# Patient Record
Sex: Male | Born: 1935 | Race: White | Hispanic: No | Marital: Single | Smoking: Never smoker
Health system: Southern US, Community
[De-identification: ages and names within clinical notes are randomized; demographics above are authoritative.]

## PROBLEM LIST (undated history)

## (undated) DIAGNOSIS — R7303 Prediabetes: Secondary | ICD-10-CM

## (undated) DIAGNOSIS — C4491 Basal cell carcinoma of skin, unspecified: Secondary | ICD-10-CM

## (undated) DIAGNOSIS — M2669 Other specified disorders of temporomandibular joint: Secondary | ICD-10-CM

## (undated) DIAGNOSIS — J189 Pneumonia, unspecified organism: Secondary | ICD-10-CM

## (undated) DIAGNOSIS — C099 Malignant neoplasm of tonsil, unspecified: Secondary | ICD-10-CM

## (undated) DIAGNOSIS — J309 Allergic rhinitis, unspecified: Secondary | ICD-10-CM

## (undated) DIAGNOSIS — Z8719 Personal history of other diseases of the digestive system: Secondary | ICD-10-CM

## (undated) DIAGNOSIS — H409 Unspecified glaucoma: Secondary | ICD-10-CM

## (undated) DIAGNOSIS — Z923 Personal history of irradiation: Secondary | ICD-10-CM

## (undated) DIAGNOSIS — H3530 Unspecified macular degeneration: Secondary | ICD-10-CM

## (undated) DIAGNOSIS — Z87898 Personal history of other specified conditions: Secondary | ICD-10-CM

## (undated) DIAGNOSIS — L589 Radiodermatitis, unspecified: Secondary | ICD-10-CM

## (undated) DIAGNOSIS — C4492 Squamous cell carcinoma of skin, unspecified: Secondary | ICD-10-CM

## (undated) DIAGNOSIS — N529 Male erectile dysfunction, unspecified: Secondary | ICD-10-CM

## (undated) DIAGNOSIS — M51379 Other intervertebral disc degeneration, lumbosacral region without mention of lumbar back pain or lower extremity pain: Secondary | ICD-10-CM

## (undated) DIAGNOSIS — I10 Essential (primary) hypertension: Secondary | ICD-10-CM

## (undated) DIAGNOSIS — M5030 Other cervical disc degeneration, unspecified cervical region: Secondary | ICD-10-CM

## (undated) DIAGNOSIS — R911 Solitary pulmonary nodule: Secondary | ICD-10-CM

## (undated) DIAGNOSIS — C679 Malignant neoplasm of bladder, unspecified: Secondary | ICD-10-CM

## (undated) DIAGNOSIS — M1990 Unspecified osteoarthritis, unspecified site: Secondary | ICD-10-CM

## (undated) DIAGNOSIS — Z9889 Other specified postprocedural states: Secondary | ICD-10-CM

## (undated) DIAGNOSIS — R4702 Dysphasia: Secondary | ICD-10-CM

## (undated) DIAGNOSIS — N400 Enlarged prostate without lower urinary tract symptoms: Secondary | ICD-10-CM

## (undated) DIAGNOSIS — M5137 Other intervertebral disc degeneration, lumbosacral region: Secondary | ICD-10-CM

## (undated) DIAGNOSIS — R600 Localized edema: Secondary | ICD-10-CM

## (undated) DIAGNOSIS — D630 Anemia in neoplastic disease: Secondary | ICD-10-CM

## (undated) DIAGNOSIS — I872 Venous insufficiency (chronic) (peripheral): Secondary | ICD-10-CM

## (undated) DIAGNOSIS — I712 Thoracic aortic aneurysm, without rupture: Secondary | ICD-10-CM

## (undated) DIAGNOSIS — Z22322 Carrier or suspected carrier of Methicillin resistant Staphylococcus aureus: Secondary | ICD-10-CM

## (undated) DIAGNOSIS — Z87442 Personal history of urinary calculi: Secondary | ICD-10-CM

## (undated) DIAGNOSIS — C062 Malignant neoplasm of retromolar area: Secondary | ICD-10-CM

## (undated) DIAGNOSIS — F1010 Alcohol abuse, uncomplicated: Secondary | ICD-10-CM

## (undated) DIAGNOSIS — C9110 Chronic lymphocytic leukemia of B-cell type not having achieved remission: Secondary | ICD-10-CM

## (undated) HISTORY — PX: HERNIA REPAIR: SHX51

## (undated) HISTORY — PX: CATARACT EXTRACTION W/ INTRAOCULAR LENS  IMPLANT, BILATERAL: SHX1307

## (undated) HISTORY — PX: BACK SURGERY: SHX140

## (undated) HISTORY — DX: Personal history of other specified conditions: Z87.898

## (undated) HISTORY — PX: EYE SURGERY: SHX253

## (undated) HISTORY — DX: Unspecified glaucoma: H40.9

## (undated) HISTORY — DX: Thoracic aortic aneurysm, without rupture: I71.2

## (undated) HISTORY — PX: INGUINAL HERNIA REPAIR: SUR1180

## (undated) HISTORY — PX: OTHER SURGICAL HISTORY: SHX169

## (undated) HISTORY — PX: MAXILLECTOMY: SUR858

---
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
## 2000-03-04 ENCOUNTER — Encounter: Payer: Self-pay | Admitting: Internal Medicine

## 2000-11-03 ENCOUNTER — Encounter: Payer: Self-pay | Admitting: Ophthalmology

## 2000-11-03 ENCOUNTER — Ambulatory Visit (HOSPITAL_COMMUNITY): Admission: RE | Admit: 2000-11-03 | Discharge: 2000-11-04 | Payer: Self-pay | Admitting: Ophthalmology

## 2000-11-03 HISTORY — PX: RETINAL DETACHMENT REPAIR W/ SCLERAL BUCKLE LE: SHX2338

## 2000-12-11 ENCOUNTER — Inpatient Hospital Stay (HOSPITAL_COMMUNITY): Admission: RE | Admit: 2000-12-11 | Discharge: 2000-12-12 | Payer: Self-pay | Admitting: Specialist

## 2000-12-11 HISTORY — PX: ROTATOR CUFF REPAIR: SHX139

## 2003-05-08 ENCOUNTER — Encounter: Admission: RE | Admit: 2003-05-08 | Discharge: 2003-05-08 | Payer: Self-pay | Admitting: Specialist

## 2003-06-07 ENCOUNTER — Ambulatory Visit: Admission: RE | Admit: 2003-06-07 | Discharge: 2003-06-07 | Payer: Self-pay | Admitting: Specialist

## 2003-06-08 ENCOUNTER — Encounter: Admission: RE | Admit: 2003-06-08 | Discharge: 2003-06-08 | Payer: Self-pay | Admitting: Specialist

## 2003-06-21 ENCOUNTER — Encounter: Admission: RE | Admit: 2003-06-21 | Discharge: 2003-06-21 | Payer: Self-pay | Admitting: Internal Medicine

## 2003-07-10 ENCOUNTER — Ambulatory Visit (HOSPITAL_COMMUNITY): Admission: RE | Admit: 2003-07-10 | Discharge: 2003-07-10 | Payer: Self-pay | Admitting: Internal Medicine

## 2003-07-10 ENCOUNTER — Encounter: Payer: Self-pay | Admitting: Internal Medicine

## 2003-07-26 ENCOUNTER — Ambulatory Visit (HOSPITAL_COMMUNITY): Admission: RE | Admit: 2003-07-26 | Discharge: 2003-07-26 | Payer: Self-pay | Admitting: Internal Medicine

## 2003-07-26 ENCOUNTER — Encounter (INDEPENDENT_AMBULATORY_CARE_PROVIDER_SITE_OTHER): Payer: Self-pay | Admitting: *Deleted

## 2003-09-01 ENCOUNTER — Inpatient Hospital Stay (HOSPITAL_COMMUNITY): Admission: RE | Admit: 2003-09-01 | Discharge: 2003-09-05 | Payer: Self-pay | Admitting: Specialist

## 2003-09-01 HISTORY — PX: LAMINECTOMY WITH POSTERIOR LATERAL ARTHRODESIS LEVEL 1: SHX6335

## 2003-09-08 ENCOUNTER — Inpatient Hospital Stay (HOSPITAL_COMMUNITY): Admission: EM | Admit: 2003-09-08 | Discharge: 2003-09-12 | Payer: Self-pay

## 2003-09-12 ENCOUNTER — Encounter: Payer: Self-pay | Admitting: Internal Medicine

## 2004-08-19 ENCOUNTER — Ambulatory Visit: Payer: Self-pay | Admitting: Internal Medicine

## 2005-02-17 ENCOUNTER — Ambulatory Visit: Payer: Self-pay | Admitting: Internal Medicine

## 2005-05-03 ENCOUNTER — Ambulatory Visit: Payer: Self-pay | Admitting: Internal Medicine

## 2005-05-20 ENCOUNTER — Ambulatory Visit: Payer: Self-pay | Admitting: Internal Medicine

## 2005-09-17 ENCOUNTER — Ambulatory Visit: Payer: Self-pay | Admitting: Internal Medicine

## 2006-03-11 ENCOUNTER — Ambulatory Visit: Payer: Self-pay | Admitting: Internal Medicine

## 2006-04-17 ENCOUNTER — Ambulatory Visit: Payer: Self-pay | Admitting: Internal Medicine

## 2006-09-09 ENCOUNTER — Ambulatory Visit: Payer: Self-pay | Admitting: Internal Medicine

## 2007-03-03 DIAGNOSIS — E119 Type 2 diabetes mellitus without complications: Secondary | ICD-10-CM | POA: Insufficient documentation

## 2007-03-03 DIAGNOSIS — I10 Essential (primary) hypertension: Secondary | ICD-10-CM | POA: Insufficient documentation

## 2007-03-03 DIAGNOSIS — N400 Enlarged prostate without lower urinary tract symptoms: Secondary | ICD-10-CM

## 2007-03-03 DIAGNOSIS — K219 Gastro-esophageal reflux disease without esophagitis: Secondary | ICD-10-CM

## 2007-03-09 ENCOUNTER — Encounter: Payer: Self-pay | Admitting: Internal Medicine

## 2007-03-10 ENCOUNTER — Ambulatory Visit: Payer: Self-pay | Admitting: Internal Medicine

## 2007-04-02 ENCOUNTER — Ambulatory Visit: Payer: Self-pay | Admitting: Internal Medicine

## 2007-06-09 ENCOUNTER — Ambulatory Visit: Payer: Self-pay | Admitting: Internal Medicine

## 2007-06-09 DIAGNOSIS — M1990 Unspecified osteoarthritis, unspecified site: Secondary | ICD-10-CM | POA: Insufficient documentation

## 2007-06-09 DIAGNOSIS — M545 Low back pain, unspecified: Secondary | ICD-10-CM | POA: Insufficient documentation

## 2007-06-09 DIAGNOSIS — N529 Male erectile dysfunction, unspecified: Secondary | ICD-10-CM | POA: Insufficient documentation

## 2007-10-08 ENCOUNTER — Ambulatory Visit: Payer: Self-pay | Admitting: Internal Medicine

## 2007-10-25 ENCOUNTER — Encounter: Payer: Self-pay | Admitting: Internal Medicine

## 2008-02-09 ENCOUNTER — Ambulatory Visit: Payer: Self-pay | Admitting: Internal Medicine

## 2008-02-09 DIAGNOSIS — J309 Allergic rhinitis, unspecified: Secondary | ICD-10-CM | POA: Insufficient documentation

## 2008-04-04 ENCOUNTER — Ambulatory Visit: Payer: Self-pay | Admitting: Internal Medicine

## 2008-08-09 ENCOUNTER — Ambulatory Visit: Payer: Self-pay | Admitting: Internal Medicine

## 2008-08-09 LAB — CONVERTED CEMR LAB
ALT: 27 units/L (ref 0–53)
AST: 32 units/L (ref 0–37)
Albumin: 3.8 g/dL (ref 3.5–5.2)
Alkaline Phosphatase: 61 units/L (ref 39–117)
BUN: 16 mg/dL (ref 6–23)
Basophils Absolute: 0.1 10*3/uL (ref 0.0–0.1)
Basophils Relative: 0.8 % (ref 0.0–3.0)
Bilirubin, Direct: 0.3 mg/dL (ref 0.0–0.3)
CO2: 33 meq/L — ABNORMAL HIGH (ref 19–32)
Calcium: 9.6 mg/dL (ref 8.4–10.5)
Chloride: 102 meq/L (ref 96–112)
Cholesterol: 189 mg/dL (ref 0–200)
Creatinine, Ser: 0.8 mg/dL (ref 0.4–1.5)
Eosinophils Absolute: 0.4 10*3/uL (ref 0.0–0.7)
Eosinophils Relative: 4 % (ref 0.0–5.0)
GFR calc Af Amer: 121 mL/min
GFR calc non Af Amer: 100 mL/min
Glucose, Bld: 109 mg/dL — ABNORMAL HIGH (ref 70–99)
HCT: 45.9 % (ref 39.0–52.0)
HDL: 66.5 mg/dL (ref >39.0)
Hemoglobin: 15.8 g/dL (ref 13.0–17.0)
Hgb A1c MFr Bld: 5.7 % (ref 4.6–6.0)
LDL Cholesterol: 115 mg/dL — ABNORMAL HIGH (ref 0–99)
Lymphocytes Relative: 50.2 % — ABNORMAL HIGH (ref 12.0–46.0)
MCHC: 34.5 g/dL (ref 30.0–36.0)
MCV: 99.5 fL (ref 78.0–100.0)
Monocytes Absolute: 0.9 10*3/uL (ref 0.1–1.0)
Monocytes Relative: 8.4 % (ref 3.0–12.0)
Neutro Abs: 3.7 10*3/uL (ref 1.4–7.7)
Neutrophils Relative %: 36.6 % — ABNORMAL LOW (ref 43.0–77.0)
PSA: 0.55 ng/mL (ref 0.10–4.00)
Platelets: 153 10*3/uL (ref 150–400)
Potassium: 4.5 meq/L (ref 3.5–5.1)
RBC: 4.62 M/uL (ref 4.22–5.81)
RDW: 12.5 % (ref 11.5–14.6)
Sodium: 143 meq/L (ref 135–145)
TSH: 1.07 microintl units/mL (ref 0.35–5.50)
Total Bilirubin: 1.3 mg/dL — ABNORMAL HIGH (ref 0.3–1.2)
Total CHOL/HDL Ratio: 2.8
Total Protein: 7 g/dL (ref 6.0–8.3)
Triglycerides: 37 mg/dL (ref 0–149)
VLDL: 7 mg/dL (ref 0–40)
WBC: 10.2 10*3/uL (ref 4.5–10.5)

## 2008-12-04 ENCOUNTER — Encounter: Payer: Self-pay | Admitting: Internal Medicine

## 2008-12-05 ENCOUNTER — Ambulatory Visit: Payer: Self-pay | Admitting: Internal Medicine

## 2009-03-22 ENCOUNTER — Ambulatory Visit: Payer: Self-pay | Admitting: Internal Medicine

## 2009-06-05 ENCOUNTER — Ambulatory Visit: Payer: Self-pay | Admitting: Internal Medicine

## 2009-06-05 LAB — CONVERTED CEMR LAB: Hgb A1c MFr Bld: 5.6 % (ref 4.6–6.5)

## 2009-12-12 ENCOUNTER — Ambulatory Visit: Payer: Self-pay | Admitting: Internal Medicine

## 2009-12-12 LAB — HM DIABETES FOOT EXAM

## 2010-03-14 ENCOUNTER — Ambulatory Visit: Payer: Self-pay | Admitting: Internal Medicine

## 2010-05-03 ENCOUNTER — Encounter: Payer: Self-pay | Admitting: Internal Medicine

## 2010-05-22 ENCOUNTER — Encounter: Admission: RE | Admit: 2010-05-22 | Discharge: 2010-05-22 | Payer: Self-pay | Admitting: Neurology

## 2010-06-13 ENCOUNTER — Ambulatory Visit: Payer: Self-pay | Admitting: Internal Medicine

## 2010-06-13 LAB — CONVERTED CEMR LAB: Blood Glucose, Fingerstick: 113

## 2010-07-03 ENCOUNTER — Encounter: Payer: Self-pay | Admitting: Internal Medicine

## 2010-07-21 LAB — CONVERTED CEMR LAB
ALT: 21 units/L (ref 0–53)
ALT: 30 units/L (ref 0–53)
AST: 29 units/L (ref 0–37)
AST: 42 units/L — ABNORMAL HIGH (ref 0–37)
Albumin: 3.8 g/dL (ref 3.5–5.2)
Albumin: 4.1 g/dL (ref 3.5–5.2)
Alkaline Phosphatase: 57 units/L (ref 39–117)
Alkaline Phosphatase: 60 units/L (ref 39–117)
BUN: 16 mg/dL (ref 6–23)
BUN: 8 mg/dL (ref 6–23)
Basophils Absolute: 0 10*3/uL (ref 0.0–0.1)
Basophils Absolute: 0.1 10*3/uL (ref 0.0–0.1)
Basophils Relative: 0 % (ref 0.0–1.0)
Basophils Relative: 1 % (ref 0–1)
Bilirubin, Direct: 0.2 mg/dL (ref 0.0–0.3)
Bilirubin, Direct: 0.2 mg/dL (ref 0.0–0.3)
CO2: 32 meq/L (ref 19–32)
CO2: 33 meq/L — ABNORMAL HIGH (ref 19–32)
Calcium: 9.3 mg/dL (ref 8.4–10.5)
Calcium: 9.3 mg/dL (ref 8.4–10.5)
Chloride: 103 meq/L (ref 96–112)
Chloride: 104 meq/L (ref 96–112)
Cholesterol: 173 mg/dL (ref 0–200)
Cholesterol: 178 mg/dL (ref 0–200)
Creatinine, Ser: 0.9 mg/dL (ref 0.4–1.5)
Creatinine, Ser: 1 mg/dL (ref 0.4–1.5)
Eosinophils Absolute: 0.3 10*3/uL (ref 0.0–0.6)
Eosinophils Absolute: 0.4 10*3/uL (ref 0.0–0.7)
Eosinophils Relative: 3.5 % (ref 0.0–5.0)
Eosinophils Relative: 4 % (ref 0–5)
GFR calc Af Amer: 106 mL/min
GFR calc non Af Amer: 81.57 mL/min (ref >60)
GFR calc non Af Amer: 87 mL/min
Glucose, Bld: 101 mg/dL — ABNORMAL HIGH (ref 70–99)
Glucose, Bld: 103 mg/dL — ABNORMAL HIGH (ref 70–99)
HCT: 43.2 % (ref 39.0–52.0)
HCT: 44.1 % (ref 39.0–52.0)
HDL: 67.2 mg/dL (ref >39.0)
HDL: 69.2 mg/dL (ref >39.00)
Hemoglobin: 14.7 g/dL (ref 13.0–17.0)
Hemoglobin: 15.4 g/dL (ref 13.0–17.0)
Hgb A1c MFr Bld: 5.6 % (ref 4.6–6.0)
Hgb A1c MFr Bld: 5.8 % (ref 4.6–6.5)
LDL Cholesterol: 101 mg/dL — ABNORMAL HIGH (ref 0–99)
LDL Cholesterol: 97 mg/dL (ref 0–99)
Lymphocytes Relative: 59 % — ABNORMAL HIGH (ref 12.0–46.0)
Lymphocytes Relative: 60 % — ABNORMAL HIGH (ref 12–46)
Lymphs Abs: 6.5 10*3/uL — ABNORMAL HIGH (ref 0.7–4.0)
MCHC: 34 g/dL (ref 30.0–36.0)
MCHC: 34.9 g/dL (ref 30.0–36.0)
MCV: 100.9 fL — ABNORMAL HIGH (ref 78.0–100.0)
MCV: 98.6 fL (ref 78.0–100.0)
Monocytes Absolute: 0.6 10*3/uL (ref 0.2–0.7)
Monocytes Absolute: 0.8 10*3/uL (ref 0.1–1.0)
Monocytes Relative: 6.9 % (ref 3.0–11.0)
Monocytes Relative: 8 % (ref 3–12)
Neutro Abs: 2.5 10*3/uL (ref 1.4–7.7)
Neutro Abs: 3.1 10*3/uL (ref 1.7–7.7)
Neutrophils Relative %: 29 % — ABNORMAL LOW (ref 43–77)
Neutrophils Relative %: 30.6 % — ABNORMAL LOW (ref 43.0–77.0)
PSA: 0.7 ng/mL (ref 0.10–4.00)
PSA: 0.77 ng/mL (ref 0.10–4.00)
Platelets: 169 10*3/uL (ref 150–400)
Platelets: 174 10*3/uL (ref 150–400)
Potassium: 4.2 meq/L (ref 3.5–5.1)
Potassium: 4.3 meq/L (ref 3.5–5.1)
RBC: 4.28 M/uL (ref 4.22–5.81)
RBC: 4.48 M/uL (ref 4.22–5.81)
RDW: 13 % (ref 11.5–14.6)
RDW: 14 % (ref 11.5–15.5)
Sodium: 142 meq/L (ref 135–145)
Sodium: 143 meq/L (ref 135–145)
TSH: 1.14 microintl units/mL (ref 0.35–5.50)
TSH: 2.37 microintl units/mL (ref 0.35–5.50)
Testosterone: 646.52 ng/dL (ref 350.00–890)
Total Bilirubin: 1.2 mg/dL (ref 0.3–1.2)
Total Bilirubin: 1.3 mg/dL — ABNORMAL HIGH (ref 0.3–1.2)
Total CHOL/HDL Ratio: 2.6
Total CHOL/HDL Ratio: 3
Total Protein: 6.7 g/dL (ref 6.0–8.3)
Total Protein: 7 g/dL (ref 6.0–8.3)
Triglycerides: 40 mg/dL (ref 0.0–149.0)
Triglycerides: 43 mg/dL (ref 0–149)
VLDL: 8 mg/dL (ref 0.0–40.0)
VLDL: 9 mg/dL (ref 0–40)
WBC: 10.8 10*3/uL — ABNORMAL HIGH (ref 4.0–10.5)
WBC: 8.3 10*3/uL (ref 4.5–10.5)

## 2010-07-23 NOTE — Assessment & Plan Note (Signed)
Summary: FLU SHOT//CCM  Nurse Visit   Allergies: No Known Drug Allergies  Orders Added: 1)  Flu Vaccine 3yrs + MEDICARE PATIENTS [Q2039] 2)  Administration Flu vaccine - MCR [G0008] Flu Vaccine Consent Questions     Do you have a history of severe allergic reactions to this vaccine? no    Any prior history of allergic reactions to egg and/or gelatin? no    Do you have a sensitivity to the preservative Thimersol? no    Do you have a past history of Guillan-Barre Syndrome? no    Do you currently have an acute febrile illness? no    Have you ever had a severe reaction to latex? no    Vaccine information given and explained to patient? yes    Are you currently pregnant? no    Lot Number:[id number]   Exp Date:12/21/2010   Site Given  Left Deltoid IM.lbmedflu 

## 2010-07-23 NOTE — Assessment & Plan Note (Signed)
Summary: pt will come in fasting/njr   Vital Signs:  Patient profile:   77 year old male Height:      68 inches Weight:      176 pounds BMI:     26.86 Temp:     97.7 degrees F oral BP sitting:   120 / 80  (right arm) Cuff size:   regular  Vitals Entered By: Duard Brady LPN (December 12, 2009 8:54 AM) CC: cpx - doing well  Is Patient Diabetic? No   CC:  cpx - doing well .  History of Present Illness: 77 year old patient who is seen today for a comprehensive evaluation.  He has a history of treated hypertension.  He has additionally, gastroesophageal reflux disease, and a history of diet-controlled diabetes.  He has osteoarthritis and chronic low back pain.  He has quite well except for his chronic low back pain. Here for Medicare AWV:  1.   Risk factors based on Past M, S, F history:  cardiovascular  risk factors include a history of hypertension and diet controlled diabetes 2.   Physical Activities: no rigorous exercise activities, but is active with yard work 3.   Depression/mood: no history of depression 4.   Hearing: no hearing deficits 5.   ADL's: completely active in all aspects of daily living 6.   Fall Risk: low 7.   Home Safety: no problems identified 8.   Height, weight, &visual acuity:height and weight are stable.  Has been recently diagnosed with glaucoma and is followed closely by ophthalmology 9.   Counseling: modest weight loss encouraged.  Screening colonoscopy.  Discussed 10.   Labs ordered based on risk factors: complete laboratory profile, including lipid panel, PSA will be reviewed 11.           Referral Coordination- will follow-up with his orthopedic physician due to his low back pain 12.           Care Plan- heart healthy diet modest weight loss and exercise regimen discussed.  Screening colonoscopy.  Also discussed 13.            Cognitive Assessment- alert and oriented, with normal affect   Preventive Screening-Counseling &  Management  Alcohol-Tobacco     Smoking Status: never  Allergies (verified): No Known Drug Allergies  Past History:  Past Medical History: Diabetes mellitus, type II GERD Hypertension Benign prostatic hypertrophy Low back pain Osteoarthritis history of EtOH Allergic rhinitis Glaucoma R Eye ([facility])  Past Surgical History: Inguinal herniorrhaphy x 2 Rotator cuff repair 2002 Flexible Sigmoidoscopy 20 yrs ago status post laminectomy March 2005 history retinal detachment Cataract extraction  Family History: Reviewed history from 12/05/2008 and no changes required. Family History of Alcoholism/Addiction Family History of Colon CA 1st degree relative <60 Family History Hypertension Family History of Sudden Death Father died age 24-unclear causes Mother died age 18 Lung Ca  One brother age 57-DJD One Sister died age 23- ETOH  Social History: Reviewed history from 12/05/2008 and no changes required. Retired Single Regular exercise-no Never Smoked MOD to heavy 2-3 per night  Review of Systems  The patient denies anorexia, fever, weight loss, weight gain, vision loss, decreased hearing, hoarseness, chest pain, syncope, dyspnea on exertion, peripheral edema, prolonged cough, headaches, hemoptysis, abdominal pain, melena, hematochezia, severe indigestion/heartburn, hematuria, incontinence, genital sores, muscle weakness, suspicious skin lesions, transient blindness, difficulty walking, depression, unusual weight change, abnormal bleeding, enlarged lymph nodes, angioedema, breast masses, and testicular masses.    Physical Exam  General:  overweight-appearing.  126/80 Head:  Normocephalic and atraumatic without obvious abnormalities. No apparent alopecia or balding. Eyes:  No corneal or conjunctival inflammation noted. EOMI. Perrla. Funduscopic exam benign, without hemorrhages, exudates or papilledema. Vision grossly normal. Ears:  External ear exam shows  no significant lesions or deformities.  Otoscopic examination reveals clear canals, tympanic membranes are intact bilaterally without bulging, retraction, inflammation or discharge. Hearing is grossly normal bilaterally. Nose:  External nasal examination shows no deformity or inflammation. Nasal mucosa are pink and moist without lesions or exudates. Mouth:  Oral mucosa and oropharynx without lesions or exudates.  Teeth in good repair. Neck:  No deformities, masses, or tenderness noted. Chest Wall:  No deformities, masses, tenderness or gynecomastia noted. Breasts:  No masses or gynecomastia noted Lungs:  Normal respiratory effort, chest expands symmetrically. Lungs are clear to auscultation, no crackles or wheezes. Heart:  Normal rate and regular rhythm. S1 and S2 normal without gallop, murmur, click, rub or other extra sounds. Abdomen:  Bowel sounds positive,abdomen soft and non-tender without masses, organomegaly or hernias noted. Rectal:  No external abnormalities noted. Normal sphincter tone. No rectal masses or tenderness. Genitalia:  Testes bilaterally descended without nodularity, tenderness or masses. No scrotal masses or lesions. No penis lesions or urethral discharge. Prostate:  2+ enlarged.  2+ enlarged.   Msk:  No deformity or scoliosis noted of thoracic or lumbar spine.   Pulses:  R and L carotid,radial,femoral,dorsalis pedis and posterior tibial pulses are full and equal bilaterally Extremities:  No clubbing, cyanosis, edema, or deformity noted with normal full range of motion of all joints.   Neurologic:  No cranial nerve deficits noted. Station and gait are normal. Plantar reflexes are down-going bilaterally. DTRs are symmetrical throughout.  absent vibratory sensation distal to the knees. Skin:  Intact without suspicious lesions or rashes Cervical Nodes:  No lymphadenopathy noted Axillary Nodes:  No palpable lymphadenopathy Inguinal Nodes:  No significant adenopathy Psych:   Cognition and judgment appear intact. Alert and cooperative with normal attention span and concentration. No apparent delusions, illusions, hallucinations  Diabetes Management Exam:    Foot Exam (with socks and/or shoes not present):       Sensory-Pinprick/Light touch:          Left medial foot (L-4): diminished          Left dorsal foot (L-5): diminished          Left lateral foot (S-1): diminished          Right medial foot (L-4): diminished          Right dorsal foot (L-5): diminished          Right lateral foot (S-1): diminished       Sensory-Monofilament:          Left foot: normal          Right foot: normal       Sensory-other: absent vibratory sensation distal to the knees       Inspection:          Left foot: normal          Right foot: normal       Nails:          Left foot: normal          Right foot: normal    Foot Exam by Podiatrist:       Date: 12/12/2009       Results: mild diabetic findings  Done by: PCP   Impression & Recommendations:  Problem # 1:  HEALTH SCREENING (ICD-V70.0)  Orders: First annual wellness visit with prevention plan  (Z6109) Venipuncture (60454) TLB-Lipid Panel (80061-LIPID) TLB-BMP (Basic Metabolic Panel-BMET) (80048-METABOL) TLB-CBC Platelet - w/Differential (85025-CBCD) TLB-Hepatic/Liver Function Pnl (80076-HEPATIC) TLB-TSH (Thyroid Stimulating Hormone) (84443-TSH) TLB-PSA (Prostate Specific Antigen) (84153-PSA)  Complete Medication List: 1)  Cialis 20 Mg Tabs (Tadalafil) .... As dir 2)  Metoprolol Succinate 100 Mg Tb24 (Metoprolol succinate) .Marland Kitchen.. 1 once daily 3)  Eq Aspirin 500 Mg Tbec (Aspirin) .... Two qam 4)  Benazepril-hydrochlorothiazide 20-12.5 Mg Tabs (Benazepril-hydrochlorothiazide) .Marland Kitchen.. 1 once daily 5)  Fish Oil Oil (Fish oil) .... Two qam 6)  Fluticasone Propionate 50 Mcg/act Susp (Fluticasone propionate) .... Use daily 7)  Famvir 125 Mg Tabs (Famciclovir) .... Qd 8)  Combigan 0.2-0.5 % Soln (Brimonidine  tartrate-timolol) .... Once daily (r) eye 9)  Prednisolone Acetate 1 % Susp (Prednisolone acetate) .... Once daily (r) eye  Other Orders: EKG w/ Interpretation (93000) TLB-A1C / Hgb A1C (Glycohemoglobin) (83036-A1C)  Patient Instructions: 1)  Please schedule a follow-up appointment in 6 months. 2)  Limit your Sodium (Salt). 3)  It is important that you exercise regularly at least 20 minutes 5 times a week. If you develop chest pain, have severe difficulty breathing, or feel very tired , stop exercising immediately and seek medical attention. 4)  You need to lose weight. Consider a lower calorie diet and regular exercise.  5)  Schedule a colonoscopy/sigmoidoscopy to help detect colon cancer. 6)  It is important that your Diabetic A1c level is checked every 3 months. 7)  Check your Blood Pressure regularly. If it is above: 150/90 you should make an appointment. Prescriptions: METOPROLOL SUCCINATE 100 MG  TB24 (METOPROLOL SUCCINATE) 1 once daily  #90 Tablet x 6   Entered and Authorized by:   Gordy Savers  MD   Signed by:   Gordy Savers  MD on 12/12/2009   Method used:   Electronically to        [facility]  [phone]* (retail)       [address] [place], [place]  [address]       Ph: [phone] or [phone]       Fax: [phone]   RxID:   [id number] FLUTICASONE PROPIONATE 50 MCG/ACT  SUSP (FLUTICASONE PROPIONATE) use daily  #3 x 6   Entered and Authorized by:   Gordy Savers  MD   Signed by:   Gordy Savers  MD on 12/12/2009   Method used:   Electronically to        [facility]  [phone]* (retail)       [address] [place], [place]  [address]       Ph: [phone] or [phone]       Fax: [phone]   RxID:   [id number] BENAZEPRIL-HYDROCHLOROTHIAZIDE 20-12.5 MG  TABS (BENAZEPRIL-HYDROCHLOROTHIAZIDE) 1 once daily  #90 Tablet x 6   Entered and Authorized by:   Gordy Savers  MD   Signed by:   Gordy Savers  MD on 12/12/2009   Method used:   Electronically to        [facility]  [phone]* (retail)       [address] [place], [place]  [address]       Ph: [phone] or [phone]  Fax: [phone]   RxID:   [id number] CIALIS 20 MG TABS (TADALAFIL) as dir  #6 x 12   Entered and Authorized by:   Gordy Savers  MD   Signed by:   Gordy Savers  MD on 12/12/2009   Method used:   Electronically to        [facility]  [phone]* (retail)       [address] [place], [place]  [address]       Ph: [phone] or [phone]       Fax: [phone]   RxID:   [id number]

## 2010-07-23 NOTE — Letter (Signed)
Summary: [facility]  [facility]   Imported By: Maryln Gottron 05/22/2010 14:13:01  _____________________________________________________________________  External Attachment:    Type:   Image     Comment:   External Document

## 2010-07-25 NOTE — Assessment & Plan Note (Signed)
Summary: 6 month follow up/cjr pt rsc/njr   Vital Signs:  Patient profile:   77 year old male Weight:      175 pounds Temp:     97.5 degrees F oral BP sitting:   110 / 70  (right arm) Cuff size:   regular  Vitals Entered By: Duard Brady LPN (June 13, 2010 10:49 AM) CC: 6 mos rov - doing well CBG Result 113   CC:  6 mos rov - doing well.  History of Present Illness: 77 -year-old patient who is seen today for follow-up.  He has a history of osteoarthritis and chronic low back pain.  This is his only complaint.  He has a history of impaired glucose tolerance treated hypertension and gastroesophageal reflux disease.  He has had a recent neurological evaluation due to ptosis involving the right eye.  This was negative.  Allergies: No Known Drug Allergies  Past History:  Past Medical History: Reviewed history from 12/12/2009 and no changes required. Diabetes mellitus, type II GERD Hypertension Benign prostatic hypertrophy Low back pain Osteoarthritis history of EtOH Allergic rhinitis Glaucoma R Eye ([facility])  Past Surgical History: Reviewed history from 12/12/2009 and no changes required. Inguinal herniorrhaphy x 2 Rotator cuff repair 2002 Flexible Sigmoidoscopy 20 yrs ago status post laminectomy March 2005 history retinal detachment Cataract extraction  Review of Systems       The patient complains of difficulty walking.  The patient denies anorexia, fever, weight loss, weight gain, vision loss, decreased hearing, hoarseness, chest pain, syncope, dyspnea on exertion, peripheral edema, prolonged cough, headaches, hemoptysis, abdominal pain, melena, hematochezia, severe indigestion/heartburn, hematuria, incontinence, genital sores, muscle weakness, suspicious skin lesions, transient blindness, depression, unusual weight change, abnormal bleeding, enlarged lymph nodes, angioedema, breast masses, and testicular masses.    Physical Exam  General:   overweight-appearing.  normal blood pressureoverweight-appearing.   Head:  Normocephalic and atraumatic without obvious abnormalities. No apparent alopecia or balding. Eyes:  No corneal or conjunctival inflammation noted. EOMI. Perrla. Funduscopic exam benign, without hemorrhages, exudates or papilledema. Vision grossly normal.  mild ptosis, right eye Mouth:  Oral mucosa and oropharynx without lesions or exudates.  Teeth in good repair. Neck:  No deformities, masses, or tenderness noted. Lungs:  Normal respiratory effort, chest expands symmetrically. Lungs are clear to auscultation, no crackles or wheezes. Heart:  Normal rate and regular rhythm. S1 and S2 normal without gallop, murmur, click, rub or other extra sounds. Abdomen:  Bowel sounds positive,abdomen soft and non-tender without masses, organomegaly or hernias noted. Msk:  No deformity or scoliosis noted of thoracic or lumbar spine.   Extremities:  No clubbing, cyanosis, edema, or deformity noted with normal full range of motion of all joints.    Diabetes Management Exam:    Eye Exam:       Eye Exam done here today          Results: normal   Impression & Recommendations:  Problem # 1:  OSTEOARTHRITIS (ICD-715.90)  His updated medication list for this problem includes:    Eq Aspirin 500 Mg Tbec (Aspirin) .Marland Kitchen..Marland Kitchen Two qam  His updated medication list for this problem includes:    Eq Aspirin 500 Mg Tbec (Aspirin) .Marland Kitchen..Marland Kitchen Two qam  Problem # 2:  LOW BACK PAIN (ICD-724.2)  His updated medication list for this problem includes:    Eq Aspirin 500 Mg Tbec (Aspirin) .Marland Kitchen..Marland Kitchen Two qam  His updated medication list for this problem includes:    Eq Aspirin 500 Mg  Tbec (Aspirin) .Marland Kitchen..Marland Kitchen Two qam  Problem # 3:  HYPERTENSION (ICD-401.9)  His updated medication list for this problem includes:    Metoprolol Succinate 100 Mg Tb24 (Metoprolol succinate) .Marland Kitchen... 1 once daily    Benazepril-hydrochlorothiazide 20-12.5 Mg Tabs (Benazepril-hydrochlorothiazide)  .Marland Kitchen... 1 once daily  His updated medication list for this problem includes:    Metoprolol Succinate 100 Mg Tb24 (Metoprolol succinate) .Marland Kitchen... 1 once daily    Benazepril-hydrochlorothiazide 20-12.5 Mg Tabs (Benazepril-hydrochlorothiazide) .Marland Kitchen... 1 once daily  Problem # 4:  DIABETES MELLITUS, TYPE II (ICD-250.00)  His updated medication list for this problem includes:    Eq Aspirin 500 Mg Tbec (Aspirin) .Marland Kitchen..Marland Kitchen Two qam    Benazepril-hydrochlorothiazide 20-12.5 Mg Tabs (Benazepril-hydrochlorothiazide) .Marland Kitchen... 1 once daily  Orders: Capillary Blood Glucose/CBG (16109)  His updated medication list for this problem includes:    Eq Aspirin 500 Mg Tbec (Aspirin) .Marland Kitchen..Marland Kitchen Two qam    Benazepril-hydrochlorothiazide 20-12.5 Mg Tabs (Benazepril-hydrochlorothiazide) .Marland Kitchen... 1 once daily  Complete Medication List: 1)  Cialis 20 Mg Tabs (Tadalafil) .... As dir 2)  Metoprolol Succinate 100 Mg Tb24 (Metoprolol succinate) .Marland Kitchen.. 1 once daily 3)  Eq Aspirin 500 Mg Tbec (Aspirin) .... Two qam 4)  Benazepril-hydrochlorothiazide 20-12.5 Mg Tabs (Benazepril-hydrochlorothiazide) .Marland Kitchen.. 1 once daily 5)  Fish Oil Oil (Fish oil) .... Two qam 6)  Fluticasone Propionate 50 Mcg/act Susp (Fluticasone propionate) .... Use daily 7)  Famvir 125 Mg Tabs (Famciclovir) .... Qd 8)  Combigan 0.2-0.5 % Soln (Brimonidine tartrate-timolol) .... Once daily (r) eye 9)  Prednisolone Acetate 1 % Susp (Prednisolone acetate) .... Once daily (r) eye  Patient Instructions: 1)  Please schedule a follow-up appointment in 6 months for CPX  2)  Advised not to eat any food or drink any liquids after 10 PM the night before your procedure. 3)  Limit your Sodium (Salt). 4)  It is important that you exercise regularly at least 20 minutes 5 times a week. If you develop chest pain, have severe difficulty breathing, or feel very tired , stop exercising immediately and seek medical attention. 5)  You need to lose weight. Consider a lower calorie diet and  regular exercise.  Prescriptions: BENAZEPRIL-HYDROCHLOROTHIAZIDE 20-12.5 MG  TABS (BENAZEPRIL-HYDROCHLOROTHIAZIDE) 1 once daily  #90 Tablet x 6   Entered and Authorized by:   Gordy Savers  MD   Signed by:   Gordy Savers  MD on 06/13/2010   Method used:   Print then Give to Patient   RxID:   [id number] METOPROLOL SUCCINATE 100 MG  TB24 (METOPROLOL SUCCINATE) 1 once daily  #90 Tablet x 6   Entered and Authorized by:   Gordy Savers  MD   Signed by:   Gordy Savers  MD on 06/13/2010   Method used:   Print then Give to Patient   RxID:   [id number] CIALIS 20 MG TABS (TADALAFIL) as dir  #6 x 12   Entered and Authorized by:   Gordy Savers  MD   Signed by:   Gordy Savers  MD on 06/13/2010   Method used:   Print then Give to Patient   RxID:   [id number] BENAZEPRIL-HYDROCHLOROTHIAZIDE 20-12.5 MG  TABS (BENAZEPRIL-HYDROCHLOROTHIAZIDE) 1 once daily  #90 Tablet x 6   Entered and Authorized by:   Gordy Savers  MD   Signed by:   Gordy Savers  MD on 06/13/2010   Method used:   Electronically to        [facility]  #  [address] (retail)       [address] [place], [place]  [address]       Ph: [phone] or [phone]       Fax: [phone]   RxID:   [id number] METOPROLOL SUCCINATE 100 MG  TB24 (METOPROLOL SUCCINATE) 1 once daily  #90 Tablet x 6   Entered and Authorized by:   Gordy Savers  MD   Signed by:   Gordy Savers  MD on 06/13/2010   Method used:   Electronically to        [facility]  [phone]* (retail)       [address] [place], [place]  [address]       Ph: [phone] or [phone]       Fax: [phone]   RxID:   [id number] CIALIS 20 MG TABS (TADALAFIL) as dir  #6 x 12   Entered and Authorized by:   Gordy Savers  MD   Signed by:   Gordy Savers  MD on 06/13/2010   Method used:   Electronically to        [facility]  [phone]* (retail)        [address] [place], [place]  [address]       Ph: [phone] or [phone]       Fax: [phone]   RxID:   [id number]    Orders Added: 1)  Capillary Blood Glucose/CBG [25852] 2)  Est. Patient Level IV [77824]

## 2010-07-25 NOTE — Letter (Signed)
Summary: [facility]  [facility]   Imported By: Maryln Gottron 07/18/2010 13:09:06  _____________________________________________________________________  External Attachment:    Type:   Image     Comment:   External Document

## 2010-08-26 ENCOUNTER — Other Ambulatory Visit: Payer: Self-pay | Admitting: Internal Medicine

## 2010-08-26 DIAGNOSIS — I10 Essential (primary) hypertension: Secondary | ICD-10-CM

## 2010-11-08 NOTE — Consult Note (Signed)
NAME:  Timothy, Kim NO.:  [id number]   MEDICAL RECORD NO.:  [id number]                   PATIENT TYPE:  INP   LOCATION:  6715                                 FACILITY:  [facility]   PHYSICIAN:  Erasmo Leventhal, M.D.         DATE OF BIRTH:  [birth year]   DATE OF CONSULTATION:  09/09/2003  DATE OF DISCHARGE:                                   CONSULTATION   HISTORY:  Timothy Kim is a 77 year old Caucasian male status post lumbar  surgery per Dr. Otelia Sergeant on September 01, 2003.  He was discharged home on Tuesday  of last week.   Family noted on Wednesday, he was beginning to have shakes, and went into  DT's.  The family reports he had fallen between 3-5 times.  The patient was  admitted yesterday to the medical service for delirium tremens for DT  treatment.   I was asked to see the patient at the request of the family and physicians  for orthopedic follow up.  History was taken completely from the family;  being the son and daughter-in-law.   PAST MEDICAL HISTORY:  Well documented in the chart.   PHYSICAL EXAMINATION:  GENERAL:  He is sleepy but arousable. He is  disoriented. He only complains of back pain with movement. Denied any  problems with numbness or tingling.  ORTHOPEDIC:  His wound is examined, is healing well. There are no  complications or problems noted with the wound. He moves both legs, hips,  knees, feet and ankles very well.  Calf is soft, negative DVT exam.  Circulation is intact.   X-RAYS:  Ordered.   IMPRESSION:  Status post lumbar spine surgery postoperative day #8. At this  time, currently admitted for delirium tremens.   It is impossible to tell if there have been any problems with his surgery at  this time due to the fact that patient is disoriented. I believe as the  patient becomes more oriented, he can give [place] a better history. At that  time, the history and physical exams will be more appropriate.   For now, I see no gross  abnormalities or problems.  I will check some x-rays  and follow up on that. I would recommend a dry dressing on the wound,  bilateral knee-high TED hoses.  The heparin that he is currently receiving  is satisfactory but I would discontinue that when he is being mobilized out  of bed.   Further plans after x-rays. Will notify Dr. Otelia Sergeant on Monday of patient's  admission.                                               Erasmo Leventhal, M.D.   RAC/MEDQ  D:  09/09/2003  T:  09/10/2003  Job:  [id number]   cc:  [facility]

## 2010-11-08 NOTE — Op Note (Signed)
[facility]  Patient:    Timothy Kim, Timothy Kim                     MRN: [id number] Proc. Date: 11/03/00 Adm. Date:  [id number] Disc. Date: [id number] Attending:  Ivor Messier CC:         Doris Cheadle. Dione Booze, M.D.  Gordy Savers, M.D.   Operative Report  PREOPERATIVE DIAGNOSIS:  Rhegmatogenous retinal detachment, right eye.  POSTOPERATIVE DIAGNOSIS:  Rhegmatogenous retinal detachment, right eye.  OPERATION:  Scleral buckling procedure, right eye, using solid silicone implants, #279 and 240, cryoapplication, diathermy application, external drainage of subretinal fluid.  SURGEON:  Guadelupe Sabin, M.D.  ASSISTANT:  Nurse.  ANESTHESIA:  General.  OPHTHALMOSCOPY:  As previously described.  DESCRIPTION OF PROCEDURE:  After the patient was prepped and draped, traction sutures were placed in the right upper and lower lids.  A peritomy was performed adjacent to the limbus, 360 degrees.  The rectus muscles were isolated and looped with 4-0 silk traction sutures.  The sclera was inspected and felt to be of satisfactory thickness for lamellar scleral dissection. Location of the retinal tear was performed using the retinal cryoprobe.  The tear appeared as previously noted at the 9 oclock position.  The tear itself was treated with direct cryoapplication.  It was then elected to perform lamellar scleral dissection from the 11 oclock to 8 oclock position, the bed measuring 10 mm in width.  Light diathermy applications were applied to the intrascleral lamella.  A trimmed #279 solid silicone implant was placed in the bed and held in place with four 4-0 green Mersilene sutures.  A #240 solid silicone encircling band was placed about the globe, anchored at the 2 and 4 oclock positions with a 5-0 green Mersilene suture.  The band itself was tied at the 4 oclock position with 4-0 green Mersilene suture.  The patient was given Diamox intravenously,  500 mg, at the beginning of the procedure to lower the intraocular pressure.  After repeat indirect ophthalmoscopy, it was elected to drain fluid in the bed at the 8 oclock position.  Incision was made through the intrascleral lamella, the choroid exposed, treated with light diathermy and then perforated with the pen electrode.  An abundant amount of clear, slightly yellow-tinged fluid drained.  The scleral flaps were pulled up securely, creating a scleral buckling indentation.  The tension of the encircling band was also adjusted.  Indirect ophthalmoscopy revealed a good buckling indentation, good placement of the retinal break of the implant itself, with no posterior subretinal fluid.  It was then elected to close. Tenons capsule was pulled forward in the four quadrants and tied as a separate layer with a 6-0 chromic catgut suture.  The conjunctiva was pulled forward with a running 6-0 chromic catgut suture.  Depo-Garamycin and dexamethasone were injected in the subtenon space inferiorly.  Maxitrol and atropine ointment were instilled in the conjunctival cul-de-sac.  A light patch and protective shield were applied.  Duration of procedure:  One to one and one-half hour.  Patient tolerated the procedure well in general and left the operating room for the recovery room and subsequently to the 23-hour observation unit. DD:  11/04/00 TD:  11/04/00 Job: [id number]

## 2010-11-08 NOTE — H&P (Signed)
NAME:  Timothy Kim, Timothy Kim NO.:  [id number]   MEDICAL RECORD NO.:  [id number]                   PATIENT TYPE:  INP   LOCATION:  6715                                 FACILITY:  [facility]   PHYSICIAN:  Gordy Savers, M.D. [facility]      DATE OF BIRTH:  1930-04-18   DATE OF ADMISSION:  09/08/2003  DATE OF DISCHARGE:                                HISTORY & PHYSICAL   CHIEF COMPLAINT:  Weakness.   HISTORY OF PRESENT ILLNESS:  The patient is a 77 year old white gentleman  with a history of daily alcohol use.  He is status post lumbar laminectomy  March 11th and was discharged three days prior to admission.  Three days ago  the patient had two drinks following his return home, but over the past 48  hours has abstinent from alcohol use.  Over this period of time he has  become much more confused, tremulous and at times disoriented with some  visual hallucinations.  He has become weaker with poor oral intake and has  fallen on several occasions.  Due to the decline in his clinical status he  was brought to the ER where evaluation revealed significant hypokalemia.  He  is now admitted for evaluation and treatment of his electrolyte disturbance  and for alcohol withdrawal management.   PAST MEDICAL HISTORY:  The patient underwent a lumbar laminectomy on March  11th and discharged on March 15th.   Medical problems include:  1. Hypertension.  2. History of alcohol abuse.  3. Gastroesophageal reflux disease.  4. There is a remote history of tobacco use.  5. The patient has recently undergone outpatient evaluation for a right     pulmonary nodule that was benign.  6. The patient was hospitalized in June 2002 for right rotator cuff surgery.  7. The patient was hospitalized in 1988 on two occasions for hernia repair.  8. The patient was also hospitalized in May 2002 following a retinal     detachment.   MEDICATIONS:  Medical regimen includes:  1. Metoprolol 100 mg  daily.  2. Hydrochlorothiazide 25 mg daily.  3. Ambien p.r.n.  4. The patient is taking an unknown analgesic and muscle relaxant postop.   SOCIAL HISTORY:  The patient lives with a girl friend of several years  duration.  He drinks four to five vodkas nightly.  Former smoker.   FAMILY HISTORY:  Father died at age 83 of unclear causes.  Mother died at 35  of lung cancer.   PHYSICAL EXAMINATION:  GENERAL APPEARANCE: Exam reveals a well-developed,  well-nourished, mildly overweight male who is weak and slightly confused,  but in no acute distress.  VITAL SIGNS:  Vital signs reveal a pulse rate of 100 and temperature 99.2.  SKIN:  Skin is unremarkable.  There are no ecchymoses or hematomas.  His  lumbar laminectomy incision is clean.  HEAD AND NECK:  Exam reveals no obvious head trauma. Pupillary responses are  normal.  Conjunctivae clear.  ENT unremarkable.  Neck is supple without  adenopathy.  CHEST:  Chest is clear.  HEART:  Cardiovascular exam shows normal S1 and S2, no murmurs or gallops.  ABDOMEN:  Abdomen is soft and nontender.  There is no organomegaly.  No  stigmata of chronic liver disease.  GENITALIA:  External genitalia normal.  EXTREMITIES:  Extremities reveal intact peripheral pulses.  No edema.  NEUROLOGIC:  Neuro exam is nonfocal.  He is mildly confused and tremulous.   IMPRESSION:  1. Alcohol withdrawal syndrome.  2. Status post recent lumbar laminectomy.  3. Hypertension.  4. Hypokalemia.  5. Alcohol abuse.   DISPOSITION:  This patient will be admitted to the hospital and carefully  hydrated.  His electrolyte abnormalities will be corrected.  He will be  treated with a lorazepam protocol.  He will also be treated with thiamine  and multivitamin supplements.                                                Gordy Savers, M.D. [facility]    PFK/MEDQ  D:  09/08/2003  T:  09/10/2003  Job:  [id number]

## 2010-11-08 NOTE — H&P (Signed)
. [facility]  Patient:    Timothy Kim, Timothy Kim                     MRN: [id number] Adm. Date:  [id number] Disc. Date: [id number] Attending:  Ivor Messier CC:         Doris Cheadle. Dione Booze, M.D.  Gordy Savers, M.D.   History and Physical  REASON FOR ADMISSION:  This was a planned outpatient surgical admission of this 77 year old white male, admitted with a rhegmatogenous retinal detachment of the right eye.  PRESENT ILLNESS:  This patient began to note the onset of floaters and occasional lightening flash in his right eye two weeks prior to admission. This became progressively worse with an intermittent nasal visual field defect.  The patient was seen by Dr. Molly Maduro L. Groat, his regular ophthalmologist, and found to have a retinal detachment.  The patient was referred to my office where this diagnosis was confirmed and arrangements made for his outpatient admission.  PAST MEDICAL HISTORY:  Patient states that he has a torn rotator cuff shoulder injury and has been taking regular aspirin several times a day.  Other medications include metoprolol, hydrochlorothiazide and Prilosec under the direction of his regular physician, Dr. Gordy Savers.  REVIEW OF SYSTEMS:  No current cardiorespiratory complaints.  PHYSICAL EXAMINATION:  VITAL SIGNS:  As recorded on admission, blood pressure 151/85, pulse 78, respirations 20, temperature 98.3.  GENERAL APPEARANCE:  Patient is a pleasant, well-nourished, well-developed white male in acute ocular distress.  EYES:  Visual acuity:  Hand motion to 20/300, right eye; 20/30 left eye. External ocular and slitlamp examination:  The eyes are white and clear with slight lens nuclear sclerosis.  Detailed fundus examination with indirect ophthalmoscopy revealed a hazy vitreous, a detached retina with a rhegmatogenous retinal horseshoe tear at the 9 oclock position.  The macular area is detached.  The  optic nerve is sharply outlined, good color, disk/cup ratio 0.4.  Left eye:  Clear vitreous, attached retina, normal optic nerve, blood vessels and macula.  CHEST:  Lungs clear to percussion and auscultation.  HEART:  Normal sinus rhythm.  No cardiomegaly.  No murmurs.  ABDOMEN:  Negative.  EXTREMITIES:  Negative.  ADMISSION DIAGNOSIS:  Rhegmatogenous retinal detachment, right eye, single defect.  SURGICAL PLAN:  Scleral buckling procedure, right eye, with possible vitrectomy.  Patient has been given oral discussion and printed information concerning the procedure and its possible complications.  He has signed an informed consent and arrangements made for his admission at this time. DD:  11/04/00 TD:  11/04/00 Job: [id number]

## 2010-11-08 NOTE — Assessment & Plan Note (Signed)
[facility]                              [facility] OFFICE NOTE   NAME:Timothy Kim, Timothy Kim                       MRN:          [id number]  DATE:03/11/2006                            DOB:          March 27, 1930   The patient is a 77 year old gentleman seen today for comprehensive exam.  Medical problems include hypertension, gastroesophageal reflux disease, DJD  with chronic low back pain, also history of glucose intolerance, alcoholism.  Was last hospitalized for laminectomy in 2005.  This was complicated by DTs.  He states that he drinks 2 drinks every evening.  Family and friends have  described concerns with excessive alcohol use.  He states he has doing quite  well today without concerns or complaints.   REVIEW OF SYSTEMS:  Exam is negative. That his last colonoscopy in 2002.   FAMILY HISTORY:  Father died of sudden cardiac death at 68.  Mother died at  46.  History of hypertension.  Family history is positive for colon and  throat cancer.   EXAM:  Revealed a well-developed male, alert, appropriate in no acute  distress, well-nourished.  Blood pressure was 130/80.  Fundi, ears, nose and throat unremarkable.  NECK:  No bruits.  CHEST:  Was clear.  CARDIOVASCULAR:  Normal heart sounds, no murmurs.  ABDOMEN:  Benign.  Right lower quadrant scar noted.  External genitalia normal.  EXTREMITIES:  Revealed trace edema, left greater than the right.  Peripheral  pulses were intact.  NEUROLOGICAL:  Revealed diminished vibratory sensation distally.  RECTAL:  Prostate +1 to 2 and benign.  Stool heme-negative.   IMPRESSION:  1. Hypertension stable.  2. Erectile dysfunction.  3. Alcoholism.  4. Chronic low back pain.  5. Degenerative joint disease.   DISPOSITION:  1. Medical regimen unchanged.  2. Reassess in six months.  3. Flu vaccine recommended.  4. Total abstinence also discussed.                                   Gordy Savers, MD   PFK/MedQ  DD:  03/11/2006  DT:  03/12/2006  Job #:  [id number]

## 2010-11-08 NOTE — Op Note (Signed)
[facility]  Patient:    Timothy Kim, Timothy Kim                     MRN: [id number] Proc. Date: 12/11/00 Adm. Date:  [id number] Attending:  Erasmo Leventhal                           Operative Report  PREOPERATIVE DIAGNOSES:  Right shoulder large retracted rotator cuff tear with symptomatic degenerative AC joint.  POSTOPERATIVE DIAGNOSES:  Right shoulder massive retracted rotator cuff tear with symptomatic degenerative AC joint.  PROCEDURE:  Right shoulder open rotator cuff repair, subacromial decompression, Mumford procedure distal clavicle resection, augmentation with restore patch.  SURGEON:  Valma Cava, M.D.  ASSISTANT:  Cherly Beach, P.A.-C.  ANESTHESIA:  Interscalene block, general.  ESTIMATED BLOOD LOSS:  Less than 50 cc.  DRAINS:  None.  COMPLICATIONS:  None.  DISPOSITION:  To PACU stable.  DESCRIPTION OF PROCEDURE:  The patient was counselled in the holding area, correct side identified. Also preoperatively discussed utilization to restore patch with him and he agreed. He was given IV antibiotics. He was taken to the OR and placed in supine position under general anesthesia to a modified beach chair position, properly padded and bumped.  Right shoulder range of motion was full. Prepped with Duraprep and draped in a sterile fashion. An oblique incision was made across the [facility] joint and anterior acromion through the skin and subcutaneous tissue. Skin flaps were developed. The deltotrapezial fascia, AC joint capsule, and anterior acromion was then opened releasing a ______ of deltoid cuff for later repair. The rotator cuff was inspected and found to be a massive retracted tear with rotator cuff tendinopathy.  The distal clavicle was outlined and found to be markedly osteoarthritic and utilizing the saw, the lateral 1.5 cm was removed. Hemostasis was obtained.  The axillary nerve was protected throughout the case, placed in a  stay suture at 4 cm making sure there was no excessive retraction or dissection of the deltoid and it was protected throughout the case. An anterior inferior acromioplasty was performed moving it with a saw and then a rasp.  The subacromial bursa was excised. The rotator cuff was then meticulously mobilized both intra and extra-articular. The biceps tendon revealed tendinopathy that was present. At this point, the area of the rotator cuff repair was repaired and then utilizing a rongeur, the edges were smoothed down to bleeding bone to increase the healing response. Three ______ suture anchors were then placed at the appropriate level and the rotator cuff repair was mobilized and sutured back to the edge of the articular surface to bleeding cancellous bone. At this time, I felt that the tear was of significant degree and also included an Ethibond suture posteriorly to suture the supra and infraspinatus split. At this time the restored augmentation patch was taken probably cut and contoured to the area and then sewn down with 2-0 PDS suture with the atraumatic needle. This was done in the standard fashion, given excellent water tight seal. The shoulder was put through a range of motion and the suture line remained stable as did the repair. The wounds were irrigated and no other abnormalities were noted. Also noted, there were no abnormalities noted in the joint prior to closure of the rotator cuff.  The meticulous closure of the [facility] joint capsule and deltotrapezial fasica was done with the Vicryl. The deltoid was repaired back down acromion  with Vicryl. Subcu Vicryl, skin subcuticular monocryl sutures. Each site was irrigated during the closure. Benzoin and Steri-Strips were applied. He was given another gram of Ancef intravenously. He was placed on an abduction pillow, awakened, extubated and taken from the operating room to the PACU in stable condition. There were no complications. Sponge  and needle count were correct. DD:  12/11/00 TD:  12/11/00 Job: [id number]

## 2010-11-08 NOTE — Discharge Summary (Signed)
Timothy Kim, Timothy Kim NO.:  [id number]   MEDICAL RECORD NO.:  [id number]                   PATIENT TYPE:  INP   LOCATION:  5018                                 FACILITY:  [facility]   PHYSICIAN:  Kerrin Champagne, M.D.                DATE OF BIRTH:  Feb 06, 1930   DATE OF ADMISSION:  09/01/2003  DATE OF DISCHARGE:  09/05/2003                                 DISCHARGE SUMMARY   ADMISSION DIAGNOSES:  1. Spinal stenosis, L3-L4 and L4-5 centrally with grade 1 degenerative     spondylolisthesis at the L4-5 level on the right.  Foraminal hernia to     nucleus pulposus at L4-5 and bilateral lateral recess stenosis at L5-S1.  2. Hypertension.  3. Glucose intolerance treated with diet.  4. Recent needle biopsy of lung nodule with benign results.  5. Occasional reflux symptoms.   DISCHARGE DIAGNOSES:  1. Spinal stenosis, L3-L4 and L4-5 centrally with grade 1 degenerative     spondylolisthesis at the L4-5 level on the right.  Foraminal hernia to     nucleus pulposus at L4-5 and bilateral lateral recess stenosis at L5-S1.  2. Hypertension.  3. Glucose intolerance treated with diet.  4. Recent needle biopsy of lung nodule with benign results.  5. Occasional reflux symptoms.  6. Mild postop anemia.  7. Narcotic-induced hallucinations, resolved with change of agent.   PROCEDURES:  On September 01, 2003, patient underwent central decompressive  laminectomy at L3-4 and L4-5 with removal of spinous process of L3 and L4,  central portions of lamina of L3 and L4, bilateral partial facetectomies, L3-  L4 and L4-5.  Decompression of bilateral L3 and bilateral L4 and bilateral  L5 nerve roots.  Right L4 foraminal decompression with excision of disk  under the microscope within the right neuroforamen.  Posterolateral fusion,  L4-5, utilizing local bone graft with Symphony material.  Bilateral L5-S1  lateral recess decompression.  This was performed by Dr. Otelia Sergeant and assisted  by  Maud Deed, PA-C, under general anesthesia.   CONSULTATIONS:  None.   BRIEF HISTORY:  Patient is a 77 year old male with increasing and  progressive neurogenic claudication.  He has been treated conservatively  with epidural steroid injections and is now requiring constant use of  narcotic analgesics for relief of the pain radiating into his lower  extremities.  It was felt he would require surgical intervention and he was  admitted for the procedures as stated above.   BRIEF HOSPITAL COURSE:  Patient tolerated the procedures under general  anesthesia without complications.  On the first postoperative day,  neurovascular motor function was intact in the lower extremities.  Hemovac  drain was removed from his wound.  Dressing changes were done daily  thereafter and wound was noted to be healing without signs of infection.  Patient had abdominal bloating postoperatively with no bowel movement and  minimal flatus.  He was treated  with stool softeners and kept on a clear  diet until he was able to move his bowels and have flatus.  This occurred on  the second postoperative day and patient continued to have appropriate bowel  function during the hospital stay.  Urine was noted to be concentrated and  his Foley catheter was discontinued and patient was able to void without  difficulty.  Patient initially was treated with PCA analgesics for his  discomfort.  He had some difficulty with pain control and was treated with  OxyContin and OxyIR combination.  He experienced hallucinations and  difficulty with fine motor skills with this medication.  This was changed to  [facility] and he seemed to do better with pain control.  Patient was  advanced to a regular diet and tolerated this without difficulty.   Patient was seen by physical therapy.  He was fitted with an Aspen LSO and  was taught to don and doff the brace at bedside.  Instructions were given  for patient to utilize the brace at  all times when out of bed.  Unfortunately, he was not totally compliant with this and this was  documented by the occupational therapist during the hospital stay.  Recommendations for home health physical therapy and home needs were made by  the physical therapist.  These were arranged through [facility].  Patient was stable on his fourth postoperative day having  ambulated in the hallway as much as 200 feet.  He was able to be discharged  in stable condition.   PERTINENT LABORATORY VALUES:  EKG on admission revealed normal sinus rhythm.  No significant change since last tracing, confirmed by Dr. Elease Hashimoto.  Chest x-  ray revealed stable right lower lobe pulmonary mass with adjacent scarring  by plain radiograph; no acute change.  Hemoglobin and hematocrit on  admission were 14.6 and 43.4 with a normal white cell count at 7.8.  Hemoglobin dropped to the lowest value of 11.4 and 33.2 postoperatively and  was stable.  Regulation studies on admission were within normal limits.  Chemistry studies on admission normal with exception of glucose 192, albumin  3.4.  Repeat chemistries postoperatively revealed sodium at 133 and  potassium at 3.2, which normalized to 4.1.  He continued to have a mildly  decreased BUN at 5.  Calcium noted to be 8.1.  Urinalysis on admission with  cloudy appearance, 15 ketones, 30 protein, trace leukocyte esterase, 0-2  wbc's, 0-2 rbc's, calcium oxalate crystals, and few bacteria.   PLAN:  Patient was discharged to his home.  Arrangements were made for  appropriate DME which included a rolling walker with wheels, 3-in-1 toilet  seat.  Arrangements were also made for home health physical therapy and  occupational therapy consult at his home.   PRESCRIPTIONS AT DISCHARGE:  Mepergan Fortis, Robaxin, and Ambien.  Instructions for each were given.   Patient will follow up with Dr. Otelia Sergeant two weeks from the date of his surgery.  He is advised to call the  office to make the arrangements.  He  will change his dressing daily.  Patient will be allowed to shower at home.  Brace will be worn at all times with the exception of when he is sleeping.  He has been advised to call Dr. Barbaraann Faster office if he has questions or  concerns prior to his return office visit.  Patient is encouraged to  ambulate as much as tolerated.  He is to avoid driving until his  return  office visit.  Patient is instructed to wear his TED hose eight hours per  day but he may remove them at night.  Instructions also for no anti-  inflammatory medications to be utilized; however, he will resume his home  medications.  Over-the-counter stool softener daily.  No bending, lifting,  or twisting.   CONDITION ON DISCHARGE:  Stable.      Wende Neighbors, P.A.                    Kerrin Champagne, M.D.    SMV/MEDQ  D:  11/29/2003  T:  11/30/2003  Job:  [id number]

## 2010-11-08 NOTE — Op Note (Signed)
NAME:  Timothy Kim, Timothy Kim                        ACCOUNT NO.:  [id number]   MEDICAL RECORD NO.:  [id number]                   PATIENT TYPE:  INP   LOCATION:  5018                                 FACILITY:  [facility]   PHYSICIAN:  Kerrin Champagne, M.D.                DATE OF BIRTH:  Sep 17, 1929   DATE OF PROCEDURE:  09/01/2003  DATE OF DISCHARGE:                                 OPERATIVE REPORT   PREOPERATIVE DIAGNOSIS:  Lumbar spinal stenosis L3-L4 and L4-L5 centrally  with grade 1 degenerative spondylolisthesis at the L4-L5 level on the right,  foraminal herniated nucleus pulposus at the L4-L5 segment, bilateral lateral  recess stenosis L5-S1.   POSTOPERATIVE DIAGNOSIS:  Lumbar spinal stenosis L3-L4 and L4-L5 centrally  with grade 1 degenerative spondylolisthesis at the L4-L5 level on the right,  foraminal herniated nucleus pulposus at the L4-L5 segment, bilateral lateral  recess stenosis L5-S1.   PROCEDURE:  Central decompressive laminectomy at L3-L4 and L4-L5 with  removal of spinous process of L3 and L4, central portions of lamina of L3  and L4, bilateral partial facetectomies L3-L4 and L4-L5.  Decompression of  bilateral L3 and bilateral L4 and bilateral L5 nerve roots.  Right L4  foraminal decompression with excision of disc under the microscope within  the right neural foramen.  Posterolateral fusion L4-L5 using local bone  graft with Symphony material.  Bilateral L5-S1 lateral recess decompression.   SURGEON:  Kerrin Champagne, M.D.   ASSISTANT:  Wende Neighbors, P.A.   ANESTHESIA:  GOT, Dr. Noreene Larsson   ESTIMATED BLOOD LOSS:  350 mL.   DRAINS:  Foley catheter to straight drain,  Hemovac x 1.   BRIEF CLINICAL HISTORY:  This patient is a 77 year old male who has had the  problem of increasingly more painful neurogenic claudication, pain with  upright position and ambulation.  The patient has been experiencing severe  pain and radiation into his legs.  The pain has been treated  with steroid  injections and the use of OxyContin.  He has been resistant to conservative  management and after a prolonged attempt at conservative management over six  months, the patient is brought to the operating room to undergo  decompressive laminectomy for central spinal stenosis at the L3-L4 and L4-L5  level, worse at L4-L5, and right foraminal decompression for apparent right  large foraminal disc protrusion.  He will undergo fusion at the L4-L5 level  for spondylolisthesis at this segment as well as bilateral lateral recess  decompression for lateral recess stenosis at L5-S1.  The patient's pain is  rather diffuse involving his right lower extremity.   OPERATIVE FINDINGS:  The patient was found to have severe lumbar spinal  stenosis secondary to hypertrophic ligamentum flavum and hypertrophic facet  arthrosis at L4-L5, greater than L3-L4, and a moderate lateral recess  stenosis at L5-S1 bilaterally.  He was found to have severe foraminal  entrapment affecting  the right L4 nerve root requiring a total right  facetectomy.  Attempts at decompressing disc protrusion found the disc  protrusion to primarily represent the spondylolisthesis and a pseudo-  protruded disc here.  However, severe nerve root compression was found to be  present within the foramen on the right side at L4-L5.   DESCRIPTION OF PROCEDURE:  After adequate general anesthesia, the patient in  the knee chest position on the Andrews frame, standard preoperative  antibiotics, standard prep with DuraPrep solution, Foley catheter placed  prior to being placed on the Florala frame.  All pressure points were well  padded.  Standard prep with DuraPrep solution, draped in the usual manner,  Ioban drape used.  The incision in the midline extending from approximately  L2 down to S1 through the skin and subcutaneous layers after infiltration of  Marcaine 0.5% with 1:200,000 epinephrine.  The incision was carried sharply  to  the lumbodorsal fascia.  A self-retaining retractor was placed.  The  lumbodorsal fascia was incised on both sides of the spinous process of L5,  L4, L3, and L2.  Interoperative lateral radiograph of the lumbar spine was  obtained with clamps on the spinous process of L5 and S1.  The incision was  then carried caudally to the S1 level and cranially to the S3 level.  Two  Cobbs were used to elevate the paralumbar muscles off the posterior aspect  of the posterior elements of the spinous process and lamina of L2, L2-L3,  L3, L4, L5, and S1.  Archer Asa and [employer] retractors were then  inserted for continued exposure.  Bleeding was controlled using bipolar and  monopolar electrocautery controlling the segmental facet bleeders.   Dissection was carried out over the top of the facets at the L3-L4 level  exposing the L4 transverse process bilaterally preserving the facet capsule  at L3-L4.  The facet capsule at L4-L5 was resected and exposure obtained out  to the transverse process of L5 bilaterally.  The paralumbar muscles were  debrided in the intertransverse region in order to allow for exposure of the  recess extending from the transverse process of L4 to L5.  These areas were  then packed after careful hemostasis.  A Leksell rongeur was used to remove  the spinous process of L3-L4 centrally preserving this bone and using it  later for bone graft purposes for the posterolateral fusion.  Central  portions of the lamina were also thinned posteriorly and a small portion of  the inferior half of the spinous process of L2 was resected to allow for  exposure of the L2-L3 interspace for continuation of the decompression.  A 3  mm Kerrison was then introduced underneath the lamina centrally at the L4  level resecting the central portions of the L4 lamina and continuing up to  the L3 lamina and resecting this centrally and then continuing up the L2-L3 level.  Then, continuing downward from  cranial to caudal, the laminectomy  was opened more widely resecting the lamina laterally and saving the bone  for later bone graft purposes.  The central laminectomy was carried out to  the level of the pedicle at the L3, L4, and L5 levels to insure adequate  decompression of the central portions of the lamina.   Medial facetectomy was performed at L4-L5 and at L3-L4 using a half inch  osteotome protecting the nerve root and thecal sac appropriately.  The  ligamentum flavum was then resected using loupe magnification initially  bilaterally decompressing bilateral L3, bilateral L4, and bilateral L5 nerve  roots.  Performing foraminotomy over each of these segments.  The neural  probe could be easily passed out the neural foramen bilaterally at L3,  bilaterally at L4, and bilaterally at L5.  At the L5-S1 level, a Leksell was  used to carefully thin the inferior aspect of the lamina of L5 on both sides  performing partial hemilaminectomies up to the insertion of the ligamentum  flavum.  The ligamentum flavum was then lifted using a nerve hook and  resected off the superior portion of the S1 lamina and off the medial aspect  of the facet, first on the left side and then on the right side.  The  operating room microscope was then draped and brought into the field under  direct magnification and then careful decompression was carried out of the  lateral recess from L5 to S1 out to the level of the pedicle bilaterally  resecting about 10-15% of the medial aspect of the facet on both sides and  hypertrophic ligamentum flavum on both sides.  A hockey stick neural probe  could be easily passed out the S1 neural foramen, the L5 neural foramen from  the L5-S1 hemilaminectomy site on both sides of L5-S1.  The disc, itself,  did not show any evidence of protrusion at L5-S1.  Thrombin soaked Gelfoam  was then placed over the L5-S1 level.   The microscope was then turned to the central laminectomy  defect, first on  the right side and then on the left side, examining the L5 nerve roots, the  L4 nerve roots, and the L3 nerve roots.  Excess hypertrophic ligamentum  flavum off the medial facet was resected at each segment decompressing the  nerve roots within the lateral recess.  A hockey stick neural probe could  then be easily passed out the neural foramen on the right L5, L4, and L3  nerve roots.  On the left side, similarly, this was carried out.  On the  right side, careful attention was turned to the right L4-L5 neural foramen  where the L4 nerve root exited, on myelogram noted to be a large disc  protrusion into the foramen and extraforaminal.  Under cutting the facet  using 3 mm and 4 mm Kerrisons, it was evident that the L4 nerve root was  showing significant amount of persistent compression out laterally and far  lateral.  An incision was made on the disc space on the right side and pituitary rongeurs used to grasp and remove disc material from the right  posterolateral corner of the disc which appeared to  be protruded.  A hockey  stick neural probe could be passed anterior to the L4 nerve root and  posterior to it following decompression.  A total facetectomy on the right  side was completed in order to complete decompression of the L4 nerve root  and insure that it was well decompressed.   Following this, bone grafting was carried out using high speed burs to  carefully decorticate the transverse process of L4 and L5 bilaterally as  well as the lateral aspect of the superior articular process of L5 and L4  bilaterally.  Bone graft that had been obtained from the central laminectomy  was morselized and debrided of any soft tissue attachments.  This was  combined with Symphony material and platelet rich plasma.  The log provided  was then placed into the posterolateral gutters extending from the  transverse  process of L4 to L5, both sides was well decorticated providing   excellent bone fusion mass.  Following this, a hockey stick neural probe was  again passed out the neural foramen bilaterally L5, bilaterally L4, and  bilateral L3 nerve roots assuring they were well decompressed.  A portion of  Gelfoam was placed over the posterolateral fusion to insure they would not  return back into the laminectomy defect with closure of the wound.  A medium  Hemovac drain was placed in the depths of the incision.  The paralumbar  muscles were loosely reapproximated in the midline with interrupted #1  Vicryl sutures, the lumbodorsal fascia was reattached to the spinous process  of L5, S1, and to L2 up above and closed using interrupted figure-of-eight  simple sutures of #1 Vicryl.  The deep subcu layers were reapproximated with  interrupted #1 and 0 Vicryl sutures, the more superficial layers with  interrupted 2-0 Vicryl sutures, and the skin was closed with running subcu  stitch of 4-0 Vicryl.  Tincture of Benzoin and Steri-Strips applied.  4 by  4s were affixed to the skin with an ABD pad and Hypofix pad.  The patient  was then returned to a supine position, reactivated, extubated, and returned  to the recovery room in satisfactory condition.  All instrument and sponge  counts were correct.                                               Kerrin Champagne, M.D.    JEN/MEDQ  D:  09/01/2003  T:  09/02/2003  Job:  [id number]

## 2010-11-08 NOTE — H&P (Signed)
[facility]  Patient:    Timothy Kim, Timothy Kim                       MRN: [id number] Adm. Date:  12/11/00 Attending:  R. Valma Cava, M.D. Dictator:   Carlean Jews, P.A.-C CC:         Gordy Savers, M.D. [facility]   History and Physical  DATE OF BIRTH:  1929-07-24  CHIEF COMPLAINT:  Right shoulder pain.  HISTORY OF PRESENT ILLNESS:  The patient has been having significant right shoulder pain and disability for the past two years since an injury.  It has been significantly worse since January with weakness and pain affecting his activities of daily living.  About two years ago, he lifted a log and turned to the left and felt something pull in his right arm and the next day he noticed ecchymosis and swelling down the upper portion of his right arm. Since that time, he has had episodes of right shoulder pain coming and going but now he has had a couple of months history of increasing right shoulder pain, severe pain at night, and it feels like a toothache with weakness of that right upper extremity.  Basically, he has gotten to the point where he cannot even shut a drawer using the right arm or in external rotation.  MRI scan showed a large joint effusion with extensive full-thickness tear of the supraspinatus and infraspinatus tendons with retraction.  There is also edema in the infraspinatus muscle belly.  Subscapularis shows attenuation, marked biceps tendinopathy versus tear, and degenerative changes at the [facility] joint.  Because of these results as well as the patients physical exam and failure to conservative treatments, it was decided that surgical correction would be the most appropriate course for this patient at this time.  Risks and benefits to surgery were discussed with the patient and he understood these and chose to proceed with surgical correction to the right shoulder.  ALLERGIES:  He has no known drug allergies.  MEDICATIONS: 1.  Metoprolol 100 mg q.d. 2. Hydrochlorothiazide 25 mg q.d. 3. Prilosec 20 mg q.2-3d.  PAST HISTORY:  Significant for hypertension and gastroesophageal reflux disease.  PAST SURGICAL HISTORY:  Significant for two hernia operations in 1980 and a retinal detachment repair which was done on Nov 03, 2000.  SOCIAL HISTORY:  He denies tobacco use.  Alcohol:  He drinks three to four vodkas per day.  He is divorced and currently lives with his girlfriend.  FAMILY HISTORY:  His mother is deceased at age 46 from lung cancer.  His father is deceased at age 20 of unknown causes.  REVIEW OF SYSTEMS:  GENERAL:  Negative for fevers, chills, night sweats or bleeding tendencies.  CNS:  Negative for blurred vision, double vision, seizure, headache or paralysis.  PULMONARY:  Negative for shortness of breath, productive cough or hemoptysis.  CARDIOVASCULAR:  Negative for chest pain, angina, orthopnea or claudication.  GI:  Negative for nausea, vomiting, constipation, diarrhea, melena or bloody stool.  GU:  Negative for dysuria, hematuria or discharge.  MUSCULOSKELETAL:  Per HPI.  PHYSICAL EXAMINATION:  VITAL SIGNS:  Blood pressure is 140/82, respirations 16 and unlabored and pulse was 90 and regular.  GENERAL:  Patient is a 77 year old white male who was alert and oriented, very pleasant and cooperative to exam.  HEENT:  His right eye is significantly injected with some supraorbital swelling and some lid swelling which are keeping his right  eye closed.  He did have surgery on the 14th of May for a retinal detachment, emergency surgery, and this is a normal sequela to this surgery and patient states that his ophthalmologist is not alarmed and encouraged him to go ahead with his shoulder surgery if he desired.  NECK:  Soft to palpation without bruits.  No thyromegaly.  CHEST:  Clear to auscultation bilaterally.  No rales, rhonchi, stridor, wheezes or friction rubs appreciated.  BREASTS:  Not  pertinent.  HEART:  Normal S1, S2.  Regular rate and rhythm.  No murmurs, gallops or rubs appreciated.  ABDOMEN:  Positive bowel sounds, is soft, nontender, nondistended and no organomegaly.  GU:  Not pertinent.  EXTREMITIES:  Decreased range of motion to the right upper extremity, significant pain with active range of motion and significant weakness with external rotation.  Positive impingement signs.  SKIN:  Intact.  There are no rashes or erythema present.  IMAGING STUDY:  MRI scan showed a large joint effusion with extensive full-thickness tear of the supraspinatus and infraspinatus tendons with retraction; there is also edema in the infraspinatus muscle belly. Subscapularis shows marked attenuation, marked biceps tendinopathy versus tear, and degenerative changes of the AC joint.  IMPRESSION: 1. Right shoulder rotator cuff tear. 2. Hypertension. 3. Gastroesophageal reflux disease. 4. Status post retinal detachment repair on Nov 03, 2000 to the right eye.  PLAN: 1. The plan is to admit to [facility] on December 11, 2000 to    Dr. Benny Lennert for a right shoulder open subacromial decompression,    distal clavicle resection and rotator cuff repair and Restore patch. 2. Patients medical doctor is Dr. Gordy Savers and he has an    appointment on November 24, 2000 to receive his medical clearance.  That will be    faxed to the hospital upon receipt of it. 3. Patients ophthalmologist has also been contacted and medical clearance    from him from an ophthalmology standpoint will also be obtained. DD:  11/24/00 TD:  11/24/00 Job: [id number]

## 2010-11-08 NOTE — Discharge Summary (Signed)
[place]  Patient:    Timothy Kim, Timothy Kim                     MRN: [id number] Adm. Date:  [id number] Disc. Date: [id number] Attending:  Ivor Messier CC:         Doris Cheadle. Dione Booze, M.D.  Gordy Savers, M.D.   Discharge Summary  HISTORY OF PRESENT ILLNESS:  This was an emergency admission and surgery of this 77 year old white male patient admitted with a rhegmatogenous retinal detachment of the right eye.  (See detailed admission history and physical.)  HOSPITAL COURSE:  The patient was evaluated preoperatively and felt to be in satisfactory condition for the proposed surgery; he therefore was taken into the operating room where a scleral buckling procedure was performed under general anesthesia without complication.  Patient tolerated the one-and-one-half-hour procedure well and was taken to the recovery room and subsequently to the 23-hour observation unit.  Patient was seen again on the morning following surgery and felt to be doing well.  The retina had settled in place with a good buckling indentation.  It was felt that the patient had achieved maximal hospital benefit and could be discharged home to be followed on limited activity and in the office.  DISCHARGE INSTRUCTIONS:  Patient was given a printed list of discharge instructions on the care and use of the operated eye.  Discharge ocular medications included Tobradex and Cyclomydril ophthalmic solutions, one drop four times a day, five minutes apart, and Maxitrol and atropine ointment at bedtime.  Patient is to limit his activity, lie toward the right side and to call the office for a followup appointment within five to seven days.  CONDITION ON DISCHARGE:  Improved.  DISCHARGE DIAGNOSIS:  Rhegmatogenous retinal detachment, right eye, single defect. DD:  11/04/00 TD:  11/04/00 Job: [id number]

## 2010-11-08 NOTE — Discharge Summary (Signed)
NAMECHRISTOFER, Timothy Kim                        ACCOUNT NO.:  [id number]   MEDICAL RECORD NO.:  [id number]                   PATIENT TYPE:  INP   LOCATION:  6715                                 FACILITY:  [facility]   PHYSICIAN:  Rene Paci, M.D. [facility]          DATE OF BIRTH:  [birth year]   DATE OF ADMISSION:  09/08/2003  DATE OF DISCHARGE:  09/12/2003                                 DISCHARGE SUMMARY   DISCHARGE DIAGNOSES:  1. Progressive weakness.  2. Frequent falls.  3. Hallucinations.  4. Alcohol withdrawal.  5. Alcoholic hepatitis.  6. Pulmonary mass.  7. Mild glucose intolerance.   BRIEF ADMISSION HISTORY:  Timothy Kim is a 77 year old white male who is  status post lumbar laminectomy on September 01, 2003.  He was discharged home 3  days prior to this admission.  The patient subsequently has developed  progressive weakness with increased falls.  Reportedly, the patient has also  had hallucinations.  There is a question of acute alcohol withdrawal.   PAST MEDICAL HISTORY:  1. Status post lumbar laminectomy on September 01, 2003.  2. Hypertension.  3. History of alcohol.  4. Gastroesophageal reflux disease.  5. Right rotator cuff.  6. Status post hernia repair in 1988.  7. History of a retinal detachment in May of 2002.  8. Right lower lobe mass.   HOSPITAL COURSE:  PROBLEM #1 - ORTHOPEDICS:  As noted, the patient was  status post recent lumbar laminectomy.  The patient is a man who has had  subsequent falls at home.  The patient had plain films done here that showed  stable alignment.  The patient was evaluated by Dr. Kerrin Champagne; he felt  to be stable for physical therapy and outpatient followup with orthopedics.   PROBLEM #2 - MENTAL STATUS CHANGES:  The patient is felt to perhaps have  some degree of mild dementia.  He may have had some acute delirium that was  exacerbated by medications including the Vicodin he was sent home on after  surgery and now possibly the Ativan  he is receiving for alcohol taper; also  complicating the picture may be alcohol withdrawal itself.  Currently, the  patient has been tapered off Ativan and his mental status is improved.  Currently, he is alert and oriented.  He is also appropriate.   PROBLEM #3 - INCREASED FALLS:  Again, this is probably multifactorial  secondary to alcohol withdrawal as well as perhaps medications such as the  Vicodin he was sent home on.  The patient was seen by physical therapy, who  recommended 24-hour supervision with either home health or subacute.  The  patient has discussed home health with a friend that can provide 24-hour  supervision.  Arrangements are being made for home health PT, R.N. and an  aide.   PROBLEM #4 - ALCOHOL USE AND WITHDRAWAL:  The patient has been tapered off  alcohol with an Ativan taper.  The patient showed no further signs of  alcohol withdrawal.  The patient is interested in an outpatient rehab  program.  Arrangements are being made for him to follow up at an outpatient  rehab center.   PROBLEM #5 - PULMONARY:  As noted, the patient has a history of a right  lower lobe mass.  It looks like he had a CT of the chest in December of 2004  that confirmed the mass.  It then looks like he had an attempted biopsy in  radiology in February 2005 which was unsuccessful secondary to a right  pneumothorax.  Pathology showed that there was inadequate tissue to make a  diagnosis, so it does appear that the patient needs further evaluation of  this pulmonary mass as an outpatient, but we will defer this to his primary  care physician.   LABORATORIES AT DISCHARGE:  BUN 5, creatinine 0.7, AST 51, ALT 44.   MEDICATIONS AT DISCHARGE:  1. Metoprolol 100 mg daily.  2. Hydrochlorothiazide 25 mg daily as at home.  3. Thiamine 100 mg daily.  4. Multivitamin with mineral daily.  5. Folic acid 1 mg daily.   SPECIAL DISCHARGE INSTRUCTIONS:  He is instructed not to take any Vicodin.    FOLLOWUP:  Follow up with Dr. Gordy Savers in 2-3 weeks and Dr.  Otelia Sergeant in 10 days.      Cornell Barman, P.A. [facility]                  Rene Paci, M.D. [facility]    LC/MEDQ  D:  09/12/2003  T:  09/14/2003  Job:  [id number]   cc:   Gordy Savers, M.D. [facility]   Kerrin Champagne, M.D.  [address]  [place]  [place] [address]  Fax: [phone]

## 2010-12-18 ENCOUNTER — Ambulatory Visit (INDEPENDENT_AMBULATORY_CARE_PROVIDER_SITE_OTHER): Payer: Medicare Other | Admitting: Internal Medicine

## 2010-12-18 ENCOUNTER — Encounter: Payer: Self-pay | Admitting: Internal Medicine

## 2010-12-18 VITALS — BP 140/82 | HR 69 | Temp 98.0°F | Resp 12 | Ht 66.0 in | Wt 170.0 lb

## 2010-12-18 DIAGNOSIS — R4789 Other speech disturbances: Secondary | ICD-10-CM

## 2010-12-18 DIAGNOSIS — I10 Essential (primary) hypertension: Secondary | ICD-10-CM

## 2010-12-18 DIAGNOSIS — R4702 Dysphasia: Secondary | ICD-10-CM

## 2010-12-18 DIAGNOSIS — M1990 Unspecified osteoarthritis, unspecified site: Secondary | ICD-10-CM

## 2010-12-18 DIAGNOSIS — K219 Gastro-esophageal reflux disease without esophagitis: Secondary | ICD-10-CM

## 2010-12-18 DIAGNOSIS — Z0000 Encounter for general adult medical examination without abnormal findings: Secondary | ICD-10-CM

## 2010-12-18 DIAGNOSIS — N400 Enlarged prostate without lower urinary tract symptoms: Secondary | ICD-10-CM

## 2010-12-18 DIAGNOSIS — M545 Low back pain: Secondary | ICD-10-CM

## 2010-12-18 DIAGNOSIS — E119 Type 2 diabetes mellitus without complications: Secondary | ICD-10-CM

## 2010-12-18 DIAGNOSIS — J309 Allergic rhinitis, unspecified: Secondary | ICD-10-CM

## 2010-12-18 LAB — CBC WITH DIFFERENTIAL/PLATELET
Basophils Absolute: 0.1 10*3/uL (ref 0.0–0.1)
Basophils Relative: 0.5 % (ref 0.0–3.0)
Eosinophils Absolute: 0.4 10*3/uL (ref 0.0–0.7)
Eosinophils Relative: 3.8 % (ref 0.0–5.0)
HCT: 42.8 % (ref 39.0–52.0)
Hemoglobin: 14.7 g/dL (ref 13.0–17.0)
Lymphocytes Relative: 58.5 % — ABNORMAL HIGH (ref 12.0–46.0)
Lymphs Abs: 6.4 10*3/uL — ABNORMAL HIGH (ref 0.7–4.0)
MCHC: 34.4 g/dL (ref 30.0–36.0)
MCV: 103.7 fl — ABNORMAL HIGH (ref 78.0–100.0)
Monocytes Absolute: 0.6 10*3/uL (ref 0.1–1.0)
Monocytes Relative: 5.3 % (ref 3.0–12.0)
Neutro Abs: 3.5 10*3/uL (ref 1.4–7.7)
Neutrophils Relative %: 31.9 % — ABNORMAL LOW (ref 43.0–77.0)
Platelets: 170 10*3/uL (ref 150.0–400.0)
RBC: 4.12 Mil/uL — ABNORMAL LOW (ref 4.22–5.81)
RDW: 13.8 % (ref 11.5–14.6)
WBC: 11 10*3/uL — ABNORMAL HIGH (ref 4.5–10.5)

## 2010-12-18 LAB — HEPATIC FUNCTION PANEL
ALT: 33 U/L (ref 0–53)
AST: 45 U/L — ABNORMAL HIGH (ref 0–37)
Albumin: 4.2 g/dL (ref 3.5–5.2)
Alkaline Phosphatase: 61 U/L (ref 39–117)
Bilirubin, Direct: 0.2 mg/dL (ref 0.0–0.3)
Total Bilirubin: 1 mg/dL (ref 0.3–1.2)
Total Protein: 6.9 g/dL (ref 6.0–8.3)

## 2010-12-18 LAB — BASIC METABOLIC PANEL
BUN: 17 mg/dL (ref 6–23)
CO2: 31 mEq/L (ref 19–32)
Calcium: 9.3 mg/dL (ref 8.4–10.5)
Chloride: 103 mEq/L (ref 96–112)
Creatinine, Ser: 1 mg/dL (ref 0.4–1.5)
GFR: 79.42 mL/min (ref >60.00)
Glucose, Bld: 99 mg/dL (ref 70–99)
Potassium: 3.9 mEq/L (ref 3.5–5.1)
Sodium: 143 mEq/L (ref 135–145)

## 2010-12-18 LAB — TSH: TSH: 1.6 u[IU]/mL (ref 0.35–5.50)

## 2010-12-18 LAB — LIPID PANEL
Cholesterol: 201 mg/dL — ABNORMAL HIGH (ref 0–200)
HDL: 72.9 mg/dL (ref >39.00)
Total CHOL/HDL Ratio: 3
Triglycerides: 39 mg/dL (ref 0.0–149.0)
VLDL: 7.8 mg/dL (ref 0.0–40.0)

## 2010-12-18 LAB — LDL CHOLESTEROL, DIRECT: Direct LDL: 99.4 mg/dL

## 2010-12-18 LAB — HEMOGLOBIN A1C: Hgb A1c MFr Bld: 6.1 % (ref 4.6–6.5)

## 2010-12-18 MED ORDER — BENAZEPRIL-HYDROCHLOROTHIAZIDE 20-12.5 MG PO TABS: 1.0000 | ORAL_TABLET | Freq: Every day | ORAL | Status: DC

## 2010-12-18 NOTE — Patient Instructions (Signed)
Limit your sodium (Salt) intake      It is important that you exercise regularly, at least 20 minutes 3 to 4 times per week.  If you develop chest pain or shortness of breath seek  medical attention.  You need to lose weight.  Consider a lower calorie diet and regular exercise.  Return in 6 months for follow-up  Barium swallow examination as discussed

## 2010-12-18 NOTE — Progress Notes (Signed)
Subjective:    Patient ID: Timothy Kim, male    DOB: 1929/09/27, 77 y.o.   MRN: [id number]  HPI  History of Present Illness:  77 -year-old patient who is seen today for a comprehensive evaluation. He has a history of treated hypertension. He has additionally, gastroesophageal reflux disease, and a history of diet-controlled diabetes. He has osteoarthritis and chronic low back pain. He has quite well except for his chronic low back pain. For the past few weeks he has had some worsening reflux symptoms and some mild solid food dysphagia. He has had worsening low back pain and has scheduled orthopedic followup. He continues to be followed annually at [facility] for eye care and also locally by Dr. Alden Hipp. Here for  Medicare AWV:  1. Risk factors based on Past M, S, F history: cardiovascular risk factors include a history of hypertension and diet controlled diabetes  2. Physical Activities: no rigorous exercise activities, but is active with yard work  3. Depression/mood: no history of depression  4. Hearing: no hearing deficits  5. ADL's: completely active in all aspects of daily living  6. Fall Risk: low  7. Home Safety: no problems identified  8. Height, weight, &visual acuity:height and weight are stable. Has been recently diagnosed with glaucoma and is followed closely by ophthalmology  9. Counseling: modest weight loss encouraged. Screening colonoscopy. Discussed  10. Labs ordered based on risk factors: complete laboratory profile, including lipid panel, PSA will be reviewed  11. Referral Coordination- will follow-up with his orthopedic physician due to his low back pain  12. Care Plan- heart healthy diet modest weight loss and exercise regimen discussed. Screening colonoscopy. Also discussed  13. Cognitive Assessment- alert and oriented, with normal affect  Preventive Screening-Counseling & Management  Alcohol-Tobacco  Smoking Status: never  Allergies (verified):  No Known Drug  Allergies  Past History:  Past Medical History:  Diabetes mellitus, type II  GERD  Hypertension  Benign prostatic hypertrophy  Low back pain  Osteoarthritis  history of EtOH  Allergic rhinitis  Gl[facility] ([facility])  Past Surgical History:  Inguinal herniorrhaphy x 2  Rotator cuff repair 2002  Flexible Sigmoidoscopy 20 yrs ago  status post laminectomy March 2005  history retinal detachment  Cataract extraction  Family History:  Reviewed history from 12/05/2008 and no changes required.  Family History of Alcoholism/Addiction  Family History of Colon CA 1st degree relative <60  Family History Hypertension  Family History of Sudden Death  Father died age 31-unclear causes  Mother died age 32 Lung Ca  One brother age 18-DJD  One Sister died age 99- ETOH  Social History:  Reviewed history from 12/05/2008 and no changes required.  Retired  Single  Regular exercise-no  Never Smoked  MOD to heavy 2-3      Review of Systems  Constitutional: Negative for fever, chills, activity change, appetite change and fatigue.  HENT: Negative for hearing loss, ear pain, congestion, rhinorrhea, sneezing, mouth sores, trouble swallowing, neck pain, neck stiffness, dental problem, voice change, sinus pressure and tinnitus.   Eyes: Negative for photophobia, pain, redness and visual disturbance.  Respiratory: Negative for apnea, cough, choking, chest tightness, shortness of breath and wheezing.   Cardiovascular: Negative for chest pain, palpitations and leg swelling.  Gastrointestinal: Negative for nausea, vomiting, abdominal pain, diarrhea, constipation, blood in stool, abdominal distention, anal bleeding and rectal pain.  Genitourinary: Negative for dysuria, urgency, frequency, hematuria, flank pain, decreased urine volume, discharge, penile swelling, scrotal  swelling, difficulty urinating, genital sores and testicular pain.  Musculoskeletal: Negative for myalgias, back pain,  joint swelling, arthralgias and gait problem.  Skin: Negative for color change, rash and wound.  Neurological: Negative for dizziness, tremors, seizures, syncope, facial asymmetry, speech difficulty, weakness, light-headedness, numbness and headaches.  Hematological: Negative for adenopathy. Does not bruise/bleed easily.  Psychiatric/Behavioral: Negative for suicidal ideas, hallucinations, behavioral problems, confusion, sleep disturbance, self-injury, dysphoric mood, decreased concentration and agitation. The patient is not nervous/anxious.        Objective:   Physical Exam  Constitutional: He appears well-developed and well-nourished.  HENT:  Head: Normocephalic and atraumatic.  Right Ear: External ear normal.  Left Ear: External ear normal.  Nose: Nose normal.  Mouth/Throat: Oropharynx is clear and moist.  Eyes: Conjunctivae and EOM are normal. Pupils are equal, round, and reactive to light. No scleral icterus.  Neck: Normal range of motion. Neck supple. No JVD present. No thyromegaly present.  Cardiovascular: Regular rhythm, normal heart sounds and intact distal pulses.  Exam reveals no gallop and no friction rub.   No murmur heard. Pulmonary/Chest: Effort normal and breath sounds normal. He exhibits no tenderness.  Abdominal: Soft. Bowel sounds are normal. He exhibits no distension and no mass. There is no tenderness.  Genitourinary: Penis normal.       Prostate +2 enlarged  Musculoskeletal: Normal range of motion. He exhibits edema. He exhibits no tenderness.       +1 ankle edema. Full peripheral pulses  Lymphadenopathy:    He has no cervical adenopathy.  Neurological: He is alert. He has normal reflexes. No cranial nerve deficit. Coordination normal.  Skin: Skin is warm and dry. No rash noted.  Psychiatric: He has a normal mood and affect. His behavior is normal.          Assessment & Plan:  Gastroesophageal reflux disease with worsening dysphasia. Will set up a barium  swallow Hypertension well controlled  Osteoarthritis. Followup orthopedics  BPH stable  Annual clinical exam   Laboratory and it will be reviewed that weight loss all encouraged. Will recheck 6 months

## 2010-12-23 ENCOUNTER — Other Ambulatory Visit (HOSPITAL_COMMUNITY): Payer: Self-pay | Admitting: Internal Medicine

## 2010-12-31 ENCOUNTER — Ambulatory Visit (HOSPITAL_COMMUNITY)
Admission: RE | Admit: 2010-12-31 | Discharge: 2010-12-31 | Disposition: A | Discharge status: Home / Self Care | Payer: Medicare Other | Source: Ambulatory Visit | Attending: Internal Medicine | Admitting: Internal Medicine

## 2010-12-31 DIAGNOSIS — R4702 Dysphasia: Secondary | ICD-10-CM

## 2010-12-31 DIAGNOSIS — K219 Gastro-esophageal reflux disease without esophagitis: Secondary | ICD-10-CM

## 2010-12-31 DIAGNOSIS — R1310 Dysphagia, unspecified: Secondary | ICD-10-CM | POA: Insufficient documentation

## 2011-01-10 ENCOUNTER — Encounter: Payer: Self-pay | Admitting: Internal Medicine

## 2011-01-24 ENCOUNTER — Telehealth: Payer: Self-pay | Admitting: Internal Medicine

## 2011-01-24 NOTE — Telephone Encounter (Signed)
patient  Is aware and will call back if he is interested in a GI referral.

## 2011-01-24 NOTE — Telephone Encounter (Signed)
patient  Calling for results of recent procedure

## 2011-01-24 NOTE — Telephone Encounter (Signed)
Please notify patient that the esophagus revealed spasm but no mechanical blockage cancer or stricture.  Notify patient that  serious pathology has been ruled out but  can refer  to GI for further evaluation if swallowing is a major problem

## 2011-01-24 NOTE — Telephone Encounter (Signed)
Pt called wanted to know what the next step is after his test result from 12/31/10... Pt called last night and left message just want to touch base because pt had not head anything since he received results.

## 2011-04-03 ENCOUNTER — Ambulatory Visit (INDEPENDENT_AMBULATORY_CARE_PROVIDER_SITE_OTHER): Payer: Medicare Other

## 2011-04-03 DIAGNOSIS — Z23 Encounter for immunization: Secondary | ICD-10-CM

## 2011-05-13 DIAGNOSIS — H409 Unspecified glaucoma: Secondary | ICD-10-CM | POA: Insufficient documentation

## 2011-05-13 DIAGNOSIS — H3020 Posterior cyclitis, unspecified eye: Secondary | ICD-10-CM | POA: Insufficient documentation

## 2011-05-13 DIAGNOSIS — H3530 Unspecified macular degeneration: Secondary | ICD-10-CM | POA: Insufficient documentation

## 2011-06-11 ENCOUNTER — Encounter: Payer: Self-pay | Admitting: Internal Medicine

## 2011-06-11 ENCOUNTER — Ambulatory Visit (INDEPENDENT_AMBULATORY_CARE_PROVIDER_SITE_OTHER): Payer: Medicare Other | Admitting: Internal Medicine

## 2011-06-11 DIAGNOSIS — M545 Low back pain: Secondary | ICD-10-CM

## 2011-06-11 DIAGNOSIS — M1990 Unspecified osteoarthritis, unspecified site: Secondary | ICD-10-CM

## 2011-06-11 DIAGNOSIS — I10 Essential (primary) hypertension: Secondary | ICD-10-CM

## 2011-06-11 DIAGNOSIS — N529 Male erectile dysfunction, unspecified: Secondary | ICD-10-CM

## 2011-06-11 DIAGNOSIS — E119 Type 2 diabetes mellitus without complications: Secondary | ICD-10-CM

## 2011-06-11 LAB — GLUCOSE, POCT (MANUAL RESULT ENTRY): POC Glucose: 146

## 2011-06-11 MED ORDER — TRIAMCINOLONE ACETONIDE 0.1 % EX CREA: TOPICAL_CREAM | Freq: Two times a day (BID) | CUTANEOUS | Status: DC

## 2011-06-11 MED ORDER — BENAZEPRIL-HYDROCHLOROTHIAZIDE 20-12.5 MG PO TABS: 1.0000 | ORAL_TABLET | Freq: Every day | ORAL | Status: DC

## 2011-06-11 MED ORDER — TADALAFIL 20 MG PO TABS: 20.0000 mg | ORAL_TABLET | Freq: Every day | ORAL | Status: DC | PRN

## 2011-06-11 NOTE — Progress Notes (Signed)
  Subjective:    Patient ID: Timothy Kim, male    DOB: 28-Jun-1929, 77 y.o.   MRN: [id number]  HPI  77 year old patient who is seen today for followup. He has a history of hypertension and osteoarthritis with chronic low back pain. He is doing quite well. He does have a history of ADD and has used Cialis with benefit. No major concerns or complaints except for a rash involving his right lower medial leg.    Review of Systems  Constitutional: Negative for fever, chills, appetite change and fatigue.  HENT: Negative for hearing loss, ear pain, congestion, sore throat, trouble swallowing, neck stiffness, dental problem, voice change and tinnitus.   Eyes: Negative for pain, discharge and visual disturbance.  Respiratory: Negative for cough, chest tightness, wheezing and stridor.   Cardiovascular: Negative for chest pain, palpitations and leg swelling.  Gastrointestinal: Negative for nausea, vomiting, abdominal pain, diarrhea, constipation, blood in stool and abdominal distention.  Genitourinary: Negative for urgency, hematuria, flank pain, discharge, difficulty urinating and genital sores.  Musculoskeletal: Positive for back pain and arthralgias. Negative for myalgias, joint swelling and gait problem.  Skin: Positive for rash.  Neurological: Negative for dizziness, syncope, speech difficulty, weakness, numbness and headaches.  Hematological: Negative for adenopathy. Does not bruise/bleed easily.  Psychiatric/Behavioral: Negative for behavioral problems and dysphoric mood. The patient is not nervous/anxious.        Objective:   Physical Exam  Constitutional: He is oriented to person, place, and time. He appears well-developed.       Blood pressure 120/80  HENT:  Head: Normocephalic.  Right Ear: External ear normal.  Left Ear: External ear normal.  Eyes: Conjunctivae and EOM are normal.  Neck: Normal range of motion.  Cardiovascular: Normal rate and normal heart sounds.   Pulmonary/Chest:  Breath sounds normal.  Abdominal: Bowel sounds are normal.  Musculoskeletal: Normal range of motion. He exhibits no edema and no tenderness.  Neurological: He is alert and oriented to person, place, and time.  Skin: Rash noted.       A dry flaky dermatitis noted involving his right lower extremity medial aspect. Stasis changes noted  Psychiatric: He has a normal mood and affect. His behavior is normal.          Assessment & Plan:   Hypertension well controlled Osteoarthritis with chronic low back pain Stasis dermatitis  We'll continue present regimen. Was given a prescription for triamcinolone for his stasis dermatitis Recheck 6 months for his annual exam

## 2011-06-11 NOTE — Patient Instructions (Signed)
Limit your sodium (Salt) intake  Moisturizing lotion to the dry scaly rash twice daily  Return in 6 months for follow-up

## 2011-08-20 ENCOUNTER — Other Ambulatory Visit: Payer: Self-pay | Admitting: Internal Medicine

## 2011-10-01 DIAGNOSIS — H4011X0 Primary open-angle glaucoma, stage unspecified: Secondary | ICD-10-CM | POA: Diagnosis not present

## 2011-10-01 DIAGNOSIS — H3530 Unspecified macular degeneration: Secondary | ICD-10-CM | POA: Diagnosis not present

## 2011-10-01 DIAGNOSIS — H40029 Open angle with borderline findings, high risk, unspecified eye: Secondary | ICD-10-CM | POA: Diagnosis not present

## 2011-10-01 DIAGNOSIS — H409 Unspecified glaucoma: Secondary | ICD-10-CM | POA: Diagnosis not present

## 2011-10-15 DIAGNOSIS — Z961 Presence of intraocular lens: Secondary | ICD-10-CM | POA: Diagnosis not present

## 2011-10-15 DIAGNOSIS — H43819 Vitreous degeneration, unspecified eye: Secondary | ICD-10-CM | POA: Diagnosis not present

## 2011-10-15 DIAGNOSIS — H4011X0 Primary open-angle glaucoma, stage unspecified: Secondary | ICD-10-CM | POA: Diagnosis not present

## 2011-10-15 DIAGNOSIS — H33059 Total retinal detachment, unspecified eye: Secondary | ICD-10-CM | POA: Diagnosis not present

## 2011-10-15 DIAGNOSIS — H409 Unspecified glaucoma: Secondary | ICD-10-CM | POA: Diagnosis not present

## 2011-10-15 DIAGNOSIS — H02409 Unspecified ptosis of unspecified eyelid: Secondary | ICD-10-CM | POA: Diagnosis not present

## 2011-10-15 DIAGNOSIS — H353290 Exudative age-related macular degeneration, unspecified eye, stage unspecified: Secondary | ICD-10-CM | POA: Diagnosis not present

## 2011-10-15 DIAGNOSIS — H43399 Other vitreous opacities, unspecified eye: Secondary | ICD-10-CM | POA: Diagnosis not present

## 2011-10-30 DIAGNOSIS — H43819 Vitreous degeneration, unspecified eye: Secondary | ICD-10-CM | POA: Diagnosis not present

## 2011-10-30 DIAGNOSIS — H43399 Other vitreous opacities, unspecified eye: Secondary | ICD-10-CM | POA: Diagnosis not present

## 2011-10-30 DIAGNOSIS — H35369 Drusen (degenerative) of macula, unspecified eye: Secondary | ICD-10-CM | POA: Diagnosis not present

## 2012-01-02 ENCOUNTER — Encounter: Payer: Self-pay | Admitting: Internal Medicine

## 2012-01-07 ENCOUNTER — Ambulatory Visit: Payer: Medicare Other

## 2012-01-07 ENCOUNTER — Encounter: Payer: Self-pay | Admitting: Internal Medicine

## 2012-01-07 ENCOUNTER — Ambulatory Visit (INDEPENDENT_AMBULATORY_CARE_PROVIDER_SITE_OTHER): Payer: Medicare Other | Admitting: Internal Medicine

## 2012-01-07 VITALS — BP 120/78 | HR 66 | Temp 98.0°F | Resp 16 | Ht 66.0 in | Wt 174.0 lb

## 2012-01-07 DIAGNOSIS — D72829 Elevated white blood cell count, unspecified: Secondary | ICD-10-CM

## 2012-01-07 DIAGNOSIS — M545 Low back pain: Secondary | ICD-10-CM | POA: Diagnosis not present

## 2012-01-07 DIAGNOSIS — M1990 Unspecified osteoarthritis, unspecified site: Secondary | ICD-10-CM | POA: Diagnosis not present

## 2012-01-07 DIAGNOSIS — I10 Essential (primary) hypertension: Secondary | ICD-10-CM | POA: Diagnosis not present

## 2012-01-07 DIAGNOSIS — Z0000 Encounter for general adult medical examination without abnormal findings: Secondary | ICD-10-CM | POA: Diagnosis not present

## 2012-01-07 DIAGNOSIS — E119 Type 2 diabetes mellitus without complications: Secondary | ICD-10-CM | POA: Diagnosis not present

## 2012-01-07 LAB — COMPREHENSIVE METABOLIC PANEL
ALT: 28 U/L (ref 0–53)
Albumin: 4 g/dL (ref 3.5–5.2)
Alkaline Phosphatase: 59 U/L (ref 39–117)
Glucose, Bld: 98 mg/dL (ref 70–99)
Potassium: 4.1 mEq/L (ref 3.5–5.1)
Sodium: 143 mEq/L (ref 135–145)
Total Bilirubin: 1 mg/dL (ref 0.3–1.2)
Total Protein: 7.2 g/dL (ref 6.0–8.3)

## 2012-01-07 LAB — GLUCOSE, POCT (MANUAL RESULT ENTRY): POC Glucose: 88 mg/dl (ref 70–99)

## 2012-01-07 LAB — LIPID PANEL
HDL: 79.2 mg/dL (ref >39.00)
Total CHOL/HDL Ratio: 3
Triglycerides: 65 mg/dL (ref 0.0–149.0)

## 2012-01-07 LAB — CBC WITH DIFFERENTIAL/PLATELET
Basophils Relative: 0.6 % (ref 0.0–3.0)
Eosinophils Relative: 3.5 % (ref 0.0–5.0)
HCT: 43.6 % (ref 39.0–52.0)
Hemoglobin: 14.4 g/dL (ref 13.0–17.0)
MCHC: 33 g/dL (ref 30.0–36.0)
MCV: 108.3 fl — ABNORMAL HIGH (ref 78.0–100.0)
Platelets: 155 10*3/uL (ref 150.0–400.0)
RDW: 14.5 % (ref 11.5–14.6)

## 2012-01-07 LAB — HEMOGLOBIN A1C: Hgb A1c MFr Bld: 5.7 % (ref 4.6–6.5)

## 2012-01-07 LAB — LDL CHOLESTEROL, DIRECT: Direct LDL: 104.4 mg/dL

## 2012-01-07 MED ORDER — METOPROLOL SUCCINATE ER 100 MG PO TB24: 100.0000 mg | ORAL_TABLET | Freq: Every day | ORAL | Status: DC

## 2012-01-07 MED ORDER — TADALAFIL 20 MG PO TABS: 20.0000 mg | ORAL_TABLET | Freq: Every day | ORAL | Status: DC | PRN

## 2012-01-07 MED ORDER — SILDENAFIL CITRATE 100 MG PO TABS: 100.0000 mg | ORAL_TABLET | ORAL | Status: DC | PRN

## 2012-01-07 NOTE — Progress Notes (Signed)
Subjective:    Patient ID: Timothy Kim, male    DOB: 12/15/1929, 78 y.o.   MRN: [id number]  Hypertension Pertinent negatives include no chest pain, headaches, neck pain, palpitations or shortness of breath.    History of Present Illness:   78 year-old patient who is seen today for a comprehensive evaluation. He has a history of treated hypertension. He has additionally, gastroesophageal reflux disease, and a history of diet-controlled diabetes. He has osteoarthritis and chronic low back pain. He has quite well except for his chronic low back pain. For the past few weeks he has had some worsening reflux symptoms and some mild solid food dysphagia. He has had worsening low back pain and has scheduled orthopedic followup. He continues to be followed annually at [facility] for eye care and also locally by Dr. Dione Booze.  Here for  Medicare AWV:  1. Risk factors based on Past M, S, F history: cardiovascular risk factors include a history of hypertension and diet controlled diabetes  2. Physical Activities: no rigorous exercise activities, but is active with yard work  3. Depression/mood: no history of depression  4. Hearing: no hearing deficits  5. ADL's: completely active in all aspects of daily living  6. Fall Risk: low  7. Home Safety: no problems identified  8. Height, weight, &visual acuity:height and weight are stable. Has been recently diagnosed with glaucoma and is followed closely by ophthalmology  9. Counseling: modest weight loss encouraged. Screening colonoscopy. Discussed  10. Labs ordered based on risk factors: complete laboratory profile, including lipid panel, PSA will be reviewed  11. Referral Coordination- will follow-up with his orthopedic physician due to his low back pain  12. Care Plan- heart healthy diet modest weight loss and exercise regimen discussed. Screening colonoscopy. Also discussed  13. Cognitive Assessment- alert and oriented, with normal affect   Preventive  Screening-Counseling & Management  Alcohol-Tobacco  Smoking Status: never   Allergies (verified):  No Known Drug Allergies   Past History:  Past Medical History:  Diabetes mellitus, type II  GERD  Hypertension  Benign prostatic hypertrophy  Low back pain  Osteoarthritis  history of EtOH  Allergic rhinitis  Gl[facility] ([facility])   Past Surgical History:  Inguinal herniorrhaphy x 2  Rotator cuff repair 2002  Flexible Sigmoidoscopy 20 yrs ago  status post laminectomy March 2005  history retinal detachment  Cataract extraction   Family History:  Reviewed history from 12/05/2008 and no changes required.  Family History of Alcoholism/Addiction  Family History of Colon CA 1st degree relative <60  Family History Hypertension  Family History of Sudden Death  Father died age 52-unclear causes  Mother died age 37 Lung Ca  One brother age 38-DJD  One Sister died age 41- ETOH   Social History:  Reviewed history from 12/05/2008 and no changes required.  Retired  Single  Regular exercise-no  Never Smoked  MOD to heavy 2-3   Past Medical History  Diagnosis Date  . ALLERGIC RHINITIS 02/09/2008    Qualifier: Diagnosis of  By: Amador Cunas  MD, Janett Labella   . BENIGN PROSTATIC HYPERTROPHY 03/03/2007    Qualifier: Diagnosis of  By: Claiborne Billings CMA, Terance Ice    . DIABETES MELLITUS, TYPE II 03/03/2007    Qualifier: Diagnosis of  By: Claiborne Billings CMA, Terance Ice    . GERD 03/03/2007    Qualifier: Diagnosis of  By: Claiborne Billings CMA, Terance Ice    . HYPERTENSION 03/03/2007    Qualifier: Diagnosis of  By: Claiborne Billings  CMA, Jacqualynn    . LOW BACK PAIN 06/09/2007    Qualifier: Diagnosis of  By: Amador Cunas  MD, Janett Labella   . 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 OSTEOARTHRITIS 06/09/2007    Qualifier: Diagnosis of  By: Amador Cunas  MD, Janett Labella Glaucoma     History   Social History  . Marital Status: Single    Spouse Name:  N/A    Number of Children: N/A  . Years of Education: N/A   Occupational History  . Not on file.   Social History Main Topics  . Smoking status: Former Games developer  . Smokeless tobacco: Never Used  . Alcohol Use: Not on file  . Drug Use: Not on file  . Sexually Active: Not on file   Other Topics Concern  . Not on file   Social History Narrative  . No narrative on file    Past Surgical History  Procedure Date  . Hernia repair     inguinal  . Rotator cuff repair   . Laminectomy   . Retinal detachment surgery   . Cataract extraction     Family History  Problem Relation Age of Onset  . Cancer Mother     lung ca  . Alcohol abuse Sister   . Hypertension Neg Hx     family hx  . Sudden death Neg Hx     famiylhx    No Known Allergies  Current Outpatient Prescriptions on File Prior to Visit  Medication Sig Dispense Refill  . aspirin 500 MG tablet Take 1,000 mg by mouth daily.        . brimonidine-timolol (COMBIGAN) 0.2-0.5 % ophthalmic solution Place 1 drop into both eyes every 12 (twelve) hours.        . Omega-3 Fatty Acids (FISH OIL) 1000 MG CAPS Take by mouth daily.        Marland Kitchen triamcinolone cream (KENALOG) 0.1 % Apply topically 2 (two) times daily.  85.2 g  1  . DISCONTD: metoprolol succinate (TOPROL-XL) 100 MG 24 hr tablet TAKE 1 TABLET BY MOUTH EVERY DAY  90 tablet  3  . DISCONTD: tadalafil (CIALIS) 20 MG tablet Take 1 tablet (20 mg total) by mouth daily as needed.  10 tablet  6  . DISCONTD: metoprolol succinate (TOPROL-XL) 100 MG 24 hr tablet         BP 120/78  Pulse 66  Temp 98 F (36.7 C) (Oral)  Resp 16  Ht 5\' 6"  (1.676 m)  Wt 174 lb (78.926 kg)  BMI 28.08 kg/m2  SpO2 96%      Review of Systems  Constitutional: Negative for fever, chills, activity change, appetite change and fatigue.  HENT: Negative for hearing loss, ear pain, congestion, rhinorrhea, sneezing, mouth sores, trouble swallowing, neck pain, neck stiffness, dental problem, voice change, sinus  pressure and tinnitus.   Eyes: Negative for photophobia, pain, redness and visual disturbance.  Respiratory: Negative for apnea, cough, choking, chest tightness, shortness of breath and wheezing.   Cardiovascular: Negative for chest pain, palpitations and leg swelling.  Gastrointestinal: Negative for nausea, vomiting, abdominal pain, diarrhea, constipation, blood in stool, abdominal distention, anal bleeding and rectal pain.  Genitourinary: Negative for dysuria, urgency, frequency, hematuria, flank pain, decreased urine volume, discharge, penile swelling, scrotal swelling, difficulty urinating, genital sores and testicular pain.  Musculoskeletal: Negative for myalgias, back pain, joint swelling, arthralgias and gait problem.  Skin: Negative  for color change, rash and wound.  Neurological: Negative for dizziness, tremors, seizures, syncope, facial asymmetry, speech difficulty, weakness, light-headedness, numbness and headaches.  Hematological: Negative for adenopathy. Does not bruise/bleed easily.  Psychiatric/Behavioral: Negative for suicidal ideas, hallucinations, behavioral problems, confusion, disturbed wake/sleep cycle, self-injury, dysphoric mood, decreased concentration and agitation. The patient is not nervous/anxious.        Objective:   Physical Exam  Constitutional: He appears well-developed and well-nourished.  HENT:  Head: Normocephalic and atraumatic.  Right Ear: External ear normal.  Left Ear: External ear normal.  Nose: Nose normal.  Mouth/Throat: Oropharynx is clear and moist.  Eyes: Conjunctivae and EOM are normal. Pupils are equal, round, and reactive to light. No scleral icterus.  Neck: Normal range of motion. Neck supple. No JVD present. No thyromegaly present.  Cardiovascular: Regular rhythm, normal heart sounds and intact distal pulses.  Exam reveals no gallop and no friction rub.   No murmur heard. Pulmonary/Chest: Effort normal and breath sounds normal. He exhibits  no tenderness.  Abdominal: Soft. Bowel sounds are normal. He exhibits no distension and no mass. There is no tenderness.  Genitourinary: Penis normal.       Prostate +2 enlarged  Musculoskeletal: Normal range of motion. He exhibits edema. He exhibits no tenderness.       +1 ankle edema. Full peripheral pulses  Lymphadenopathy:    He has no cervical adenopathy.  Neurological: He is alert. He has normal reflexes. No cranial nerve deficit. Coordination normal.  Skin: Skin is warm and dry. No rash noted.  Psychiatric: He has a normal mood and affect. His behavior is normal.          Assessment & Plan:  Gastroesophageal reflux disease with worsening dysphasia. Will set up a barium swallow Hypertension well controlled  Osteoarthritis. Followup orthopedics  BPH stable  Annual clinical exam   Laboratory and it will be reviewed that weight loss all encouraged. Will recheck 6 months

## 2012-01-07 NOTE — Patient Instructions (Addendum)
Limit your sodium (Salt) intake    It is important that you exercise regularly, at least 20 minutes 3 to 4 times per week.  If you develop chest pain or shortness of breath seek  medical attention.  You need to lose weight.  Consider a lower calorie diet and regular exercise.  Return in one year for follow-up 

## 2012-01-08 LAB — PATHOLOGIST SMEAR REVIEW

## 2012-01-09 ENCOUNTER — Telehealth: Payer: Self-pay | Admitting: *Deleted

## 2012-01-09 NOTE — Progress Notes (Signed)
Quick Note:  Attempt to call hm# - VM - LMTCB need 6 mos rov to f/u on labs ______

## 2012-01-09 NOTE — Telephone Encounter (Signed)
Received phone call from downstairs lab regarding pathology results from 01/07/12 on patient.  Lab wanted to speak with Dr. Sherene Sires, as this is patients MD, so that Dr.Wert would be aware of results.  However, Dr Sherene Sires is not in office so will send to [facility] to advise from there.   Thank you  Result Notes     Notes Recorded by Gordy Savers, MD on 01/08/2012 at 6:14 PM Please ask patient to make a return office appointment in 6 months and for a followup CBC              Results Pathologist smear review (Order [id number])         Pathologist smear review Status: Final result MyChart: Not Released Next appt with me: None Dx: Leukocytosis, unspecified       Notes Recorded by Gordy Savers, MD on 01/08/2012 at 6:14 PM Please ask patient to make a return office appointment in 6 months and for a followup CBC      Component  Value    Tech Review      Comments:    Absolute lymphocytosis and atypical lymphocytes. Suggest immunophenotyping by flow cytometry if a new/persistent finding. Anemia, with RBC's which appear to be macrocytic on smear review. Suggest evaluation for vitamin deficiency, if clinically indicated.  Platelets are unremarkable. Reviewed by Nehemiah Massed Mammarappallil MD (Electronic Signature on File) 01/08/12

## 2012-01-09 NOTE — Telephone Encounter (Signed)
This is not something that has a 24hr urgency.  Would therefore send to wert for his evaluation.

## 2012-01-09 NOTE — Telephone Encounter (Signed)
This is Dr Gabriel Cirri patient and it appears he's addressed it.

## 2012-03-30 DIAGNOSIS — Z961 Presence of intraocular lens: Secondary | ICD-10-CM | POA: Diagnosis not present

## 2012-03-30 DIAGNOSIS — H02409 Unspecified ptosis of unspecified eyelid: Secondary | ICD-10-CM | POA: Diagnosis not present

## 2012-03-30 DIAGNOSIS — H4011X0 Primary open-angle glaucoma, stage unspecified: Secondary | ICD-10-CM | POA: Diagnosis not present

## 2012-03-30 DIAGNOSIS — H40029 Open angle with borderline findings, high risk, unspecified eye: Secondary | ICD-10-CM | POA: Diagnosis not present

## 2012-04-13 ENCOUNTER — Ambulatory Visit (INDEPENDENT_AMBULATORY_CARE_PROVIDER_SITE_OTHER): Payer: Medicare Other

## 2012-04-13 DIAGNOSIS — Z23 Encounter for immunization: Secondary | ICD-10-CM

## 2012-05-04 DIAGNOSIS — H35369 Drusen (degenerative) of macula, unspecified eye: Secondary | ICD-10-CM | POA: Diagnosis not present

## 2012-05-04 DIAGNOSIS — H43819 Vitreous degeneration, unspecified eye: Secondary | ICD-10-CM | POA: Diagnosis not present

## 2012-05-04 DIAGNOSIS — H43399 Other vitreous opacities, unspecified eye: Secondary | ICD-10-CM | POA: Diagnosis not present

## 2012-05-25 ENCOUNTER — Other Ambulatory Visit: Payer: Self-pay | Admitting: Internal Medicine

## 2012-07-07 ENCOUNTER — Ambulatory Visit (INDEPENDENT_AMBULATORY_CARE_PROVIDER_SITE_OTHER): Payer: Medicare Other | Admitting: Internal Medicine

## 2012-07-07 ENCOUNTER — Encounter: Payer: Self-pay | Admitting: Internal Medicine

## 2012-07-07 VITALS — BP 160/90 | HR 62 | Temp 97.9°F | Resp 18 | Wt 182.0 lb

## 2012-07-07 DIAGNOSIS — E119 Type 2 diabetes mellitus without complications: Secondary | ICD-10-CM

## 2012-07-07 DIAGNOSIS — M545 Low back pain, unspecified: Secondary | ICD-10-CM

## 2012-07-07 DIAGNOSIS — M1990 Unspecified osteoarthritis, unspecified site: Secondary | ICD-10-CM

## 2012-07-07 DIAGNOSIS — I10 Essential (primary) hypertension: Secondary | ICD-10-CM | POA: Diagnosis not present

## 2012-07-07 DIAGNOSIS — C9110 Chronic lymphocytic leukemia of B-cell type not having achieved remission: Secondary | ICD-10-CM | POA: Diagnosis not present

## 2012-07-07 MED ORDER — SILDENAFIL CITRATE 100 MG PO TABS: 100.0000 mg | ORAL_TABLET | ORAL | Status: DC | PRN

## 2012-07-07 NOTE — Patient Instructions (Addendum)
Limit your sodium (Salt) intake  You need to lose weight.  Consider a lower calorie diet and regular exercise.  Hematology and orthopedic followup as discussed  Return in 6 months for follow-up  Please check your blood pressure on a regular basis.  If it is consistently greater than 150/90, please make an office appointment.

## 2012-07-07 NOTE — Progress Notes (Signed)
Subjective:    Patient ID: Timothy Kim, male    DOB: 06/19/1930, 79 y.o.   MRN: [id number]  HPI  79 year old patient who is seen today for his six-month followup. He has a history of suspected CLL with lymphocytosis. His main complaint today is worsening low back pain. He has a history of osteoarthritis spinal stenosis and prior lumbar laminectomy in 2005. He's had worsening pain and is asking about the feasibility of epidurals for possible additional pain control. He has treated hypertension which has been well-controlled.  Past Medical History  Diagnosis Date  . ALLERGIC RHINITIS 02/09/2008    Qualifier: Diagnosis of  By: Amador Cunas  MD, Janett Labella   . BENIGN PROSTATIC HYPERTROPHY 03/03/2007    Qualifier: Diagnosis of  By: Claiborne Billings CMA, Terance Ice    . DIABETES MELLITUS, TYPE II 03/03/2007    Qualifier: Diagnosis of  By: Claiborne Billings CMA, Terance Ice    . GERD 03/03/2007    Qualifier: Diagnosis of  By: Claiborne Billings CMA, Terance Ice    . HYPERTENSION 03/03/2007    Qualifier: Diagnosis of  By: Claiborne Billings CMA, Terance Ice    . LOW BACK PAIN 06/09/2007    Qualifier: Diagnosis of  By: Amador Cunas  MD, Janett Labella   . 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 OSTEOARTHRITIS 06/09/2007    Qualifier: Diagnosis of  By: Amador Cunas  MD, Janett Labella Glaucoma     History   Social History  . Marital Status: Single    Spouse Name: N/A    Number of Children: N/A  . Years of Education: N/A   Occupational History  . Not on file.   Social History Main Topics  . Smoking status: Former Games developer  . Smokeless tobacco: Never Used  . Alcohol Use: Not on file  . Drug Use: Not on file  . Sexually Active: Not on file   Other Topics Concern  . Not on file   Social History Narrative  . No narrative on file    Past Surgical History  Procedure Date  . Hernia repair     inguinal  . Rotator cuff repair   . Laminectomy   . Retinal detachment surgery   . Cataract  extraction     Family History  Problem Relation Age of Onset  . Cancer Mother     lung ca  . Alcohol abuse Sister   . Hypertension Neg Hx     family hx  . Sudden death Neg Hx     famiylhx    No Known Allergies  Current Outpatient Prescriptions on File Prior to Visit  Medication Sig Dispense Refill  . benazepril-hydrochlorthiazide (LOTENSIN HCT) 20-12.5 MG per tablet       . brimonidine-timolol (COMBIGAN) 0.2-0.5 % ophthalmic solution Place 1 drop into both eyes every 12 (twelve) hours.        . metoprolol succinate (TOPROL-XL) 100 MG 24 hr tablet Take 1 tablet (100 mg total) by mouth daily. Take with or immediately following a meal.  90 tablet  3  . Omega-3 Fatty Acids (FISH OIL) 1000 MG CAPS Take by mouth daily.        . sildenafil (VIAGRA) 100 MG tablet Take 1 tablet (100 mg total) by mouth as needed for erectile dysfunction.  3 tablet  6  . tadalafil (CIALIS) 20 MG tablet Take 1 tablet (20 mg total) by mouth daily as needed.  16 tablet  6  . triamcinolone  cream (KENALOG) 0.1 % APPLY TO AFFECTED AREA TWICE DAILY  80 g  1    BP 160/90  Pulse 62  Temp 97.9 F (36.6 C) (Oral)  Resp 18  Wt 182 lb (82.555 kg)  SpO2 97%      BP Readings from Last 3 Encounters:  07/07/12 160/90  01/07/12 120/78  06/11/11 124/80    Review of Systems  Constitutional: Negative for fever, chills, appetite change and fatigue.  HENT: Negative for hearing loss, ear pain, congestion, sore throat, trouble swallowing, neck stiffness, dental problem, voice change and tinnitus.   Eyes: Negative for pain, discharge and visual disturbance.  Respiratory: Negative for cough, chest tightness, wheezing and stridor.   Cardiovascular: Negative for chest pain, palpitations and leg swelling.  Gastrointestinal: Negative for nausea, vomiting, abdominal pain, diarrhea, constipation, blood in stool and abdominal distention.  Genitourinary: Negative for urgency, hematuria, flank pain, discharge, difficulty  urinating and genital sores.  Musculoskeletal: Positive for back pain. Negative for myalgias, joint swelling, arthralgias and gait problem.  Skin: Negative for rash.  Neurological: Negative for dizziness, syncope, speech difficulty, weakness, numbness and headaches.  Hematological: Negative for adenopathy. Does not bruise/bleed easily.  Psychiatric/Behavioral: Negative for behavioral problems and dysphoric mood. The patient is not nervous/anxious.        Objective:   Physical Exam  Constitutional: He appears well-developed and well-nourished.       Blood pressure 150/80  HENT:  Head: Normocephalic and atraumatic.  Right Ear: External ear normal.  Left Ear: External ear normal.  Nose: Nose normal.  Mouth/Throat: Oropharynx is clear and moist.  Eyes: Conjunctivae normal and EOM are normal. Pupils are equal, round, and reactive to light. No scleral icterus.  Neck: Normal range of motion. Neck supple. No JVD present. No thyromegaly present.  Cardiovascular: Regular rhythm, normal heart sounds and intact distal pulses.  Exam reveals no gallop and no friction rub.   No murmur heard. Pulmonary/Chest: Effort normal and breath sounds normal. He exhibits no tenderness.  Abdominal: Soft. Bowel sounds are normal. He exhibits no distension and no mass. There is no tenderness.  Genitourinary: Prostate normal and penis normal.  Musculoskeletal: Normal range of motion. He exhibits no edema and no tenderness.  Lymphadenopathy:    He has no cervical adenopathy.  Neurological: He is alert. He has normal reflexes. No cranial nerve deficit. Coordination normal.  Skin: Skin is warm and dry. No rash noted.  Psychiatric: He has a normal mood and affect. His behavior is normal.          Assessment & Plan:  Probable CLL. Will check a CBC today ; we'll set up for hematology consultation Chronic low back pain. We'll set up for orthopedic evaluation Hypertension.  home blood pressure monitoring  encouraged;  may need dose titration  Recheck 6 months

## 2012-07-08 ENCOUNTER — Telehealth: Payer: Self-pay | Admitting: Oncology

## 2012-07-08 NOTE — Telephone Encounter (Signed)
S/W pt in re NP appt 01/29 @ 1:30 w/Dr. Cyndie Chime Referring Dr. Amador Cunas Dx- CLL  Welcome packet mailed.

## 2012-07-12 ENCOUNTER — Other Ambulatory Visit: Payer: Self-pay | Admitting: Oncology

## 2012-07-12 DIAGNOSIS — C9110 Chronic lymphocytic leukemia of B-cell type not having achieved remission: Secondary | ICD-10-CM

## 2012-07-21 ENCOUNTER — Ambulatory Visit: Payer: Medicare Other

## 2012-07-21 ENCOUNTER — Encounter: Payer: Self-pay | Admitting: Oncology

## 2012-07-21 ENCOUNTER — Other Ambulatory Visit (HOSPITAL_COMMUNITY)
Admission: RE | Admit: 2012-07-21 | Discharge: 2012-07-21 | Disposition: A | Discharge status: Home / Self Care | Payer: Medicare Other | Source: Ambulatory Visit | Attending: Oncology | Admitting: Oncology

## 2012-07-21 ENCOUNTER — Ambulatory Visit (HOSPITAL_BASED_OUTPATIENT_CLINIC_OR_DEPARTMENT_OTHER): Payer: Medicare Other | Admitting: Oncology

## 2012-07-21 ENCOUNTER — Telehealth: Payer: Self-pay | Admitting: Oncology

## 2012-07-21 ENCOUNTER — Other Ambulatory Visit (HOSPITAL_BASED_OUTPATIENT_CLINIC_OR_DEPARTMENT_OTHER): Payer: Medicare Other | Admitting: Lab

## 2012-07-21 VITALS — BP 169/91 | HR 67 | Temp 97.8°F | Resp 20 | Ht 66.0 in | Wt 177.0 lb

## 2012-07-21 DIAGNOSIS — D72829 Elevated white blood cell count, unspecified: Secondary | ICD-10-CM

## 2012-07-21 DIAGNOSIS — D7589 Other specified diseases of blood and blood-forming organs: Secondary | ICD-10-CM

## 2012-07-21 DIAGNOSIS — C9110 Chronic lymphocytic leukemia of B-cell type not having achieved remission: Secondary | ICD-10-CM

## 2012-07-21 LAB — LACTATE DEHYDROGENASE (CC13): LDH: 196 U/L (ref 125–245)

## 2012-07-21 LAB — MORPHOLOGY: PLT EST: ADEQUATE

## 2012-07-21 LAB — CBC & DIFF AND RETIC
BASO%: 0.4 % (ref 0.0–2.0)
EOS%: 2.2 % (ref 0.0–7.0)
HCT: 41.9 % (ref 38.4–49.9)
LYMPH%: 62.6 % — ABNORMAL HIGH (ref 14.0–49.0)
MCH: 33.8 pg — ABNORMAL HIGH (ref 27.2–33.4)
MCHC: 32.9 g/dL (ref 32.0–36.0)
MONO#: 0.7 10*3/uL (ref 0.1–0.9)
NEUT%: 29.6 % — ABNORMAL LOW (ref 39.0–75.0)
Platelets: 197 10*3/uL (ref 140–400)
RBC: 4.08 10*6/uL — ABNORMAL LOW (ref 4.20–5.82)
Retic %: 1.56 % (ref 0.80–1.80)
WBC: 14 10*3/uL — ABNORMAL HIGH (ref 4.0–10.3)
lymph#: 8.7 10*3/uL — ABNORMAL HIGH (ref 0.9–3.3)

## 2012-07-21 LAB — CHCC SMEAR

## 2012-07-21 NOTE — Patient Instructions (Signed)
We will check blood counts every 6 months for now - with Dr Kirtland Bouchard in July, 2014; with Dr Reece Agar, in Jan 2015 Make sure to stay current on your pneumonia vaccine and get a flu shot every year

## 2012-07-21 NOTE — Telephone Encounter (Signed)
appts made and printed for pt aom °

## 2012-07-21 NOTE — Progress Notes (Signed)
Checked in new patient. No financial issues. °

## 2012-07-21 NOTE — Progress Notes (Signed)
New Patient Hematology-Oncology Evaluation   Timothy Kim [id number] 1929/12/30 79 y.o. 07/21/2012  CC: Dr. Eleonore Chiquito   Reason for referral: Probable chronic lymphocytic leukemia   HPI: The patient evaluation for this pleasant 79 year old man who is retired from [employer] business. For the past 5 years, he has been noted to have a mild leukocytosis up to total white count 11,000 with an associated lymphocytosis usually about 60% of the differential. A CBC recorded on 12/12/2009 with hemoglobin 14.7, hematocrit 43, MCV 101, white count 10,800, 29% neutrophils, 60% lymphocytes, 8% monocytes. Calcium stayed stable at these levels through most recent count done 01/07/2012 when hemoglobin was 14.4 hematocrit 43.6, MCV 108, white count 11,000, 22% neutrophils, 16% lymphocytes, platelets 155,000. Blood counts in our office today with hemoglobin 13.8 hematocrit 41.9 MCV 103, white count 14,000, 30% neutrophils, 63% lymphocytes, platelets 197,000. The patient is not noted any swollen glands. He denies any fevers, weight loss, or night sweats. He does not get frequent or severe infections. He has no history of exposure to organic chemicals, high-dose insecticides, or therapeutic radiation. There is no family history of any blood disorder.   PMH: Past Medical History  Diagnosis Date  . ALLERGIC RHINITIS 02/09/2008    Qualifier: Diagnosis of  By: Amador Cunas  MD, Janett Labella   . BENIGN PROSTATIC HYPERTROPHY 03/03/2007    Qualifier: Diagnosis of  By: Claiborne Billings CMA, Terance Ice    . DIABETES MELLITUS, TYPE II 03/03/2007    Qualifier: Diagnosis of  By: Claiborne Billings CMA, Terance Ice    . GERD 03/03/2007    Qualifier: Diagnosis of  By: Claiborne Billings CMA, Terance Ice    . HYPERTENSION 03/03/2007    Qualifier: Diagnosis of  By: Claiborne Billings CMA, Terance Ice    . LOW BACK PAIN 06/09/2007    Qualifier: Diagnosis of  By: Amador Cunas  MD, Janett Labella   . 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 OSTEOARTHRITIS 06/09/2007    Qualifier: Diagnosis of  By: Amador Cunas  MD, Janett Labella Glaucoma   He denies any history of MI, he states he is not currently taking any medication for his sugar, no history of ulcers, asthma, emphysema, tuberculosis, hepatitis, yellow jaundice, malaria, kidney stones, thyroid trouble, seizure, stroke, blood clots, or excessive bleeding after surgery. Told his prostate was mildly enlarged.  Past Surgical History  Procedure Date  . Hernia repair     inguinal-bilateral   . Rotator cuff repair-right    . Laminectomy for spinal stenosis   2005   . Retinal detachment surgery  2002   . Cataract extraction-bilateral      Allergies: No Known Allergies  Medications: Lotensin HCT 20-12.5 one daily, count began eyedrops 0.2-0.5% one drop both eyes every 12 hours. TAH E8 50 mg one daily, Lutein-zeaxanthin 15-0.7 mg one daily, Toprol-XL 100 mg daily, Omega fish WAS 1000 mg daily, Viagra 100 mg when necessary, [name] 20 mg when necessary, Kenalog cream 0.1% twice a day when necessary   Social History:  He lives with a steady male companion. Retired from [employer]. Currently a nonsmoker. He drinks 2-3 vodka or gin drinks daily. If I remember correctly, he has a son and a daughter who are healthy. Family History: Family History  Problem Relation Age of Onset  . Cancer Mother     lung ca  . Alcohol abuse Sister   . Hypertension Neg Hx     family hx  .  Sudden death Neg Hx     famiylhx  His sister died of complications of alcoholic cirrhosis. A brother is alive and well.  Review of Systems: Constitutional symptoms: No constitutional symptoms HEENT: No sore throat Respiratory: No cough or dyspnea Cardiovascular:  No chest pain or palpitations Gastrointestinal ROS: No change in bowel habits, no hematochezia or melena Genito-Urinary ROS: Erectile dysfunction Hematological and Lymphatic: He has not noticed any swollen  glands Musculoskeletal: No muscle bone or joint pain Neurologic: No headache or change in vision, no paresthesias Dermatologic: He has a chronic dermatitis. It sounds like he initially had a left thoracic zoster infection. This resolved but he has had recurrent patchy areas of dermatitis treated with topical Kenalog cream Remaining ROS negative.  Physical Exam: Blood pressure 169/91, pulse 67, temperature 97.8 F (36.6 C), temperature source Oral, resp. rate 20, height 5\' 6"  (1.676 m), weight 177 lb (80.287 kg). Wt Readings from Last 3 Encounters:  07/21/12 177 lb (80.287 kg)  07/07/12 182 lb (82.555 kg)  01/07/12 174 lb (78.926 kg)    General appearance: Well-nourished Caucasian man looks younger than his stated age Head: Normal Neck: Normal Lymph nodes: Single biaxillary lymph node about 2 cm in diameter, no cervical or supraclavicular adenopathy. No inguinal adenopathy Breasts: Lungs: Clear to auscultation resonant to percussion Heart: Regular rhythm no murmur Abdominal: Soft, nontender, no mass, no organomegaly GU: Extremities: No edema, no calf tenderness Neurologic: I did not get a good look at the optic discs. PERRLA. Cranial nerves grossly normal. Motor strength 5 over 5. Reflexes absent symmetric at the knees 1+ symmetric at the biceps. Sensation is intact to vibration over the fingertips by tuning fork exam. Skin: Complete exam not done. Patchy dermatitis on the dorsum of his hands.    Lab Results: Lab Results  Component Value Date   WBC 14.0* 07/21/2012   HGB 13.8 07/21/2012   HCT 41.9 07/21/2012   MCV 102.7* 07/21/2012   PLT 197 07/21/2012     Chemistry      Component Value Date/Time   NA 143 01/07/2012 0936   K 4.1 01/07/2012 0936   CL 103 01/07/2012 0936   CO2 29 01/07/2012 0936   BUN 16 01/07/2012 0936   CREATININE 1.0 01/07/2012 0936      Component Value Date/Time   CALCIUM 9.3 01/07/2012 0936   ALKPHOS 59 01/07/2012 0936   AST 47* 01/07/2012 0936   ALT 28  01/07/2012 0936   BILITOT 1.0 01/07/2012 0936        Review of peripheral blood film: Normochromic normocytic red cells. Mature neutrophils. Normal platelets. Increased number of small, benign appearing lymphocytes. Occasional reactive lymphocytes of the large granular type. Occasional smudge cell.    Impression and Plan: #1. Rai Stage 1 chronic lymphocytic leukemia Diagnosis, prognosis, and discussion of the potential treatment options discussed at length with the patient and I gave him written information. He has early-stage disease and does not require treatment. His blood counts have changed little in 5 years. He will be in the subset of patients who may never need treatment. He is advised that he is at increased risk for infection in particular viral infections such as herpes zoster. He should stay current with his pneumonia vaccines and get an annual flu vaccine. At this time we do not advise the herpes zoster vaccine since it is a live virus and relatively contraindicated in somebody with an immune defect such as leukemia. I am going to complete his data base with  some additional studies including flow cytometry on the peripheral blood, reticulocyte count and Coombs test in view of the high incidence of immune hemolytic anemia associated with CLL, and immunoglobulin levels which are frequently suppressed in CLL.  Recommendation: Given the stability of his counts over time, I think that we can check his blood twice a year once in his primary care physician's office and once here. It is quite common for the white count to increase in an exaggerated fashion with even a simple infection in patients with underlying CLL. If however, the count gets high and does not come back to baseline I would certainly evaluate the patient sooner.  #2. Macrocytosis without anemia Likely related to daily alcohol intake      Levert Feinstein, MD 07/21/2012, 3:33 PM

## 2012-07-23 ENCOUNTER — Encounter: Payer: Self-pay | Admitting: Internal Medicine

## 2012-07-23 LAB — IMMUNOFIXATION ELECTROPHORESIS
IgG (Immunoglobin G), Serum: 1360 mg/dL (ref 650–1600)
Total Protein, Serum Electrophoresis: 7.1 g/dL (ref 6.0–8.3)

## 2012-07-23 LAB — DIRECT ANTIGLOBULIN TEST (NOT AT ARMC): DAT (Complement): NEGATIVE

## 2012-07-26 ENCOUNTER — Telehealth: Payer: Self-pay | Admitting: *Deleted

## 2012-07-26 LAB — FLOW CYTOMETRY

## 2012-07-26 NOTE — Telephone Encounter (Signed)
Spoke with pt.  Let him know that blood tests confirm CLL.  Will send lab reports to PCP who per pt. Is Dr. Eleonore Chiquito.

## 2012-07-29 ENCOUNTER — Telehealth: Payer: Self-pay | Admitting: *Deleted

## 2012-07-29 NOTE — Telephone Encounter (Signed)
Spoke to pt told him Dr. Amador Cunas is out of the office and I would check with your Oncologist regarding preventive measures and special diet for Leukemia. Pt verbalized understanding.

## 2012-08-10 ENCOUNTER — Other Ambulatory Visit: Payer: Self-pay | Admitting: Internal Medicine

## 2012-08-30 DIAGNOSIS — I10 Essential (primary) hypertension: Secondary | ICD-10-CM | POA: Diagnosis not present

## 2012-09-08 DIAGNOSIS — M25569 Pain in unspecified knee: Secondary | ICD-10-CM | POA: Diagnosis not present

## 2012-09-09 DIAGNOSIS — M549 Dorsalgia, unspecified: Secondary | ICD-10-CM | POA: Diagnosis not present

## 2012-09-28 DIAGNOSIS — Z961 Presence of intraocular lens: Secondary | ICD-10-CM | POA: Diagnosis not present

## 2012-09-28 DIAGNOSIS — H409 Unspecified glaucoma: Secondary | ICD-10-CM | POA: Diagnosis not present

## 2012-09-28 DIAGNOSIS — H4011X0 Primary open-angle glaucoma, stage unspecified: Secondary | ICD-10-CM | POA: Diagnosis not present

## 2012-09-28 DIAGNOSIS — H40029 Open angle with borderline findings, high risk, unspecified eye: Secondary | ICD-10-CM | POA: Diagnosis not present

## 2012-10-07 ENCOUNTER — Encounter: Payer: Self-pay | Admitting: Oncology

## 2012-10-08 ENCOUNTER — Telehealth: Payer: Self-pay | Admitting: Nutrition

## 2012-10-08 NOTE — Telephone Encounter (Signed)
Patient has questions about a healthy diet for CLL. I attempted to contact patient by telephone however there was no answer I was unable to leave a message. I will mail information to him on a healthy plant-based diet and general food safety along with my contact information for questions or concerns.

## 2012-10-13 ENCOUNTER — Encounter: Payer: Self-pay | Admitting: Nutrition

## 2012-10-13 DIAGNOSIS — M549 Dorsalgia, unspecified: Secondary | ICD-10-CM | POA: Diagnosis not present

## 2012-10-13 DIAGNOSIS — M961 Postlaminectomy syndrome, not elsewhere classified: Secondary | ICD-10-CM | POA: Diagnosis not present

## 2012-11-03 DIAGNOSIS — H43819 Vitreous degeneration, unspecified eye: Secondary | ICD-10-CM | POA: Diagnosis not present

## 2012-11-03 DIAGNOSIS — H35369 Drusen (degenerative) of macula, unspecified eye: Secondary | ICD-10-CM | POA: Diagnosis not present

## 2012-11-03 DIAGNOSIS — H353190 Nonexudative age-related macular degeneration, unspecified eye, stage unspecified: Secondary | ICD-10-CM | POA: Diagnosis not present

## 2012-11-25 ENCOUNTER — Encounter: Payer: Self-pay | Admitting: Oncology

## 2012-11-29 ENCOUNTER — Telehealth: Payer: Self-pay | Admitting: *Deleted

## 2012-11-29 NOTE — Telephone Encounter (Signed)
Pt notified per Dr. Cyndie Chime in response to a MyChart question that "1) His CLL is staying stable with a very slow growth pattern-no reason to be put on preventive treatment. 2) Even if spleen enlarges-this will not hurt him & is not an indication to treat.  Spleen is just a big lymph gland. & 3) Spleen may never enlarge."  Pt appreciated response.

## 2013-01-07 ENCOUNTER — Encounter: Payer: Self-pay | Admitting: Internal Medicine

## 2013-01-07 ENCOUNTER — Ambulatory Visit (INDEPENDENT_AMBULATORY_CARE_PROVIDER_SITE_OTHER): Payer: Medicare Other | Admitting: Internal Medicine

## 2013-01-07 VITALS — BP 124/80 | HR 78 | Temp 97.6°F | Resp 20 | Ht 66.0 in | Wt 174.0 lb

## 2013-01-07 DIAGNOSIS — Z0000 Encounter for general adult medical examination without abnormal findings: Secondary | ICD-10-CM

## 2013-01-07 DIAGNOSIS — I10 Essential (primary) hypertension: Secondary | ICD-10-CM | POA: Diagnosis not present

## 2013-01-07 DIAGNOSIS — M1990 Unspecified osteoarthritis, unspecified site: Secondary | ICD-10-CM | POA: Diagnosis not present

## 2013-01-07 DIAGNOSIS — M545 Low back pain: Secondary | ICD-10-CM

## 2013-01-07 DIAGNOSIS — C9110 Chronic lymphocytic leukemia of B-cell type not having achieved remission: Secondary | ICD-10-CM | POA: Diagnosis not present

## 2013-01-07 DIAGNOSIS — E119 Type 2 diabetes mellitus without complications: Secondary | ICD-10-CM

## 2013-01-07 LAB — CBC WITH DIFFERENTIAL/PLATELET
Eosinophils Absolute: 0.4 10*3/uL (ref 0.0–0.7)
Eosinophils Relative: 2.8 % (ref 0.0–5.0)
HCT: 43.7 % (ref 39.0–52.0)
Lymphs Abs: 8.2 10*3/uL — ABNORMAL HIGH (ref 0.7–4.0)
MCHC: 33.5 g/dL (ref 30.0–36.0)
MCV: 106.3 fl — ABNORMAL HIGH (ref 78.0–100.0)
Monocytes Absolute: 0.8 10*3/uL (ref 0.1–1.0)
Neutrophils Relative %: 35.5 % — ABNORMAL LOW (ref 43.0–77.0)
Platelets: 184 10*3/uL (ref 150.0–400.0)
WBC: 14.7 10*3/uL — ABNORMAL HIGH (ref 4.5–10.5)

## 2013-01-07 LAB — HEMOGLOBIN A1C: Hgb A1c MFr Bld: 5.9 % (ref 4.6–6.5)

## 2013-01-07 MED ORDER — TADALAFIL 20 MG PO TABS: 20.0000 mg | ORAL_TABLET | Freq: Every day | ORAL | Status: DC | PRN

## 2013-01-07 MED ORDER — BENAZEPRIL-HYDROCHLOROTHIAZIDE 20-12.5 MG PO TABS: ORAL_TABLET | ORAL | Status: DC

## 2013-01-07 MED ORDER — SILDENAFIL CITRATE 100 MG PO TABS: 100.0000 mg | ORAL_TABLET | ORAL | Status: DC | PRN

## 2013-01-07 MED ORDER — METOPROLOL SUCCINATE ER 100 MG PO TB24: 100.0000 mg | ORAL_TABLET | Freq: Every day | ORAL | Status: DC

## 2013-01-07 NOTE — Progress Notes (Signed)
Patient ID: Timothy Kim, male   DOB: [birth year], 79 y.o.   MRN: [id number]  Subjective:    Patient ID: Timothy Kim, male    DOB: Oct 23, 1929, 79 y.o.   MRN: [id number]  Hypertension Pertinent negatives include no chest pain, headaches, neck pain, palpitations or shortness of breath.    History of Present Illness:   79  year-old patient who is seen today for a comprehensive evaluation. He has a history of treated hypertension. He has additionally, gastroesophageal reflux disease, and a history of diet-controlled diabetes. He has osteoarthritis and chronic low back pain. He has quite well except for his chronic low back pain. For the past few weeks he has had some worsening reflux symptoms and some mild solid food dysphagia. He has had worsening low back pain and has scheduled orthopedic followup. He continues to be followed annually at [facility] for eye care and also locally by Dr. Dione Booze. He is now  followed by oncology for stage I CLL.  He has been evaluated by orthopedics and has had a epidural and also cortisone injection to his knee earlier this year.   Here for  Medicare AWV:  1. Risk factors based on Past M, S, F history: cardiovascular risk factors include a history of hypertension and diet controlled diabetes  2. Physical Activities: no rigorous exercise activities, but is active with yard work  3. Depression/mood: no history of depression  4. Hearing: no hearing deficits  5. ADL's: completely active in all aspects of daily living  6. Fall Risk: low  7. Home Safety: no problems identified  8. Height, weight, &visual acuity:height and weight are stable. Has been recently diagnosed with glaucoma and is followed closely by ophthalmology  9. Counseling: modest weight loss encouraged. Screening colonoscopy. Discussed  10. Labs ordered based on risk factors: complete laboratory profile, including lipid panel, PSA will be reviewed  11. Referral Coordination- will follow-up with his  orthopedic physician due to his low back pain  12. Care Plan- heart healthy diet modest weight loss and exercise regimen discussed. Screening colonoscopy. Also discussed  13. Cognitive Assessment- alert and oriented, with normal affect   Preventive Screening-Counseling & Management  Alcohol-Tobacco  Smoking Status: never   Allergies (verified):  No Known Drug Allergies   Past History:  Past Medical History:  Diabetes mellitus, type II  GERD  Hypertension  Benign prostatic hypertrophy  Low back pain  Osteoarthritis  history of EtOH  Allergic rhinitis  Glaucoma R Eye ([place] [facility])   Past Surgical History:  Inguinal herniorrhaphy x 2  Rotator cuff repair 2002  Flexible Sigmoidoscopy 20 yrs ago  status post laminectomy March 2005  history retinal detachment  Cataract extraction   Family History:  Reviewed history from 12/05/2008 and no changes required.  Family History of Alcoholism/Addiction  Family History of Colon CA 1st degree relative <60  Family History Hypertension  Family History of Sudden Death  Father died age 71-unclear causes  Mother died age 69 Lung Ca  One brother age 72-DJD  One Sister died age 29- ETOH   Social History:  Reviewed history from 12/05/2008 and no changes required.  Retired  Single  Regular exercise-no  Never Smoked  MOD to heavy 2-3   Past Medical History  Diagnosis Date  . ALLERGIC RHINITIS 02/09/2008    Qualifier: Diagnosis of  By: Amador Cunas  MD, Janett Labella   . BENIGN PROSTATIC HYPERTROPHY 03/03/2007    Qualifier: Diagnosis of  By: Claiborne Billings CMA, Terance Ice    .  DIABETES MELLITUS, TYPE II 03/03/2007    Qualifier: Diagnosis of  By: Claiborne Billings CMA, Terance Ice    . GERD 03/03/2007    Qualifier: Diagnosis of  By: Claiborne Billings CMA, Terance Ice    . HYPERTENSION 03/03/2007    Qualifier: Diagnosis of  By: Claiborne Billings CMA, Terance Ice    . LOW BACK PAIN 06/09/2007    Qualifier: Diagnosis of  By: Amador Cunas  MD, Janett Labella   . ORGANIC  IMPOTENCE 06/09/2007    Qualifier: Diagnosis of  By: Amador Cunas  MD, Janett Labella   . OSTEOARTHRITIS 06/09/2007    Qualifier: Diagnosis of  By: Amador Cunas  MD, Janett Labella Glaucoma     History   Social History  . Marital Status: Single    Spouse Name: N/A    Number of Children: N/A  . Years of Education: N/A   Occupational History  . Not on file.   Social History Main Topics  . Smoking status: Former Games developer  . Smokeless tobacco: Never Used  . Alcohol Use: Not on file  . Drug Use: Not on file  . Sexually Active: Not on file   Other Topics Concern  . Not on file   Social History Narrative  . No narrative on file    Past Surgical History  Procedure Laterality Date  . Hernia repair      inguinal  . Rotator cuff repair    . Laminectomy    . Retinal detachment surgery    . Cataract extraction      Family History  Problem Relation Age of Onset  . Cancer Mother     lung ca  . Alcohol abuse Sister   . Hypertension Neg Hx     family hx  . Sudden death Neg Hx     famiylhx    No Known Allergies  Current Outpatient Prescriptions on File Prior to Visit  Medication Sig Dispense Refill  . brimonidine-timolol (COMBIGAN) 0.2-0.5 % ophthalmic solution Place 1 drop into both eyes every 12 (twelve) hours.        Marland Kitchen DHEA 50 MG TABS Take 1 tablet by mouth daily.      . Lutein-Zeaxanthin 15-0.7 MG CAPS Take 1 tablet by mouth daily.      . Omega-3 Fatty Acids (FISH OIL) 1000 MG CAPS Take by mouth daily.        Marland Kitchen triamcinolone cream (KENALOG) 0.1 % APPLY TO AFFECTED AREA TWICE DAILY  80 g  1   No current facility-administered medications on file prior to visit.    BP 124/80  Pulse 78  Temp(Src) 97.6 F (36.4 C) (Oral)  Resp 20  Ht 5\' 6"  (1.676 m)  Wt 174 lb (78.926 kg)  BMI 28.1 kg/m2  SpO2 98%      Review of Systems  Constitutional: Positive for activity change, appetite change and fatigue. Negative for fever and chills.  HENT: Negative for hearing loss, ear pain,  congestion, rhinorrhea, sneezing, mouth sores, trouble swallowing, neck pain, neck stiffness, dental problem, voice change, sinus pressure and tinnitus.   Eyes: Negative for photophobia, pain, redness and visual disturbance.  Respiratory: Positive for cough. Negative for apnea, choking, chest tightness, shortness of breath and wheezing.   Cardiovascular: Negative for chest pain, palpitations and leg swelling.  Gastrointestinal: Negative for nausea, vomiting, abdominal pain, diarrhea, constipation, blood in stool, abdominal distention, anal bleeding and rectal pain.  Genitourinary: Negative for dysuria, urgency, frequency, hematuria, flank pain, decreased urine volume, discharge, penile swelling, scrotal swelling, difficulty urinating,  genital sores and testicular pain.  Musculoskeletal: Positive for back pain. Negative for myalgias, joint swelling, arthralgias and gait problem.  Skin: Negative for color change, rash and wound.  Neurological: Positive for weakness. Negative for dizziness, tremors, seizures, syncope, facial asymmetry, speech difficulty, light-headedness, numbness and headaches.  Hematological: Negative for adenopathy. Does not bruise/bleed easily.  Psychiatric/Behavioral: Negative for suicidal ideas, hallucinations, behavioral problems, confusion, sleep disturbance, self-injury, dysphoric mood, decreased concentration and agitation. The patient is not nervous/anxious.        Objective:   Physical Exam  Constitutional: He appears well-developed and well-nourished.  HENT:  Head: Normocephalic and atraumatic.  Right Ear: External ear normal.  Left Ear: External ear normal.  Nose: Nose normal.  Mouth/Throat: Oropharynx is clear and moist.  Eyes: Conjunctivae and EOM are normal. Pupils are equal, round, and reactive to light. No scleral icterus.  Neck: Normal range of motion. Neck supple. No JVD present. No thyromegaly present.  Cardiovascular: Regular rhythm, normal heart sounds  and intact distal pulses.  Exam reveals no gallop and no friction rub.   No murmur heard. Pulmonary/Chest: Effort normal and breath sounds normal. He exhibits no tenderness.  Abdominal: Soft. Bowel sounds are normal. He exhibits no distension and no mass. There is no tenderness.  Genitourinary: Penis normal. Guaiac negative stool.  Prostate +2 enlarged  Musculoskeletal: Normal range of motion. He exhibits edema. He exhibits no tenderness.  +1 ankle edema. Full peripheral pulses  Lymphadenopathy:    He has no cervical adenopathy.  Neurological: He is alert. He has normal reflexes. No cranial nerve deficit. Coordination normal.  Skin: Skin is warm and dry. No rash noted.  Psychiatric: He has a normal mood and affect. His behavior is normal.          Assessment & Plan:  Preventive health exam  Hypertension well controlled  Osteoarthritis. Followup orthopedics  BPH stable . Slightly more symptomatic CLL. Followup CBC with differential today   Laboratory and it will be reviewed that weight loss all encouraged. Will recheck 6 months

## 2013-01-07 NOTE — Patient Instructions (Signed)
Limit your sodium (Salt) intake  Please check your blood pressure on a regular basis.  If it is consistently greater than 150/90, please make an office appointment.  Return in 6 months for follow-up   

## 2013-01-18 DIAGNOSIS — H43399 Other vitreous opacities, unspecified eye: Secondary | ICD-10-CM | POA: Diagnosis not present

## 2013-01-18 DIAGNOSIS — H353190 Nonexudative age-related macular degeneration, unspecified eye, stage unspecified: Secondary | ICD-10-CM | POA: Diagnosis not present

## 2013-01-19 DIAGNOSIS — H353290 Exudative age-related macular degeneration, unspecified eye, stage unspecified: Secondary | ICD-10-CM | POA: Diagnosis not present

## 2013-01-19 DIAGNOSIS — H35059 Retinal neovascularization, unspecified, unspecified eye: Secondary | ICD-10-CM | POA: Diagnosis not present

## 2013-01-24 ENCOUNTER — Telehealth: Payer: Self-pay | Admitting: Internal Medicine

## 2013-01-24 NOTE — Telephone Encounter (Signed)
Patient Information:  Caller Name: Natalia Leatherwood  Phone: [phone]  Patient: Timothy Kim, Timothy Kim  Gender: Male  DOB: 1930-04-13  Age: 79 Years  PCP: Eleonore Chiquito [facility])  Office Follow Up:  Does the office need to follow up with this patient?: No  Instructions For The Office: N/A  RN Note:  Call has been handled by staff but needed additional documentation.  Symptoms  Reason For Call & Symptoms: Friend calling and states MD gave her lab results for this patient but was missing last page. Is wanitng Lupita Leash to call her and print page for her to pick up.  Reviewed Health History In EMR: N/A  Reviewed Medications In EMR: N/A  Reviewed Allergies In EMR: N/A  Reviewed Surgeries / Procedures: N/A  Date of Onset of Symptoms: 01/24/2013  Guideline(s) Used:  No Protocol Available - Information Only  Disposition Per Guideline:   Home Care  Reason For Disposition Reached:   Information only question and nurse able to answer  Advice Given:  N/A  Patient Will Follow Care Advice:  YES

## 2013-01-25 NOTE — Telephone Encounter (Signed)
Spoke to pt told him he received all lab results the page missing is just our orders no results. Pt verbalized understanding.

## 2013-02-24 DIAGNOSIS — H35059 Retinal neovascularization, unspecified, unspecified eye: Secondary | ICD-10-CM | POA: Diagnosis not present

## 2013-02-24 DIAGNOSIS — H35359 Cystoid macular degeneration, unspecified eye: Secondary | ICD-10-CM | POA: Diagnosis not present

## 2013-02-24 DIAGNOSIS — H353290 Exudative age-related macular degeneration, unspecified eye, stage unspecified: Secondary | ICD-10-CM | POA: Diagnosis not present

## 2013-03-30 DIAGNOSIS — H35059 Retinal neovascularization, unspecified, unspecified eye: Secondary | ICD-10-CM | POA: Diagnosis not present

## 2013-03-30 DIAGNOSIS — H353290 Exudative age-related macular degeneration, unspecified eye, stage unspecified: Secondary | ICD-10-CM | POA: Diagnosis not present

## 2013-04-14 DIAGNOSIS — H4011X0 Primary open-angle glaucoma, stage unspecified: Secondary | ICD-10-CM | POA: Diagnosis not present

## 2013-04-14 DIAGNOSIS — Z961 Presence of intraocular lens: Secondary | ICD-10-CM | POA: Diagnosis not present

## 2013-04-14 DIAGNOSIS — H02409 Unspecified ptosis of unspecified eyelid: Secondary | ICD-10-CM | POA: Diagnosis not present

## 2013-04-14 DIAGNOSIS — H40019 Open angle with borderline findings, low risk, unspecified eye: Secondary | ICD-10-CM | POA: Diagnosis not present

## 2013-04-14 DIAGNOSIS — H353190 Nonexudative age-related macular degeneration, unspecified eye, stage unspecified: Secondary | ICD-10-CM | POA: Diagnosis not present

## 2013-04-15 ENCOUNTER — Ambulatory Visit (INDEPENDENT_AMBULATORY_CARE_PROVIDER_SITE_OTHER): Payer: Medicare Other

## 2013-04-15 ENCOUNTER — Ambulatory Visit: Payer: Medicare Other

## 2013-04-15 DIAGNOSIS — Z23 Encounter for immunization: Secondary | ICD-10-CM

## 2013-04-19 ENCOUNTER — Ambulatory Visit: Payer: Medicare Other

## 2013-05-11 DIAGNOSIS — H35059 Retinal neovascularization, unspecified, unspecified eye: Secondary | ICD-10-CM | POA: Diagnosis not present

## 2013-05-11 DIAGNOSIS — H353290 Exudative age-related macular degeneration, unspecified eye, stage unspecified: Secondary | ICD-10-CM | POA: Diagnosis not present

## 2013-05-26 DIAGNOSIS — H02409 Unspecified ptosis of unspecified eyelid: Secondary | ICD-10-CM | POA: Diagnosis not present

## 2013-05-26 DIAGNOSIS — Z961 Presence of intraocular lens: Secondary | ICD-10-CM | POA: Diagnosis not present

## 2013-05-26 DIAGNOSIS — H4011X0 Primary open-angle glaucoma, stage unspecified: Secondary | ICD-10-CM | POA: Diagnosis not present

## 2013-05-26 DIAGNOSIS — H353190 Nonexudative age-related macular degeneration, unspecified eye, stage unspecified: Secondary | ICD-10-CM | POA: Diagnosis not present

## 2013-05-26 DIAGNOSIS — H40019 Open angle with borderline findings, low risk, unspecified eye: Secondary | ICD-10-CM | POA: Diagnosis not present

## 2013-05-31 ENCOUNTER — Telehealth: Payer: Self-pay | Admitting: Internal Medicine

## 2013-05-31 MED ORDER — ZOLPIDEM TARTRATE 5 MG PO TABS: 5.0000 mg | ORAL_TABLET | Freq: Every evening | ORAL | Status: DC | PRN

## 2013-05-31 MED ORDER — ZOLPIDEM TARTRATE 10 MG PO TABS: 10.0000 mg | ORAL_TABLET | Freq: Every evening | ORAL | Status: DC | PRN

## 2013-05-31 NOTE — Telephone Encounter (Signed)
Patient is requesting a call back from [place]. He states that Dr. Kirtland Bouchard told him to call her if his problem prolonged. The patient is having sleeping problems. He is not sleeping well at all..he wakes up in the early a.m., and can't seem to go back to sleep. His telephone number listed is current and he wants a call back as soon as possible. [phone]  cj

## 2013-05-31 NOTE — Telephone Encounter (Signed)
Please advise pt last seen July.

## 2013-05-31 NOTE — Telephone Encounter (Signed)
OK to try generic Ambien  5 mg #30 one at bedtime as needed for sleep

## 2013-05-31 NOTE — Telephone Encounter (Signed)
Spoke to pt told him Rx for Ambien 5 mg was called into pharmacy for him. Pt verbalized understanding.

## 2013-06-20 DIAGNOSIS — H353290 Exudative age-related macular degeneration, unspecified eye, stage unspecified: Secondary | ICD-10-CM | POA: Diagnosis not present

## 2013-06-20 DIAGNOSIS — H35369 Drusen (degenerative) of macula, unspecified eye: Secondary | ICD-10-CM | POA: Diagnosis not present

## 2013-06-20 DIAGNOSIS — H35359 Cystoid macular degeneration, unspecified eye: Secondary | ICD-10-CM | POA: Diagnosis not present

## 2013-07-13 ENCOUNTER — Ambulatory Visit: Payer: Medicare Other | Admitting: Internal Medicine

## 2013-07-19 ENCOUNTER — Other Ambulatory Visit (HOSPITAL_BASED_OUTPATIENT_CLINIC_OR_DEPARTMENT_OTHER): Payer: Medicare Other

## 2013-07-19 ENCOUNTER — Telehealth: Payer: Self-pay | Admitting: Oncology

## 2013-07-19 ENCOUNTER — Ambulatory Visit (HOSPITAL_BASED_OUTPATIENT_CLINIC_OR_DEPARTMENT_OTHER): Payer: Medicare Other | Admitting: Oncology

## 2013-07-19 VITALS — BP 158/77 | HR 60 | Temp 97.0°F | Resp 18 | Ht 66.0 in | Wt 177.1 lb

## 2013-07-19 DIAGNOSIS — C9110 Chronic lymphocytic leukemia of B-cell type not having achieved remission: Secondary | ICD-10-CM

## 2013-07-19 DIAGNOSIS — D72829 Elevated white blood cell count, unspecified: Secondary | ICD-10-CM

## 2013-07-19 LAB — COMPREHENSIVE METABOLIC PANEL (CC13)
ALK PHOS: 73 U/L (ref 40–150)
ALT: 27 U/L (ref 0–55)
AST: 42 U/L — ABNORMAL HIGH (ref 5–34)
Albumin: 3.7 g/dL (ref 3.5–5.0)
Anion Gap: 9 mEq/L (ref 3–11)
BUN: 14 mg/dL (ref 7.0–26.0)
CO2: 31 meq/L — AB (ref 22–29)
Calcium: 9.4 mg/dL (ref 8.4–10.4)
Chloride: 102 mEq/L (ref 98–109)
Creatinine: 0.9 mg/dL (ref 0.7–1.3)
Glucose: 127 mg/dl (ref 70–140)
Potassium: 4.3 mEq/L (ref 3.5–5.1)
Sodium: 142 mEq/L (ref 136–145)
TOTAL PROTEIN: 6.9 g/dL (ref 6.4–8.3)
Total Bilirubin: 0.92 mg/dL (ref 0.20–1.20)

## 2013-07-19 LAB — CBC WITH DIFFERENTIAL/PLATELET
BASO%: 0.9 % (ref 0.0–2.0)
BASOS ABS: 0.1 10*3/uL (ref 0.0–0.1)
EOS ABS: 0.4 10*3/uL (ref 0.0–0.5)
EOS%: 3.4 % (ref 0.0–7.0)
HCT: 42.4 % (ref 38.4–49.9)
HEMOGLOBIN: 14.2 g/dL (ref 13.0–17.1)
LYMPH%: 57.5 % — ABNORMAL HIGH (ref 14.0–49.0)
MCH: 35.3 pg — ABNORMAL HIGH (ref 27.2–33.4)
MCHC: 33.5 g/dL (ref 32.0–36.0)
MCV: 105.5 fL — AB (ref 79.3–98.0)
MONO#: 0.7 10*3/uL (ref 0.1–0.9)
MONO%: 6.3 % (ref 0.0–14.0)
NEUT%: 31.9 % — ABNORMAL LOW (ref 39.0–75.0)
NEUTROS ABS: 3.7 10*3/uL (ref 1.5–6.5)
Platelets: 184 10*3/uL (ref 140–400)
RBC: 4.02 10*6/uL — ABNORMAL LOW (ref 4.20–5.82)
RDW: 13.4 % (ref 11.0–14.6)
WBC: 11.6 10*3/uL — ABNORMAL HIGH (ref 4.0–10.3)
lymph#: 6.7 10*3/uL — ABNORMAL HIGH (ref 0.9–3.3)

## 2013-07-19 LAB — TECHNOLOGIST REVIEW

## 2013-07-19 LAB — LACTATE DEHYDROGENASE (CC13): LDH: 198 U/L (ref 125–245)

## 2013-07-19 NOTE — Telephone Encounter (Signed)
gv pt appt schedule  for jan 2016. per 07/19/13 pof f/u w/NG 1 yr. former [place] pt.

## 2013-07-20 NOTE — Progress Notes (Signed)
Hematology and Oncology Follow Up Visit  Timothy Kim [id number] 1929/07/17 80 y.o. 07/20/2013 3:30 PM   Principle Diagnosis: Encounter Diagnosis  Name Primary?  . CLL (chronic lymphocytic leukemia) Yes     Interim History:  Pleasant 80 year old man with stage I chronic lymphocytic leukemia who has so for the first time back in January 2014. He has had mild leukocytosis with absolute lymphocytosis for about 5 years. Total white count began to rise in February 2010 from previous value of 8000 in December 2008 up to 10,000 in February 2010. Peak white count recorded as 14,000 in January 2014 with value today 11,632% neutrophils 58% lymphocytes. Hemoglobin is 14 g. Platelets 184,000. At time of my exam in January last year, I was able to palpate single by axillary lymph nodes up to about 2 cm. No splenomegaly. No evidence for any paraneoplastic hemolytic anemia with normal LDH and reticulocyte count. Coombs negative. He has normal serum immunoglobulins and no monoclonal proteins on IFE. Beta-2 microglobulin borderline elevated at 2.42 units presents is 1-1.73) He has had no recurrent or severe infections.  He has had no interim medical problems. No hospitalizations. No infections. He did receive his flu vaccine this season. His only complaint today is some small areas of purpura on his forearms  Medications: reviewed  Allergies: No Known Allergies  Review of Systems: Hematology:  No bleeding ENT ROS: No sore throat Breast ROS:  Respiratory ROS: No cough or dyspnea Cardiovascular ROS:  No chest pain or palpitations Gastrointestinal ROS: No abdominal pain or change in bowel habit   Genito-Urinary ROS: No urinary tract symptoms Musculoskeletal ROS: No muscle bone or joint pain Neurological ROS: No headache or change in vision Dermatological ROS: Purpura as noted above Remaining ROS negative:   Physical Exam: Blood pressure 158/77, pulse 60, temperature 97 F (36.1 C), temperature  source Oral, resp. rate 18, height 5\' 6"  (1.676 m), weight 177 lb 1.6 oz (80.332 kg), SpO2 97.00%. Wt Readings from Last 3 Encounters:  07/19/13 177 lb 1.6 oz (80.332 kg)  01/07/13 174 lb (78.926 kg)  07/21/12 177 lb (80.287 kg)     General appearance: Elderly well-nourished Caucasian man HENNT: Pharynx no erythema, exudate, mass, or ulcer. No thyromegaly or thyroid nodules Lymph nodes: Approximate 2 cm lymph node felt in the right axilla. I did not feel any left axillary adenopathy today. No cervical supraclavicular or inguinal adenopathy. Breasts:  Lungs: Clear to auscultation, resonant to percussion throughout Heart: Regular rhythm, no murmur, no gallop, no rub, no click, no edema Abdomen: Soft, nontender, normal bowel sounds, no mass, no organomegaly Extremities: No edema, no calf tenderness Musculoskeletal: no joint deformities GU:  Vascular: Carotid pulses 2+, no bruits,  Neurologic: Alert, oriented, PERRLA,  , cranial nerves grossly normal, motor strength 5 over 5, reflexes 1+ symmetric, upper body coordination normal, gait normal, Skin: No rash or ecchymosis  Lab Results: CBC W/Diff    Component Value Date/Time   WBC 11.6* 07/19/2013 1136   WBC 14.7* 01/07/2013 0959   RBC 4.02* 07/19/2013 1136   RBC 4.11* 01/07/2013 0959   HGB 14.2 07/19/2013 1136   HGB 14.6 01/07/2013 0959   HCT 42.4 07/19/2013 1136   HCT 43.7 01/07/2013 0959   PLT 184 07/19/2013 1136   PLT 184.0 01/07/2013 0959   MCV 105.5* 07/19/2013 1136   MCV 106.3* 01/07/2013 0959   MCH 35.3* 07/19/2013 1136   MCHC 33.5 07/19/2013 1136   MCHC 33.5 01/07/2013 0959   RDW 13.4 07/19/2013 1136  RDW 13.6 01/07/2013 0959   LYMPHSABS 6.7* 07/19/2013 1136   LYMPHSABS 8.2* 01/07/2013 0959   MONOABS 0.7 07/19/2013 1136   MONOABS 0.8 01/07/2013 0959   EOSABS 0.4 07/19/2013 1136   EOSABS 0.4 01/07/2013 0959   BASOSABS 0.1 07/19/2013 1136   BASOSABS 0.1 01/07/2013 0959     Chemistry      Component Value Date/Time   NA 142 07/19/2013  1136   NA 143 01/07/2012 0936   K 4.3 07/19/2013 1136   K 4.1 01/07/2012 0936   CL 103 01/07/2012 0936   CO2 31* 07/19/2013 1136   CO2 29 01/07/2012 0936   BUN 14.0 07/19/2013 1136   BUN 16 01/07/2012 0936   CREATININE 0.9 07/19/2013 1136   CREATININE 1.0 01/07/2012 0936      Component Value Date/Time   CALCIUM 9.4 07/19/2013 1136   CALCIUM 9.3 01/07/2012 0936   ALKPHOS 73 07/19/2013 1136   ALKPHOS 59 01/07/2012 0936   AST 42* 07/19/2013 1136   AST 47* 01/07/2012 0936   ALT 27 07/19/2013 1136   ALT 28 01/07/2012 0936   BILITOT 0.92 07/19/2013 1136   BILITOT 1.0 01/07/2012 0936      Impression:  Timothy Kim STAGE I chronic lymphocytic leukemia Blood counts have been stable over time. No indication for treatment at this time. We will continue to monitor him on an annual basis with interim counts or his primary care physician's office.  I will transition his care to Dr. Alvy Bimler next year when he returns.    CC: Patient Care Team: Marletta Lor, MD as PCP - General   Annia Belt, MD 1/28/20153:30 PM

## 2013-07-21 ENCOUNTER — Ambulatory Visit: Payer: Medicare Other | Admitting: Internal Medicine

## 2013-07-27 ENCOUNTER — Encounter: Payer: Self-pay | Admitting: *Deleted

## 2013-07-28 ENCOUNTER — Encounter: Payer: Self-pay | Admitting: Internal Medicine

## 2013-07-28 ENCOUNTER — Ambulatory Visit (INDEPENDENT_AMBULATORY_CARE_PROVIDER_SITE_OTHER): Payer: Medicare Other | Admitting: Internal Medicine

## 2013-07-28 VITALS — BP 140/80 | HR 78 | Temp 97.8°F | Resp 20 | Ht 66.0 in | Wt 179.0 lb

## 2013-07-28 DIAGNOSIS — I10 Essential (primary) hypertension: Secondary | ICD-10-CM

## 2013-07-28 DIAGNOSIS — M545 Low back pain, unspecified: Secondary | ICD-10-CM | POA: Diagnosis not present

## 2013-07-28 DIAGNOSIS — E119 Type 2 diabetes mellitus without complications: Secondary | ICD-10-CM | POA: Diagnosis not present

## 2013-07-28 MED ORDER — ZOLPIDEM TARTRATE 5 MG PO TABS: 5.0000 mg | ORAL_TABLET | Freq: Every evening | ORAL | Status: DC | PRN

## 2013-07-28 MED ORDER — SILDENAFIL CITRATE 100 MG PO TABS: 100.0000 mg | ORAL_TABLET | ORAL | Status: DC | PRN

## 2013-07-28 MED ORDER — TADALAFIL 20 MG PO TABS: 20.0000 mg | ORAL_TABLET | Freq: Every day | ORAL | Status: DC | PRN

## 2013-07-28 NOTE — Progress Notes (Signed)
Pre-visit discussion using our clinic review tool. No additional management support is needed unless otherwise documented below in the visit note.  

## 2013-07-28 NOTE — Patient Instructions (Signed)
Limit your sodium (Salt) intake    It is important that you exercise regularly, at least 20 minutes 3 to 4 times per week.  If you develop chest pain or shortness of breath seek  medical attention.  You need to lose weight.  Consider a lower calorie diet and regular exercise.  Return in 6 months for follow-up   

## 2013-07-28 NOTE — Progress Notes (Signed)
Subjective:    Patient ID: Timothy Kim, male    DOB: 1929/09/19, 80 y.o.   MRN: [id number]  HPI  80 -year-old patient who is seen today for followup. He has treated hypertension history of stage I CLL and does quite well except for osteoarthritis and low back pain. He has seen hematology recently. No new concerns or complaints today.  Past Medical History  Diagnosis Date  . ALLERGIC RHINITIS 02/09/2008    Qualifier: Diagnosis of  By: Burnice Logan  MD, [place] HYPERTROPHY 03/03/2007    Qualifier: Diagnosis of  By: Rogue Bussing CMA, Maryann Alar    . DIABETES MELLITUS, TYPE II 03/03/2007    Qualifier: Diagnosis of  By: Rogue Bussing CMA, Maryann Alar    . GERD 03/03/2007    Qualifier: Diagnosis of  By: Rogue Bussing CMA, Maryann Alar    . HYPERTENSION 03/03/2007    Qualifier: Diagnosis of  By: Rogue Bussing CMA, Maryann Alar    . LOW BACK PAIN 06/09/2007    Qualifier: Diagnosis of  By: Burnice Logan  MD, Doretha Sou   . 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 OSTEOARTHRITIS 06/09/2007    Qualifier: Diagnosis of  By: Burnice Logan  MD, Doretha Sou Glaucoma     History   Social History  . Marital Status: Single    Spouse Name: N/A    Number of Children: N/A  . Years of Education: N/A   Occupational History  . Not on file.   Social History Main Topics  . Smoking status: Never Smoker   . Smokeless tobacco: Never Used  . Alcohol Use: Not on file  . Drug Use: Not on file  . Sexual Activity: Not on file   Other Topics Concern  . Not on file   Social History Narrative  . No narrative on file    Past Surgical History  Procedure Laterality Date  . Hernia repair      inguinal  . Rotator cuff repair    . Laminectomy    . Retinal detachment surgery    . Cataract extraction      Family History  Problem Relation Age of Onset  . Cancer Mother     lung ca  . Alcohol abuse Sister   . Hypertension Neg Hx     family hx  . Sudden death  Neg Hx     famiylhx    No Known Allergies  Current Outpatient Prescriptions on File Prior to Visit  Medication Sig Dispense Refill  . benazepril-hydrochlorthiazide (LOTENSIN HCT) 20-12.5 MG per tablet TAKE 1 TABLET BY MOUTH ONCE DAILY  90 tablet  3  . brimonidine-timolol (COMBIGAN) 0.2-0.5 % ophthalmic solution Place 1 drop into both eyes every 12 (twelve) hours.        Marland Kitchen DHEA 50 MG TABS Take 1 tablet by mouth daily.      . Lutein-Zeaxanthin 15-0.7 MG CAPS Take 1 tablet by mouth daily.      . metoprolol succinate (TOPROL-XL) 100 MG 24 hr tablet Take 1 tablet (100 mg total) by mouth daily. Take with or immediately following a meal.  90 tablet  3  . Omega-3 Fatty Acids (FISH OIL) 1000 MG CAPS Take by mouth daily.        Marland Kitchen triamcinolone cream (KENALOG) 0.1 % APPLY TO AFFECTED AREA TWICE DAILY  80 g  1   No current facility-administered medications on file prior to visit.  BP 140/80  Pulse 78  Temp(Src) 97.8 F (36.6 C) (Oral)  Resp 20  Ht 5\' 6"  (1.676 m)  Wt 179 lb (81.194 kg)  BMI 28.91 kg/m2  SpO2 98%     Review of Systems  Constitutional: Negative for fever, chills, appetite change and fatigue.  HENT: Negative for congestion, dental problem, ear pain, hearing loss, sore throat, tinnitus, trouble swallowing and voice change.   Eyes: Negative for pain, discharge and visual disturbance.  Respiratory: Negative for cough, chest tightness, wheezing and stridor.   Cardiovascular: Negative for chest pain, palpitations and leg swelling.  Gastrointestinal: Negative for nausea, vomiting, abdominal pain, diarrhea, constipation, blood in stool and abdominal distention.  Genitourinary: Negative for urgency, hematuria, flank pain, discharge, difficulty urinating and genital sores.  Musculoskeletal: Positive for back pain. Negative for arthralgias, gait problem, joint swelling, myalgias and neck stiffness.  Skin: Negative for rash.  Neurological: Negative for dizziness, syncope, speech  difficulty, weakness, numbness and headaches.  Hematological: Negative for adenopathy. Does not bruise/bleed easily.  Psychiatric/Behavioral: Negative for behavioral problems and dysphoric mood. The patient is not nervous/anxious.        Objective:   Physical Exam  Constitutional: He is oriented to person, place, and time. He appears well-developed.  HENT:  Head: Normocephalic.  Right Ear: External ear normal.  Left Ear: External ear normal.  Eyes: Conjunctivae and EOM are normal.  Neck: Normal range of motion.  Cardiovascular: Normal rate and normal heart sounds.   Pulmonary/Chest: Breath sounds normal.  Abdominal: Bowel sounds are normal.  Musculoskeletal: Normal range of motion. He exhibits no edema and no tenderness.  Trace ankle edema  Neurological: He is alert and oriented to person, place, and time.  Psychiatric: He has a normal mood and affect. His behavior is normal.          Assessment & Plan:   Hypertension well controlled. We'll continue present regimen Chronic low back pain. Stable CLL. Stable History of impaired glucose tolerance. We'll check hemoglobin A1c at the time of his physical next visit.

## 2013-07-29 ENCOUNTER — Telehealth: Payer: Self-pay

## 2013-07-29 ENCOUNTER — Telehealth: Payer: Self-pay | Admitting: Internal Medicine

## 2013-07-29 NOTE — Telephone Encounter (Signed)
Relevant patient education assigned to patient using Emmi. ° °

## 2013-08-10 DIAGNOSIS — H353290 Exudative age-related macular degeneration, unspecified eye, stage unspecified: Secondary | ICD-10-CM | POA: Diagnosis not present

## 2013-08-10 DIAGNOSIS — H35359 Cystoid macular degeneration, unspecified eye: Secondary | ICD-10-CM | POA: Diagnosis not present

## 2013-08-10 DIAGNOSIS — H35059 Retinal neovascularization, unspecified, unspecified eye: Secondary | ICD-10-CM | POA: Diagnosis not present

## 2013-08-20 ENCOUNTER — Encounter: Payer: Self-pay | Admitting: Oncology

## 2013-10-13 DIAGNOSIS — H353290 Exudative age-related macular degeneration, unspecified eye, stage unspecified: Secondary | ICD-10-CM | POA: Diagnosis not present

## 2013-10-13 DIAGNOSIS — H35359 Cystoid macular degeneration, unspecified eye: Secondary | ICD-10-CM | POA: Diagnosis not present

## 2013-10-13 DIAGNOSIS — H43819 Vitreous degeneration, unspecified eye: Secondary | ICD-10-CM | POA: Diagnosis not present

## 2013-10-14 ENCOUNTER — Telehealth: Payer: Self-pay | Admitting: Internal Medicine

## 2013-10-14 MED ORDER — ZOLPIDEM TARTRATE 5 MG PO TABS: 5.0000 mg | ORAL_TABLET | Freq: Every evening | ORAL | Status: DC | PRN

## 2013-10-14 NOTE — Telephone Encounter (Signed)
Katherine notified pt's Rx called into pharmacy.

## 2013-10-14 NOTE — Telephone Encounter (Signed)
Pt's partner called stating that [facility] called [facility] and stated that the refill for rx zolpidem (AMBIEN) 5 MG tablet could not be filled without prior approval from dr. Raliegh Ip. Pt is requesting that [facility] is [facility] and provided the approval so that the rx can be filled. [facility]

## 2013-11-28 ENCOUNTER — Other Ambulatory Visit: Payer: Self-pay | Admitting: Internal Medicine

## 2013-11-30 DIAGNOSIS — Z961 Presence of intraocular lens: Secondary | ICD-10-CM | POA: Diagnosis not present

## 2013-11-30 DIAGNOSIS — H43819 Vitreous degeneration, unspecified eye: Secondary | ICD-10-CM | POA: Diagnosis not present

## 2013-11-30 DIAGNOSIS — H353190 Nonexudative age-related macular degeneration, unspecified eye, stage unspecified: Secondary | ICD-10-CM | POA: Diagnosis not present

## 2013-11-30 DIAGNOSIS — H04129 Dry eye syndrome of unspecified lacrimal gland: Secondary | ICD-10-CM | POA: Diagnosis not present

## 2013-11-30 DIAGNOSIS — H4011X0 Primary open-angle glaucoma, stage unspecified: Secondary | ICD-10-CM | POA: Diagnosis not present

## 2013-11-30 DIAGNOSIS — H353290 Exudative age-related macular degeneration, unspecified eye, stage unspecified: Secondary | ICD-10-CM | POA: Diagnosis not present

## 2013-12-15 DIAGNOSIS — H43819 Vitreous degeneration, unspecified eye: Secondary | ICD-10-CM | POA: Diagnosis not present

## 2013-12-15 DIAGNOSIS — H43399 Other vitreous opacities, unspecified eye: Secondary | ICD-10-CM | POA: Diagnosis not present

## 2013-12-28 DIAGNOSIS — H35359 Cystoid macular degeneration, unspecified eye: Secondary | ICD-10-CM | POA: Diagnosis not present

## 2013-12-28 DIAGNOSIS — H43819 Vitreous degeneration, unspecified eye: Secondary | ICD-10-CM | POA: Diagnosis not present

## 2013-12-28 DIAGNOSIS — H35369 Drusen (degenerative) of macula, unspecified eye: Secondary | ICD-10-CM | POA: Diagnosis not present

## 2014-01-31 ENCOUNTER — Other Ambulatory Visit: Payer: Self-pay | Admitting: Internal Medicine

## 2014-02-01 ENCOUNTER — Encounter: Payer: Self-pay | Admitting: Internal Medicine

## 2014-02-01 ENCOUNTER — Ambulatory Visit (INDEPENDENT_AMBULATORY_CARE_PROVIDER_SITE_OTHER): Payer: Medicare Other | Admitting: Internal Medicine

## 2014-02-01 ENCOUNTER — Telehealth: Payer: Self-pay | Admitting: Internal Medicine

## 2014-02-01 VITALS — BP 134/80 | HR 60 | Temp 98.2°F | Resp 20 | Ht 65.75 in | Wt 168.0 lb

## 2014-02-01 DIAGNOSIS — M545 Low back pain, unspecified: Secondary | ICD-10-CM

## 2014-02-01 DIAGNOSIS — M1990 Unspecified osteoarthritis, unspecified site: Secondary | ICD-10-CM

## 2014-02-01 DIAGNOSIS — Z23 Encounter for immunization: Secondary | ICD-10-CM | POA: Diagnosis not present

## 2014-02-01 DIAGNOSIS — Z0000 Encounter for general adult medical examination without abnormal findings: Secondary | ICD-10-CM

## 2014-02-01 DIAGNOSIS — I10 Essential (primary) hypertension: Secondary | ICD-10-CM

## 2014-02-01 DIAGNOSIS — E119 Type 2 diabetes mellitus without complications: Secondary | ICD-10-CM

## 2014-02-01 DIAGNOSIS — J309 Allergic rhinitis, unspecified: Secondary | ICD-10-CM

## 2014-02-01 DIAGNOSIS — N400 Enlarged prostate without lower urinary tract symptoms: Secondary | ICD-10-CM

## 2014-02-01 DIAGNOSIS — C9110 Chronic lymphocytic leukemia of B-cell type not having achieved remission: Secondary | ICD-10-CM

## 2014-02-01 LAB — TSH: TSH: 2.38 u[IU]/mL (ref 0.35–4.50)

## 2014-02-01 LAB — HEMOGLOBIN A1C: Hgb A1c MFr Bld: 5.5 % (ref 4.6–6.5)

## 2014-02-01 MED ORDER — AMOXICILLIN-POT CLAVULANATE 875-125 MG PO TABS: 1.0000 | ORAL_TABLET | Freq: Two times a day (BID) | ORAL | Status: DC

## 2014-02-01 MED ORDER — ZOLPIDEM TARTRATE 5 MG PO TABS: 5.0000 mg | ORAL_TABLET | Freq: Every evening | ORAL | Status: DC | PRN

## 2014-02-01 NOTE — Telephone Encounter (Signed)
Relevant patient education assigned to patient using Emmi. ° °

## 2014-02-01 NOTE — Patient Instructions (Signed)
Limit your sodium (Salt) intake    It is important that you exercise regularly, at least 20 minutes 3 to 4 times per week.  If you develop chest pain or shortness of breath seek  medical attention.  Please check your blood pressure on a regular basis.  If it is consistently greater than 150/90, please make an office appointment.  Return in 6 months for follow-up  Dental followup as discussed

## 2014-02-01 NOTE — Progress Notes (Signed)
Patient ID: Timothy Kim, male   DOB: 17-Dec-1929, 80 y.o.   MRN: [id number]  Subjective:    Patient ID: Timothy Kim, male    DOB: 03/25/1930, 80 y.o.   MRN: [id number]  Hypertension Pertinent negatives include no chest pain, headaches, neck pain, palpitations or shortness of breath.    History of Present Illness:   80 year-old patient who is seen today for a comprehensive evaluation.  He has a history of treated hypertension. He has additionally, gastroesophageal reflux disease, and a history of diet-controlled diabetes. He has osteoarthritis and chronic low back pain. He has quite well except for his chronic low back pain.  He has had worsening low back pain and has scheduled orthopedic followup. He continues to be followed annually at [facility] for eye care and also locally by Dr. Katy Fitch. He is now  followed by oncology for stage I CLL.  He has been evaluated by orthopedics and has had a epidural and also cortisone injection to his knee earlier last year. Complaints today include some discomfort involving his right lower jaw area.  This initially occurred at an area where he had lost a tooth.  Due to the discomfort.  He has a difficult time completely open his mouth.  He does have a dental consult scheduled next month.  He also describes considerable sinus drainage at times discolored and bloody.  Here for  Medicare AWV:   1. Risk factors based on Past M, S, F history: cardiovascular risk factors include a history of hypertension and diet controlled diabetes  2. Physical Activities: no rigorous exercise activities, but is active with yard work  3. Depression/mood: no history of depression  4. Hearing: no hearing deficits  5. ADL's: completely active in all aspects of daily living  6. Fall Risk: low  7. Home Safety: no problems identified  8. Height, weight, &visual acuity:height and weight are stable. Has been recently diagnosed with glaucoma and is followed closely by ophthalmology; he  is also followed for macular degeneration 9. Counseling: modest weight loss encouraged. Screening colonoscopy. Discussed  10. Labs ordered based on risk factors: complete laboratory profile, including lipid panel, PSA will be reviewed  11. Referral Coordination- will follow-up with his orthopedic physician due to his low back pain  12. Care Plan- heart healthy diet modest weight loss and exercise regimen discussed. Screening colonoscopy. Also discussed  13. Cognitive Assessment- alert and oriented, with normal affect   Preventive Screening-Counseling & Management  Alcohol-Tobacco  Smoking Status: never   Allergies (verified):  No Known Drug Allergies   Past History:  Past Medical History:  Diabetes mellitus, type II  GERD  Hypertension  Benign prostatic hypertrophy  Low back pain  Osteoarthritis  history of EtOH  Allergic rhinitis  Glaucoma R Eye ([place] [facility])  Macular degeneration  Past Surgical History:  Inguinal herniorrhaphy x 2  Rotator cuff repair 2002  Flexible Sigmoidoscopy 20 yrs ago  status post laminectomy March 2005  history retinal detachment  Cataract extraction   Family History:   Family History of Alcoholism/Addiction  Family History of Colon CA 1st degree relative <60  Family History Hypertension  Family History of Sudden Death  Father died age 43-unclear causes  Mother died age 30 Lung Ca  One brother age 32-DJD  One Sister died age 38- ETOH   Social History:    Retired  Single  Regular exercise-no  Never Smoked  MOD to heavy 2-3   Past Medical History  Diagnosis Date  .  ALLERGIC RHINITIS 02/09/2008    Qualifier: Diagnosis of  By: Burnice Logan  MD, [place] HYPERTROPHY 03/03/2007    Qualifier: Diagnosis of  By: Rogue Bussing CMA, Maryann Alar    . DIABETES MELLITUS, TYPE II 03/03/2007    Qualifier: Diagnosis of  By: Rogue Bussing CMA, Maryann Alar    . GERD 03/03/2007    Qualifier: Diagnosis of  By: Rogue Bussing CMA, Maryann Alar     . HYPERTENSION 03/03/2007    Qualifier: Diagnosis of  By: Rogue Bussing CMA, Maryann Alar    . LOW BACK PAIN 06/09/2007    Qualifier: Diagnosis of  By: Burnice Logan  MD, Doretha Sou   . 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 OSTEOARTHRITIS 06/09/2007    Qualifier: Diagnosis of  By: Burnice Logan  MD, Doretha Sou Glaucoma     History   Social History  . Marital Status: Single    Spouse Name: N/A    Number of Children: N/A  . Years of Education: N/A   Occupational History  . Not on file.   Social History Main Topics  . Smoking status: Never Smoker   . Smokeless tobacco: Never Used  . Alcohol Use: Not on file  . Drug Use: Not on file  . Sexual Activity: Not on file   Other Topics Concern  . Not on file   Social History Narrative  . No narrative on file    Past Surgical History  Procedure Laterality Date  . Hernia repair      inguinal  . Rotator cuff repair    . Laminectomy    . Retinal detachment surgery    . Cataract extraction      Family History  Problem Relation Age of Onset  . Cancer Mother     lung ca  . Alcohol abuse Sister   . Hypertension Neg Hx     family hx  . Sudden death Neg Hx     famiylhx    No Known Allergies  Current Outpatient Prescriptions on File Prior to Visit  Medication Sig Dispense Refill  . benazepril-hydrochlorthiazide (LOTENSIN HCT) 20-12.5 MG per tablet TAKE 1 TABLET EVERY DAY  90 tablet  3  . brimonidine-timolol (COMBIGAN) 0.2-0.5 % ophthalmic solution Place 1 drop into both eyes every 12 (twelve) hours.        Marland Kitchen DHEA 50 MG TABS Take 1 tablet by mouth daily.      . Lutein-Zeaxanthin 15-0.7 MG CAPS Take 1 tablet by mouth daily.      . metoprolol succinate (TOPROL-XL) 100 MG 24 hr tablet TAKE 1 TABLET IMMEDIATELY FOLLOWING A MEAL  90 tablet  3  . Omega-3 Fatty Acids (FISH OIL) 1000 MG CAPS Take by mouth daily.        . sildenafil (VIAGRA) 100 MG tablet Take 1 tablet (100 mg total) by  mouth as needed for erectile dysfunction.  3 tablet  6  . tadalafil (CIALIS) 20 MG tablet Take 1 tablet (20 mg total) by mouth daily as needed for erectile dysfunction.  10 tablet  3  . triamcinolone cream (KENALOG) 0.1 % APPLY TO AFFECTED AREA TWICE DAILY  80 g  1   No current facility-administered medications on file prior to visit.    BP 134/80  Pulse 60  Temp(Src) 98.2 F (36.8 C) (Oral)  Resp 20  Ht 5' 5.75" (1.67 m)  Wt 168 lb (76.204 kg)  BMI 27.32 kg/m2  SpO2 98%      Review of Systems  Constitutional: Positive for activity change, appetite change and fatigue. Negative for fever and chills.  HENT: Positive for rhinorrhea and sinus pressure. Negative for congestion, dental problem, ear pain, hearing loss, mouth sores, sneezing, tinnitus, trouble swallowing and voice change.   Eyes: Negative for photophobia, pain, redness and visual disturbance.  Respiratory: Positive for cough. Negative for apnea, choking, chest tightness, shortness of breath and wheezing.   Cardiovascular: Negative for chest pain, palpitations and leg swelling.  Gastrointestinal: Negative for nausea, vomiting, abdominal pain, diarrhea, constipation, blood in stool, abdominal distention, anal bleeding and rectal pain.  Genitourinary: Negative for dysuria, urgency, frequency, hematuria, flank pain, decreased urine volume, discharge, penile swelling, scrotal swelling, difficulty urinating, genital sores and testicular pain.  Musculoskeletal: Positive for back pain. Negative for arthralgias, gait problem, joint swelling, myalgias, neck pain and neck stiffness.  Skin: Negative for color change, rash and wound.  Neurological: Positive for weakness. Negative for dizziness, tremors, seizures, syncope, facial asymmetry, speech difficulty, light-headedness, numbness and headaches.  Hematological: Negative for adenopathy. Does not bruise/bleed easily.  Psychiatric/Behavioral: Negative for suicidal ideas, hallucinations,  behavioral problems, confusion, sleep disturbance, self-injury, dysphoric mood, decreased concentration and agitation. The patient is not nervous/anxious.        Objective:   Physical Exam  Constitutional: He appears well-developed and well-nourished.  HENT:  Head: Normocephalic and atraumatic.  Right Ear: External ear normal.  Left Ear: External ear normal.  Nose: Nose normal.  Mouth/Throat: Oropharynx is clear and moist.  Eyes: Conjunctivae and EOM are normal. Pupils are equal, round, and reactive to light. No scleral icterus.  Neck: Normal range of motion. Neck supple. No JVD present. No thyromegaly present.  Cardiovascular: Regular rhythm, normal heart sounds and intact distal pulses.  Exam reveals no gallop and no friction rub.   No murmur heard. Pulmonary/Chest: Effort normal and breath sounds normal. He exhibits no tenderness.  Abdominal: Soft. Bowel sounds are normal. He exhibits no distension and no mass. There is no tenderness.  Genitourinary: Penis normal. Guaiac negative stool.  Prostate +2 enlarged  Musculoskeletal: Normal range of motion. He exhibits edema. He exhibits no tenderness.  +1 ankle edema. Full peripheral pulses  Lymphadenopathy:    He has no cervical adenopathy.  Neurological: He is alert. He has normal reflexes. No cranial nerve deficit. Coordination normal.  Skin: Skin is warm and dry. No rash noted.  Psychiatric: He has a normal mood and affect. His behavior is normal.          Assessment & Plan:  Preventive health exam  Hypertension well controlled  Osteoarthritis. Followup orthopedics  BPH stable . Slightly more symptomatic CLL. Followup CBC with differential today  Possible dental infection/sinusitis.  We'll treat with 10 days of Augmentin.  We'll follow up with his dentist   Laboratory and it will be reviewed that weight loss all encouraged. Will recheck 6 months

## 2014-02-01 NOTE — Progress Notes (Signed)
Pre visit review using our clinic review tool, if applicable. No additional management support is needed unless otherwise documented below in the visit note. 

## 2014-02-02 ENCOUNTER — Encounter: Payer: Medicare Other | Admitting: Internal Medicine

## 2014-04-05 ENCOUNTER — Ambulatory Visit: Payer: Medicare Other

## 2014-04-07 ENCOUNTER — Ambulatory Visit: Payer: Medicare Other

## 2014-04-11 ENCOUNTER — Other Ambulatory Visit: Payer: Self-pay | Admitting: Internal Medicine

## 2014-04-17 ENCOUNTER — Ambulatory Visit: Payer: Medicare Other

## 2014-04-20 ENCOUNTER — Ambulatory Visit (INDEPENDENT_AMBULATORY_CARE_PROVIDER_SITE_OTHER): Payer: Medicare Other

## 2014-04-20 DIAGNOSIS — Z23 Encounter for immunization: Secondary | ICD-10-CM

## 2014-05-04 DIAGNOSIS — H3561 Retinal hemorrhage, right eye: Secondary | ICD-10-CM | POA: Diagnosis not present

## 2014-05-04 DIAGNOSIS — H43811 Vitreous degeneration, right eye: Secondary | ICD-10-CM | POA: Diagnosis not present

## 2014-05-04 DIAGNOSIS — H43812 Vitreous degeneration, left eye: Secondary | ICD-10-CM | POA: Diagnosis not present

## 2014-05-23 ENCOUNTER — Other Ambulatory Visit: Payer: Self-pay | Admitting: Internal Medicine

## 2014-05-23 DIAGNOSIS — R6884 Jaw pain: Secondary | ICD-10-CM

## 2014-05-24 ENCOUNTER — Other Ambulatory Visit: Payer: Self-pay | Admitting: Otolaryngology

## 2014-05-24 DIAGNOSIS — K1379 Other lesions of oral mucosa: Secondary | ICD-10-CM | POA: Diagnosis not present

## 2014-05-24 DIAGNOSIS — R070 Pain in throat: Secondary | ICD-10-CM | POA: Diagnosis not present

## 2014-05-24 DIAGNOSIS — C099 Malignant neoplasm of tonsil, unspecified: Secondary | ICD-10-CM | POA: Diagnosis not present

## 2014-05-24 DIAGNOSIS — R220 Localized swelling, mass and lump, head: Secondary | ICD-10-CM | POA: Diagnosis not present

## 2014-05-24 DIAGNOSIS — C9110 Chronic lymphocytic leukemia of B-cell type not having achieved remission: Secondary | ICD-10-CM | POA: Diagnosis not present

## 2014-05-24 HISTORY — PX: OTHER SURGICAL HISTORY: SHX169

## 2014-05-29 ENCOUNTER — Telehealth: Payer: Self-pay | Admitting: Hematology and Oncology

## 2014-05-29 ENCOUNTER — Other Ambulatory Visit: Payer: Self-pay | Admitting: *Deleted

## 2014-05-29 NOTE — Telephone Encounter (Signed)
s.w pt and advised on Dec appt......pt ok and aware °

## 2014-05-30 ENCOUNTER — Encounter: Payer: Self-pay | Admitting: Hematology and Oncology

## 2014-05-30 ENCOUNTER — Telehealth: Payer: Self-pay | Admitting: *Deleted

## 2014-05-30 ENCOUNTER — Other Ambulatory Visit (HOSPITAL_COMMUNITY): Payer: Self-pay | Admitting: Otolaryngology

## 2014-05-30 DIAGNOSIS — C148 Malignant neoplasm of overlapping sites of lip, oral cavity and pharynx: Secondary | ICD-10-CM | POA: Insufficient documentation

## 2014-05-30 DIAGNOSIS — C099 Malignant neoplasm of tonsil, unspecified: Secondary | ICD-10-CM

## 2014-05-30 NOTE — Telephone Encounter (Signed)
Called pt to introduce myself as the oncology nurse navigator that works with Dr. Alvy Bimler, briefly explained my role.  I indicated that I would be joining him during his appt tomorrow morning at which time I would tell him more about my role as a member of the Care Team when we meet.  He confirmed his understanding of [place] location and an appt time of 0900.  We discussed the arrival and registration process.  He verbalized understanding and expressed appreciation for my call.  Gayleen Orem, RN, BSN, [place] at [place] [phone]

## 2014-05-31 ENCOUNTER — Other Ambulatory Visit: Payer: Self-pay | Admitting: Otolaryngology

## 2014-05-31 ENCOUNTER — Encounter: Payer: Self-pay | Admitting: *Deleted

## 2014-05-31 ENCOUNTER — Ambulatory Visit (HOSPITAL_BASED_OUTPATIENT_CLINIC_OR_DEPARTMENT_OTHER): Payer: Medicare Other | Admitting: Hematology and Oncology

## 2014-05-31 ENCOUNTER — Encounter: Payer: Self-pay | Admitting: Hematology and Oncology

## 2014-05-31 VITALS — BP 136/73 | HR 63 | Temp 98.4°F | Resp 18 | Ht 65.75 in | Wt 164.4 lb

## 2014-05-31 DIAGNOSIS — K1379 Other lesions of oral mucosa: Secondary | ICD-10-CM

## 2014-05-31 DIAGNOSIS — C099 Malignant neoplasm of tonsil, unspecified: Secondary | ICD-10-CM | POA: Diagnosis not present

## 2014-05-31 DIAGNOSIS — C9110 Chronic lymphocytic leukemia of B-cell type not having achieved remission: Secondary | ICD-10-CM

## 2014-05-31 DIAGNOSIS — R634 Abnormal weight loss: Secondary | ICD-10-CM | POA: Insufficient documentation

## 2014-05-31 DIAGNOSIS — R042 Hemoptysis: Secondary | ICD-10-CM

## 2014-05-31 NOTE — Assessment & Plan Note (Signed)
He is currently on observation. The CLL should not impact his treatment. However, if his imaging study is abnormal, it could be due to CLL and he may still need a biopsy.

## 2014-05-31 NOTE — Assessment & Plan Note (Signed)
This is related to cancer. Recommend close observation.

## 2014-05-31 NOTE — Progress Notes (Signed)
[facility] NOTE  Patient Care Team: Marletta Lor, MD as PCP - General Jodi Marble, MD as Consulting Physician (Otolaryngology) Heath Lark, MD as Consulting Physician (Hematology and Oncology) Brooks Sailors, RN as Oncology Nurse Dobson, RD as Dietitian (Nutrition) Eppie Gibson, MD as Attending Physician (Radiation Oncology)  CHIEF COMPLAINTS/PURPOSE OF CONSULTATION:   background history of CLL, newly diagnosed tonsil cancer  HISTORY OF PRESENTING ILLNESS:  Timothy Kim 80 y.o. male is here because of newly diagnosed newly diagnosed tonsil cancer According to the patient, the first initial presentation was due to difficulties with opening his mouth and swelling around his neck. He was prescribed antibiotics around August with no improvement. He was referred to see a dentist and was noted to have no dental issues. He was subsequently referred to see an oral surgeon. He complained of intermittent shooting pain sensation around the right ear and about 9 pound weight loss with associated ear ache.  Subsequently, in September through November, he started to have recurrent hemoptysis. His last episode was in 05/15/2014. He was subsequently referred to see ENT surgeon and his history was summarized as follows:   Tonsil cancer   05/24/2014 Surgery Flexible fiberoptic laryngoscope be confirmed asymmetric swelling with superficial ulceration at the right tonsil. 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     he denies any hearing deficit, difficulties with chewing food, swallowing difficulties, painful swallowing, changes in the quality of his voice.  He used to see Dr. Beryle Beams and is supposed to see me next month for further follow-up on this.  His CLL diagnosis was picked up in 2014 after presentation with mild leukocytosis with absolute  lymphocytosis for about 5 years. Total white count began to rise in February 2010 from previous value of 8000 in December 2008 up to 10,000 in February 2010. Peak white count recorded as 14,000 in January 2014  Without associated anemia or thrombocytopenia.  In January 2014, he had axillary lymph nodes  Palpable up to about 2 cm. No splenomegaly. No evidence for any paraneoplastic hemolytic anemia with normal LDH and reticulocyte count. Coombs negative. He has normal serum immunoglobulins and no monoclonal proteins on IFE. He was being observed.  MEDICAL HISTORY:  Past Medical History  Diagnosis Date  . ALLERGIC RHINITIS 02/09/2008    Qualifier: Diagnosis of  By: Burnice Logan  MD, [place] HYPERTROPHY 03/03/2007    Qualifier: Diagnosis of  By: Rogue Bussing CMA, Maryann Alar    . DIABETES MELLITUS, TYPE II 03/03/2007    Qualifier: Diagnosis of  By: Rogue Bussing CMA, Maryann Alar    . GERD 03/03/2007    Qualifier: Diagnosis of  By: Rogue Bussing CMA, Maryann Alar    . HYPERTENSION 03/03/2007    Qualifier: Diagnosis of  By: Rogue Bussing CMA, Maryann Alar    . LOW BACK PAIN 06/09/2007    Qualifier: Diagnosis of  By: Burnice Logan  MD, Doretha Sou   . 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 OSTEOARTHRITIS 06/09/2007    Qualifier: Diagnosis of  By: Burnice Logan  MD, Doretha Sou Glaucoma     SURGICAL HISTORY: Past Surgical History  Procedure Laterality Date  . Hernia repair      inguinal  . Rotator cuff repair    . Laminectomy    . Retinal detachment surgery    .  Cataract extraction      SOCIAL HISTORY: History   Social History  . Marital Status: Single    Spouse Name: N/A    Number of Children: N/A  . Years of Education: N/A   Occupational History  . Not on file.   Social History Main Topics  . Smoking status: Never Smoker   . Smokeless tobacco: Never Used  . Alcohol Use: 1.8 - 2.4 oz/week    3-4 Shots of liquor per week     Comment: Gin  and vodka daily  . Drug Use: No  . Sexual Activity: Not on file   Other Topics Concern  . Not on file   Social History Narrative    FAMILY HISTORY: Family History  Problem Relation Age of Onset  . Cancer Mother     lung ca  . Alcohol abuse Sister   . Hypertension Neg Hx     family hx  . Sudden death Neg Hx     famiylhx    ALLERGIES:  has No Known Allergies.  MEDICATIONS:  Current Outpatient Prescriptions  Medication Sig Dispense Refill  . benazepril-hydrochlorthiazide (LOTENSIN HCT) 20-12.5 MG per tablet TAKE 1 TABLET EVERY DAY 90 tablet 3  . brimonidine-timolol (COMBIGAN) 0.2-0.5 % ophthalmic solution Place 1 drop into both eyes every 12 (twelve) hours.      Marland Kitchen DHEA 50 MG TABS Take 1 tablet by mouth daily.    . Lutein-Zeaxanthin 15-0.7 MG CAPS Take 1 tablet by mouth daily.    . metoprolol succinate (TOPROL-XL) 100 MG 24 hr tablet TAKE 1 TABLET IMMEDIATELY FOLLOWING A MEAL 90 tablet 3  . Omega-3 Fatty Acids (FISH OIL) 1000 MG CAPS Take by mouth daily.      . sildenafil (VIAGRA) 100 MG tablet Take 1 tablet (100 mg total) by mouth as needed for erectile dysfunction. 3 tablet 6  . tadalafil (CIALIS) 20 MG tablet Take 1 tablet (20 mg total) by mouth daily as needed for erectile dysfunction. 10 tablet 3  . triamcinolone cream (KENALOG) 0.1 % APPLY TO AFFECTED AREA TWICE DAILY 80 g 1  . zolpidem (AMBIEN) 5 MG tablet Take 1 tablet (5 mg total) by mouth at bedtime as needed for sleep. 30 tablet 5   No current facility-administered medications for this visit.    REVIEW OF SYSTEMS:   Constitutional: Denies fevers, chills or abnormal night sweats Eyes: Denies blurriness of vision, double vision or watery eyes Respiratory: Denies cough, dyspnea or wheezes Cardiovascular: Denies palpitation, chest discomfort or lower extremity swelling Gastrointestinal:  Denies nausea, heartburn or change in bowel habits Skin: Denies abnormal skin rashes Neurological:Denies numbness, tingling or new  weaknesses Behavioral/Psych: Mood is stable, no new changes  All other systems were reviewed with the patient and are negative.  PHYSICAL EXAMINATION: ECOG PERFORMANCE STATUS: 1 - Symptomatic but completely ambulatory  Filed Vitals:   05/31/14 0912  BP: 136/73  Pulse: 63  Temp: 98.4 F (36.9 C)  Resp: 18   Filed Weights   05/31/14 0912  Weight: 164 lb 6.4 oz (74.571 kg)    GENERAL:alert, no distress and comfortable SKIN: skin color, texture, turgor are normal, no rashes or significant lesions EYES: normal, conjunctiva are pink and non-injected, sclera clear OROPHARYNX:no exudate, no erythema and lips, buccal mucosa, and tongue normal  NECK: supple, thyroid normal size, non-tender, without nodularity LYMPH:   Palpable lymphadenopathy in both sides of the neck and axilla. LUNGS: clear to auscultation and percussion with normal breathing effort HEART: regular rate &  rhythm and no murmurs and no lower extremity edema ABDOMEN:abdomen soft, non-tender and normal bowel sounds Musculoskeletal:no cyanosis of digits and no clubbing  PSYCH: alert & oriented x 3 with fluent speech NEURO: no focal motor/sensory deficits  LABORATORY DATA:  I have reviewed the data as listed Lab Results  Component Value Date   WBC 11.6* 07/19/2013   HGB 14.2 07/19/2013   HCT 42.4 07/19/2013   MCV 105.5* 07/19/2013   PLT 184 07/19/2013   Lab Results  Component Value Date   NA 142 07/19/2013   K 4.3 07/19/2013   CL 103 01/07/2012   CO2 31* 07/19/2013    ASSESSMENT:  Newly diagnosed squamous cell carcinoma of the Head & Neck, HPV N/A  PLAN:  Tonsil cancer  His staging scans are pending. We will review his case at the next ENT tumor board next week. We discussed the importance of multidisciplinary approach.  in the meantime, we will continue close follow-up.  CLL (chronic lymphocytic leukemia)  He is currently on observation. The CLL should not impact his treatment. However, if his imaging  study is abnormal, it could be due to CLL and he may still need a biopsy.  Abnormal weight loss  This is related to his malignancy. We will get dietitian to see him next week at the ENT multidisciplinary clinic.  Hemoptysis  This is related to cancer. Recommend close observation.     No orders of the defined types were placed in this encounter.    All questions were answered. The patient knows to call the clinic with any problems, questions or concerns. I spent 55 minutes counseling the patient face to face. The total time spent in the appointment was 60 minutes and more than 50% was on counseling.     [facility], [place], MD 05/31/2014 10:34 PM

## 2014-05-31 NOTE — Assessment & Plan Note (Signed)
His staging scans are pending. We will review his case at the next ENT tumor board next week. We discussed the importance of multidisciplinary approach.  in the meantime, we will continue close follow-up.

## 2014-05-31 NOTE — Assessment & Plan Note (Signed)
This is related to his malignancy. We will get dietitian to see him next week at the ENT multidisciplinary clinic.

## 2014-06-01 ENCOUNTER — Telehealth: Payer: Self-pay | Admitting: *Deleted

## 2014-06-01 DIAGNOSIS — H4011X3 Primary open-angle glaucoma, severe stage: Secondary | ICD-10-CM | POA: Diagnosis not present

## 2014-06-01 DIAGNOSIS — H4011X1 Primary open-angle glaucoma, mild stage: Secondary | ICD-10-CM | POA: Diagnosis not present

## 2014-06-01 DIAGNOSIS — Z961 Presence of intraocular lens: Secondary | ICD-10-CM | POA: Diagnosis not present

## 2014-06-01 NOTE — Progress Notes (Signed)
Met with patient during initial consult with Dr. Alvy Bimler:   1. Further introduced myself as his Navigator, explained my role as a member of the Care Team, provided contact information, encouraged him to contact me with questions/concerns as treatments/procedures begin. 2. Provided New Patient Information packet:  Contact information for physician and navigator  Advance Directive information ([place] blue pamphlet)  Fall Prevention Patient Safety Plan  [facility] campus map with highlight of [place] 3. We discussed his attendance at next week's Head & Neck multi-disciplinary clinic.  He understands he will be seen by Dr. Isidore Moos, PT, LCSW. 4. He stated he will bring a copy of Advance Directives to [facility] next week. 5. Showed him the location of Dr. Ritta Slot office and [facility] Radiology as reference for future appts, including arrival procedure for these appts.   6. Arranged with Radiology to have 12/17 PET rescheduled prior to 12/16 Tumor Board if opening becomes available. He verbalized understanding of information provided, understands he can contact me.  Gayleen Orem, RN, BSN, [place] at [place] [phone]

## 2014-06-01 NOTE — Telephone Encounter (Signed)
Called patient in f/u to his appt with Dr. Alvy Bimler yesterday. 1.  Answered his questions re: insurance coverage for Dr. Ritta Slot eval/services. 2.  Confirmed his attendance at the 12/16 H&N [place] with an arrival time of 1:00. He understands he can call me with future questions/concerns.  Gayleen Orem, RN, BSN, [place] at [place] [phone]

## 2014-06-05 ENCOUNTER — Ambulatory Visit
Admission: RE | Admit: 2014-06-05 | Discharge: 2014-06-05 | Disposition: A | Discharge status: Home / Self Care | Payer: Medicare Other | Source: Ambulatory Visit | Attending: Otolaryngology | Admitting: Otolaryngology

## 2014-06-05 DIAGNOSIS — K1379 Other lesions of oral mucosa: Secondary | ICD-10-CM | POA: Diagnosis not present

## 2014-06-05 DIAGNOSIS — K089 Disorder of teeth and supporting structures, unspecified: Secondary | ICD-10-CM | POA: Diagnosis not present

## 2014-06-06 ENCOUNTER — Telehealth: Payer: Self-pay | Admitting: *Deleted

## 2014-06-06 ENCOUNTER — Other Ambulatory Visit: Payer: Self-pay | Admitting: Internal Medicine

## 2014-06-06 ENCOUNTER — Encounter: Payer: Self-pay | Admitting: Radiation Oncology

## 2014-06-06 ENCOUNTER — Encounter (HOSPITAL_COMMUNITY)
Admission: RE | Admit: 2014-06-06 | Discharge: 2014-06-06 | Disposition: A | Discharge status: Home / Self Care | Payer: Medicare Other | Source: Ambulatory Visit | Attending: Otolaryngology | Admitting: Otolaryngology

## 2014-06-06 DIAGNOSIS — E119 Type 2 diabetes mellitus without complications: Secondary | ICD-10-CM | POA: Diagnosis not present

## 2014-06-06 DIAGNOSIS — C099 Malignant neoplasm of tonsil, unspecified: Secondary | ICD-10-CM | POA: Diagnosis not present

## 2014-06-06 LAB — GLUCOSE, CAPILLARY: GLUCOSE-CAPILLARY: 121 mg/dL — AB (ref 70–99)

## 2014-06-06 MED ORDER — FLUDEOXYGLUCOSE F - 18 (FDG) INJECTION
8.5000 | Freq: Once | INTRAVENOUS | Status: AC | PRN
  Administered 2014-06-06: 8.5 via INTRAVENOUS

## 2014-06-06 NOTE — Progress Notes (Signed)
Head and Neck Cancer Location of Tumor / Histology: Right Tonsil: Invasive Squamous Cell Carcinoma   [name]: First initial presentation was due to difficulties with opening his mouth and swelling around his neck. He was prescribed antibiotics around August with no improvement. He was referred to see a dentist and was noted to have no dental issues. He was subsequently referred to see an oral surgeon. He complained of intermittent shooting pain sensation around the right ear and about 9 pound weight loss with associated ear ache. Subsequently, in September through November, he started to have recurrent hemoptysis. His last episode was in 05/15/2014.      Biopsies of (if applicable) revealed: Tonsil, Right  INVASIVE SQUAMOUS CELL CARCINOMA  Nutrition Status Yes No Comments  Weight changes? [x]  []  4 LB WEIGHT LOSS SINCE 02/01/14  Swallowing concerns? []  []    PEG? []  []     Referrals Yes No Comments  Social Work? []  []    Dentistry? [x]  []   12 /18/15 [name]  Swallowing therapy? []  []    Nutrition? [x]  []  12/18./16 [name]  Med/Onc? []  []  06/01/15 - [name]   Safety Issues Yes No Comments  Prior radiation? []  [x]    Pacemaker/ICD? []  [x]    Possible current pregnancy? []  []   N/A  Is the patient on methotrexate? []  [x]     Tobacco/Marijuana/Snuff/ETOH use: Never smoker, 3-4 shots of Gin daily, No Illicit Drug Use  Past/Anticipated interventions by otolaryngology, if any: Dr. Jodi Marble- Biopsy of Right Tonsil  Past/Anticipated interventions by medical oncology, if any: Dr. Heath Lark     Current Complaints / other details: Stage 1  CLL - currently on observation

## 2014-06-06 NOTE — Telephone Encounter (Addendum)
Called patient to see if he had any questions prior to [facility] tomorrow.  He denied.  I reminded him of an arrival time of 1:00, indicated he will be seen by Dr. Isidore Moos and PT, reminded him to bring his Advance Directives.  He verbalized understanding.  Gayleen Orem, RN, BSN, [place] at [place] [phone]

## 2014-06-07 ENCOUNTER — Ambulatory Visit
Admission: RE | Admit: 2014-06-07 | Discharge: 2014-06-07 | Disposition: A | Discharge status: Home / Self Care | Payer: Medicare Other | Source: Ambulatory Visit | Attending: Radiation Oncology | Admitting: Radiation Oncology

## 2014-06-07 ENCOUNTER — Ambulatory Visit: Payer: Medicare Other | Attending: Radiation Oncology | Admitting: Physical Therapy

## 2014-06-07 ENCOUNTER — Encounter: Payer: Self-pay | Admitting: *Deleted

## 2014-06-07 ENCOUNTER — Other Ambulatory Visit: Payer: Self-pay | Admitting: Radiation Oncology

## 2014-06-07 ENCOUNTER — Encounter: Payer: Self-pay | Admitting: Hematology and Oncology

## 2014-06-07 ENCOUNTER — Encounter: Payer: Self-pay | Admitting: Physical Therapy

## 2014-06-07 VITALS — BP 133/61 | HR 70 | Temp 98.1°F | Wt 166.0 lb

## 2014-06-07 DIAGNOSIS — N3090 Cystitis, unspecified without hematuria: Secondary | ICD-10-CM

## 2014-06-07 DIAGNOSIS — C062 Malignant neoplasm of retromolar area: Secondary | ICD-10-CM | POA: Insufficient documentation

## 2014-06-07 DIAGNOSIS — R2689 Other abnormalities of gait and mobility: Secondary | ICD-10-CM | POA: Insufficient documentation

## 2014-06-07 DIAGNOSIS — R634 Abnormal weight loss: Secondary | ICD-10-CM

## 2014-06-07 DIAGNOSIS — M436 Torticollis: Secondary | ICD-10-CM

## 2014-06-07 DIAGNOSIS — R293 Abnormal posture: Secondary | ICD-10-CM | POA: Diagnosis not present

## 2014-06-07 HISTORY — DX: Chronic lymphocytic leukemia of B-cell type not having achieved remission: C91.10

## 2014-06-07 HISTORY — DX: Malignant neoplasm of tonsil, unspecified: C09.9

## 2014-06-07 LAB — URINALYSIS, MICROSCOPIC - CHCC
BLOOD: NEGATIVE
Bilirubin (Urine): NEGATIVE
GLUCOSE UR CHCC: NEGATIVE mg/dL
KETONES: 40 mg/dL
Nitrite: NEGATIVE
PH: 8 (ref 4.6–8.0)
Protein: 30 mg/dL
SPECIFIC GRAVITY, URINE: 1.01 (ref 1.003–1.035)
Urobilinogen, UR: 0.2 mg/dL (ref 0.2–1)

## 2014-06-07 NOTE — Progress Notes (Signed)
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 Squamous cell carcinoma of retromolar trigone 145.6 C06.2     HISTORY OF PRESENT ILLNESS::Timothy Kim is an 80 y.o. male who presented with difficulties with opening his mouth and swelling around his Right inner jaw and trismus. He was prescribed antibiotics around August with no improvement. He was referred to see a dentist and was noted to have no dental issues. He was subsequently referred to see an oral surgeon but did not go. He complained of intermittent shooting pain sensation around the right ear and about 8 pound weight loss with associated ear ache. Subsequently, in September through November, he started to spit up blood and felt a growth in his inner jaw.  He was then referred to Dr. Erik Obey.  Biopsy on 93-1-12 by Dr. Erik Obey: The carcinoma cells are essentially negative for p16. (JBK:kh 05-31-14) Enid Cutter MD Pathologist, Electronic Signature ( Signed 05/31/2014) FINAL DIAGNOSIS Diagnosis Tonsil, biopsy, right - INVASIVE SQUAMOUS CELL CARCINOMA. - SEE COMMENT.  CT of neck on 06-05-14 revealed  IMPRESSION: 1. Right retro molar trigone lesion measures at least 19 x 20 x 25 mm. 2. Osseous erosion of the posterior right maxilla 3. No osseous erosion or foraminal enlargement to suggest perineural spread of tumor. 4. Asymmetric right-sided level 1, level 2, and level 3 nodes are concerning for metastatic disease, particularly in the level 2 Station.   PET on 16-24-46: Hypermetabolic asymmetric soft tissue density in the superior right oropharynx, just below the level of the  pterygoid plates. This is consistent with known primary tonsillar carcinoma.  No evidence of hypermetabolic cervical lymph nodes or definite distant metastatic disease.  1.8 cm right lower lobe nodule shows no metabolic activity, suggesting benign etiology. Recommend close followup by CT in 3-4 months.  Asymmetric wall thickening of urinary bladder, which could be due to cystitis or bladder carcinoma. Recommend correlation with urinalysis and consider cystoscopy for further evaluation.  (per discussion with radiology at ENT board, this is likely related to an empty bladder at probably not pathologic.  Consensus is to perform a UA)  He has been discussed at ENT tumor board. This is favored to be a right retromolar primary with bone invasion.  He has CLL and is undergoing observation.  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    PAST SURGICAL HISTORY: Past Surgical History  Procedure Laterality Date  . Hernia repair      inguinal  . Rotator cuff repair    . Laminectomy    . Retinal detachment surgery    . Cataract extraction      FAMILY HISTORY: family history includes Alcohol abuse in his sister; Cancer in his mother. There is no history of Hypertension or Sudden death.  SOCIAL HISTORY:  reports that he has never smoked. He has never used smokeless tobacco. He reports that he drinks about 1.8 - 2.4 oz of alcohol per week. He reports that  he does not use illicit drugs.  ALLERGIES: Review of patient's allergies indicates no known allergies.  MEDICATIONS:  Current Outpatient Prescriptions  Medication Sig Dispense Refill  .  benazepril-hydrochlorthiazide (LOTENSIN HCT) 20-12.5 MG per tablet TAKE 1 TABLET EVERY DAY 90 tablet 3  . brimonidine-timolol (COMBIGAN) 0.2-0.5 % ophthalmic solution Place 1 drop into both eyes every 12 (twelve) hours.      Marland Kitchen DHEA 50 MG TABS Take 1 tablet by mouth daily.    Javier Docker Oil 300 MG CAPS Take 300 mg by mouth daily.    Marland Kitchen latanoprost (XALATAN) 0.005 % ophthalmic solution   4  . Lutein-Zeaxanthin 15-0.7 MG CAPS Take 1 tablet by mouth daily.    . metoprolol succinate (TOPROL-XL) 100 MG 24 hr tablet TAKE 1 TABLET IMMEDIATELY FOLLOWING A MEAL 90 tablet 3  . tadalafil (CIALIS) 20 MG tablet Take 1 tablet (20 mg total) by mouth daily as needed for erectile dysfunction. 10 tablet 3  . triamcinolone cream (KENALOG) 0.1 % APPLY TO AFFECTED AREA TWICE DAILY 80 g 1  . zolpidem (AMBIEN) 5 MG tablet TAKE 1 TABLET AT BEDTIME AS NEEDED FOR SLEEP 30 tablet 2  . sildenafil (VIAGRA) 100 MG tablet Take 1 tablet (100 mg total) by mouth as needed for erectile dysfunction. (Patient not taking: Reported on 06/07/2014) 3 tablet 6   No current facility-administered medications for this encounter.    REVIEW OF SYSTEMS:  Notable for that above.   PHYSICAL EXAM:  weight is 166 lb (75.297 kg). His temperature is 98.1 F (36.7 C). His blood pressure is 133/61 and his pulse is 70. His oxygen saturation is 100%.   General: Alert and oriented, in no acute distress HEENT: Head is normocephalic. Extraocular movements are intact. Oropharynx -- right retromolar trigone mass with smooth mucosal surface, hard to visualize completely due to trismus and gag reflex. Neck: Neck is supple, no palpable cervical or supraclavicular lymphadenopathy. Heart: Regular in rate and rhythm with no murmurs, rubs, or gallops. Chest: Clear to auscultation bilaterally, with no rhonchi, wheezes, or rales. Abdomen: Soft, nontender, nondistended, with no rigidity or guarding. Extremities: No cyanosis or edema. Lymphatics: see Neck Exam Skin:  No concerning lesions. Musculoskeletal: symmetric strength and muscle tone throughout. Neurologic: Cranial nerves II through XII are grossly intact. No obvious focalities. Speech is fluent. Coordination is intact. Psychiatric: Judgment and insight are intact. Affect is appropriate.   ECOG = 1  0 - Asymptomatic (Fully active, able to carry on all predisease activities without restriction)  1 - Symptomatic but completely ambulatory (Restricted in physically strenuous activity but ambulatory and able to carry out work of a light or sedentary nature. For example, light housework, office work)  2 - Symptomatic, <50% in bed during the day (Ambulatory and capable of all self care but unable to carry out any work activities. Up and about more than 50% of waking hours)  3 - Symptomatic, >50% in bed, but not bedbound (Capable of only limited self-care, confined to bed or chair 50% or more of waking hours)  4 - Bedbound (Completely disabled. Cannot carry on any self-care. Totally confined to bed or chair)  5 - Death   Eustace Pen MM, Creech RH, Tormey DC, et al. [phone]). "Toxicity and response criteria of the [facility]". [place] Oncol. 5 (6): 649-55   LABORATORY DATA:  Lab Results  Component Value Date   WBC 11.6* 07/19/2013   HGB 14.2 07/19/2013   HCT 42.4 07/19/2013   MCV  105.5* 07/19/2013   PLT 184 07/19/2013   CMP     Component Value Date/Time   NA 142 07/19/2013 1136   NA 143 01/07/2012 0936   K 4.3 07/19/2013 1136   K 4.1 01/07/2012 0936   CL 103 01/07/2012 0936   CO2 31* 07/19/2013 1136   CO2 29 01/07/2012 0936   GLUCOSE 127 07/19/2013 1136   GLUCOSE 98 01/07/2012 0936   BUN 14.0 07/19/2013 1136   BUN 16 01/07/2012 0936   CREATININE 0.9 07/19/2013 1136   CREATININE 1.0 01/07/2012 0936   CALCIUM 9.4 07/19/2013 1136   CALCIUM 9.3 01/07/2012 0936   PROT 6.9 07/19/2013 1136   PROT 7.2 01/07/2012 0936   ALBUMIN 3.7 07/19/2013 1136   ALBUMIN 4.0  01/07/2012 0936   AST 42* 07/19/2013 1136   AST 47* 01/07/2012 0936   ALT 27 07/19/2013 1136   ALT 28 01/07/2012 0936   ALKPHOS 73 07/19/2013 1136   ALKPHOS 59 01/07/2012 0936   BILITOT 0.92 07/19/2013 1136   BILITOT 1.0 01/07/2012 0936   GFRNONAA 81.57 12/12/2009 0000   GFRAA 121 08/09/2008 1103         RADIOGRAPHY: Ct Soft Tissue Neck W Contrast  06/05/2014   CLINICAL DATA:  Mouth pain. Right retromolar lesion. Right cheek swelling.  BUN and creatinine were obtained on site at [facility] at [address]Results: BUN 5.0 mg/dL, Creatinine 0.9 mg/dL.  EXAM: CT NECK WITH CONTRAST  TECHNIQUE: Multidetector CT imaging of the neck was performed using the standard protocol following the bolus administration of intravenous contrast.  CONTRAST:  75 mL Omnipaque 300  COMPARISON:  MRI of the head 05/22/2010  FINDINGS: A right retromolar trigone lesion measures at least 19 x 20 x 25 mm. There is osseous erosion of the posterior right mandibular alveolar ridge. The mandible is intact.  The foramina ovale is of normal size. The sphenopalatine canal and the pterygopalatine fossa are clear.  Small bilateral submandibular lymph nodes are present. The largest right-sided node measures 6 x 9.5 mm. Asymmetric right-sided level 2 lymph nodes are present measuring up to 12 x 7.5 mm. A right level 3 lymph node measures 10 mm in long axis.  No significant mucosal or submucosal lesions are present. The vocal cords are midline and symmetric. The thyroid is within normal limits. The lung apices are clear.  The bone windows demonstrate fusion across the C2-3 disc space. Degenerative anterolisthesis is present at C4-5. Facet degenerative changes are worse on the left at C3-4 and C4-5. There is chronic loss of disc height at C5-6 with uncovertebral spurring bilaterally. No focal lytic or blastic lesions are present in the cervical spine. Erosive changes a posterior right mandible are as noted. Note is made of a  torus palatini.  IMPRESSION: 1. Right retro molar trigone lesion measures at least 19 x 20 x 25 mm. 2. Osseous erosion of the posterior right mandibular alveolar ridge. 3. No osseous erosion or foraminal enlargement to suggest perineural spread of tumor. 4. Asymmetric right-sided level 1, level 2, and level 3 nodes are concerning for metastatic disease, particularly in the level 2 station. 5. Moderate spondylosis of the cervical spine without evidence for distant metastases of the bone.   Electronically Signed   By: Lawrence Santiago M.D.   On: 06/05/2014 13:41   Nm Pet Image Initial (pi) Skull Base To Thigh  06/06/2014   CLINICAL DATA:  Initial treatment strategy for tonsillar carcinoma.  EXAM: NUCLEAR MEDICINE PET SKULL BASE  TO THIGH  TECHNIQUE: 8.5 mCi F-18 FDG was injected intravenously. Full-ring PET imaging was performed from the skull base to thigh after the radiotracer. CT data was obtained and used for attenuation correction and anatomic localization.  FASTING BLOOD GLUCOSE:  Value: 121 mg/dl  COMPARISON:  None.  FINDINGS: NECK  Focal hypermetabolic activity is seen corresponding to asymmetric soft tissue density in the superior right oropharynx just below the level of the pterygoid plates. This has an SUV max of 13.5. This is consistent with known history of primary tonsillar carcinoma.  No hypermetabolic cervical lymph nodes are identified.  CHEST  No hypermetabolic mediastinal or hilar nodes. A 1.8 cm right lower lobe pulmonary nodule is seen on image 47 which shows absence of hypermetabolic activity. A 3 mm pulmonary nodule is seen in the medial left lower lobe on image 52, series 6.  ABDOMEN/PELVIS  No abnormal hypermetabolic activity within the liver, pancreas, adrenal glands, or spleen. No hypermetabolic lymph nodes in the abdomen or pelvis.  CT images show asymmetric wall thickening in the urinary bladder. This could be due to cystitis or neoplasm. Sigmoid diverticulosis noted, without evidence of  diverticulitis.  SKELETON  No focal hypermetabolic activity to suggest skeletal metastasis.  IMPRESSION: Hypermetabolic asymmetric soft tissue density in the superior right oropharynx, just below the level of the pterygoid plates. This is consistent with known primary tonsillar carcinoma.  No evidence of hypermetabolic cervical lymph nodes or definite distant metastatic disease.  1.8 cm right lower lobe nodule shows no metabolic activity, suggesting benign etiology. Recommend close followup by CT in 3-4 months.  Asymmetric wall thickening of urinary bladder, which could be due to cystitis or bladder carcinoma. Recommend correlation with urinalysis and consider cystoscopy for further evaluation.   Electronically Signed   By: Earle Gell M.D.   On: 06/06/2014 12:40      IMPRESSION/PLAN: This is a delightful 80 year old man with stage IVA T4aN0M0 squamous cell carcinoma of the right retromolar trigone;  positive ETOH use /  Negative tobacco/smoking history. The patient has bone invasion per imaging and per tumor board discussion he likely needs surgery up front.  Dr Erik Obey plans to refer him to [facility].  After surgery he will likely be a candidate for radiotherapy. The patient has been discussed in detail at tumor board and seen in the context of multidisciplinary clinic today. Plan is as below:   1) The patient has met with med/onc to discuss chemotherapy.    2) Referral has been made to dentistry for dental evaluation/extractions / trismus issues in preparation for radiation in the vicinity of the mouth  3) Will refer to see Polo Riley from social work for social support  4) Will refer to nutrition for nutrition support   5) Will refer to swallowing therapy for evaluation and prophylactic treatment as needed for dysphagia, which can worsen during or after chemoradiotherapy.   6) Physical therapist will see patient today in [facility] clinic for neck measurements due to risk of lymphedema in neck; may  benefit from PT for this after completion of radiotherapy. The patient also may benefit from PT for potentional deconditioning after treatment    7) Simulation once cleared by ENT.  Anticipate 6-6.5 weeks of RT; would like to start this within 6 weeks of surgery (hence see him within 3-4 weeks of surgery to start tx planning). He would like an open face mask due to claustrophobia  8) Baseline TSH lab due to risk of hypothyroidism from RT  9) UA due to bladder thickening on PET   10) The patient continues to use ETOH -- 3-4 drinks daily. The patient was counseled to stop ETOH as this may have caused his cancer and was offered support/ referrals to community resources to help with this if needed.  It was a pleasure meeting the patient today. We discussed the risks, benefits, and side effects of radiotherapy.   We talked in detail about acute and late effects. He understands that some of the most bothersome acute effects will be significant soreness of the mouth and throat, changes in taste, changes in salivary function, skin irritation, hair loss, dehydration, weight loss and fatigue. We talked about late effects which include but are not necessarily limited to dysphagia, hypothyroidism, dry mouth, trismus, neck edema and nerve or spinal cord injury. No guarantees of treatment were given. A consent form was signed and placed in the patient's medical record. The patient is enthusiastic about proceeding with treatment. I look forward to participating in the patient's care.  __________________________________________   Eppie Gibson, MD

## 2014-06-07 NOTE — Therapy (Signed)
[facility] [place], [place], [address] Phone: [phone]   Fax:  [phone]  Physical Therapy Evaluation  Patient Details  Name: Timothy Kim MRN: [id number] Date of Birth: 1929/06/25  Encounter Date: 06/07/2014      PT End of Session - 06/07/14 1345    Visit Number 1   Number of Visits 1   Date for PT Re-Evaluation 08/07/14   PT Start Time 1250   PT Stop Time 1323   PT Time Calculation (min) 33 min   Activity Tolerance Patient tolerated treatment well      Past Medical History  Diagnosis Date  . ALLERGIC RHINITIS 02/09/2008    Qualifier: Diagnosis of  By: Burnice Logan  MD, [place] HYPERTROPHY 03/03/2007    Qualifier: Diagnosis of  By: Rogue Bussing CMA, Maryann Alar    . DIABETES MELLITUS, TYPE II 03/03/2007    Qualifier: Diagnosis of  By: Rogue Bussing CMA, Maryann Alar    . GERD 03/03/2007    Qualifier: Diagnosis of  By: Rogue Bussing CMA, Maryann Alar    . HYPERTENSION 03/03/2007    Qualifier: Diagnosis of  By: Rogue Bussing CMA, Maryann Alar    . LOW BACK PAIN 06/09/2007    Qualifier: Diagnosis of  By: Burnice Logan  MD, Doretha Sou   . 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 OSTEOARTHRITIS 06/09/2007    Qualifier: Diagnosis of  By: Burnice Logan  MD, Doretha Sou   . Glaucoma   . CLL (chronic lymphocytic leukemia)     Stage 1  . Tonsillar cancer     Right Tonsil    Past Surgical History  Procedure Laterality Date  . Hernia repair      inguinal  . Rotator cuff repair    . Laminectomy    . Retinal detachment surgery    . Cataract extraction      There were no vitals taken for this visit.  Visit Diagnosis:  Posture abnormality - Plan: PT plan of care cert/re-cert  Impairment of balance - Plan: PT plan of care cert/re-cert  Stiffness of neck - Plan: PT plan of care cert/re-cert      Subjective Assessment - 06/07/14 1333    Symptoms No complaints today.     Pertinent History Initial presentation for difficulties with opening his mouth and swelling in his neck.  Was referred to several providers for diagnosis.  Had intermittent shooting pain at right ear and 9 pound weight loss.  Developed recurrrent hemoptysis.  Reports spinal stenosis.  Also reports some concern about his balance[place]   Currently in Pain? No/denies          [facility] PT Assessment - 06/07/14 0001    Assessment   Medical Diagnosis invasive squamus cell carcinoma of right tonsil   Precautions   Precautions Other (comment)   Precaution Comments cancer precautions   Restrictions   Weight Bearing Restrictions No   Balance Screen   Has the patient fallen in the past 6 months No   Has the patient had a decrease in activity level because of a fear of falling?  No  has some concern about balance on steep terrain-mtn. home   Is the patient reluctant to leave their home because of a fear of falling?  No   Home Environment   Living Enviornment Private residence   Living Arrangements Spouse/significant other   Available  Help at Discharge Family   Type of [place] to enter   [place] One level   Prior Function   Level of Independence Independent with basic ADLs;Independent with gait   Leisure likes to do yardwork; this was his only form of exercise, though he owns a stationary bike   Observation/Other Assessments   Observations neck tissue is soft to palpation currently   Functional Tests   Functional tests Sit to Stand   Sit to Stand   Comments 10 repetitions in 30 seconds   Posture/Postural Control   Posture/Postural Control Postural limitations   Postural Limitations Rounded Shoulders;Forward head   Posture Comments significant forward head   AROM   Overall AROM  Deficits   Overall AROM Comments shoulders WFL for his age with minor limitations; pt. reports right shoulder crepitus with  some activity   Cervical Flexion WFL   Cervical Extension 50% loss   Cervical - Right Side Bend 50% loss   Cervical - Left Side Bend 50% loss   Cervical - Right Rotation 25% loss   Cervical - Left Rotation 25% loss   Ambulation/Gait   Ambulation/Gait Yes   Ambulation/Gait Assistance 7: Independent   Stairs Yes   Stairs Assistance 6: Modified independent (Device/Increase time)  says he is slow on stairs            PT Education - 06-08-14 1345    Education provided Yes   Education Details posture, neck AROM, riding stationary bike or walking, lymphedema info   Person(s) Educated Patient   Methods Explanation;Handout   Comprehension Verbalized understanding              Plan - Jun 08, 2014 1346    Clinical Impression Statement Pt. with no regular exercise program and limited neck ROM also expresses some concern about his balance that has caused him to decrease [place] may benefit fromm therapy goiong forward for these issues, or if lymphedema develops.   Pt will benefit from skilled therapeutic intervention in order to improve on the following deficits Decreased range of motion;Decreased balance   Rehab Potential Good   PT Frequency One time visit   PT Next Visit Plan None at this time; if therapy is warranted as cancer treatment progresses, reassessment with comparison to today's baselines will be done.   PT Home Exercise Plan See education section; asked pt. to start tomorrow with this.   Consulted and Agree with Plan of Care Patient          G-Codes - 06-08-2014 1351    Functional Assessment Tool Used clinical judgement   Functional Limitation Changing and maintaining body position   Changing and Maintaining Body Position Current Status [phone]) At least 1 percent but less than 20 percent impaired, limited or restricted   Changing and Maintaining Body Position Goal Status (J8563) At least 1 percent but less than 20 percent impaired,  limited or restricted   Changing and Maintaining Body Position Discharge Status (J4970) At least 1 percent but less than 20 percent impaired, limited or restricted            LYMPHEDEMA/ONCOLOGY QUESTIONNAIRE - 06/08/14 1344    Type   Cancer Type squamous cell of tonsil   Lymphedema Assessments   Lymphedema Assessments Head and Neck   Head and Neck   4 cm superior to sternal notch around neck 40.9 cm   6 cm superior to sternal notch around neck  42.5 cm   8 cm superior to sternal notch around neck 42.8 cm                         Head and Neck Clinic Goals - 06/07/14 1351    Patient will be able to verbalize understanding of a home exercise program for cervical range of motion, posture, and walking.    Status Achieved   Patient will be able to verbalize understanding of proper sitting and standing posture.    Status Achieved   Patient will be able to verbalize understanding of lymphedema risk and availability of treatment for this condition.    Status Achieved       Problem List Patient Active Problem List   Diagnosis Date Noted  . Abnormal weight loss 05/31/2014  . Hemoptysis 05/31/2014  . Cancer of the lip, oral cavity, and pharynx 05/30/2014  . CLL (chronic lymphocytic leukemia) 07/07/2012  . ALLERGIC RHINITIS 02/09/2008  . 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 OSTEOARTHRITIS 06/09/2007  . LOW BACK PAIN 06/09/2007  . DIABETES MELLITUS, TYPE II 03/03/2007  . HYPERTENSION 03/03/2007  . GERD 03/03/2007  . BENIGN PROSTATIC HYPERTROPHY 03/03/2007    Anshi Jalloh 06/07/2014, 1:53 PM   Korrina Zern, PT

## 2014-06-08 ENCOUNTER — Ambulatory Visit: Payer: Medicare Other | Admitting: Hematology and Oncology

## 2014-06-08 ENCOUNTER — Ambulatory Visit (HOSPITAL_COMMUNITY): Admission: RE | Admit: 2014-06-08 | Payer: Medicare Other | Source: Ambulatory Visit

## 2014-06-08 ENCOUNTER — Telehealth: Payer: Self-pay | Admitting: *Deleted

## 2014-06-08 LAB — TSH CHCC: TSH: 1.315 m[IU]/L (ref 0.320–4.118)

## 2014-06-08 NOTE — Telephone Encounter (Signed)
CALLED PATIENT TO INFORM OF APPT. WITH Timothy Kim ON 06-19-14- ARRIVAL TIME - 1 PM, LVM FOR A RETURN CALL.

## 2014-06-08 NOTE — Progress Notes (Signed)
Met with patient during his [facility] consult with Dr. Isidore Moos:   1. He verbalized understanding of recommended surgery at [facility] followed by RT s/p healing. 2. He verbalized understanding of reducing/stopping ETOH consumption. 3. I provided him a tour of CT SIM, [place], explained arrival and preparation procedure.  He verbalized understanding. 4. I explained I will continue to be available to him as he moves forward with surgery.  He verbalized understanding.  Gayleen Orem, RN, BSN, [place] at [place] [phone]

## 2014-06-09 ENCOUNTER — Ambulatory Visit (HOSPITAL_COMMUNITY): Payer: Self-pay | Admitting: Dentistry

## 2014-06-09 ENCOUNTER — Ambulatory Visit: Payer: Medicare Other | Admitting: Nutrition

## 2014-06-09 ENCOUNTER — Encounter (HOSPITAL_COMMUNITY): Payer: Self-pay | Admitting: Dentistry

## 2014-06-09 VITALS — BP 125/72 | HR 65 | Temp 98.4°F

## 2014-06-09 DIAGNOSIS — C062 Malignant neoplasm of retromolar area: Secondary | ICD-10-CM

## 2014-06-09 DIAGNOSIS — M898X0 Other specified disorders of bone, multiple sites: Secondary | ICD-10-CM

## 2014-06-09 DIAGNOSIS — M270 Developmental disorders of jaws: Secondary | ICD-10-CM

## 2014-06-09 DIAGNOSIS — M264 Malocclusion, unspecified: Secondary | ICD-10-CM

## 2014-06-09 DIAGNOSIS — M2630 Unspecified anomaly of tooth position of fully erupted tooth or teeth: Secondary | ICD-10-CM

## 2014-06-09 DIAGNOSIS — K0530 Chronic periodontitis, unspecified: Secondary | ICD-10-CM

## 2014-06-09 DIAGNOSIS — Z0189 Encounter for other specified special examinations: Secondary | ICD-10-CM

## 2014-06-09 DIAGNOSIS — K031 Abrasion of teeth: Secondary | ICD-10-CM

## 2014-06-09 DIAGNOSIS — K036 Deposits [accretions] on teeth: Secondary | ICD-10-CM

## 2014-06-09 DIAGNOSIS — Z01818 Encounter for other preprocedural examination: Secondary | ICD-10-CM

## 2014-06-09 MED ORDER — SODIUM FLUORIDE 1.1 % DT CREA: TOPICAL_CREAM | DENTAL | Status: DC

## 2014-06-09 NOTE — Progress Notes (Signed)
80 year old male diagnosed with tonsil cancer.  Past medical history includes alcohol usage, CLL, DM 2, GERD, and hypertension.  Medications include krill oil.  Labs include hemoglobin A1c 5.5.  Height: 5 feet 6 inches. Weight: 166 pounds, December 16. Usual body weight: 182 pounds. BMI: 27.  Patient reports he drinks Equate oral nutrition supplement on a regular basis.  He denies current nutrition impact symptoms.  Patient has lost 9% of his usual body weight.  Nutrition diagnosis: Food and nutrition related knowledge deficit related to diagnosis of tonsil cancer and associated treatments as evidenced by no prior need for nutrition related information.  Intervention: Patient educated to consume small frequent meals with high-calorie, high-protein foods. Reviewed high protein foods with patient. Recommended patient continue oral nutrition supplements as needed to promote weight gain/Stabilization.. Provided fact sheets and and contact information. Questions were answered.  Teach back method used.  Monitoring, evaluation, goals: Patient will tolerate adequate calories and protein to minimize weight loss throughout treatment.  Next visit: To be scheduled with radiation therapy.  **Disclaimer: This note was dictated with voice recognition software. Similar sounding words can inadvertently be transcribed and this note may contain transcription errors which may not have been corrected upon publication of note.**

## 2014-06-09 NOTE — Progress Notes (Signed)
DENTAL CONSULTATION  Date of Consultation:  06/09/2014 Patient Name:   Timothy Kim Date of Birth:   1930-03-25 Medical Record Number: [id number]  VITALS: BP 125/72 mmHg  Pulse 65  Temp(Src) 98.4 F (36.9 C) (Oral)  CHIEF COMPLAINT: Patient was referred by Dr. Alvy Bimler for a dental consultation.   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 Patient with anticipated surgical resection followed by possible chemoradiation therapy. Patient is now seen as part of a pre-chemoradiation therapy dental protocol examination.  Patient currently denies acute toothaches, swellings, or abscesses. Patient is was last seen by his primary dentist, Dr. Mohammed Kindle, on 02/22/2014 for an exam and cleaning. Patient is seen on an every 6 month basis. Patient has a lower partial denture that was made several years ago. However, patient no longer wears the lower partial denture. Patient denies having an upper partial denture.  PROBLEM LIST: Patient Active Problem List   Diagnosis Date Noted  . Squamous cell carcinoma of retromolar trigone 06/07/2014    Priority: High  . Abnormal weight loss 05/31/2014  . Hemoptysis 05/31/2014  . Cancer of the lip, oral cavity, and pharynx 05/30/2014  . CLL (chronic lymphocytic leukemia) 07/07/2012  . ALLERGIC RHINITIS 02/09/2008  . 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 OSTEOARTHRITIS 06/09/2007  . LOW BACK PAIN 06/09/2007  . DIABETES MELLITUS, TYPE II 03/03/2007  . HYPERTENSION 03/03/2007  . GERD 03/03/2007  . BENIGN PROSTATIC HYPERTROPHY 03/03/2007    PMH: Past Medical History  Diagnosis Date  . ALLERGIC RHINITIS 02/09/2008    Qualifier: Diagnosis of  By: Burnice Logan  MD, [place] HYPERTROPHY 03/03/2007    Qualifier: Diagnosis of  By: Rogue Bussing CMA, Maryann Alar    . DIABETES MELLITUS, TYPE II 03/03/2007    Qualifier: Diagnosis of  By: Rogue Bussing CMA, Maryann Alar     . GERD 03/03/2007    Qualifier: Diagnosis of  By: Rogue Bussing CMA, Maryann Alar    . HYPERTENSION 03/03/2007    Qualifier: Diagnosis of  By: Rogue Bussing CMA, Maryann Alar    . LOW BACK PAIN 06/09/2007    Qualifier: Diagnosis of  By: Burnice Logan  MD, Doretha Sou   . 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 OSTEOARTHRITIS 06/09/2007    Qualifier: Diagnosis of  By: Burnice Logan  MD, Doretha Sou   . Glaucoma   . CLL (chronic lymphocytic leukemia)     Stage 1  . Tonsillar cancer     Right Tonsil    PSH: Past Surgical History  Procedure Laterality Date  . Hernia repair      inguinal  . Rotator cuff repair    . Laminectomy    . Retinal detachment surgery    . Cataract extraction      ALLERGIES: No Known Allergies  MEDICATIONS: Current Outpatient Prescriptions  Medication Sig Dispense Refill  . benazepril-hydrochlorthiazide (LOTENSIN HCT) 20-12.5 MG per tablet TAKE 1 TABLET EVERY DAY 90 tablet 3  . brimonidine-timolol (COMBIGAN) 0.2-0.5 % ophthalmic solution Place 1 drop into both eyes every 12 (twelve) hours.      Marland Kitchen DHEA 50 MG TABS Take 1 tablet by mouth daily.    Javier Docker Oil 300 MG CAPS Take 300 mg by mouth daily.    Marland Kitchen latanoprost (XALATAN) 0.005 % ophthalmic solution   4  . Lutein-Zeaxanthin 15-0.7 MG CAPS Take 1 tablet by mouth daily.    Marland Kitchen  metoprolol succinate (TOPROL-XL) 100 MG 24 hr tablet TAKE 1 TABLET IMMEDIATELY FOLLOWING A MEAL 90 tablet 3  . sildenafil (VIAGRA) 100 MG tablet Take 1 tablet (100 mg total) by mouth as needed for erectile dysfunction. (Patient not taking: Reported on 06/07/2014) 3 tablet 6  . tadalafil (CIALIS) 20 MG tablet Take 1 tablet (20 mg total) by mouth daily as needed for erectile dysfunction. 10 tablet 3  . triamcinolone cream (KENALOG) 0.1 % APPLY TO AFFECTED AREA TWICE DAILY 80 g 1  . zolpidem (AMBIEN) 5 MG tablet TAKE 1 TABLET AT BEDTIME AS NEEDED FOR SLEEP 30 tablet 2   No current facility-administered  medications for this visit.    LABS: Lab Results  Component Value Date   WBC 11.6* 07/19/2013   HGB 14.2 07/19/2013   HCT 42.4 07/19/2013   MCV 105.5* 07/19/2013   PLT 184 07/19/2013      Component Value Date/Time   NA 142 07/19/2013 1136   NA 143 01/07/2012 0936   K 4.3 07/19/2013 1136   K 4.1 01/07/2012 0936   CL 103 01/07/2012 0936   CO2 31* 07/19/2013 1136   CO2 29 01/07/2012 0936   GLUCOSE 127 07/19/2013 1136   GLUCOSE 98 01/07/2012 0936   BUN 14.0 07/19/2013 1136   BUN 16 01/07/2012 0936   CREATININE 0.9 07/19/2013 1136   CREATININE 1.0 01/07/2012 0936   CALCIUM 9.4 07/19/2013 1136   CALCIUM 9.3 01/07/2012 0936   GFRNONAA 81.57 12/12/2009 0000   GFRAA 121 08/09/2008 1103   No results found for: INR, PROTIME No results found for: PTT  SOCIAL HISTORY: History   Social History  . Marital Status: Single    Spouse Name: N/A    Number of Children: 2  . Years of Education: N/A   Occupational History  . Not on file.   Social History Main Topics  . Smoking status: Never Smoker   . Smokeless tobacco: Never Used  . Alcohol Use: 1.8 - 2.4 oz/week    3-4 Shots of liquor per week     Comment: Gin and vodka daily  . Drug Use: No  . Sexual Activity: Not on file   Other Topics Concern  . Not on file   Social History Narrative   Patient is divorced with 2 children.   Patient has never smoked. Patient has never used smokeless tobacco.    Patient drinks gin or vodka on a daily basis.    FAMILY HISTORY: Family History  Problem Relation Age of Onset  . Cancer Mother     lung ca  . Alcohol abuse Sister   . Hypertension Neg Hx     family hx  . Sudden death Neg Hx     famiylhx    REVIEW OF SYSTEMS:  Reviewed with the patient included in dental record.  DENTAL HISTORY: CHIEF COMPLAINT: Patient was referred by Dr. Alvy Bimler for a dental consultation.   HPI: Timothy Kim is an 80 year-old male recently diagnosed with squamous cell carcinoma of the right  retromolar trigone/tonsil. Patient with anticipated surgical resection followed by possible chemoradiation therapy. Patient is now seen as part of a pre-chemoradiation therapy dental protocol examination.  Patient currently denies acute toothaches, swellings, or abscesses. Patient is was last seen by his primary dentist, Dr. Bennye Alm, on 02/22/2014 for an exam and cleaning. Patient is seen on an every 6 month basis. Patient has a lower partial denture that was made several years ago. However, patient no longer wears the lower  partial denture. Patient denies having an upper partial denture.  DENTAL EXAMINATION: GENERAL: The patient is a well-developed, well-nourished male in no acute distress. HEAD AND NECK: Right neck lymphadenopathy is palpated. No left neck lymphadenopathy is noted. Patient currently has significant trismus with maximum interincisal opening of 17 mm. INTRAORAL EXAM: Patient has normal saliva. Patient has a large mid palatal torus. Patient has bilateral mandibular lingual exostoses from #19 through #30. DENTITION: The patient is missing tooth numbers 1, 14, 15, 16, 17, 18, 19, 20, 30, and 32. PERIODONTAL: Patient has chronic periodontitis with minimal plaque accumulations, selective areas of gingival recession, and no significant tooth mobility. There is incipient to moderate bone loss noted. DENTAL CARIES/SUBOPTIMAL RESTORATIONS: There are no obvious dental caries noted. Patient has multiple abfraction/flexure lesions noted. ENDODONTIC: The patient currently denies acute pulpitis symptoms. There is no evidence of periapical pathology noted on the [place]: Patient has multiple crown restorations on tooth numbers 2, 3, 4, 5, and 31. These appear to be acceptable. PROSTHODONTIC: Patient has a history of the lower partial denture. This was fabricated and approximately 2002 and the patient a longer wears the lower partial denture. Patient denies ever having had a  maxillary partial denture. OCCLUSION: The patient has a anterior crossbite consistent with a class III malocclusion. Patient also has multiple rotated teeth. Multiple diastemas are noted.  RADIOGRAPHIC INTERPRETATION: An orthopantogram was obtained today. I was unable to obtain full series of dental radiographs due to significant trismus. Patient has missing tooth numbers 1, 14, 15, 16, 17, 18, 19, 20, 30, and 32. There is incipient to moderate bone loss. No periapical radiolucencies are noted. Patient has radiopaque areas consistent with a bilateral mandibular lingual exostoses and mid palatal torus. Multiple diastemas are noted.   ASSESSMENTS: 1. Squamous cell carcinoma of the right tonsil and retromolar trigone area. 2. Anticipated surgical resection followed by chemoradiation therapy. 3. Pre-chemotherapy ration therapy dental protocol examination 4. Chronic periodontitis with bone loss 5. Accretions-minimal 6. Selective areas of gingival recession 7. Large mid palatal torus 8. Bilateral mandibular lingual exostosis from area #19 through #30. 9. Multiple missing teeth. 10. Class III malocclusion with anterior crossbite 11. Multiple diastemas 12. Multiple rotated teeth 13. 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 I discussed the risks, benefits, and complications of various treatment options with the patient in relationship to her medical and dental conditions. We discussed various treatment options to include no treatment, multiple extractions with alveoloplasty, pre-prosthetic surgery as indicated, periodontal therapy, dental restorations, root canal therapy, crown and bridge therapy, implant therapy, and replacement of missing teeth as indicated. The patient currently is being referred to [facility] for surgical resection consultation. I suggest evaluation by the oral surgeons at [facility] as well for consideration of  extraction of tooth numbers 2 and 31 at that time as these will be teeth in the primary field of radiation therapy. In the meantime, I will prescribe PreviDent 5000 Plus fluoride toothpaste to use at bedtime. Prescription was sent to [facility] pharmacy at his request. Impressions for fluoride trays and scatter protection devices will be considered if trismus symptoms and maximum interincisal opening resolve after the surgery. Referral to an oral surgeon will be made locally if the patient determines that he does not wish to proceed with surgical resection options. Consideration for referral to physical therapy for trismus exercises is suggested pending discussion with Dr. Isidore Moos.  2. Discussion of findings with medical team and  coordination of future medical and dental care as needed.  I spent in excess of 120 minutes during the conduct of this consultation and >50% of this time involved direct face-to-face encounter for counseling and/or coordination of the patient's care.    Lenn Cal, DDS

## 2014-06-09 NOTE — Patient Instructions (Addendum)

## 2014-06-14 ENCOUNTER — Encounter (HOSPITAL_COMMUNITY): Payer: Self-pay

## 2014-06-19 ENCOUNTER — Ambulatory Visit: Payer: Medicare Other

## 2014-06-19 ENCOUNTER — Encounter: Payer: Medicare Other | Admitting: Nutrition

## 2014-06-19 DIAGNOSIS — C062 Malignant neoplasm of retromolar area: Secondary | ICD-10-CM | POA: Diagnosis not present

## 2014-06-19 DIAGNOSIS — R1310 Dysphagia, unspecified: Secondary | ICD-10-CM

## 2014-06-19 NOTE — Therapy (Signed)
[facility] [address] [place], [place], [address] Phone: [phone]   Fax:  [phone]  Speech Language Pathology Evaluation  Patient Details  Name: Timothy Kim MRN: [id number] Date of Birth: 11-03-1929  Encounter Date: 06/19/2014      End of Session - 06/19/14 1540    Visit Number 1   Number of Visits 3   Date for SLP Re-Evaluation 08/19/14   SLP Start Time 1322   SLP Stop Time  1404   SLP Time Calculation (min) 42 min      Past Medical History  Diagnosis Date  . ALLERGIC RHINITIS 02/09/2008    Qualifier: Diagnosis of  By: Burnice Logan  MD, [place] HYPERTROPHY 03/03/2007    Qualifier: Diagnosis of  By: Rogue Bussing CMA, Maryann Alar    . DIABETES MELLITUS, TYPE II 03/03/2007    Qualifier: Diagnosis of  By: Rogue Bussing CMA, Maryann Alar    . GERD 03/03/2007    Qualifier: Diagnosis of  By: Rogue Bussing CMA, Maryann Alar    . HYPERTENSION 03/03/2007    Qualifier: Diagnosis of  By: Rogue Bussing CMA, Maryann Alar    . LOW BACK PAIN 06/09/2007    Qualifier: Diagnosis of  By: Burnice Logan  MD, Doretha Sou   . 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 OSTEOARTHRITIS 06/09/2007    Qualifier: Diagnosis of  By: Burnice Logan  MD, Doretha Sou   . Glaucoma   . CLL (chronic lymphocytic leukemia)     Stage 1  . Tonsillar cancer     Right Tonsil    Past Surgical History  Procedure Laterality Date  . Hernia repair      inguinal  . Rotator cuff repair    . Laminectomy    . Retinal detachment surgery    . Cataract extraction      There were no vitals taken for this visit.  Visit Diagnosis: Dysphagia   Pt currently tolerates regular diet/thin liquids with difficulty with larger egg-shaped supplements. SLP provided suggestions including warm H2O prior to administration as well as warm water swallows with the supplement to assist in transition through esophagus. Oral motor  assessment revealed WNL lingual ROM and WNL lingual strength. Labial ROM was WNL and strength was WNL. Velar ROM appeared WNL. POs: Pt ate cheese slice and drank E9F without overt s/s aspiration. Thyroid elevation appeared adequate, and swallows appeared timely. Oral residue noted as WNL. Pt's swallow deemed WNL at this time.   Because data states the risk for dysphagia during and after radiation treatment is high due to undergoing radiation tx, SLP taught pt about the possibility of reduced/limited ability for PO intake during rad tx. SLP encouraged pt to continue swallowing POs as far into rad tx as possible, even ingesting POs and/or completing HEP shortly after administration of pain meds.   SLP educated pt re: changes to swallowing musculature after rad tx, and why adherence to dysphagia HEP provided today and PO consumption was necessary to inhibit muscular disuse atrophy and to reduce muscle fibrosis following rad tx. Pt demonstrated understanding of these things to SLP. Further education was provided re: xerostomia, mucositis/esophagitis, and late effects head/neck radiation.   After eval tasks, SLP then developed a HEP for pt and pt was instructed how to perform exercises involving lingual, vocal, and pharyngeal strengthening. SLP performed each exercise and pt return demonstrated each exercise. SLP ensured pt performance was correct prior to moving  on to next exercise. Pt was instructed to complete this program 2-3 times a day, 6-7 days/week until 60 days after their last rad tx, then x3 a week after that.      Plan - 21-Jun-2014 1541    Clinical Impression Statement Pt presents with swallowing appears WNL at this time, however data suggests pt will have swallowing diffiuclty during and after rad tx. Skilled ST needed to assess pt's ability to maintain proper HEP procedure over time and to assess safety for POs throughout and after rad tx.   Speech Therapy Frequency --  two therapy visits approx  4 weeks apart   Duration --  in 60 days   Treatment/Interventions Pharyngeal strengthening exercises;Oral motor exercises;Patient/family education;SLP instruction and feedback   Potential to Achieve Goals Good   SLP Home Exercise Plan HEP provided today   Consulted and Agree with Plan of Care Patient     Short term goals (STG) STG1) Pt will perform HEP with rare min A New 1 visit  STG 2) Pt will tell SLP why he is completing HEP  New 1 visit  Long Term Goals (LTG) LTG1) pt will complete HEP with modified independence New 2 visits  LTG 2) pt will tell SLP why keeping a food journal can assist in transition to full POs New 2 visits  LTG 3) pt will tell SLP 3 s/s aspiration PNA New 2 visits     G-Codes - 2014/06/21 1603    Functional Assessment Tool Used noms   Functional Limitations Swallowing   Swallow Current Status (R6789) 0 percent impaired, limited or restricted   Swallow Goal Status (F8101) At least 1 percent but less than 20 percent impaired, limited or restricted      Problem List Patient Active Problem List   Diagnosis Date Noted  . 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 Abnormal weight loss 05/31/2014  . Hemoptysis 05/31/2014  . Cancer of the lip, oral cavity, and pharynx 05/30/2014  . CLL (chronic lymphocytic leukemia) 07/07/2012  . ALLERGIC RHINITIS 02/09/2008  . 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 OSTEOARTHRITIS 06/09/2007  . LOW BACK PAIN 06/09/2007  . DIABETES MELLITUS, TYPE II 03/03/2007  . HYPERTENSION 03/03/2007  . GERD 03/03/2007  . BENIGN PROSTATIC HYPERTROPHY 03/03/2007    Garald Balding, SLP 06/21/2014, 4:03 PM  [facility] [address] [place], [place], [address] Phone: [phone]   Fax:  [phone]

## 2014-06-19 NOTE — Patient Instructions (Signed)
   SWALLOWING EXERCISES Do these once a week until you start rad tx, then complete 6 of the 7 days per week until 6 months until your last radiation day, then 3 times per week afterwards  1. Effortful Swallows - Squeeze hard with the muscles in your neck while you swallow your  saliva or a sip of water - Repeat 20 times, 2-3 times a day, and use whenever you eat or drink  2. Masako Swallow - swallow with your tongue sticking out - Stick tongue out past your teeth and gently bite tongue with your teeth - Swallow, while holding your tongue with your teeth - Repeat 20 times, 2-3 times a day *use a wet spoon if your mouth gets dry*  3. Pitch Raise - Repeat "he", once per second in as high of a pitch as you can - Repeat 20 times, 2-3 times a day  4. Mendelsohn Maneuver - "half swallow" exercise - Start to swallow, and keep your Adam's apple up by squeezing hard with the muscles of the throat - Hold the squeeze for 5-7 seconds and then relax - Repeat 20 times, 2-3 times a day *use a wet spoon if your mouth gets dry*  5. Tongue Press - Press your entire tongue as hard as you can against the roof of your mouth for 3-5 seconds - Repeat 20 times, 2-3 times a day  6. Breath Hold - Say "HUH!" loudly, then hold your breath for 3 seconds at your voice box - Repeat 20 times, 2-3 times a day  7. Chin pushback - Open your mouth  - Place your fist UNDER your chin near your neck, and push back with your fist for 5 seconds - Repeat 8 times, 2-3 times a day

## 2014-06-21 ENCOUNTER — Telehealth: Payer: Self-pay | Admitting: *Deleted

## 2014-06-21 NOTE — Telephone Encounter (Signed)
Called patient to check on his well-being and status of his appt at [facility].  He reported "I'm doing fine", has an appt at [facility] next Wednesday, 06/28/14.  I indicated I would follow-up with him next week.  Gayleen Orem, RN, BSN, [place] at [place] [phone]

## 2014-06-26 DIAGNOSIS — C099 Malignant neoplasm of tonsil, unspecified: Secondary | ICD-10-CM | POA: Diagnosis not present

## 2014-06-28 ENCOUNTER — Telehealth: Payer: Self-pay | Admitting: Internal Medicine

## 2014-06-28 DIAGNOSIS — F1010 Alcohol abuse, uncomplicated: Secondary | ICD-10-CM | POA: Insufficient documentation

## 2014-06-28 DIAGNOSIS — C062 Malignant neoplasm of retromolar area: Secondary | ICD-10-CM | POA: Diagnosis not present

## 2014-06-28 NOTE — Telephone Encounter (Signed)
Belenda Cruise would like for Dr. Raliegh Ip to call Dr. Caryl Never at [facility], [phone] to offer input on the patient being an alcoholic.  She said he has an appointment today at 2:30 pm.

## 2014-06-29 ENCOUNTER — Telehealth: Payer: Self-pay | Admitting: *Deleted

## 2014-06-29 DIAGNOSIS — H4011X3 Primary open-angle glaucoma, severe stage: Secondary | ICD-10-CM | POA: Diagnosis not present

## 2014-06-29 NOTE — Telephone Encounter (Addendum)
Called patient for update after yesterday's appt at [facility].  He reported that he met with Dr. Fenton Malling, surgery has been scheduled for 07/21/14.  I informed Dr. Isidore Moos.  Gayleen Orem, RN, BSN, [place] at [place] [phone]

## 2014-07-18 ENCOUNTER — Other Ambulatory Visit: Payer: Medicare Other

## 2014-07-18 ENCOUNTER — Other Ambulatory Visit: Payer: Self-pay | Admitting: Hematology and Oncology

## 2014-07-18 ENCOUNTER — Encounter: Payer: Self-pay | Admitting: *Deleted

## 2014-07-18 ENCOUNTER — Telehealth: Payer: Self-pay | Admitting: Hematology and Oncology

## 2014-07-18 ENCOUNTER — Ambulatory Visit: Payer: Medicare Other | Admitting: Hematology and Oncology

## 2014-07-18 DIAGNOSIS — C9110 Chronic lymphocytic leukemia of B-cell type not having achieved remission: Secondary | ICD-10-CM

## 2014-07-18 NOTE — Progress Notes (Signed)
Pt showed up for appt w/ Dr. Alvy Bimler today.  This appt had been canceled but pt states he was not made aware of it.   Pt is scheduled for Head and Neck surgery at [facility] later this week 1/29.  Per Dr. Alvy Bimler,  Pt does not need to return to [place] for 3 months.  Request sent to scheduler.  Informed pt he will be called w/ appt.Marland Kitchen  Apologized to him that he came in today for appt that had been canceled..  Pt agreed he did not need to be seen by Dr. Alvy Bimler today.  He does not have any new problems or concerns and understands he will be seen after he recovers from surgery.   Instructed him to call if he feels he needs to be seen sooner than 3 months..  He verbalized understanding.

## 2014-07-18 NOTE — Telephone Encounter (Signed)
lvm for pt regarding to April appt.....mailed pt appt sched/letter

## 2014-07-19 DIAGNOSIS — I493 Ventricular premature depolarization: Secondary | ICD-10-CM | POA: Diagnosis not present

## 2014-07-19 DIAGNOSIS — Z789 Other specified health status: Secondary | ICD-10-CM | POA: Insufficient documentation

## 2014-07-19 DIAGNOSIS — Z01810 Encounter for preprocedural cardiovascular examination: Secondary | ICD-10-CM | POA: Diagnosis not present

## 2014-07-19 DIAGNOSIS — F1090 Alcohol use, unspecified, uncomplicated: Secondary | ICD-10-CM | POA: Insufficient documentation

## 2014-07-19 DIAGNOSIS — Z9189 Other specified personal risk factors, not elsewhere classified: Secondary | ICD-10-CM

## 2014-07-19 DIAGNOSIS — Z01818 Encounter for other preprocedural examination: Secondary | ICD-10-CM | POA: Insufficient documentation

## 2014-07-19 DIAGNOSIS — Z419 Encounter for procedure for purposes other than remedying health state, unspecified: Secondary | ICD-10-CM | POA: Insufficient documentation

## 2014-07-21 DIAGNOSIS — C770 Secondary and unspecified malignant neoplasm of lymph nodes of head, face and neck: Secondary | ICD-10-CM | POA: Diagnosis not present

## 2014-07-21 DIAGNOSIS — C062 Malignant neoplasm of retromolar area: Secondary | ICD-10-CM | POA: Diagnosis not present

## 2014-07-24 ENCOUNTER — Ambulatory Visit: Payer: Self-pay

## 2014-07-25 DIAGNOSIS — C062 Malignant neoplasm of retromolar area: Secondary | ICD-10-CM | POA: Diagnosis not present

## 2014-07-25 DIAGNOSIS — M7989 Other specified soft tissue disorders: Secondary | ICD-10-CM | POA: Diagnosis not present

## 2014-07-28 ENCOUNTER — Telehealth: Payer: Self-pay | Admitting: *Deleted

## 2014-07-28 NOTE — Telephone Encounter (Signed)
Called patient to check on his well-being s/p his 1/29 surgery.  LVM asking him to return my call.  Gayleen Orem, RN, BSN, [place] at [place] [phone]

## 2014-07-31 ENCOUNTER — Telehealth: Payer: Self-pay | Admitting: *Deleted

## 2014-07-31 NOTE — Telephone Encounter (Signed)
Called patient.  He reports that he was discharged from [facility] yesterday.  Scheduling of a follow-up appt with Dr. Vicie Mutters is pending. I later spoke with Maudie Mercury, RN, at Dr. Waynard Edwards office, inquired about patient's post-surgical follow-up.  She is going to have [employer], PA, give me a call. Dr. Isidore Moos updated.  Gayleen Orem, RN, BSN, [place] at [place] [phone]

## 2014-08-02 ENCOUNTER — Telehealth: Payer: Self-pay | Admitting: *Deleted

## 2014-08-02 ENCOUNTER — Ambulatory Visit: Payer: Medicare Other | Admitting: Internal Medicine

## 2014-08-02 NOTE — Telephone Encounter (Signed)
Received VM from Biagio Borg, [facility].  She indicated patient has post-surgical appt with Dr. Vicie Mutters on 08/09/14.  She noted patient will be referred back to Dr. Isidore Moos in several weeks pending additional healing.  I notified Dr. Isidore Moos.  Gayleen Orem, RN, BSN, [place] at [place] [phone]

## 2014-08-08 ENCOUNTER — Other Ambulatory Visit: Payer: Self-pay | Admitting: Internal Medicine

## 2014-08-09 DIAGNOSIS — Z4802 Encounter for removal of sutures: Secondary | ICD-10-CM | POA: Diagnosis not present

## 2014-08-15 ENCOUNTER — Telehealth: Payer: Self-pay | Admitting: *Deleted

## 2014-08-15 NOTE — Progress Notes (Signed)
Head and Neck Cancer Location of Tumor / Histology: Right Tonsil: Invasive Squamous Cell Carcinoma  [name]: First initial presentation was due to difficulties with opening his mouth and swelling around his neck. He was prescribed antibiotics around August with no improvement. He was referred to see a dentist and was noted to have no dental issues. He was subsequently referred to see an oral surgeon. He complained of intermittent shooting pain sensation around the right ear and about 9 pound weight loss with associated ear ache. Subsequently, in September through November, he started to have recurrent hemoptysis. His last episode was in 05/15/2014.     Biopsies of (if applicable) revealed: Tonsil, Right 07/21/14  Trach, RSND, Maxillectomy with r rim mandibulectomy, dental extractions, right parascapular fasciocutaneous free flap reconstruction.   I have personally reviewed the slides and/or other related  materials referenced, and have edited the report as part of my  pathologic assessment and final interpretation.    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    FSB1"TONSIL, INK MARKS TRUE MARGIN":  Negative for malignancy.    FSC1"NASOPHARYNX":  Negative  for malignancy.    FSD1"BUCCAL MUCOSA":  Negative for malignancy.    FSE1"LATERAL PTERYGOID":  Negative for malignancy.    FSF1"MEDIAL PTERYGOID":  Positive for squamous cell carcinoma.      Timothy Kim, M.D., Pathology      Gross Description  A.Received labeled "soft palate, ink marks true margin" is a  1.6 x 0.9 x 0.4 cm yellow-tan soft tissue fragment submitted for  intraoperative frozen section evaluation.The remnant is  submitted in FSA1.    B.Received labeled "tonsil, ink marks true margin" is a 1.5 x  0.6 x 0.5 cm yellow-tan soft tissue fragment submitted for  intraoperative frozen section evaluation.The remnant is  submitted in FSB1.    C.Received labeled "nasopharynx" is a 1.8 x 0.5 x 0.2 cm  red-tan soft tissue fragment submitted for intraoperative frozen  section evaluation.The remnant is submitted in FSC1.    D.Received labeled "buccal mucosa" is a 1.6 x 0.5 x 0.4 cm  yellow-tan soft tissue fragment submitted for intraoperative  frozen section evaluation.The remnant is submitted in FSD1.    E.Received labeled "lateral pterygoid" is a 2.1 x 1.5 x 0.6 cm  red-tan soft tissue fragment submitted for intraoperative frozen  section evaluation.The remnant is submitted in FSE1.    F.Received labeled "medial pterygoid" are multiple red-tan soft  tissue fragments, 1.6 x 0.7 x 0.3 cm in aggregate.The specimen  is submitted for intraoperative frozen section evaluation, and  the remnants are submitted in FSF1.    G.Received labeled "right level 2, 3 neck contents" are two  fragments of fibroadipose tissue, 5 x 4.7 x 1.8 cm and 5 x 4  x  3.5 cm.Twenty-four lymph node candidates are identified, the  largest of which is a 1.5 cm in greatest dimension.Salivary  gland tissue is identified (3.1 cm in greatest dimension) which  is pale yellow and lobulated on cut  surface.    G1-25 lymph node candidates each cassette  G32 lymph nodes, both bisected, one inked black  G43 lymph nodes, each bisected, one inked black and one  inked blue  G52 lymph nodes, one bisected inked blue, the other  trisected  G63 lymph nodes, each bisected, one inked black and one  inked blue  G7One lymph node, bisected  G8One lymph node, trisected  G9Largest lymph node, trisected  G10Representative section of salivary gland    TimothyReceived labeled "inferior alveolar nerve" is a 1.3 x 0.3 x  0.5 cm red-tan soft tissue fragment.The specimen is entirely  submitted in H1.    I.Received labeled "lingual nerve" is a 1.5 x 0.3 x 0.3 cm  red-tan soft tissue fragment.The specimen is entirely submitted  in I1.    J.Received labeled "deep medial pterygoid margin" are two  red-tan soft tissue fragments, 1.8 x 1.5 x 0.7 cm in aggregate.  The larger fragment is bisected and the specimen is entirely  submitted in J1.    K.Received labeled "medial pterygoid" are four friable  red-brown soft tissue fragments with glistening white tendinous  segments.The specimen is entirely submitted in K1-2.    L.Received labeled "right palate and mandible" is a 6.7 x 4.7 x  4 cm palatal resection specimen consisting of teeth, mucosa,  mandible (6.7 cm in length), an adipose tissue fragment, and  associated soft tissue.A 2.1 x 1.2 cm ulcerated lesion is on  the mucosal surface.The specimen is inked black.The cut  surface reveals a tan-white, firm, fibrotic lesion, 4 x 3.3 x 2  cm deep to the area of ulceration.The lesion is 1.4 cm from the  anterior margin, 0.4 cm from the posterior margin, 0.4 cm from  the lateral margin, 0.7 cm from the medial margin, and abuts the  deep margin.    L1Anterior bone and soft tissue margin, en face  L2-3Posterior bone and soft tissue margin,  radial  L4-5Lateral bone and soft tissue margin, radial  L6-7Medial soft tissue margin, radial  L8-9Deep soft tissue margin, radial  L10-11Lesion in relation to overlying mucosal ulceration    M.Received labeled "teeth ID only" is a specimen received for  gross examination only consisting of two teeth and a grey  metallic cap, 2.0 x 1.3 x 1.2 cm in aggregate.The specimen has  been reviewed by the attending pathologist    Timothy Kim ,  INVASIVE SQUAMOUS CELL CARCINOMA  Nutrition Status Yes No Comments  Weight changes? [X]  [ ]  Weight 166 on 05/2014  Swallowing concerns? [ ]  [x ]   PEG? [x ] [ ]  placed 07/21/14   Referrals Yes No Comments  Social Work? [ ]  [ x]   Dentistry? [X]  [ ]  12 /18/15 last seen, no FU appt scheduled  Swallowing therapy? [x]  [ ]  08/28/14 Garald Balding  Nutrition? [X]  [ ]  No FU appt scheduled, last seen 06/09/14  Med/Onc? [x ] [ ]  10/17/14 - FU with Dr Ernst [facility]   Safety Issues Yes No Comments  Prior radiation? [ ]  [X]    Pacemaker/ICD? [ ]  [X]    Possible current pregnancy? [ ]  [ x] N/A  Is the patient on methotrexate? [ ]  [X]     Tobacco/Marijuana/Snuff/ETOH use: Never smoker, hx 3-4 shots of  Gin daily, No Illicit Drug Use  Past/Anticipated interventions by otolaryngology, if any: Dr. Jodi Marble- 84/6/65  Biopsy of Right Tonsil 07/21/14 07/21/14  Trach, RSND, Maxillectomy with R rim mandibulectomy, dental extractions, right parascapular fasciocutaneous free flap reconstruction. [facility] [place]:  Dr Jeri Lager  Past/Anticipated interventions by medical oncology, if any: Dr. 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 He states he lost 9 lbs in hospital but has gained back 4 lbs. He chews  only on left side of mouth. He states he "has  extra tissue in right side of his mouth". He has trach site which is healing; he cleanses daily with Hydrogen Peroxide, puts Neosporin around stoma. He states "it has some drainage".  Patient states Dr Vicie Mutters informed him that he was going to place a metal plate in his cheek, but patient states he didn't confirm that with Dr Vicie Mutters. No FU scheduled with Dr Wyonia Hough, [facility] He states his graft site on his back has healed.

## 2014-08-15 NOTE — Telephone Encounter (Signed)
Spoke with patient to inform him of upcoming appointments with Dr Isidore Moos, including information re: IV contrast. Patient states that he is not diabetic, but that his primary care physician "saw some things that made him say that". Patient is aware he may eat/drink and was encouraged to drinks fluids the morning of his ct sim appointment. Patient states he does not have a porta cath in place.  Informed patient of appointment with Garald Balding; he states he will be out of town. Gave him phone number for C Schinke.  Pt expressed appreciation for this call.

## 2014-08-21 ENCOUNTER — Ambulatory Visit: Payer: Medicare Other

## 2014-08-21 ENCOUNTER — Ambulatory Visit: Payer: Self-pay

## 2014-08-22 ENCOUNTER — Encounter: Payer: Self-pay | Admitting: Radiation Oncology

## 2014-08-23 ENCOUNTER — Encounter: Payer: Self-pay | Admitting: *Deleted

## 2014-08-23 ENCOUNTER — Ambulatory Visit
Admission: RE | Admit: 2014-08-23 | Discharge: 2014-08-23 | Disposition: A | Discharge status: Home / Self Care | Payer: Medicare Other | Source: Ambulatory Visit | Attending: Radiation Oncology | Admitting: Radiation Oncology

## 2014-08-23 ENCOUNTER — Encounter: Payer: Self-pay | Admitting: Radiation Oncology

## 2014-08-23 VITALS — BP 127/73 | HR 78 | Temp 98.6°F | Resp 20 | Ht 65.0 in | Wt 162.9 lb

## 2014-08-23 DIAGNOSIS — C062 Malignant neoplasm of retromolar area: Secondary | ICD-10-CM

## 2014-08-23 DIAGNOSIS — C148 Malignant neoplasm of overlapping sites of lip, oral cavity and pharynx: Secondary | ICD-10-CM

## 2014-08-23 MED ORDER — SODIUM CHLORIDE 0.9 % IJ SOLN
10.0000 mL | Freq: Once | INTRAMUSCULAR | Status: AC
  Administered 2014-08-23: 10 mL via INTRAVENOUS

## 2014-08-23 NOTE — Progress Notes (Signed)
Right AC IV blew. Catheter intact upon removal. Applied an occlusive dressing to old IV site. Inserted right forearm 22 gauge IV on the first attempt. Excellent blood return obtained. Patient tolerated well.

## 2014-08-23 NOTE — Progress Notes (Signed)
Simulation complete. Removed right forearm 22 gauge IV. Catheter intact upon removal. Applied a bandaid to old IV site. Patient tolerated well.

## 2014-08-23 NOTE — Progress Notes (Addendum)
Simulation, IMRT treatment planning, and Special treatment procedure note   Outpatient  Diagnosis:    ICD-9-CM ICD-10-CM   1. Squamous cell carcinoma of retromolar trigone 145.6 C06.2      The patient was taken to the CT simulator and laid in the supine position on the table. An Aquaplast head and shoulder mask was custom fitted to the patient's anatomy. High-resolution CT axial imaging was obtained of the head and neck with contrast. I verified that the quality of the imaging is good for treatment planning. 1 Medically Necessary Treatment Device was fabricated and supervised by me: Aquaplast mask.   Treatment planning note I plan to treat the patient with helical Tomotherapy, IMRT. I plan to treat the patient's tumor and ispilateral neck nodes. Due to close margins, and the fact that negative margins can't be determined with certainty, I considered treating to a total dose of 66 Gray in 33 Fractions.  But, I realized that without orientation of a specific positive margin, this would require me to treat a large amount of tissue to 66 Gy.  I will thus treat the tumor bed to 60 Gy in 30 fractions with margin, instead. I will treat the right neck nodes. Dose calculation was ordered from dosimetry.  IMRT planning Note  IMRT is an important modality to deliver adequate dose to the patient's at risk tissues while sparing the patient's normal structures, including the: esophagus, parotid tissue, mandible, brain stem, spinal cord, oral cavity, brachial plexus.  This justifies the use of IMRT in the patient's treatment.     -----------------------------------  Eppie Gibson, MD

## 2014-08-23 NOTE — Progress Notes (Signed)
BMP from [facility] done 2/4 proving BUN to be 15 and creatinine 0.53; both within defined limits. Denies having diabetes or taking metformin. Denies having a port. Denies history of radiation therapy. Denies allergy to contrast dye. Started right AC 22 gauge IV on the first attempt. Excellent blood return. Flushed without complication. Patient tolerated well.

## 2014-08-23 NOTE — Progress Notes (Signed)
Radiation Oncology         [phone] ________________________________  Name: Timothy Kim MRN: [id number]  Date: 08/23/2014  DOB: 16-Nov-1929  Follow-Up Visit Note  Outpatient  CC: [name], MD  Philomena Doheny, MD  Diagnosis and Prior Radiotherapy:    ICD-9-CM ICD-10-CM   1. Squamous cell carcinoma of retromolar trigone 145.6 C06.2     Squamous cell carcinoma of retromolar trigone   Staging form: Lip and Oral Cavity, AJCC 7th Edition     Clinical: Stage IVA (T4a, N0, M0) - Unsigned     Pathologic: Stage IVB (T4b, N0, cM0) - Signed by Eppie Gibson, MD on 08/23/2014  STAGE IVB pT4b pN0 Grade 2 Squamous cell carcinoma, Right Retromolar Trigone; +PNI, no LVSI  Narrative:  The patient returns today for routine follow-up.  Dr. Vicie Mutters performed his resection on 07-24-14 including Tracheotomy, R SND (levels 1-3), right infrastructure maxillectomy with right rim mandibulectomy, dental extractions, right parascapular fasciocutaneous free flap. The tumor from the R retromolar trigone (grade II squamous cell carcinoma) was 4.0cm with PNI but no LVSI. 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     Margin status was unclear by the path report.  I spoke with Dr. Vicie Mutters. The tumor was large, and he judges the margins to be close after re-excisions of tissue, but it is hard to verify margin negativity with certainty.    He is on a soft / liquid diet.  His flap has healed nicely and he is released for RT planning.  He quit ETOH before his surgery and is doing well with this, he reports.                   PATHOLOGY: ACCESSION NUMBER: [id number] RECEIVED: 07/24/2014 ORDERING PHYSICIAN: Marijo Conception , MD PATIENT NAME: Timothy, Kennedy Kim SURGICAL PATHOLOGY REPORT  FINAL PATHOLOGIC DIAGNOSIS MICROSCOPIC EXAMINATION AND DIAGNOSIS  L. RIGHT PALATE AND MANDIBLE, RESECTION: Invasive squamous cell carcinoma. - Specimen  size: 6.7 cm. - Specimen laterality: Right. - Tumor site: Right retromolar trigone. - Tumor focality: Unifocal. - Tumor size: 4.0 cm (greatest gross dimension). - Histologic type: Squamous cell carcinoma, conventional type. - Histologic grade: Moderately differentiated (grade 2). - Microscopic tumor extension: Carcinoma invades through bone. - Lymphovascular invasion: Not identified. - Perineural invasion: Present. - Lymph nodes: Small lymphocytic lymphoma is present; negative for carcinoma (see below). - Margins: Positive for carcinoma (deep margin in part L); see comment below. - Pathologic stage: pT4b pN0.  COMMENT: The margin in specimen L is positive for carcinoma (deep margin). Additional margins were submitted separately (see below). Surgical correlation is necessary in order to determine the true final margin status.  Note: The protocol above is based on AJCC/UICC TNM, 7th edition (CAP protocol web posting date is October 2013).  G. RIGHT LEVEL 2, 3 NECK CONTENTS, EXCISION: Small lymphocytic lymphoma. Twenty two lymph nodes are negative for carcinoma (0/22). See comment.  COMMENT: Many of the lymph nodes are diffusely effaced by a monomorphic population of small lymphocytes, consistent with lymphoma. Immunoperoxidase studies are performed. The small lymphocytes are immunoreactive for the B-cell marker CD20, which represents the vast majority of the lymphocytes within the lymph nodes. CD3 reveals a minor population of T-cells in the background. The B-cells co-express CD5 and CD23, and are negative for CD10 and cyclin-D1. The Ki67 labeling index is 5%. 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 This is typically seen together with chronic lymphocytic leukemia (CLL). Clinical correlation is recommended.   A. SOFT PALATE, EXCISION: Negative for carcinoma.  B. TONSIL, EXCISION: Moderate to severe  squamous epithelial dysplasia is present on the permanent sections. Inked edge is free of dysplasia.  C. NASOPHARYNX, EXCISIONAL BIOPSY: Negative for carcinoma.  D. BUCCAL MUCOSA, EXCISIONAL BIOPSY: Negative for carcinoma.  E. LATERAL PTERYGOID, EXCISIONAL BIOPSY: Negative for carcinoma.  F. MEDIAL PTERYGOID, EXCISIONAL BIOPSY: Invasive squamous cell carcinoma.  H. INFERIOR ALVEOLAR NERVE, BIOPSY: Negative for carcinoma.  I. LINGUAL NERVE, BIOPSY: Negative for carcinoma.  J. DEEP MEDIAL PTERYGOID MARGIN, EXCISIONAL BIOPSY: Negative for carcinoma.  K. MEDIAL PTERYGOID, EXCISIONAL BIOPSY: Negative for carcinoma.  M. TEETH, EXCISION: Teeth (gross examination only).   "These tests were developed and their performance characteristics determined by [facility], [facility]. They have not been cleared or approved by the U.S. Food and Drug Administration. The FDA has determined that such clearance or approval is not necessary. These tests are used for clinical purposes. They should not be regarded as investigational or for research. This laboratory is certified under the [place] (CLIA) as qualified to perform high complexity clinical laboratory testing." All controls worked appropriately.    I have personally reviewed the slides and/or other related materials referenced, and have edited the report as part of my pathologic assessment and final interpretation.  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  FSB1 "TONSIL, INK MARKS TRUE MARGIN": Negative for malignancy.  FSC1 "NASOPHARYNX": Negative for malignancy.  FSD1 "BUCCAL MUCOSA": Negative for malignancy.  FSE1 "LATERAL PTERYGOID": Negative for malignancy.  FSF1 "MEDIAL PTERYGOID": Positive for squamous cell carcinoma.   Jasmine December, M.D., Pathology     ALLERGIES:  has No Known Allergies.  Meds: Current Outpatient Prescriptions  Medication Sig Dispense Refill  . aspirin 325 MG tablet Take 325 mg by mouth.    . benazepril-hydrochlorthiazide (LOTENSIN HCT) 20-12.5 MG per tablet TAKE 1 TABLET EVERY DAY 90 tablet 3  . brimonidine-timolol (COMBIGAN) 0.2-0.5 % ophthalmic solution Apply 1 drop to eye.    Marland Kitchen DHEA 50 MG TABS Take 1 tablet by mouth daily.    Javier Docker Oil 300 MG CAPS Take 300 mg by mouth daily.    Marland Kitchen latanoprost (XALATAN) 0.005 % ophthalmic solution   4  . Lutein-Zeaxanthin 15-0.7 MG CAPS Take 1 tablet by mouth daily.    . metoprolol succinate (TOPROL-XL) 100 MG 24 hr tablet TAKE 1 TABLET IMMEDIATELY FOLLOWING A MEAL 90 tablet 3  . sodium chloride (OCEAN) 0.65 % nasal spray Place 2 sprays into the nose.    . sodium fluoride (PREVIDENT 5000 PLUS) 1.1 % CREA dental cream Apply thin ribbon of cream to tooth brush. Brush teeth for 2 minutes. Spit out excess. Repeat nightly. 1 Tube prn  . tadalafil (CIALIS) 20  MG tablet Take 1 tablet (20 mg total) by mouth daily as needed for erectile dysfunction. 10 tablet 3  . triamcinolone cream (KENALOG) 0.1 % APPLY TO AFFECTED AREA TWICE DAILY 80 g 1  . zolpidem (AMBIEN) 5 MG tablet TAKE 1 TABLET AT BEDTIME AS NEEDED FOR SLEEP 30 tablet 2  . sildenafil (VIAGRA) 100 MG tablet Take 1 tablet (100 mg total) by mouth as needed for erectile  dysfunction. (Patient not taking: Reported on 06/07/2014) 3 tablet 6   No current facility-administered medications for this encounter.    Physical Findings: The patient is in no acute distress. Patient is alert and oriented.  height is $RemoveB'5\' 5"'mvTeiXps$  (1.651 m) and weight is 162 lb 14.4 oz (73.891 kg). His oral temperature is 98.6 F (37 C). His blood pressure is 127/73 and his pulse is 78. His respiration is 20. Marland Kitchen    Oropharyngeal mucosa is somewhat dry, with well healed flap reconstruction in right oral cavity, and no thrush or recurrent lesions. No palpable cervical or supraclavicular lymphadenopathy. Postoperative swelling in right face, external scars are well healed.   ECOG PS 1  Lab Findings: [place]:    [place]: Lab Results  Component Value Date   TSH 1.315 06/07/2014    Radiographic Findings: No results found.  Impression/Plan:  This is a delightful 81 year old man with post operative pT4bN0cM0 squamous cell carcinoma of the right retromolar trigone;  Positive prior ETOH use /  Negative tobacco/smoking history.   Plan is as below:   1) The patient has met with med/onc to discuss chemotherapy.  I will not refer back for chemotherapy given that margins are felt to be negative, and nodes were negative.   2) s/p [facility] dental evaluation/extractions; he is at risk trismus issues in future.  In preparation for radiation in the vicinity of the mouth I will re-refer to Dr. Enrique Sack  . Patient may benefit from oral hygiene, trismus education with Dr. Raliegh Ip (did not receive this at [facility]).  3) Has been referred to  Polo Riley from social work for social support  4) Will refer back to nutrition for nutrition support. No PEG.  5) will follow with swallowing therapy for prophylactic treatment as needed for dysphagia, which can worsen during or after chemoradiotherapy.   6) Physical therapist has seen patient [facility] for neck measurements due to risk of lymphedema in neck; may benefit  from PT for this after completion of radiotherapy. The patient also may benefit from PT for potentional deconditioning after treatment    7) Simulation today. Anticipate 6-6.5 weeks of RT; I may treat tumor bed to 66Gy due to questionable margins.  8) Baseline TSH lab due to risk of hypothyroidism from RT was normal  9) The patient had a h/o  ETOH -- 3-4 drinks daily. The patient was counseled to stop ETOH as this may have caused his cancer and was offered support/ referrals to community resources to help with this if needed. He has quit, and I applauded him today for this.  It was a pleasure meeting the patient today. We discussed the risks, benefits, and side effects of radiotherapy.   We talked in detail about acute and late effects. He understands that some of the most bothersome acute effects will be significant soreness of the mouth and throat, changes in taste, changes in salivary function, skin irritation, hair loss, dehydration, weight loss and fatigue. We talked about late effects which include but are not necessarily limited to dysphagia, hypothyroidism, dry  mouth, trismus, neck edema and nerve or spinal cord injury. No guarantees of treatment were given. A consent form has been signed and placed in the patient's medical record. The patient is enthusiastic about proceeding with treatment. I look forward to participating in the patient's care.  I spent 25 minutes face to face with the patient and more than 50% of that time was spent in counseling and/or coordination of care. _____________________________________   Eppie Gibson, MD

## 2014-08-23 NOTE — Progress Notes (Signed)
Please see the Nurse Progress Note in the MD Initial Consult Encounter for this patient. 

## 2014-08-23 NOTE — Progress Notes (Signed)
To provide support and encouragement, care continuity and to assess for needs, met with patient during f/u appt with Dr. Isidore Moos, supported him during CT SIM. 1. Patient verbalized understanding of RT plan. 2. He did not express any needs or concerns at this time, I encouraged him to contact me if that changes, he verbalized understanding.  Gayleen Orem, RN, BSN, [facility] [place] [phone]

## 2014-08-25 ENCOUNTER — Ambulatory Visit (HOSPITAL_COMMUNITY): Payer: Self-pay | Admitting: Dentistry

## 2014-08-25 VITALS — BP 121/65 | HR 78 | Temp 97.7°F

## 2014-08-25 DIAGNOSIS — K08409 Partial loss of teeth, unspecified cause, unspecified class: Secondary | ICD-10-CM

## 2014-08-25 DIAGNOSIS — Z01818 Encounter for other preprocedural examination: Secondary | ICD-10-CM

## 2014-08-25 DIAGNOSIS — C062 Malignant neoplasm of retromolar area: Secondary | ICD-10-CM

## 2014-08-25 DIAGNOSIS — M264 Malocclusion, unspecified: Secondary | ICD-10-CM

## 2014-08-25 DIAGNOSIS — R252 Cramp and spasm: Secondary | ICD-10-CM

## 2014-08-25 NOTE — Progress Notes (Signed)
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 Patient underwent surgical resection with Dr. Ardath Sax at [facility] on 07/21/14. Patient underwent right maxillectomy, right marginal mandibulectomy, radical neck dissection, and reconstructive flap surgery at that time. Patient now with anticipated postoperative radiation therapy with Dr. Isidore Moos. Patient is now seen to discuss oral hygiene regimen, use of fluoride, and use of trismus exercises.  Exam: General: Patient is a well-developed, well-nourished male in no acute distress. Head and neck exam: The right neck is consistent with flap reconstruction and radical neck dissection and surgical resection on 07/21/2014. Intraoral exam: Patient with relatively normal saliva. Maximum interincisal opening is now measured at 40 mm versus the preoperative 18 mm. Trismus exercises were provided. Intraoral exam is consistent with a right maxillectomy, right marginal mandibulectomy, and flap reconstruction. Dentition: Patient had tooth numbers 2, 3, 4, and 31 removed in the surgical resection by Dr. Vicie Mutters. All remaining teeth appear to be stable and without dental caries. Periodontal: Patient with minimal plaque accumulations. Oral hygiene instructions were provided with use of fluoride at bedtime with the PreviDent 5000 Plus. Endodontic: Patient currently denies acute pulpitis symptoms.  Crown and bridge: Crown restoration on tooth #5 appears to be acceptable. Occlusion: Patient has anterior crossbite as before surgery. Patient does have a malocclusion. Radiographic interpretation: An orthopantogram was obtained. Surgical defect involving the upper right maxilla, lower right mandible are noted with loss of partial  mandible and coronoid process. Multiple surgical clips are noted. Right and left condylar heads appear to be well seated. Patient is now missing additional tooth numbers 2, 3, and 4 as well as #31 from previous radiographic taken on 06/09/2014.  Assessment: 1. Squamous cell carcinoma right retromolar trigone status post surgical resection and flap reconstruction with Dr. Ardath Sax on 07/21/2014. 2. Anticipated postoperative radiation therapy with Dr. Isidore Moos 3. Chronic periodontitis with bone loss 4. Accretions-minimal 5. Malocclusion   Plan/recommendations: 1. Brush teeth after meals and at bedtime. 2. Use PreviDent 5000 Plus fluoride toothpaste at bedtime.  3. Trismus exercises twice daily as directed.  4. Return to clinic in dental medicine approximately one month after the last radiation therapy. 5. Call if problems arise before then.   Lenn Cal, DDS

## 2014-08-25 NOTE — Addendum Note (Signed)
Encounter addended by: Eppie Gibson, MD on: 08/25/2014  3:58 PM<BR>     Documentation filed: Notes Section

## 2014-08-25 NOTE — Patient Instructions (Signed)
TRISMUS  Trismus is a condition where the jaw does not allow the mouth to open as wide as it usually does.  This can happen almost suddenly, or in other cases the process is so slow, it is hard to notice it-until it is too far along.  When the jaw joints and/or muscles have been exposed to radiation treatments, the onset of Trismus is very slow.  This is because the muscles are losing their stretching ability over a long period of time, as long as 2 YEARS after the end of radiation.  It is therefore important to exercise these muscles and joints.  TRISMUS EXERCISES   Stack of tongue depressors measuring the same or a little less than the last documented MIO (Maximum Interincisal Opening).  Secure them with a rubber band on both ends.  Place the stack in the patient's mouth, supporting the other end.  Allow 30 seconds for muscle stretching.  Rest for a few seconds.  Repeat 3-5 times  For all radiation patients, this exercise is recommended in the mornings and evenings unless otherwise instructed.  The exercise should be done for a period of 2 YEARS after the end of radiation.  MIO should be checked routinely on recall dental visits by the general dentist or the hospital dentist.  The patient is advised to report any changes, soreness, or difficulties encountered when doing the exercises.

## 2014-08-28 ENCOUNTER — Ambulatory Visit: Payer: Medicare Other | Attending: Radiation Oncology

## 2014-08-28 ENCOUNTER — Ambulatory Visit: Payer: Medicare Other

## 2014-08-28 DIAGNOSIS — K219 Gastro-esophageal reflux disease without esophagitis: Secondary | ICD-10-CM | POA: Insufficient documentation

## 2014-08-28 DIAGNOSIS — E119 Type 2 diabetes mellitus without complications: Secondary | ICD-10-CM | POA: Insufficient documentation

## 2014-08-28 DIAGNOSIS — R1310 Dysphagia, unspecified: Secondary | ICD-10-CM

## 2014-08-28 DIAGNOSIS — R1311 Dysphagia, oral phase: Secondary | ICD-10-CM | POA: Insufficient documentation

## 2014-08-28 DIAGNOSIS — C062 Malignant neoplasm of retromolar area: Secondary | ICD-10-CM | POA: Diagnosis not present

## 2014-08-28 DIAGNOSIS — I10 Essential (primary) hypertension: Secondary | ICD-10-CM | POA: Diagnosis not present

## 2014-08-28 NOTE — Patient Instructions (Signed)
  Alternate bite - sip with meals, to clear oral cavity of solids during that time  Perform oral care following each meal, or snack  Drink liqiuids from left side to minimize spillage

## 2014-08-28 NOTE — Therapy (Signed)
[facility] [address] [place], [place], [address] Phone: [phone]   Fax:  [phone]  Speech Language Pathology Evaluation  Patient Details  Name: Timothy Kim MRN: [id number] Date of Birth: 08-Jul-1929 Referring Provider:  Marletta Lor, MD  Encounter Date: 08/28/2014      End of Session - 08/28/14 1637    Visit Number 1   Number of Visits 3   Date for SLP Re-Evaluation 10/27/14   SLP Start Time 81   SLP Stop Time  1520   SLP Time Calculation (min) 45 min   Activity Tolerance Patient tolerated treatment well      Past Medical History  Diagnosis Date  . ALLERGIC RHINITIS 02/09/2008    Qualifier: Diagnosis of  By: Burnice Logan  MD, [place] HYPERTROPHY 03/03/2007    Qualifier: Diagnosis of  By: Rogue Bussing CMA, Maryann Alar    . DIABETES MELLITUS, TYPE II 03/03/2007    Qualifier: Diagnosis of  By: Rogue Bussing CMA, Maryann Alar    . GERD 03/03/2007    Qualifier: Diagnosis of  By: Rogue Bussing CMA, Maryann Alar    . HYPERTENSION 03/03/2007    Qualifier: Diagnosis of  By: Rogue Bussing CMA, Maryann Alar    . LOW BACK PAIN 06/09/2007    Qualifier: Diagnosis of  By: Burnice Logan  MD, Doretha Sou   . 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 OSTEOARTHRITIS 06/09/2007    Qualifier: Diagnosis of  By: Burnice Logan  MD, Doretha Sou   . Glaucoma   . CLL (chronic lymphocytic leukemia)     Stage 1  . Tonsillar cancer     Right Tonsil, invasive squamous cell  . Cancer 05/2014    tonsil    Past Surgical History  Procedure Laterality Date  . Hernia repair      inguinal  . Rotator cuff repair    . Laminectomy    . Retinal detachment surgery    . Cataract extraction    . Tonsil biopsy Right 05/24/14  . Maxillectomy  07/21/14, [place]    RSND, R rim mandibulectomy, dental extractions, r parascauplar flap reconstruction    There were no vitals taken for this  visit.  Visit Diagnosis: Dysphagia      Subjective Assessment - 08/28/14 1444    Symptoms Pt currently on soft diet/thin liquids. Surgery occurred after pt's last ST evaluation on 06-19-14, on 07-24-14 at [facility] (Dr. Edgar Frisk). Pt's oral anatomy has changed appreciably since that time and pt requires re-evaluation of swallowing skills prior to initiation of his radiation treatment.          SLP Evaluation [facility] - 08/28/14 1445    SLP Visit Information   Medical Diagnosis Rt retromolar trigone SCCA   Pain Assessment   Currently in Pain? No/denies   General Information   HPI Pt with surgery 07-24-14: resection with Dr. Edgar Frisk. Trach, R SND levels 1-3, rt infrastructure maxillectomy with rt rim mandibulectomy, dental extractions, and rt parascapular fasciocutaneous free flap. Pt discharged 07-30-14. Pt to begin 30 fractions of rad tx 09-04-14.   Oral Motor/Sensory Function   Overall Oral Motor/Sensory Function Impaired   Labial ROM Reduced right   Labial Strength Reduced Right   Labial Sensation Reduced Right   Labial Coordination Reduced   Lingual ROM Reduced left  more reduced by bulky flap tissue than lingual weakness   Lingual Strength Within Functional Limits   Lingual Coordination  WFL   Facial Sensation Reduced   Facial Coordination Reduced   Velum --  difficult to visualize rt velar movement due to flap   Overall Oral Motor/Sensory Function Anatomical changes post resection surgery visualized on rt oral cavity   Motor Speech   Overall Motor Speech Appears within functional limits for tasks assessed   Articulation Impaired   Level of Impairment Sentence   Intelligibility Intelligible  95-100%     Pt currently tolerates what appears to be up to dys II-III diet with thin liquids. See above for oral motor assessment POs: Pt ate applesauce without overt s/s aspiration, however trace residuals remained post-swallow near inferior portion of pt's flap, where it met his tongue. Pt  reported small particles of food are noted rarely following POs. SLP recommended pt compete oral care following POs, and alternate bite-sip during POs to flush oral residuals through oral cavity to minimize oral residue. Pt complained to SLP of mild labial spillage with liquids on rt, so SLP suggested pt take liquids on the lt side to minimize spillage. Thyroid elevation appeared WNL, and swallows appeared timely.   Because data states the risk for dysphagia during and after radiation treatment is high due to undergoing radiation tx, SLP reminded the pt about the possibility of reduced/limited ability for PO intake during rad tx. SLP encouraged pt to continue swallowing POs as far into rad tx as possible.  SLP educated pt re: changes to swallowing musculature after rad tx, and why adherence to dysphagia HEP provided today and PO consumption was necessary to inhibit muscular disuse atrophy and to reduce muscle fibrosis following rad tx. Pt demonstrated understanding of these things to SLP.    After eval tasks, SLP then reviewed pt's HEP for pt in December 2015, and SLP performed each exercise. Pt return demonstrated each exercise. Due to occasional min A needed, SLP ensured pt performance was correct prior to moving on to next exercise. Pt was instructed to complete this program 2-3 times a day, 6-7 days/week until 60 days after his last rad tx, then x2-3 a week after that.         SLP Education - 08/28/14 1509    Education provided Yes   Education Details swallow measures (alternate solid/liquid, liquids on lt, oral care following POs), HEP   Person(s) Educated Patient   Methods Explanation;Demonstration;Handout   Comprehension Returned demonstration;Verbalized understanding          SLP Short Term Goals - 08/28/14 1641    SLP SHORT TERM GOAL #1   Title pt will perform HEP with rare min A   Time 1   Period --  visit   Status New   SLP SHORT TERM GOAL #2   Title pt will tell SLP why  he is completing HEP with modified independence   Time 1   Period --  visit   Status New          SLP Long Term Goals - 08/28/14 1641    SLP LONG TERM GOAL #1   Title pt will complete HEP with modified independence   Time 2   Period --  visits   Status New   SLP LONG TERM GOAL #2   Title pt wil tell SLP why a food journal can be helpful when transitioning back to full PO diet.   Time 2   Period --  visits   Status New   SLP LONG TERM GOAL #3   Title pt will tell SLP  3 s/s aspiration PNA with modified independence   Time 2   Period --  visits   Status New          Plan - 09-11-2014 1639    Clinical Impression Statement Pt presents with mild oral dysphagia today, pharyngeal stage appears WFL/WNL from bedside assessment with applesauce/H2O. Data suggest regimen of swallow exercises minimizes pt risk of muscle fibrosis. Cont skilled ST needed to assess safety of PO intake and success with procedure for HEP.    Speech Therapy Frequency --  approx every 4 weeks   Duration --  for 60 days   Treatment/Interventions Pharyngeal strengthening exercises;Oral motor exercises;Patient/family education;SLP instruction and feedback   Potential to Achieve Goals Good   SLP Home Exercise Plan HEP reviewed today   Consulted and Agree with Plan of Care Patient          G-Codes - September 11, 2014 1642    Functional Assessment Tool Used noms   Functional Limitations Swallowing   Swallow Current Status (N9672) At least 1 percent but less than 20 percent impaired, limited or restricted   Swallow Goal Status (E7737) At least 1 percent but less than 20 percent impaired, limited or restricted      Problem List Patient Active Problem List   Diagnosis Date Noted  . 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 Abnormal weight loss 05/31/2014  . Hemoptysis 05/31/2014  . Cancer of the lip, oral cavity, and pharynx 05/30/2014  . CLL (chronic lymphocytic leukemia) 07/07/2012  .  ALLERGIC RHINITIS 02/09/2008  . 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 OSTEOARTHRITIS 06/09/2007  . LOW BACK PAIN 06/09/2007  . DIABETES MELLITUS, TYPE II 03/03/2007  . HYPERTENSION 03/03/2007  . GERD 03/03/2007  . BENIGN PROSTATIC HYPERTROPHY 03/03/2007    Garald Balding, SLP 09-11-2014, 4:43 PM  [facility] [address] [place], [place], [address] Phone: [phone]   Fax:  [phone]

## 2014-08-30 DIAGNOSIS — C062 Malignant neoplasm of retromolar area: Secondary | ICD-10-CM | POA: Diagnosis not present

## 2014-09-04 ENCOUNTER — Encounter: Payer: Self-pay | Admitting: Radiation Oncology

## 2014-09-04 ENCOUNTER — Ambulatory Visit
Admission: RE | Admit: 2014-09-04 | Discharge: 2014-09-04 | Disposition: A | Discharge status: Home / Self Care | Payer: Medicare Other | Source: Ambulatory Visit | Attending: Radiation Oncology | Admitting: Radiation Oncology

## 2014-09-04 ENCOUNTER — Encounter: Payer: Self-pay | Admitting: *Deleted

## 2014-09-04 DIAGNOSIS — C062 Malignant neoplasm of retromolar area: Secondary | ICD-10-CM | POA: Insufficient documentation

## 2014-09-04 MED ORDER — BIAFINE EX EMUL
Freq: Two times a day (BID) | CUTANEOUS | Status: DC
  Administered 2014-09-04: 11:00:00 via TOPICAL

## 2014-09-04 NOTE — Progress Notes (Signed)
IMRT Device Note    ICD-9-CM ICD-10-CM   1. Squamous cell carcinoma of retromolar trigone 145.6 C06.2     10.2 delivered field widths represent one set of IMRT treatment devices. The code is [phone].  -----------------------------------  Eppie Gibson, MD

## 2014-09-04 NOTE — Progress Notes (Signed)
   Weekly Management Note:  Outpatient    ICD-9-CM ICD-10-CM   1. Squamous cell carcinoma of retromolar trigone 145.6 C06.2 topical emolient (BIAFINE) emulsion    Current Dose:  2 Gy  Projected Dose: 60 Gy   Narrative:  The patient presents for routine under treatment assessment.  CBCT/MVCT images/Port film x-rays were reviewed.  The chart was checked. Patient denies pain, fatigue, loss of appetite. He has to eat modified diet due to difficulty with chewing. He has seen Dr Enrique Sack, Garald Balding and Dory Peru.  Patient education completed with patient and his significant other.   Physical Findings:  Wt Readings from Last 3 Encounters:  06/07/14 166 lb (75.297 kg)  05/31/14 164 lb 6.4 oz (74.571 kg)  02/01/14 168 lb (76.204 kg)    weight is 164 lb 12.8 oz (74.753 kg). His oral temperature is 97.5 F (36.4 C). His blood pressure is 124/72 and his pulse is 65. His respiration is 20.  No sign of tumor recurrence in mouth     Impression:  The patient is tolerating radiotherapy.   Plan:  Continue radiotherapy as planned.    -----------------------------------  Eppie Gibson, MD

## 2014-09-04 NOTE — Progress Notes (Addendum)
Patient denies pain, fatigue, loss of appetite. He has to eat modified diet due to difficulty with chewing. He has seen Dr Enrique Sack, Garald Balding and Dory Peru.  Patient education completed with patient and his significant other. Gave him "Radiaiton and You" booklet with all pertinent information marked and discussed, re: fatigue, hair loss/care, mouth irritation/management, nausea/ management, skin irritation/care, throat irritation/care, nutrition, pain. Gave pt Biafine with instructions for proper use. Teach back method used. Pt verbalized understanding.

## 2014-09-04 NOTE — Progress Notes (Signed)
To provide support and encouragement, care continuity and to assess for needs, met with patient during [place].  He was accompanied by his SO.  Patient tolerated treatment well, denied any concerns or issues.  I encouraged him to focus on each day's treatment.  He verbalized agreement.  Gayleen Orem, RN, BSN, [facility] [phone]

## 2014-09-05 ENCOUNTER — Ambulatory Visit: Payer: Medicare Other | Admitting: Nutrition

## 2014-09-05 ENCOUNTER — Ambulatory Visit
Admission: RE | Admit: 2014-09-05 | Discharge: 2014-09-05 | Disposition: A | Discharge status: Home / Self Care | Payer: Medicare Other | Source: Ambulatory Visit | Attending: Radiation Oncology | Admitting: Radiation Oncology

## 2014-09-05 DIAGNOSIS — C062 Malignant neoplasm of retromolar area: Secondary | ICD-10-CM | POA: Diagnosis not present

## 2014-09-05 NOTE — Progress Notes (Signed)
Nutrition follow-up completed with patient. Patient is receiving radiation therapy for tonsil cancer. Weight documented as 164.8 pounds March 14, down from usual body weight of 182 pounds. Patient reports he is eating two meals +2 snacks consisting of soft foods.   He enjoys drinking 2% milk.   He is drinking one Carnation breakfast essentials daily. Patient reports he is only able to consume 50 g protein daily via liquids and mechanical soft diet.  Estimated Nutrition Needs:  2000-2200 calories, 90-100 grams protein, 2 L fluid.  Nutrition diagnosis: Food and nutrition related knowledge deficit continues.  Intervention: Educated patient to increase calories and protein in 3 meals and snacks daily. Recommended patient increase Carnation breakfast essentials twice a day using recipes for fortified milk. Recommended additional soft moist protein foods patient could try at mealtimes. Provided recipes and fact sheets.  Questions were answered.  Teach back method used.  Monitoring, evaluation, goals: patient will increase calories and protein to promote weight maintenance and healing. He will increase CIB to BID.  Next Visit: Tuesday, March 29 before radiation treatment.  **Disclaimer: This note was dictated with voice recognition software. Similar sounding words can inadvertently be transcribed and this note may contain transcription errors which may not have been corrected upon publication of note.**

## 2014-09-06 ENCOUNTER — Ambulatory Visit
Admission: RE | Admit: 2014-09-06 | Discharge: 2014-09-06 | Disposition: A | Discharge status: Home / Self Care | Payer: Medicare Other | Source: Ambulatory Visit | Attending: Radiation Oncology | Admitting: Radiation Oncology

## 2014-09-06 DIAGNOSIS — C062 Malignant neoplasm of retromolar area: Secondary | ICD-10-CM | POA: Diagnosis not present

## 2014-09-07 ENCOUNTER — Ambulatory Visit
Admission: RE | Admit: 2014-09-07 | Discharge: 2014-09-07 | Disposition: A | Discharge status: Home / Self Care | Payer: Medicare Other | Source: Ambulatory Visit | Attending: Radiation Oncology | Admitting: Radiation Oncology

## 2014-09-07 DIAGNOSIS — C062 Malignant neoplasm of retromolar area: Secondary | ICD-10-CM | POA: Diagnosis not present

## 2014-09-08 ENCOUNTER — Ambulatory Visit
Admission: RE | Admit: 2014-09-08 | Discharge: 2014-09-08 | Disposition: A | Discharge status: Home / Self Care | Payer: Medicare Other | Source: Ambulatory Visit | Attending: Radiation Oncology | Admitting: Radiation Oncology

## 2014-09-08 DIAGNOSIS — C062 Malignant neoplasm of retromolar area: Secondary | ICD-10-CM | POA: Diagnosis not present

## 2014-09-11 ENCOUNTER — Ambulatory Visit
Admission: RE | Admit: 2014-09-11 | Discharge: 2014-09-11 | Disposition: A | Discharge status: Home / Self Care | Payer: Medicare Other | Source: Ambulatory Visit | Attending: Radiation Oncology | Admitting: Radiation Oncology

## 2014-09-11 ENCOUNTER — Encounter: Payer: Self-pay | Admitting: Radiation Oncology

## 2014-09-11 VITALS — BP 122/78 | HR 69 | Temp 97.8°F | Resp 16 | Wt 161.8 lb

## 2014-09-11 DIAGNOSIS — C062 Malignant neoplasm of retromolar area: Secondary | ICD-10-CM | POA: Diagnosis not present

## 2014-09-11 NOTE — Progress Notes (Signed)
   Weekly Management Note:  Outpatient    ICD-9-CM ICD-10-CM   1. Squamous cell carcinoma of retromolar trigone 145.6 C06.2      Current Dose:  12 Gy  Projected Dose: 60 Gy   Narrative:  The patient presents for routine under treatment assessment.  CBCT/MVCT images/Port film x-rays were reviewed.  The chart was checked.  Weekly rd txs 6/30 tonsiil/right neck completed, loss 3 lbs since last week, eating soft foods, drinking 1 can carnation instant breakfast dialy will increase to 2 cans daily between meals as per Ernestene Kiel sugesstion, patient states rope like saliva, also decreased hearing and stopped up from sinus congestion,taking mucinex now and saline nasal spray prn, , no energy stated,  Not using biafine as yet, neck dry and start of erythema, no c/o pain or nausea     Physical Findings:     Filed Vitals:   09/11/14 1144  BP: 122/78  Pulse: 69  Temp: 97.8 F (36.6 C)  Resp: 16   Filed Weights   09/11/14 1144  Weight: 161 lb 12.8 oz (73.392 kg)   No thrush or oral lesions. Skin smooth, intact.   Impression:  The patient is tolerating radiotherapy.   Plan:  Continue radiotherapy as planned.  Lidocaine Rx'd in case mucositis develops this week. Encouraged increasing PO intake due to weight loss. Discussed strategies for thick saliva. He'll discuss sinus sx with his ENT surgeon.  -----------------------------------  Eppie Gibson, MD

## 2014-09-11 NOTE — Progress Notes (Signed)
Weekly rd txs 6/30 tonsiil/right neck completed, loss 3 lbs since last week, eating soft foods, drinking 1 can carnation instant breakfast dialy will increase to 2 cand daily between meals as per Ernestene Kiel sugesstion, patient states rope like saliva, also decreased hearing and stopped up from sinus congestion,taking mucnex now and saline nasal spray prn, , no energy stated,  Not using biafine as yet, neck dry and start of erythema, no c/o pain or nausea 11:49 AM

## 2014-09-12 ENCOUNTER — Ambulatory Visit
Admission: RE | Admit: 2014-09-12 | Discharge: 2014-09-12 | Disposition: A | Discharge status: Home / Self Care | Payer: Medicare Other | Source: Ambulatory Visit | Attending: Radiation Oncology | Admitting: Radiation Oncology

## 2014-09-12 DIAGNOSIS — C062 Malignant neoplasm of retromolar area: Secondary | ICD-10-CM | POA: Diagnosis not present

## 2014-09-13 ENCOUNTER — Other Ambulatory Visit: Payer: Self-pay | Admitting: Radiation Oncology

## 2014-09-13 ENCOUNTER — Ambulatory Visit
Admission: RE | Admit: 2014-09-13 | Discharge: 2014-09-13 | Disposition: A | Discharge status: Home / Self Care | Payer: Medicare Other | Source: Ambulatory Visit | Attending: Radiation Oncology | Admitting: Radiation Oncology

## 2014-09-13 ENCOUNTER — Telehealth: Payer: Self-pay | Admitting: *Deleted

## 2014-09-13 DIAGNOSIS — C062 Malignant neoplasm of retromolar area: Secondary | ICD-10-CM

## 2014-09-13 MED ORDER — LIDOCAINE VISCOUS 2 % MT SOLN: OROMUCOSAL | Status: DC

## 2014-09-13 NOTE — Telephone Encounter (Signed)
Received call from patient stating when he saw Timothy Kim Monday she told him she would order MMW. He states his pharmacy does not have the medication prescription on file for him. Per Epic, this medication was not ordered. Informed Timothy Kim of this finding and that Timothy Kim is out of office until 09/15/14. Inquired if he needs this medication today. Timothy Kim stated, "I don't seem to have a problem right now." He was in agreement for Timothy Kim to order medication when she returns to this office on Friday. His pharmacy of choice is [facility].

## 2014-09-14 ENCOUNTER — Telehealth: Payer: Self-pay | Admitting: *Deleted

## 2014-09-14 ENCOUNTER — Ambulatory Visit
Admission: RE | Admit: 2014-09-14 | Discharge: 2014-09-14 | Disposition: A | Discharge status: Home / Self Care | Payer: Medicare Other | Source: Ambulatory Visit | Attending: Radiation Oncology | Admitting: Radiation Oncology

## 2014-09-14 DIAGNOSIS — C062 Malignant neoplasm of retromolar area: Secondary | ICD-10-CM | POA: Diagnosis not present

## 2014-09-14 NOTE — Telephone Encounter (Signed)
Per Dr Isidore Moos, called patient and informed him MMW ready for pick [facility] at [facility]. He verbalized appreciation and understanding.

## 2014-09-15 ENCOUNTER — Encounter: Payer: Self-pay | Admitting: *Deleted

## 2014-09-15 ENCOUNTER — Ambulatory Visit
Admission: RE | Admit: 2014-09-15 | Discharge: 2014-09-15 | Disposition: A | Discharge status: Home / Self Care | Payer: Medicare Other | Source: Ambulatory Visit | Attending: Radiation Oncology | Admitting: Radiation Oncology

## 2014-09-15 DIAGNOSIS — C062 Malignant neoplasm of retromolar area: Secondary | ICD-10-CM | POA: Diagnosis not present

## 2014-09-15 NOTE — Progress Notes (Signed)
To provide support and encouragement, care continuity and to assess for needs, met with patient prior to [place] tmt.  His SO, Barnetta Chapel, accompanied him. 1. He reported:  Successful tolerance of 1st 2 weeks of tmt.  Absence of SEs. 2. We discussed the probability of sore throat developing into 3rd week of tmt and his using the lidocaine rinse as needed for relief. 3. He did not express any needs or concerns at this time, I encouraged him to contact me if that changes, he verbalized understanding.  Gayleen Orem, RN, BSN, [facility] [phone]

## 2014-09-18 ENCOUNTER — Ambulatory Visit
Admission: RE | Admit: 2014-09-18 | Discharge: 2014-09-18 | Disposition: A | Discharge status: Home / Self Care | Payer: Medicare Other | Source: Ambulatory Visit | Attending: Radiation Oncology | Admitting: Radiation Oncology

## 2014-09-18 ENCOUNTER — Encounter: Payer: Self-pay | Admitting: Radiation Oncology

## 2014-09-18 VITALS — BP 92/60 | HR 83 | Temp 97.5°F | Ht 65.0 in | Wt 155.3 lb

## 2014-09-18 DIAGNOSIS — C062 Malignant neoplasm of retromolar area: Secondary | ICD-10-CM

## 2014-09-18 NOTE — Progress Notes (Addendum)
Timothy Kim has received 11 fractions to his right neck.  Note erythema, skin intact.  He denies any pain presently.  He reports difficulty chewing food and feels this is very discouraged.  He has lost 5 lbs since his last weight. He drinks 2 Ensure daily, therefore suggested increasing to 4 to obtain more nutrition.  He agreed.

## 2014-09-18 NOTE — Progress Notes (Signed)
   Weekly Management Note:  Outpatient    ICD-9-CM ICD-10-CM   1. Squamous cell carcinoma of retromolar trigone 145.6 C06.2      Current Dose:  22 Gy  Projected Dose: 60 Gy   Narrative:  The patient presents for routine under treatment assessment.  CBCT/MVCT images/Port film x-rays were reviewed.  The chart was checked.    He denies any pain presently. He reports difficulty chewing food and feels this is very discouraged. He has lost 5 lbs since his last weight. He drinks 2 Ensure daily  Physical Findings:  Not orthostatic   Filed Vitals:   09/18/14 1207  BP: 92/60  Pulse: 83  Temp:    Filed Weights   09/18/14 1201  Weight: 155 lb 4.8 oz (70.444 kg)   No thrush . There is patchy mucositis. At former trach site, there is a scab, and a little pus is expressed from under the scab when pressed.    Impression:  The patient is tolerating radiotherapy.   Plan:  Continue radiotherapy as planned.  Stanton Kidney, RN will contact Dr Waynard Edwards nurse re: trach site findings. Triple antibiotic ointment given to apply TID.  Low BP and weight loss: Discussed importance of good hydration and nutrition.  Gave multiple suggestions for this.  Will hold Lotensin HCT for now given low BP.  Pt will call if signs of ankle edema, and otherwise discuss resuming this med with his PCP at check up in May.  Check BMP next week -----------------------------------  Eppie Gibson, MD

## 2014-09-19 ENCOUNTER — Ambulatory Visit: Payer: Medicare Other | Admitting: Nutrition

## 2014-09-19 ENCOUNTER — Encounter: Payer: Self-pay | Admitting: Nutrition

## 2014-09-19 ENCOUNTER — Ambulatory Visit
Admission: RE | Admit: 2014-09-19 | Discharge: 2014-09-19 | Disposition: A | Discharge status: Home / Self Care | Payer: Medicare Other | Source: Ambulatory Visit | Attending: Radiation Oncology | Admitting: Radiation Oncology

## 2014-09-19 DIAGNOSIS — C062 Malignant neoplasm of retromolar area: Secondary | ICD-10-CM | POA: Diagnosis not present

## 2014-09-19 NOTE — Progress Notes (Signed)
Patient follow-up completed prior to radiation therapy for tonsil cancer. Patient reports difficulty chewing and swallowing.  He has been consuming two Ensure Plus daily. He reports eating takes much longer than it did before. He is drinking 2% milk and questions if he should increase to whole milk.   Weight documented as 155.3 pounds March 28 decrease 9.5 pounds in 2 weeks.  Nutrition diagnosis: Food and nutrition related knowledge deficit continues.  Intervention: Patient educated to increase Ensure Plus 4 times a day along with soft moist foods to minimize further weight loss. Recommended patient consume Ensure Plus 6 times daily if he is unable to eat soft moist foods. Provided patient with one complementary case of Ensure Plus. Provided patient with a list of foods he may tolerate well. Recommended patient increase 2% milk to whole milk. Questions were answered.  Teach back method used.  Monitoring, evaluation, goals:  Patient will work to increase oral intake to minimize further weight loss.   He will increase Ensure Plus 4 times a day.  Next visit: Tuesday, April 12, before radiation therapy.  **Disclaimer: This note was dictated with voice recognition software. Similar sounding words can inadvertently be transcribed and this note may contain transcription errors which may not have been corrected upon publication of note.**

## 2014-09-20 ENCOUNTER — Ambulatory Visit
Admission: RE | Admit: 2014-09-20 | Discharge: 2014-09-20 | Disposition: A | Discharge status: Home / Self Care | Payer: Medicare Other | Source: Ambulatory Visit | Attending: Radiation Oncology | Admitting: Radiation Oncology

## 2014-09-20 DIAGNOSIS — C062 Malignant neoplasm of retromolar area: Secondary | ICD-10-CM | POA: Diagnosis not present

## 2014-09-21 ENCOUNTER — Ambulatory Visit
Admission: RE | Admit: 2014-09-21 | Discharge: 2014-09-21 | Disposition: A | Discharge status: Home / Self Care | Payer: Medicare Other | Source: Ambulatory Visit | Attending: Radiation Oncology | Admitting: Radiation Oncology

## 2014-09-21 DIAGNOSIS — C062 Malignant neoplasm of retromolar area: Secondary | ICD-10-CM | POA: Diagnosis not present

## 2014-09-22 ENCOUNTER — Ambulatory Visit
Admission: RE | Admit: 2014-09-22 | Discharge: 2014-09-22 | Disposition: A | Discharge status: Home / Self Care | Payer: Medicare Other | Source: Ambulatory Visit | Attending: Radiation Oncology | Admitting: Radiation Oncology

## 2014-09-22 DIAGNOSIS — C062 Malignant neoplasm of retromolar area: Secondary | ICD-10-CM | POA: Diagnosis not present

## 2014-09-25 ENCOUNTER — Ambulatory Visit
Admission: RE | Admit: 2014-09-25 | Discharge: 2014-09-25 | Disposition: A | Discharge status: Home / Self Care | Payer: Medicare Other | Source: Ambulatory Visit | Attending: Radiation Oncology | Admitting: Radiation Oncology

## 2014-09-25 ENCOUNTER — Ambulatory Visit: Payer: Medicare Other

## 2014-09-25 ENCOUNTER — Encounter: Payer: Self-pay | Admitting: Radiation Oncology

## 2014-09-25 VITALS — BP 118/65 | HR 60 | Temp 98.0°F | Resp 20 | Wt 159.7 lb

## 2014-09-25 DIAGNOSIS — C062 Malignant neoplasm of retromolar area: Secondary | ICD-10-CM

## 2014-09-25 NOTE — Progress Notes (Addendum)
Patient denies pain, difficulty eating, swallowing. He does have some difficulty chewing foods well. He is drinking 4 cans Boost daily. He has slight fatigue, loss of appetite, but he has gained 4 lb 6 oz since last week. He has not begun applying Biafine to neck treatment area; advised he begin. Skin is pink, dry in treatment area.  He was applying antibiotic ointment to trach stoma sight, states "it is healing", denies drainage. Area is dry, closed, no swelling, redness noted.  BP 118/65 mmHg  Pulse 60  Temp(Src) 98 F (36.7 C) (Oral)  Resp 20  Wt 159 lb 11.2 oz (72.439 kg)

## 2014-09-25 NOTE — Progress Notes (Signed)
   Weekly Management Note:  Outpatient    ICD-9-CM ICD-10-CM   1. 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  CBCT/MVCT images/Port film x-rays were reviewed.  The chart was checked. Better PO intake. Doing well. No pain. Applying antibiotic ointment to stoma scar, and it is better Weight is 4 lb higher   Physical Findings:   Filed Vitals:   09/25/14 1148  BP: 118/65  Pulse: 60  Temp: 98 F (36.7 C)  Resp: 20   Filed Weights   09/25/14 1148  Weight: 159 lb 11.2 oz (72.439 kg)   No thrush . There is patchy mucositis. At former trach site, there is a scab,but no pus. Skin over right face slightly dry  CMP     Component Value Date/Time   NA 142 07/19/2013 1136   NA 143 01/07/2012 0936   K 4.3 07/19/2013 1136   K 4.1 01/07/2012 0936   CL 103 01/07/2012 0936   CO2 31* 07/19/2013 1136   CO2 29 01/07/2012 0936   GLUCOSE 127 07/19/2013 1136   GLUCOSE 98 01/07/2012 0936   BUN 14.0 07/19/2013 1136   BUN 16 01/07/2012 0936   CREATININE 0.9 07/19/2013 1136   CREATININE 1.0 01/07/2012 0936   CALCIUM 9.4 07/19/2013 1136   CALCIUM 9.3 01/07/2012 0936   PROT 6.9 07/19/2013 1136   PROT 7.2 01/07/2012 0936   ALBUMIN 3.7 07/19/2013 1136   ALBUMIN 4.0 01/07/2012 0936   AST 42* 07/19/2013 1136   AST 47* 01/07/2012 0936   ALT 27 07/19/2013 1136   ALT 28 01/07/2012 0936   ALKPHOS 73 07/19/2013 1136   ALKPHOS 59 01/07/2012 0936   BILITOT 0.92 07/19/2013 1136   BILITOT 1.0 01/07/2012 0936   GFRNONAA 81.57 12/12/2009 0000   GFRAA 121 08/09/2008 1103     Impression:  The patient is tolerating radiotherapy.   Plan:  Continue radiotherapy as planned. Biafine for dry skin Lidocaine for future mucositis prn   -----------------------------------  Eppie Gibson, MD

## 2014-09-26 ENCOUNTER — Ambulatory Visit
Admission: RE | Admit: 2014-09-26 | Discharge: 2014-09-26 | Disposition: A | Discharge status: Home / Self Care | Payer: Medicare Other | Source: Ambulatory Visit | Attending: Radiation Oncology | Admitting: Radiation Oncology

## 2014-09-26 ENCOUNTER — Encounter: Payer: Self-pay | Admitting: Nutrition

## 2014-09-26 DIAGNOSIS — C062 Malignant neoplasm of retromolar area: Secondary | ICD-10-CM | POA: Diagnosis not present

## 2014-09-27 ENCOUNTER — Ambulatory Visit
Admission: RE | Admit: 2014-09-27 | Discharge: 2014-09-27 | Disposition: A | Discharge status: Home / Self Care | Payer: Medicare Other | Source: Ambulatory Visit | Attending: Radiation Oncology | Admitting: Radiation Oncology

## 2014-09-27 DIAGNOSIS — C062 Malignant neoplasm of retromolar area: Secondary | ICD-10-CM | POA: Diagnosis not present

## 2014-09-28 ENCOUNTER — Ambulatory Visit
Admission: RE | Admit: 2014-09-28 | Discharge: 2014-09-28 | Disposition: A | Discharge status: Home / Self Care | Payer: Medicare Other | Source: Ambulatory Visit | Attending: Radiation Oncology | Admitting: Radiation Oncology

## 2014-09-28 ENCOUNTER — Encounter: Payer: Self-pay | Admitting: *Deleted

## 2014-09-28 DIAGNOSIS — C062 Malignant neoplasm of retromolar area: Secondary | ICD-10-CM | POA: Diagnosis not present

## 2014-09-28 NOTE — Progress Notes (Signed)
To provide support and encouragement, care continuity and to assess for needs, met with patient after his Tomo tmt.  His SO, Katherine, accompanied him. 1. He reported that he is beginning to have a slight sore throat.  We discussed the use of the Lidocaine before meals at at bedtime to relieve discomfort. 2. He reported some difficulty in consuming 4 cans/daily of Ensure Plus b/c of its sweetness.  We discussed importance of maximizing intake of this supplement.  I suggested he try drinking 1/2 can multiple times throughout the day rather than a full can at one sitting.  He asked about diluting with milk to improve palatability, I encouraged him to try. 3. In response to my inquiry, he indicated he has not been applying Biafine.   We discussed the benefits of applying this emollient 2-3 multiple times daily.  He understands not to apply within 4 hrs of tmt. 4. He reported sinus drainage that interrupts his sleep.  I suggested he call his ENT Dr. Erik Obey for guidance and/or report this to Dr. Isidore Moos during next week's [place] appt. 5. He understands I can be contacted with needs/concerns.  Gayleen Orem, RN, BSN, [facility] [phone]

## 2014-09-29 ENCOUNTER — Ambulatory Visit
Admission: RE | Admit: 2014-09-29 | Discharge: 2014-09-29 | Disposition: A | Discharge status: Home / Self Care | Payer: Medicare Other | Source: Ambulatory Visit | Attending: Radiation Oncology | Admitting: Radiation Oncology

## 2014-09-29 DIAGNOSIS — C062 Malignant neoplasm of retromolar area: Secondary | ICD-10-CM | POA: Diagnosis not present

## 2014-10-02 ENCOUNTER — Ambulatory Visit
Admission: RE | Admit: 2014-10-02 | Discharge: 2014-10-02 | Disposition: A | Discharge status: Home / Self Care | Payer: Medicare Other | Source: Ambulatory Visit | Attending: Radiation Oncology | Admitting: Radiation Oncology

## 2014-10-02 ENCOUNTER — Encounter: Payer: Self-pay | Admitting: Radiation Oncology

## 2014-10-02 ENCOUNTER — Ambulatory Visit
Admission: RE | Admit: 2014-10-02 | Discharge: 2014-10-02 | Disposition: A | Discharge status: Home / Self Care | Payer: Federal, State, Local not specified - PPO | Source: Ambulatory Visit | Attending: Radiation Oncology | Admitting: Radiation Oncology

## 2014-10-02 VITALS — BP 124/67 | HR 57 | Resp 16 | Wt 160.2 lb

## 2014-10-02 DIAGNOSIS — C062 Malignant neoplasm of retromolar area: Secondary | ICD-10-CM

## 2014-10-02 NOTE — Progress Notes (Signed)
Hyperpigmentation without desquamation of right/treated neck noted. Patient confirms using Biafine bid as directed. Weight and vitals stable. Reports all shooting pain in right side of face following surgery has resolved. Denies hemoptysis. Denies cough or SOB. Reports consuming four cans of Boost per day. Reports mild fatigue. Complains of difficulty sleeping related to nasal and throat congestion. Reports every two hours he is up to clear his nose and throat so he can breath better.

## 2014-10-02 NOTE — Progress Notes (Signed)
   Weekly Management Note:  Outpatient    ICD-9-CM ICD-10-CM   1. 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  CBCT/MVCT images/Port film x-rays were reviewed. Hyperpigmentation without desquamation of right/treated neck noted. Patient confirms using Biafine bid as directed. Weight and vitals stable. Reports all shooting pain in right side of face following surgery has resolved. Denies hemoptysis. Denies cough or SOB. Reports consuming four cans of Boost per day. Reports mild fatigue. Complains of difficulty sleeping related to nasal and throat congestion. Reports every two hours he is up to clear his nose and throat so he can breath better. This has been an issue since surgery.  Hasn't used lidocaine yet; denies pain from mucositis  Physical Findings:   Filed Vitals:   10/02/14 1154  BP: 124/67  Pulse: 57  Resp: 16   Filed Weights   10/02/14 1154  Weight: 160 lb 3.2 oz (72.666 kg)   No thrush . There is patchy mucositis; mouth somewhat dry.  Skin over right face slightly dry  CMP     Component Value Date/Time   NA 142 07/19/2013 1136   NA 143 01/07/2012 0936   K 4.3 07/19/2013 1136   K 4.1 01/07/2012 0936   CL 103 01/07/2012 0936   CO2 31* 07/19/2013 1136   CO2 29 01/07/2012 0936   GLUCOSE 127 07/19/2013 1136   GLUCOSE 98 01/07/2012 0936   BUN 14.0 07/19/2013 1136   BUN 16 01/07/2012 0936   CREATININE 0.9 07/19/2013 1136   CREATININE 1.0 01/07/2012 0936   CALCIUM 9.4 07/19/2013 1136   CALCIUM 9.3 01/07/2012 0936   PROT 6.9 07/19/2013 1136   PROT 7.2 01/07/2012 0936   ALBUMIN 3.7 07/19/2013 1136   ALBUMIN 4.0 01/07/2012 0936   AST 42* 07/19/2013 1136   AST 47* 01/07/2012 0936   ALT 27 07/19/2013 1136   ALT 28 01/07/2012 0936   ALKPHOS 73 07/19/2013 1136   ALKPHOS 59 01/07/2012 0936   BILITOT 0.92 07/19/2013 1136   BILITOT 1.0  01/07/2012 0936   GFRNONAA 81.57 12/12/2009 0000   GFRAA 121 08/09/2008 1103     Impression:  The patient is tolerating radiotherapy.   Plan:  Continue radiotherapy as planned. Biafine for dry skin Lidocaine for future mucositis prn Baking soda gargles Pt will call ENT surgeon re: congestion   -----------------------------------  Eppie Gibson, MD

## 2014-10-03 ENCOUNTER — Ambulatory Visit: Payer: Medicare Other | Admitting: Nutrition

## 2014-10-03 ENCOUNTER — Ambulatory Visit
Admission: RE | Admit: 2014-10-03 | Discharge: 2014-10-03 | Disposition: A | Discharge status: Home / Self Care | Payer: Medicare Other | Source: Ambulatory Visit | Attending: Radiation Oncology | Admitting: Radiation Oncology

## 2014-10-03 DIAGNOSIS — C062 Malignant neoplasm of retromolar area: Secondary | ICD-10-CM | POA: Diagnosis not present

## 2014-10-03 NOTE — Progress Notes (Signed)
Follow-up completed with patient prior to radiation therapy for tonsil cancer.  Patient reports he has had continued difficulty chewing and swallowing.  He continues to increase Ensure Plus and Carnation breakfast essentials now consuming 3-4 daily.   He is now drinking whole milk instead of 2% milk. Weight increased and documented as 160.2 pounds April 11 up from 155.3 pounds March 28.  Nutrition diagnosis: Food and nutrition related knowledge deficit improved   Intervention:  Patient educated to continue Ensure Plus or Carnation breakfast essentials 4 times a day  Provided second complimentary case of Ensure Plus  Educated patient on blenderizing meats so that he can consume protein foods as desired  Teach back method used.  Monitoring, evaluation, goals: Patient has been able to increase oral intake and has had 5 pound weight gain.  Next visit: Tuesday, April 19 before radiation therapy.  **Disclaimer: This note was dictated with voice recognition software. Similar sounding words can inadvertently be transcribed and this note may contain transcription errors which may not have been corrected upon publication of note.**

## 2014-10-04 ENCOUNTER — Ambulatory Visit
Admission: RE | Admit: 2014-10-04 | Discharge: 2014-10-04 | Disposition: A | Discharge status: Home / Self Care | Payer: Medicare Other | Source: Ambulatory Visit | Attending: Radiation Oncology | Admitting: Radiation Oncology

## 2014-10-04 DIAGNOSIS — C062 Malignant neoplasm of retromolar area: Secondary | ICD-10-CM | POA: Diagnosis not present

## 2014-10-05 ENCOUNTER — Ambulatory Visit
Admission: RE | Admit: 2014-10-05 | Discharge: 2014-10-05 | Disposition: A | Discharge status: Home / Self Care | Payer: Medicare Other | Source: Ambulatory Visit | Attending: Radiation Oncology | Admitting: Radiation Oncology

## 2014-10-05 DIAGNOSIS — C062 Malignant neoplasm of retromolar area: Secondary | ICD-10-CM | POA: Diagnosis not present

## 2014-10-06 ENCOUNTER — Encounter: Payer: Self-pay | Admitting: *Deleted

## 2014-10-06 ENCOUNTER — Ambulatory Visit
Admission: RE | Admit: 2014-10-06 | Discharge: 2014-10-06 | Disposition: A | Discharge status: Home / Self Care | Payer: Medicare Other | Source: Ambulatory Visit | Attending: Radiation Oncology | Admitting: Radiation Oncology

## 2014-10-06 DIAGNOSIS — C062 Malignant neoplasm of retromolar area: Secondary | ICD-10-CM | POA: Diagnosis not present

## 2014-10-09 ENCOUNTER — Telehealth: Payer: Self-pay | Admitting: *Deleted

## 2014-10-09 ENCOUNTER — Ambulatory Visit: Payer: Medicare Other | Attending: Radiation Oncology

## 2014-10-09 ENCOUNTER — Ambulatory Visit
Admission: RE | Admit: 2014-10-09 | Discharge: 2014-10-09 | Disposition: A | Discharge status: Home / Self Care | Payer: Medicare Other | Source: Ambulatory Visit | Attending: Radiation Oncology | Admitting: Radiation Oncology

## 2014-10-09 ENCOUNTER — Encounter: Payer: Self-pay | Admitting: Radiation Oncology

## 2014-10-09 ENCOUNTER — Ambulatory Visit: Payer: Medicare Other

## 2014-10-09 VITALS — BP 89/58 | HR 75 | Temp 97.9°F | Resp 12 | Wt 156.6 lb

## 2014-10-09 DIAGNOSIS — E119 Type 2 diabetes mellitus without complications: Secondary | ICD-10-CM | POA: Insufficient documentation

## 2014-10-09 DIAGNOSIS — R1311 Dysphagia, oral phase: Secondary | ICD-10-CM | POA: Diagnosis not present

## 2014-10-09 DIAGNOSIS — C062 Malignant neoplasm of retromolar area: Secondary | ICD-10-CM | POA: Insufficient documentation

## 2014-10-09 DIAGNOSIS — R1310 Dysphagia, unspecified: Secondary | ICD-10-CM

## 2014-10-09 DIAGNOSIS — I10 Essential (primary) hypertension: Secondary | ICD-10-CM | POA: Insufficient documentation

## 2014-10-09 DIAGNOSIS — K219 Gastro-esophageal reflux disease without esophagitis: Secondary | ICD-10-CM | POA: Diagnosis not present

## 2014-10-09 MED ORDER — BIAFINE EX EMUL
Freq: Two times a day (BID) | CUTANEOUS | Status: DC
  Administered 2014-10-09: 18:00:00 via TOPICAL

## 2014-10-09 MED ORDER — FLUCONAZOLE 100 MG PO TABS: ORAL_TABLET | ORAL | Status: DC

## 2014-10-09 NOTE — Progress Notes (Signed)

## 2014-10-09 NOTE — Progress Notes (Signed)
He rates his pain as a 3 on a scale of 0-10. intermittent, burning and tingling over Right neck  Pt complains of fatigue, weakness and loss of sleep.  Pt has had dysphagia for solids. Pt reports a regular diet plus nutritonal beverage given orally formula-ensure, 4 cans per day. Oral exam reveals mild erythema with patches of white with white, yellow and tenacious sputum. Skin exam reveals Dryness and erythema- continues to apply Biafine as directed. Noted a small amount of watery red drainage from treatment area. Pt reports having a bowel 2-3 times week.  Constipation handout given  Wt Readings from Last 3 Encounters:  09/25/14 159 lb 11.2 oz (72.439 kg)  06/07/14 166 lb (75.297 kg)  05/31/14 164 lb 6.4 oz (74.571 kg)   BP 89/58 mmHg  Pulse 75  Temp(Src) 97.9 F (36.6 C) (Oral)  Resp 12  Wt 156 lb 9.6 oz (71.033 kg)  SpO2 99%  Orthostatic VS BP 89/58, P 75, Pox 99%

## 2014-10-09 NOTE — Telephone Encounter (Signed)
CALLED PATIENT TO INFORM OF LAB ON 10-10-14 AFTER RT, LVM FOR  A RETURN CALL

## 2014-10-09 NOTE — Therapy (Signed)
[facility] [address] [place], [place], [address] Phone: [phone]   Fax:  [phone]  Speech Language Pathology Treatment  Patient Details  Name: Timothy Kim MRN: [id number] Date of Birth: 17-Oct-1929 Referring Provider:  Marletta Lor, MD  Encounter Date: 10/09/2014      End of Session - 10/09/14 1402    Visit Number 2   Number of Visits 3   Date for SLP Re-Evaluation 10/27/14   SLP Start Time 1315   SLP Stop Time  0737   SLP Time Calculation (min) 40 min      Past Medical History  Diagnosis Date  . ALLERGIC RHINITIS 02/09/2008    Qualifier: Diagnosis of  By: Burnice Logan  MD, [place] HYPERTROPHY 03/03/2007    Qualifier: Diagnosis of  By: Rogue Bussing CMA, Maryann Alar    . DIABETES MELLITUS, TYPE II 03/03/2007    Qualifier: Diagnosis of  By: Rogue Bussing CMA, Maryann Alar    . GERD 03/03/2007    Qualifier: Diagnosis of  By: Rogue Bussing CMA, Maryann Alar    . HYPERTENSION 03/03/2007    Qualifier: Diagnosis of  By: Rogue Bussing CMA, Maryann Alar    . LOW BACK PAIN 06/09/2007    Qualifier: Diagnosis of  By: Burnice Logan  MD, Doretha Sou   . 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 OSTEOARTHRITIS 06/09/2007    Qualifier: Diagnosis of  By: Burnice Logan  MD, Doretha Sou   . Glaucoma   . CLL (chronic lymphocytic leukemia)     Stage 1  . Tonsillar cancer     Right Tonsil, invasive squamous cell  . Cancer 05/2014    tonsil    Past Surgical History  Procedure Laterality Date  . Hernia repair      inguinal  . Rotator cuff repair    . Laminectomy    . Retinal detachment surgery    . Cataract extraction    . Tonsil biopsy Right 05/24/14  . Maxillectomy  07/21/14, [place]    RSND, R rim mandibulectomy, dental extractions, r parascauplar flap reconstruction    There were no vitals filed for this visit.  Visit Diagnosis: Dysphagia      Subjective Assessment  - 10/09/14 1331    Subjective Pt reports he thought he was to complete HEP when he had completed rad tx, until he looked at HEP last night and realized he was to be completing HEP during rad tx.   Patient is accompained by: --  alone               ADULT SLP TREATMENT - 10/09/14 1333    General Information   Behavior/Cognition Alert;Cooperative;Pleasant mood   Treatment Provided   Treatment provided Dysphagia   Dysphagia Treatment   Temperature Spikes Noted No   Respiratory Status Room air   Oral Cavity - Dentition Missing dentition  rt side. Evidence of candidiasis   Treatment Methods Therapeutic exercise;Skilled observation   Patient observed directly with PO's Yes   Type of PO's observed Thin liquids  yogurt, puree, and liquids   Oral Phase Signs & Symptoms --  none   Pharyngeal Phase Signs & Symptoms Delayed cough  after Masako trials with small sip H2O   Type of cueing Verbal   Amount of cueing Minimal   Other treatment/comments SLP encouraged pt to attempt Masako with wet spoon instead of sips H2O. SLP assisted pt with HEP with min-mod  A occasionally. Pt most difficulty with Masako. SLP ensured pt was independent with HEP before end of session today. Pt with constant post nasal drip today, clearing throat throughout session.    Pain Assessment   Pain Assessment No/denies pain   Assessment / Recommendations / Plan   Plan Continue with current plan of care   Dysphagia Recommendations   Diet recommendations Dysphagia 1 (puree);Thin liquid;Nectar-thick liquid  as tolerated   Liquids provided via Cup   Supervision Patient able to self feed   Compensations Small sips/bites;Follow solids with liquid   Progression Toward Goals   Progression toward goals Progressing toward goals          SLP Education - 10/09/14 1401    Education provided Yes   Education Details HEP, small sips/bites and liquid wash   Person(s) Educated Patient   Methods Explanation;Verbal  cues;Demonstration   Comprehension Verbalized understanding;Returned demonstration          SLP Short Term Goals - 10/09/14 1357    SLP SHORT TERM GOAL #1   Title pt will perform HEP with rare min A   Time 1   Period --  visit   Status Not Met   SLP SHORT TERM GOAL #2   Title pt will tell SLP why he is completing HEP with modified independence   Time 1   Period --  visit   Status Partially Met          SLP Long Term Goals - 10/09/14 1356    SLP LONG TERM GOAL #1   Title pt will complete HEP with modified independence   Time 1   Period --  visits   Status New   SLP LONG TERM GOAL #2   Title pt wil tell SLP why a food journal can be helpful when transitioning back to full PO diet.   Time 1   Period --  visits   Status New   SLP LONG TERM GOAL #3   Title pt will tell SLP 3 s/s aspiration PNA with modified independence   Time 1   Period --  visits   Status New          Plan - 10/09/14 1402    Clinical Impression Statement Presents with swallowing appearedly [facility] today, however, mistakenly has not begun HEP until today. Skilled ST to assess success with HEP and safety with POs.   Speech Therapy Frequency --  approx every 4 weeks   Duration --  until May 6th, 2016   Treatment/Interventions Pharyngeal strengthening exercises;Oral motor exercises;Patient/family education;SLP instruction and feedback   Potential to Achieve Goals Good        Problem List Patient Active Problem List   Diagnosis Date Noted  . 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 Abnormal weight loss 05/31/2014  . Hemoptysis 05/31/2014  . Cancer of the lip, oral cavity, and pharynx 05/30/2014  . CLL (chronic lymphocytic leukemia) 07/07/2012  . ALLERGIC RHINITIS 02/09/2008  . 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 OSTEOARTHRITIS 06/09/2007  . LOW BACK PAIN 06/09/2007  . DIABETES MELLITUS, TYPE II 03/03/2007  . HYPERTENSION 03/03/2007  . GERD 03/03/2007  . BENIGN PROSTATIC  HYPERTROPHY 03/03/2007    Garald Balding, SLP 10/09/2014, 2:04 PM  [facility] [address] [place], [place], [address] Phone: [phone]   Fax:  [phone]

## 2014-10-09 NOTE — Progress Notes (Addendum)
   Weekly Management Note:  Outpatient    ICD-9-CM ICD-10-CM   1. Squamous cell carcinoma of retromolar trigone 145.6 J17.9 Basic metabolic panel    Current Dose:52 Gy  Projected Dose: 60 Gy   Narrative:  The patient presents for routine under treatment assessment.  CBCT/MVCT images/Port film x-rays were reviewed.   He rates his pain as a 3 on a scale of 0-10. intermittent, burning and tingling over Right neck  Pt complains of fatigue, weakness and loss of sleep.  Pt has had dysphagia for solids. Pt reports a regular diet plus nutritonal beverage given orally formula-ensure, 4 cans per day.   Pt reports having a bowel 2-3 times week.  Constipation handout given  Hasnt used lidocaine yet; denies pain from mucositis  Physical Findings:   Filed Vitals:   10/09/14 1159  BP: 89/58  Pulse: 75  Temp:   Resp:    Filed Weights   10/09/14 1158  Weight: 156 lb 9.6 oz (71.033 kg)   Vitals with BMI 10/09/2014 10/09/2014  Height    Weight  156 lbs 10 oz  BMI    Systolic 89 96  Diastolic 58 61  Pulse 75 69  Respirations  12   + oral thrush . There is more patchy mucositis; mouth somewhat dry.  Skin over right face erythematous and  dry  CMP     Component Value Date/Time   NA 142 07/19/2013 1136   NA 143 01/07/2012 0936   K 4.3 07/19/2013 1136   K 4.1 01/07/2012 0936   CL 103 01/07/2012 0936   CO2 31* 07/19/2013 1136   CO2 29 01/07/2012 0936   GLUCOSE 127 07/19/2013 1136   GLUCOSE 98 01/07/2012 0936   BUN 14.0 07/19/2013 1136   BUN 16 01/07/2012 0936   CREATININE 0.9 07/19/2013 1136   CREATININE 1.0 01/07/2012 0936   CALCIUM 9.4 07/19/2013 1136   CALCIUM 9.3 01/07/2012 0936   PROT 6.9 07/19/2013 1136   PROT 7.2 01/07/2012 0936   ALBUMIN 3.7 07/19/2013 1136   ALBUMIN 4.0 01/07/2012 0936   AST 42* 07/19/2013 1136   AST 47* 01/07/2012 0936   ALT 27 07/19/2013 1136   ALT 28 01/07/2012 0936   ALKPHOS 73 07/19/2013 1136   ALKPHOS 59 01/07/2012 0936   BILITOT 0.92 07/19/2013  1136   BILITOT 1.0 01/07/2012 0936   GFRNONAA 81.57 12/12/2009 0000   GFRAA 121 08/09/2008 1103     Impression:  The patient is tolerating radiotherapy.   Plan:  Continue radiotherapy as planned. Biafine for dry skin Lidocaine for future mucositis prn Baking soda gargles Advised to continue holding Lotensin and to hold Metoprolol only if PCP advised him to.  He will call his PCP for advice upon reviewing his vitals.  F/u w/ me in 1 week. Encouraged better nutrition, continued hydration BMP tomorrow. Fluconazole for thrush   -----------------------------------  Eppie Gibson, MD

## 2014-10-09 NOTE — Progress Notes (Signed)
To provide support and encouragement, care continuity and to assess for needs, met with patient prior to his Tomo tmt.  He was accompanied by his SO Catherine. 1. He reported he is tolerating procedures well, looking forward to final tmt next Friday. 2. I re-educated him on:  Applying Biafine 2-3 times daily.  Protecting from the sun when outdoors, particularly areas of neck receiving tmt.  Encouraging increased activity to build stamina. 3. They verbalized understanding of guidance provided.  Rick Diehl, RN, BSN, CHPN Head & Neck Oncology Navigator [facility] [place] [phone]   

## 2014-10-09 NOTE — Addendum Note (Signed)
Encounter addended by: Jenene Slicker, RN on: 10/09/2014  5:46 PM<BR>     Documentation filed: Orders, [facility]x Association, Inpatient [facility]

## 2014-10-09 NOTE — Patient Instructions (Signed)
Begin working with the exercises ASAP.  Attempt 3 trials of the Masako. If largely unsuccessful do another exercise and come back to it and try another 3 reps. Do this until you've attempted 15 reps. If successful, do as many as you can prior to moving on to another exercise.  Take small sips/bites, use a sip of liquid following each bite

## 2014-10-10 ENCOUNTER — Ambulatory Visit
Admission: RE | Admit: 2014-10-10 | Discharge: 2014-10-10 | Disposition: A | Discharge status: Home / Self Care | Payer: Medicare Other | Source: Ambulatory Visit | Attending: Radiation Oncology | Admitting: Radiation Oncology

## 2014-10-10 ENCOUNTER — Telehealth: Payer: Self-pay | Admitting: Radiation Oncology

## 2014-10-10 ENCOUNTER — Telehealth: Payer: Self-pay | Admitting: Internal Medicine

## 2014-10-10 ENCOUNTER — Ambulatory Visit: Payer: Medicare Other | Admitting: Nutrition

## 2014-10-10 DIAGNOSIS — C062 Malignant neoplasm of retromolar area: Secondary | ICD-10-CM | POA: Diagnosis present

## 2014-10-10 LAB — BASIC METABOLIC PANEL (CC13)
Anion Gap: 10 mEq/L (ref 3–11)
BUN: 12.4 mg/dL (ref 7.0–26.0)
CO2: 26 mEq/L (ref 22–29)
Calcium: 9.3 mg/dL (ref 8.4–10.4)
Chloride: 104 mEq/L (ref 98–109)
Creatinine: 0.8 mg/dL (ref 0.7–1.3)
EGFR: 85 mL/min/{1.73_m2} — AB (ref >90)
Glucose: 130 mg/dl (ref 70–140)
Potassium: 4.4 mEq/L (ref 3.5–5.1)
Sodium: 140 mEq/L (ref 136–145)

## 2014-10-10 NOTE — Telephone Encounter (Signed)
Per Dr. Pearlie Oyster order phoned patient with normal lab results. Explained that he doesn't require any IV fluids this week. Patient committed to pushing fluids and contacting rad onc staff nurses with future needs.

## 2014-10-10 NOTE — Progress Notes (Signed)
Follow-up completed with patient prior to radiation therapy for tonsil cancer. Patient continues to have difficulty chewing and swallowing.  This is worse than before. Patient is consuming one to 2 oral nutrition supplements which is decreased from 3-4 last visit. Weight has decreased and was documented as 156.6 pounds April 18, down from 160.2 pounds April 11. Final treatment is Friday, April 22.  Nutrition diagnosis: Food and nutrition related knowledge deficit improved.  Intervention:  Patient educated on the importance of increasing oral intake to a minimum of 4 oral nutrition supplements daily. Explained importance of adequate calories and protein to minimize weight loss and promote healing. Questions were answered.  Teach back method used.  Monitoring, evaluation, goals:  Patient has decreased oral intake and has shown a 3-1/2 pound weight loss.   He will work to increase oral intake to promote weight maintenance.  Next visit: Patient will contact me with questions or concerns.  **Disclaimer: This note was dictated with voice recognition software. Similar sounding words can inadvertently be transcribed and this note may contain transcription errors which may not have been corrected upon publication of note.**

## 2014-10-10 NOTE — Telephone Encounter (Signed)
Pt called to say that he is taking radiation and that his bp is very low. Dr Isidore Moos the radiation specialist has ask that he called his pcp and ask if it will be ok if he goes off his bp medicine

## 2014-10-10 NOTE — Telephone Encounter (Signed)
Can hold benazepril-hctz Continue metoprolol for now Update [place] if BP <110/60  If BP goes above 150/90 update [place] as well as may restart at 1/2 tablet

## 2014-10-10 NOTE — Telephone Encounter (Signed)
-----   Message from Eppie Gibson, MD sent at 10/10/2014 12:41 PM EDT ----- Will someone please call pt to let him know labs are okay and he doesn't need IV fluids this week? Please encourage him to continue pushing PO intake. Thanks! ----- Message -----    From: [facility] Interface    Sent: 10/10/2014  12:31 PM      To: Eppie Gibson, MD

## 2014-10-10 NOTE — Telephone Encounter (Signed)
Dr. Hunter, please see message and advise. 

## 2014-10-11 ENCOUNTER — Ambulatory Visit
Admission: RE | Admit: 2014-10-11 | Discharge: 2014-10-11 | Disposition: A | Discharge status: Home / Self Care | Payer: Medicare Other | Source: Ambulatory Visit | Attending: Radiation Oncology | Admitting: Radiation Oncology

## 2014-10-11 DIAGNOSIS — C062 Malignant neoplasm of retromolar area: Secondary | ICD-10-CM | POA: Diagnosis not present

## 2014-10-11 NOTE — Telephone Encounter (Signed)
Spoke to pt, told him I had Dr. Yong Channel review his chart due to Dr.K is out and he said can hold benazepril-hctz continue metoprolol for now Update [place] if BP <110/60 and if BP goes above 150/90 update [place] as well as may restart at 1/2 tablet. Pt verbalized understanding and stated BP has been 92/50 last checked. Told him I will see if he wants you to decrease Metoprolol as well and get back to you. Pt verbalized understanding.

## 2014-10-11 NOTE — Telephone Encounter (Signed)
Left message on voicemail to call office.  

## 2014-10-11 NOTE — Telephone Encounter (Signed)
Discussed with Dr.Hunter that pt's said his blood pressure has been running low at 92/50 do you still want pt to take a whole tablet of Metoprolol 100 mg.  Dr, Yong Channel said to decrease Metoprolol to half table 50 mg daily and continue to monitor blood pressure.

## 2014-10-11 NOTE — Telephone Encounter (Signed)
Pt called back, told him Dr. Yong Channel said to decrease Metoprolol to 1/2 tablet daily 50 mg and continue to monitor blood pressure if lower than 110/60 to call office. Pt verbalized understanding.

## 2014-10-12 ENCOUNTER — Ambulatory Visit
Admission: RE | Admit: 2014-10-12 | Discharge: 2014-10-12 | Disposition: A | Discharge status: Home / Self Care | Payer: Medicare Other | Source: Ambulatory Visit | Attending: Radiation Oncology | Admitting: Radiation Oncology

## 2014-10-12 DIAGNOSIS — C062 Malignant neoplasm of retromolar area: Secondary | ICD-10-CM | POA: Diagnosis not present

## 2014-10-13 ENCOUNTER — Encounter: Payer: Self-pay | Admitting: Radiation Oncology

## 2014-10-13 ENCOUNTER — Encounter: Payer: Self-pay | Admitting: *Deleted

## 2014-10-13 ENCOUNTER — Ambulatory Visit
Admission: RE | Admit: 2014-10-13 | Discharge: 2014-10-13 | Disposition: A | Discharge status: Home / Self Care | Payer: Medicare Other | Source: Ambulatory Visit | Attending: Radiation Oncology | Admitting: Radiation Oncology

## 2014-10-13 VITALS — BP 135/69 | HR 59 | Temp 97.8°F

## 2014-10-13 DIAGNOSIS — C062 Malignant neoplasm of retromolar area: Secondary | ICD-10-CM | POA: Insufficient documentation

## 2014-10-13 DIAGNOSIS — C148 Malignant neoplasm of overlapping sites of lip, oral cavity and pharynx: Secondary | ICD-10-CM

## 2014-10-13 MED ORDER — BIAFINE EX EMUL
CUTANEOUS | Status: DC | PRN
  Administered 2014-10-13: 16:00:00 via TOPICAL

## 2014-10-13 MED ORDER — SILVER SULFADIAZINE 1 % EX CREA
TOPICAL_CREAM | Freq: Two times a day (BID) | CUTANEOUS | Status: DC
  Administered 2014-10-13: 16:00:00 via TOPICAL

## 2014-10-13 NOTE — Progress Notes (Signed)
Timothy Kim completes radiation therapy to his right neck ( 30 fractions).  He now has a moist desquamation and is seeking intervention today.  Grades pain in this area as a level 4/10.

## 2014-10-13 NOTE — Progress Notes (Signed)
Department of Radiation Oncology  Phone:  [phone] Fax:        [phone]  Weekly Treatment Note    Name: Timothy Kim Date: 10/13/2014 MRN: [id number] DOB: 09-10-1929   Current dose: 60 Gy  Current fraction: 30   MEDICATIONS: Current Outpatient Prescriptions  Medication Sig Dispense Refill  . aspirin 325 MG tablet Take 325 mg by mouth.    . brimonidine-timolol (COMBIGAN) 0.2-0.5 % ophthalmic solution Apply 1 drop to eye.    Marland Kitchen DHEA 50 MG TABS Take 1 tablet by mouth daily.    Marland Kitchen emollient (BIAFINE) cream Apply topically 2 (two) times daily.    . fluconazole (DIFLUCAN) 100 MG tablet Take 2 tablets today, then 1 tablet daily x 13 more days 15 tablet 0  . Krill Oil 300 MG CAPS Take 300 mg by mouth daily.    Marland Kitchen latanoprost (XALATAN) 0.005 % ophthalmic solution   4  . lidocaine (XYLOCAINE) 2 % solution Patient: Mix 1part 2% viscous lidocaine, 1part H20. Swish and/or swallow 51mL of this mixture, 78min before meals and at bedtime, up to QID. 100 mL 5  . Lutein-Zeaxanthin 15-0.7 MG CAPS Take 1 tablet by mouth daily.    . sodium chloride (OCEAN) 0.65 % nasal spray Place 2 sprays into the nose.    . sodium fluoride (PREVIDENT 5000 PLUS) 1.1 % CREA dental cream Apply thin ribbon of cream to tooth brush. Brush teeth for 2 minutes. Spit out excess. Repeat nightly. 1 Tube prn  . tadalafil (CIALIS) 20 MG tablet Take 1 tablet (20 mg total) by mouth daily as needed for erectile dysfunction. 10 tablet 3  . triamcinolone cream (KENALOG) 0.1 % APPLY TO AFFECTED AREA TWICE DAILY 80 g 1  . zolpidem (AMBIEN) 5 MG tablet TAKE 1 TABLET AT BEDTIME AS NEEDED FOR SLEEP 30 tablet 2  . benazepril-hydrochlorthiazide (LOTENSIN HCT) 20-12.5 MG per tablet TAKE 1 TABLET EVERY DAY (Patient not taking: Reported on 10/13/2014) 90 tablet 3  . metoprolol succinate (TOPROL-XL) 100 MG 24 hr tablet TAKE 1 TABLET IMMEDIATELY FOLLOWING A MEAL (Patient taking differently: TAKE 1 TABLET IMMEDIATELY FOLLOWING A MEAL.   Currently taking 50mg  tab per his PC) 90 tablet 3  . sildenafil (VIAGRA) 100 MG tablet Take 1 tablet (100 mg total) by mouth as needed for erectile dysfunction. (Patient not taking: Reported on 06/07/2014) 3 tablet 6   No current facility-administered medications for this encounter.     ALLERGIES: Review of patient's allergies indicates no known allergies.   LABORATORY DATA:  Lab Results  Component Value Date   WBC 11.6* 07/19/2013   HGB 14.2 07/19/2013   HCT 42.4 07/19/2013   MCV 105.5* 07/19/2013   PLT 184 07/19/2013   Lab Results  Component Value Date   NA 140 10/10/2014   K 4.4 10/10/2014   CL 103 01/07/2012   CO2 26 10/10/2014   Lab Results  Component Value Date   ALT 27 07/19/2013   AST 42* 07/19/2013   ALKPHOS 73 07/19/2013   BILITOT 0.92 07/19/2013     NARRATIVE: Timothy Kim was seen today for weekly treatment management. The chart was checked and the patient's films were reviewed.  The patient finished his final fraction today. He asked to be seen haven't increased skin reaction in the right neck. Otherwise he has been doing well.  PHYSICAL EXAMINATION: temperature is 97.8 F (36.6 C). His blood pressure is 135/69 and his pulse is 59.      the patient has an area  of moist desquamation with a small degree of bleeding in the right neck.  ASSESSMENT: The patient is doing satisfactorily with treatment. He finished today. I believe that he would benefit from Silvadene and this was given to him today.  PLAN: The patient is scheduled to see Dr. Isidore Moos early next week.

## 2014-10-13 NOTE — Progress Notes (Signed)
  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     Plan: The patient has completed radiation treatment. The patient will return to radiation oncology clinic for routine followup in a few days. I advised them to call or return sooner if they have any questions or concerns related to their recovery or treatment.  -----------------------------------  Eppie Gibson, MD

## 2014-10-13 NOTE — Progress Notes (Signed)
Met with pt after his final RT to offer support and to celebrate end of radiation treatment.  He was accompanied by his SO, Belenda Cruise. 1. I presented Belenda Cruise with a Certificate of Recognition. 2. I explained that my role as navigator will continue for several more months and that I will be calling and/or joining them during follow-up visits.   3. I encouraged them to call me with questions/concerns. 4. They verbalized understanding, expressed appreciation for my navigation.  Gayleen Orem, RN, BSN, [facility] [facility] [phone]

## 2014-10-16 ENCOUNTER — Encounter: Payer: Self-pay | Admitting: Radiation Oncology

## 2014-10-16 ENCOUNTER — Ambulatory Visit: Payer: Medicare Other

## 2014-10-16 ENCOUNTER — Ambulatory Visit
Admission: RE | Admit: 2014-10-16 | Discharge: 2014-10-16 | Disposition: A | Discharge status: Home / Self Care | Payer: Medicare Other | Source: Ambulatory Visit | Attending: Radiation Oncology | Admitting: Radiation Oncology

## 2014-10-16 VITALS — BP 112/61 | HR 76 | Temp 97.8°F | Resp 12 | Wt 153.5 lb

## 2014-10-16 DIAGNOSIS — C062 Malignant neoplasm of retromolar area: Secondary | ICD-10-CM

## 2014-10-16 NOTE — Progress Notes (Addendum)
He is currently in no pain.  Pt complains of fatigue, loss of sleep and poor appetite.  Pt has had dysphagia for solids. Pt reports a soft regular diet plus nutritonal beverage given orally formula-Ensure,  Attempting 4 cans per day. Oral exam reveals mild erythema with yellow and tenacious sputum. Skin exam reveals Pruritus, moist desquamation and erythema. Reports using Silvadene Pt reports a soft bowel movement every 3 days. Taking Miralax and stool softeners which is helping  Wt Readings from Last 3 Encounters:  10/16/14 153 lb 8 oz (69.627 kg)  09/25/14 159 lb 11.2 oz (72.439 kg)  06/07/14 166 lb (75.297 kg)  BP 112/61 mmHg  Pulse 76  Temp(Src) 97.8 F (36.6 C) (Oral)  Resp 12  Wt 153 lb 8 oz (69.627 kg)  SpO2 98% Orthostatic Standing Vital Signs: BP:112/61 P:76 Pox:98  Cleansed right neck with normal saline and pat dried.  Noted a large amount of Silvadene applied to area of irritation.

## 2014-10-16 NOTE — Progress Notes (Signed)
Radiation Oncology         [phone] ________________________________  Name: Timothy Kim MRN: [id number]  Date: 10/16/2014  DOB: 10-07-1929  Follow-Up Visit Note  CC: [name], MD  Philomena Doheny, MD  Diagnosis and Prior Radiotherapy:       ICD-9-CM ICD-10-CM   1. Squamous cell carcinoma of retromolar trigone 145.6 C06.2      Narrative:  The patient returns today for routine follow-up. Pt has taken silvadene since last week. He lost 3 lbs, he is not orthostatic today. Pt has adjusted blood pressure medication according to PCP. Pt notes cont'd sinus congestion and will discuss with ENT. Pt continues to drink whole milk to help maintain caloric intake.              ALLERGIES:  has No Known Allergies.  Meds: Current Outpatient Prescriptions  Medication Sig Dispense Refill  . aspirin 325 MG tablet Take 325 mg by mouth.    . benazepril-hydrochlorthiazide (LOTENSIN HCT) 20-12.5 MG per tablet TAKE 1 TABLET EVERY DAY (Patient not taking: Reported on 10/13/2014) 90 tablet 3  . brimonidine-timolol (COMBIGAN) 0.2-0.5 % ophthalmic solution Apply 1 drop to eye.    Marland Kitchen DHEA 50 MG TABS Take 1 tablet by mouth daily.    Marland Kitchen emollient (BIAFINE) cream Apply topically 2 (two) times daily.    . fluconazole (DIFLUCAN) 100 MG tablet Take 2 tablets today, then 1 tablet daily x 13 more days 15 tablet 0  . Krill Oil 300 MG CAPS Take 300 mg by mouth daily.    Marland Kitchen latanoprost (XALATAN) 0.005 % ophthalmic solution   4  . lidocaine (XYLOCAINE) 2 % solution Patient: Mix 1part 2% viscous lidocaine, 1part H20. Swish and/or swallow 36mL of this mixture, 20min before meals and at bedtime, up to QID. 100 mL 5  . Lutein-Zeaxanthin 15-0.7 MG CAPS Take 1 tablet by mouth daily.    . metoprolol succinate (TOPROL-XL) 100 MG 24 hr tablet TAKE 1 TABLET IMMEDIATELY FOLLOWING A MEAL (Patient taking differently: TAKE 1 TABLET IMMEDIATELY FOLLOWING A MEAL.  Currently taking 50mg  tab per his PC) 90 tablet 3  .  sildenafil (VIAGRA) 100 MG tablet Take 1 tablet (100 mg total) by mouth as needed for erectile dysfunction. (Patient not taking: Reported on 06/07/2014) 3 tablet 6  . sodium chloride (OCEAN) 0.65 % nasal spray Place 2 sprays into the nose.    . sodium fluoride (PREVIDENT 5000 PLUS) 1.1 % CREA dental cream Apply thin ribbon of cream to tooth brush. Brush teeth for 2 minutes. Spit out excess. Repeat nightly. 1 Tube prn  . tadalafil (CIALIS) 20 MG tablet Take 1 tablet (20 mg total) by mouth daily as needed for erectile dysfunction. 10 tablet 3  . triamcinolone cream (KENALOG) 0.1 % APPLY TO AFFECTED AREA TWICE DAILY 80 g 1  . zolpidem (AMBIEN) 5 MG tablet TAKE 1 TABLET AT BEDTIME AS NEEDED FOR SLEEP 30 tablet 2   No current facility-administered medications for this encounter.    Physical Findings: The patient is in no acute distress. Patient is alert and oriented. Wt Readings from Last 3 Encounters:  10/16/14 153 lb 8 oz (69.627 kg)  09/25/14 159 lb 11.2 oz (72.439 kg)  06/07/14 166 lb (75.297 kg)    weight is 153 lb 8 oz (69.627 kg). His oral temperature is 97.8 F (36.6 C). His blood pressure is 112/61 and his pulse is 76. His respiration is 12 and oxygen saturation is 98%. .   Oral  cavity shows confluent mucositis with no thrush Moist desquamation over right neck   Lab Findings: Lab Results  Component Value Date   WBC 11.6* 07/19/2013   HGB 14.2 07/19/2013   HCT 42.4 07/19/2013   MCV 105.5* 07/19/2013   PLT 184 07/19/2013    Lab Results  Component Value Date   TSH 1.315 06/07/2014    Radiographic Findings: No results found.  Impression/Plan:    1) Head and Neck Cancer Status: healing from RT  2) Nutritional Status: - weight: Minor weight loss ( ~ 3lbs) - PEG tube: none  3) Risk Factors: The patient has been educated about risk factors including alcohol and tobacco abuse; they understand that avoidance of alcohol and tobacco is important to prevent recurrences as well  as other cancers  4) Swallowing: No issues noted  5) Dental: Encouraged to continue regular followup with dentistry, and dental hygiene including fluoride rinses.    6) Thyroid function:  Lab Results  Component Value Date   TSH 1.315 06/07/2014    7) Social: No active social issues to address at this time    8) Other: Recommended soups with cream base (half and half) for added calories and protein.   9) Follow-up scheduled in 2-3 weeks. Continue silvadene until skin is no longer moist.The patient was encouraged to call with any issues or questions before then.   _____________________________   Eppie Gibson, MD   This document serves as a record of services personally performed by Eppie Gibson, MD. It was created on her behalf by Derek Mound, a trained medical scribe. The creation of this record is based on the scribe's personal observations and the provider's statements to them. This document has been checked and approved by the attending provider.

## 2014-10-17 ENCOUNTER — Other Ambulatory Visit (HOSPITAL_BASED_OUTPATIENT_CLINIC_OR_DEPARTMENT_OTHER): Payer: Medicare Other

## 2014-10-17 ENCOUNTER — Telehealth: Payer: Self-pay | Admitting: Hematology and Oncology

## 2014-10-17 ENCOUNTER — Encounter: Payer: Self-pay | Admitting: *Deleted

## 2014-10-17 ENCOUNTER — Ambulatory Visit: Payer: Medicare Other

## 2014-10-17 ENCOUNTER — Encounter: Payer: Self-pay | Admitting: Hematology and Oncology

## 2014-10-17 ENCOUNTER — Ambulatory Visit (HOSPITAL_BASED_OUTPATIENT_CLINIC_OR_DEPARTMENT_OTHER): Payer: Medicare Other | Admitting: Hematology and Oncology

## 2014-10-17 VITALS — BP 136/70 | HR 65 | Temp 97.7°F | Resp 18 | Ht 65.0 in | Wt 150.1 lb

## 2014-10-17 DIAGNOSIS — C9110 Chronic lymphocytic leukemia of B-cell type not having achieved remission: Secondary | ICD-10-CM

## 2014-10-17 DIAGNOSIS — D630 Anemia in neoplastic disease: Secondary | ICD-10-CM | POA: Diagnosis not present

## 2014-10-17 DIAGNOSIS — C148 Malignant neoplasm of overlapping sites of lip, oral cavity and pharynx: Secondary | ICD-10-CM | POA: Diagnosis not present

## 2014-10-17 DIAGNOSIS — L589 Radiodermatitis, unspecified: Secondary | ICD-10-CM | POA: Diagnosis not present

## 2014-10-17 LAB — COMPREHENSIVE METABOLIC PANEL (CC13)
ALT: 12 U/L (ref 0–55)
AST: 18 U/L (ref 5–34)
Albumin: 3 g/dL — ABNORMAL LOW (ref 3.5–5.0)
Alkaline Phosphatase: 84 U/L (ref 40–150)
Anion Gap: 12 mEq/L — ABNORMAL HIGH (ref 3–11)
BUN: 12.7 mg/dL (ref 7.0–26.0)
CHLORIDE: 102 meq/L (ref 98–109)
CO2: 26 mEq/L (ref 22–29)
Calcium: 9.4 mg/dL (ref 8.4–10.4)
Creatinine: 0.8 mg/dL (ref 0.7–1.3)
EGFR: 83 mL/min/{1.73_m2} — AB (ref >90)
Glucose: 137 mg/dl (ref 70–140)
POTASSIUM: 4.2 meq/L (ref 3.5–5.1)
SODIUM: 140 meq/L (ref 136–145)
TOTAL PROTEIN: 6.1 g/dL — AB (ref 6.4–8.3)
Total Bilirubin: 0.58 mg/dL (ref 0.20–1.20)

## 2014-10-17 LAB — CBC WITH DIFFERENTIAL/PLATELET
BASO%: 0.6 % (ref 0.0–2.0)
BASOS ABS: 0 10*3/uL (ref 0.0–0.1)
EOS%: 2.5 % (ref 0.0–7.0)
Eosinophils Absolute: 0.1 10*3/uL (ref 0.0–0.5)
HEMATOCRIT: 33.5 % — AB (ref 38.4–49.9)
HGB: 10.9 g/dL — ABNORMAL LOW (ref 13.0–17.1)
LYMPH#: 1.5 10*3/uL (ref 0.9–3.3)
LYMPH%: 30.6 % (ref 14.0–49.0)
MCH: 31.1 pg (ref 27.2–33.4)
MCHC: 32.5 g/dL (ref 32.0–36.0)
MCV: 95.7 fL (ref 79.3–98.0)
MONO#: 0.7 10*3/uL (ref 0.1–0.9)
MONO%: 14.3 % — ABNORMAL HIGH (ref 0.0–14.0)
NEUT#: 2.5 10*3/uL (ref 1.5–6.5)
NEUT%: 52 % (ref 39.0–75.0)
PLATELETS: 218 10*3/uL (ref 140–400)
RBC: 3.5 10*6/uL — ABNORMAL LOW (ref 4.20–5.82)
RDW: 13.6 % (ref 11.0–14.6)
WBC: 4.7 10*3/uL (ref 4.0–10.3)

## 2014-10-17 LAB — LACTATE DEHYDROGENASE (CC13): LDH: 165 U/L (ref 125–245)

## 2014-10-17 NOTE — Progress Notes (Signed)
[facility] OFFICE PROGRESS NOTE  Patient Care Team: Marletta Lor, MD as PCP - General Jodi Marble, MD as Consulting Physician (Otolaryngology) Heath Lark, MD as Consulting Physician (Hematology and Oncology) Leota Sauers, RN as Oncology Nurse [place], RD as Dietitian (Nutrition) Eppie Gibson, MD as Attending Physician (Radiation Oncology)  SUMMARY OF ONCOLOGIC HISTORY: Oncology History   Oral cancer   Staging form: Lip and Oral Cavity, AJCC 7th Edition     Clinical: Stage IVA (T4a, N0, M0) - Signed by Heath Lark, MD on 06/07/2014       Cancer of the lip, oral cavity, and pharynx   05/24/2014 Surgery Flexible fiberoptic laryngoscope be confirmed asymmetric swelling with superficial ulceration at the right tonsil/retromolar mass. Biopsy was performed on the right tonsil   05/24/2014 Pathology Results Accession: [id number] right tonsil biopsy confirmed squamous cell carcinoma.   06/05/2014 Imaging CT scan showed retromolar mass invading into the alveolar ridge   06/06/2014 Imaging PET CT showed no evidence of metastatic disease   07/24/2014 Surgery He had extensive surgery at [facility] including Tracheotomy, R SND (levels 1-3), right infrastructure maxillectomy with right rim mandibulectomy, dental extractions, right parascapular fasciocutaneous free flap and LN dissection.   09/04/2014 - 10/13/2014 Radiation Therapy He received radiation treatment   His CLL diagnosis was picked up in 2014 after presentation with mild leukocytosis with absolute lymphocytosis for about 5 years. Total white count began to rise in February 2010 from previous value of 8000 in December 2008 up to 10,000 in February 2010. Peak white count recorded as 14,000 in January 2014 without associated anemia or thrombocytopenia.  In January 2014, he had axillary lymph nodes  Palpable up to about 2 cm. No splenomegaly. No evidence for any paraneoplastic hemolytic anemia with normal  LDH and reticulocyte count. Coombs negative. He has normal serum immunoglobulins and no monoclonal proteins on IFE. He was being observed.  PET CT scan from December 2015 show no evidence of active lymphadenopathy apart from areas around his mouth  INTERVAL HISTORY: Please see below for problem oriented charting. He returns today as part of his follow-up related to CLL and recent diagnosis of oropharyngeal cancer He has completed his radiation treatment. His treatment was complicated by minor skin breakdown around his neck. He is able to swallow his food and denies significant weight loss. Denies recent infection. He has minor skin oozing around the skin injury site but denies bleeding elsewhere. His aspirin was placed on hold.  REVIEW OF SYSTEMS:   Constitutional: Denies fevers, chills or abnormal weight loss Eyes: Denies blurriness of vision Ears, nose, mouth, throat, and face: Denies mucositis or sore throat Respiratory: Denies cough, dyspnea or wheezes Cardiovascular: Denies palpitation, chest discomfort or lower extremity swelling Gastrointestinal:  Denies nausea, heartburn or change in bowel habits Lymphatics: Denies new lymphadenopathy or easy bruising Neurological:Denies numbness, tingling or new weaknesses Behavioral/Psych: Mood is stable, no new changes  All other systems were reviewed with the patient and are negative.  I have reviewed the past medical history, past surgical history, social history and family history with the patient and they are unchanged from previous note.  ALLERGIES:  has No Known Allergies.  MEDICATIONS:  Current Outpatient Prescriptions  Medication Sig Dispense Refill  . aspirin 325 MG tablet Take 325 mg by mouth.    . benazepril-hydrochlorthiazide (LOTENSIN HCT) 20-12.5 MG per tablet TAKE 1 TABLET EVERY DAY (Patient not taking: Reported on 10/13/2014) 90 tablet 3  . brimonidine-timolol (COMBIGAN)  0.2-0.5 % ophthalmic solution Apply 1 drop to eye.     Marland Kitchen DHEA 50 MG TABS Take 1 tablet by mouth daily.    Marland Kitchen emollient (BIAFINE) cream Apply topically 2 (two) times daily.    . fluconazole (DIFLUCAN) 100 MG tablet Take 2 tablets today, then 1 tablet daily x 13 more days 15 tablet 0  . Krill Oil 300 MG CAPS Take 300 mg by mouth daily.    Marland Kitchen latanoprost (XALATAN) 0.005 % ophthalmic solution   4  . lidocaine (XYLOCAINE) 2 % solution Patient: Mix 1part 2% viscous lidocaine, 1part H20. Swish and/or swallow 26mL of this mixture, 42min before meals and at bedtime, up to QID. 100 mL 5  . Lutein-Zeaxanthin 15-0.7 MG CAPS Take 1 tablet by mouth daily.    . metoprolol succinate (TOPROL-XL) 100 MG 24 hr tablet TAKE 1 TABLET IMMEDIATELY FOLLOWING A MEAL (Patient taking differently: TAKE 1 TABLET IMMEDIATELY FOLLOWING A MEAL.  Currently taking 50mg  tab per his PC) 90 tablet 3  . sildenafil (VIAGRA) 100 MG tablet Take 1 tablet (100 mg total) by mouth as needed for erectile dysfunction. (Patient not taking: Reported on 06/07/2014) 3 tablet 6  . sodium chloride (OCEAN) 0.65 % nasal spray Place 2 sprays into the nose.    . sodium fluoride (PREVIDENT 5000 PLUS) 1.1 % CREA dental cream Apply thin ribbon of cream to tooth brush. Brush teeth for 2 minutes. Spit out excess. Repeat nightly. 1 Tube prn  . tadalafil (CIALIS) 20 MG tablet Take 1 tablet (20 mg total) by mouth daily as needed for erectile dysfunction. 10 tablet 3  . triamcinolone cream (KENALOG) 0.1 % APPLY TO AFFECTED AREA TWICE DAILY 80 g 1  . zolpidem (AMBIEN) 5 MG tablet TAKE 1 TABLET AT BEDTIME AS NEEDED FOR SLEEP 30 tablet 2   No current facility-administered medications for this visit.    PHYSICAL EXAMINATION: ECOG PERFORMANCE STATUS: 1 - Symptomatic but completely ambulatory  Filed Vitals:   10/17/14 1329  BP: 136/70  Pulse: 65  Temp: 97.7 F (36.5 C)  Resp: 18   Filed Weights   10/17/14 1329  Weight: 150 lb 1.6 oz (68.085 kg)    GENERAL:alert, no distress and comfortable SKIN:  Noticed  skin injury around his neck. EYES: normal, Conjunctiva are pink and non-injected, sclera clear OROPHARYNX: significant postsurgical changes. Musculoskeletal:no cyanosis of digits and no clubbing  NEURO: alert & oriented x 3 with fluent speech, no focal motor/sensory deficits  LABORATORY DATA:  I have reviewed the data as listed    Component Value Date/Time   NA 140 10/17/2014 1310   NA 143 01/07/2012 0936   K 4.2 10/17/2014 1310   K 4.1 01/07/2012 0936   CL 103 01/07/2012 0936   CO2 26 10/17/2014 1310   CO2 29 01/07/2012 0936   GLUCOSE 137 10/17/2014 1310   GLUCOSE 98 01/07/2012 0936   BUN 12.7 10/17/2014 1310   BUN 16 01/07/2012 0936   CREATININE 0.8 10/17/2014 1310   CREATININE 1.0 01/07/2012 0936   CALCIUM 9.4 10/17/2014 1310   CALCIUM 9.3 01/07/2012 0936   PROT 6.1* 10/17/2014 1310   PROT 7.2 01/07/2012 0936   ALBUMIN 3.0* 10/17/2014 1310   ALBUMIN 4.0 01/07/2012 0936   AST 18 10/17/2014 1310   AST 47* 01/07/2012 0936   ALT 12 10/17/2014 1310   ALT 28 01/07/2012 0936   ALKPHOS 84 10/17/2014 1310   ALKPHOS 59 01/07/2012 0936   BILITOT 0.58 10/17/2014 1310   BILITOT 1.0  01/07/2012 0936   GFRNONAA 81.57 12/12/2009 0000   GFRAA 121 08/09/2008 1103    No results found for: SPEP, UPEP  Lab Results  Component Value Date   WBC 4.7 10/17/2014   NEUTROABS 2.5 10/17/2014   HGB 10.9* 10/17/2014   HCT 33.5* 10/17/2014   MCV 95.7 10/17/2014   PLT 218 10/17/2014      Chemistry      Component Value Date/Time   NA 140 10/17/2014 1310   NA 143 01/07/2012 0936   K 4.2 10/17/2014 1310   K 4.1 01/07/2012 0936   CL 103 01/07/2012 0936   CO2 26 10/17/2014 1310   CO2 29 01/07/2012 0936   BUN 12.7 10/17/2014 1310   BUN 16 01/07/2012 0936   CREATININE 0.8 10/17/2014 1310   CREATININE 1.0 01/07/2012 0936      Component Value Date/Time   CALCIUM 9.4 10/17/2014 1310   CALCIUM 9.3 01/07/2012 0936   ALKPHOS 84 10/17/2014 1310   ALKPHOS 59 01/07/2012 0936   AST 18  10/17/2014 1310   AST 47* 01/07/2012 0936   ALT 12 10/17/2014 1310   ALT 28 01/07/2012 0936   BILITOT 0.58 10/17/2014 1310   BILITOT 1.0 01/07/2012 0936     ASSESSMENT & PLAN:  Cancer of the lip, oral cavity, and pharynx  He has completed radiation treatment, complicated by mild skin breakdown. Continue supportive care and I will defer for future follow-up to ENT and radiation oncology   CLL (chronic lymphocytic leukemia)  This is stable.  PET CT scan from December 2015 show no evidence of active disease He is not symptomatic. I will see him on a yearly basis.   Anemia in neoplastic disease This is likely anemia of chronic disease and related to recent surgery. He has minor bleeding from the skin breakdown around his neck. I recommend he continue to hold off taking aspirin until the area have formed a nice scab.   Radiation dermatitis  He has significant radiation-induced skin breakdown. Overall, his wife felt that it is improving. He is getting aggressive wound care.  I recommend he continue to same end hold off aspirin as above.    Orders Placed This Encounter  Procedures  . CBC with Differential/Platelet    Standing Status: Future     Number of Occurrences:      Standing Expiration Date: 11/21/2015  . Comprehensive metabolic panel    Standing Status: Future     Number of Occurrences:      Standing Expiration Date: 11/21/2015  . Lactate dehydrogenase    Standing Status: Future     Number of Occurrences:      Standing Expiration Date: 11/21/2015   All questions were answered. The patient knows to call the clinic with any problems, questions or concerns. No barriers to learning was detected. I spent 25 minutes counseling the patient face to face. The total time spent in the appointment was 30 minutes and more than 50% was on counseling and review of test results     [facility], [place], MD 10/17/2014 2:54 PM

## 2014-10-17 NOTE — Assessment & Plan Note (Signed)
This is stable.  PET CT scan from December 2015 show no evidence of active disease He is not symptomatic. I will see him on a yearly basis.

## 2014-10-17 NOTE — Assessment & Plan Note (Signed)
He has completed radiation treatment, complicated by mild skin breakdown. Continue supportive care and I will defer for future follow-up to ENT and radiation oncology

## 2014-10-17 NOTE — Assessment & Plan Note (Signed)
He has significant radiation-induced skin breakdown. Overall, his wife felt that it is improving. He is getting aggressive wound care.  I recommend he continue to same end hold off aspirin as above.

## 2014-10-17 NOTE — Assessment & Plan Note (Signed)
This is likely anemia of chronic disease and related to recent surgery. He has minor bleeding from the skin breakdown around his neck. I recommend he continue to hold off taking aspirin until the area have formed a nice scab.

## 2014-10-17 NOTE — Telephone Encounter (Signed)
Pt confirmed labs/ov per 04/26 POF, gave pt AVS and Calendar...  KJ °

## 2014-10-18 ENCOUNTER — Ambulatory Visit: Payer: Medicare Other

## 2014-10-18 ENCOUNTER — Other Ambulatory Visit: Payer: Self-pay | Admitting: Internal Medicine

## 2014-10-20 NOTE — Progress Notes (Signed)
To provide support and encouragement, care continuity and to assess for needs, met with patient during est pt apt with Dr. Alvy Bimler.  He was accompanied by his companion, Belenda Cruise. 1. We discussed his notable R neck desquamation.  He confirmed he is applying silvadene daily.  I encouraged him to continue applying Biafine to intact areas on neck. 2. I reinforced importance of sun protection when he is outdoors. 3. He denied compliance with BID swallowing exercises per guidance by Garald Balding, SLP.  He agreed to the goal of doing exercises at least once daily for the next several days, working up to BID next week. 4. They understand I can be contacted with with needs/concerns.  Gayleen Orem, RN, BSN, [place] [facility] [place] [phone]

## 2014-10-25 ENCOUNTER — Encounter: Payer: Self-pay | Admitting: Oncology

## 2014-10-27 ENCOUNTER — Ambulatory Visit
Admission: RE | Admit: 2014-10-27 | Discharge: 2014-10-27 | Disposition: A | Discharge status: Home / Self Care | Payer: Medicare Other | Source: Ambulatory Visit | Attending: Radiation Oncology | Admitting: Radiation Oncology

## 2014-10-27 ENCOUNTER — Encounter: Payer: Self-pay | Admitting: Radiation Oncology

## 2014-10-27 VITALS — BP 128/71 | HR 65 | Temp 98.2°F | Resp 16 | Ht 65.0 in | Wt 150.4 lb

## 2014-10-27 DIAGNOSIS — C062 Malignant neoplasm of retromolar area: Secondary | ICD-10-CM

## 2014-10-27 NOTE — Progress Notes (Signed)
Pain Status: none  Weight changes, if any: has lost 3 lbs since 10/16/14  Nutritional Status a) intake: eating thick soups, 2 ensures per day, V fusion juice, Carnation instant breakfast b) using a feeding tube?: no c) weight changes, if any: weight down 3 lbs since 10/16/14  Swallowing Status: no - reports his throat has not been sore for 5-6 days.  No longer taking lidocaine solution.  Dental (if applicable): When was last visit with dentistry has an appointment with Dr. Enrique Sack 11/22/14.  Last visit was 6-7 weeks ago  Using fluoride trays daily? no   When was last ENT visit? December 2015 When is next ENT visit? Does not have one   Other notable issues, if any: Reports an occasional dry mouth.  Denies mouth sores.  The skin on his right neck is red.  He is applying silvadene once a day and covering it with a telfa pad.  He reports the swelling in his right jaw seems to have gone down.  BP 128/71 mmHg  Pulse 65  Temp(Src) 98.2 F (36.8 C) (Oral)  Resp 16  Ht 5\' 5"  (1.651 m)  Wt 150 lb 6.4 oz (68.221 kg)  BMI 25.03 kg/m2  SpO2 100%   Wt Readings from Last 3 Encounters:  10/17/14 150 lb 1.6 oz (68.085 kg)  10/16/14 153 lb 8 oz (69.627 kg)  09/25/14 159 lb 11.2 oz (72.439 kg)

## 2014-10-27 NOTE — Progress Notes (Signed)
Radiation Oncology         [phone] ________________________________  Name: Timothy Kim MRN: [id number]  Date: 10/27/2014  DOB: [birth year]  Follow-Up Visit Note  CC: [name], MD  Philomena Doheny, MD  Diagnosis and Prior Radiotherapy:       ICD-9-CM ICD-10-CM   1. 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 Taste returning.  Pain Status: "none in my mouth or throat"  Weight changes, if any: has lost 3 lbs since 10/16/14   Nutritional Status  a) intake: eating thick soups, 2 ensures per day, V fusion juice, Carnation instant breakfast. Has difficulty chewing. b) using a feeding tube?: no    Swallowing Status: no - reports his throat has not been sore for 5-6 days. No longer taking lidocaine solution.  Dental (if applicable): When was last visit with dentistry has an appointment? With Dr. Enrique Sack 11/22/14. Last visit was 6-7 weeks ago Using fluoride trays daily? no  When was last ENT visit? December 2015 When is next ENT visit? Does not have one   Other notable issues, if any: Reports an occasional dry mouth. Denies mouth sores. The skin on his right neck is red. He is applying silvadene once a day and covering it with a telfa pad. He reports the swelling in his right jaw seems to have gone down.  Pt does not have an appointment scheduled with Dr. Vicie Mutters at this time.  ALLERGIES:  has No Known Allergies.  Meds: Current Outpatient Prescriptions  Medication Sig Dispense Refill  . brimonidine-timolol (COMBIGAN) 0.2-0.5 % ophthalmic solution Apply 1  drop to eye.    Marland Kitchen DHEA 50 MG TABS Take 1 tablet by mouth daily.    Javier Docker Oil 300 MG CAPS Take 300 mg by mouth daily.    Marland Kitchen latanoprost (XALATAN) 0.005 % ophthalmic solution   4  . Lutein-Zeaxanthin 15-0.7 MG CAPS Take 1 tablet by mouth daily.    . metoprolol succinate (TOPROL-XL) 100 MG 24 hr tablet TAKE 1 TABLET IMMEDIATELY FOLLOWING A MEAL (Patient taking differently: TAKE 1 TABLET IMMEDIATELY FOLLOWING A MEAL.  Currently taking 50mg  tab per his PC) 90 tablet 3  . sodium chloride (OCEAN) 0.65 % nasal spray Place 2 sprays into the nose.    . triamcinolone cream (KENALOG) 0.1 % APPLY TO AFFECTED AREA TWICE DAILY 80 g 1  . aspirin 325 MG tablet Take 325 mg by mouth.    . benazepril-hydrochlorthiazide (LOTENSIN HCT) 20-12.5 MG per tablet TAKE 1 TABLET EVERY DAY (Patient not taking: Reported on 10/13/2014) 90 tablet 3  . emollient (BIAFINE) cream Apply topically 2 (two) times daily.    . fluconazole (DIFLUCAN) 100 MG tablet Take 2 tablets today, then 1 tablet daily x 13 more days (Patient not taking: Reported on 10/27/2014) 15 tablet 0  . lidocaine (XYLOCAINE) 2 % solution Patient: Mix 1part 2% viscous lidocaine, 1part H20. Swish and/or swallow 40mL of this mixture, 50min before meals and at bedtime, up to QID. (Patient not taking: Reported on 10/27/2014) 100 mL 5  .  sildenafil (VIAGRA) 100 MG tablet Take 1 tablet (100 mg total) by mouth as needed for erectile dysfunction. (Patient not taking: Reported on 06/07/2014) 3 tablet 6  . sodium fluoride (PREVIDENT 5000 PLUS) 1.1 % CREA dental cream Apply thin ribbon of cream to tooth brush. Brush teeth for 2 minutes. Spit out excess. Repeat nightly. (Patient not taking: Reported on 10/27/2014) 1 Tube prn  . tadalafil (CIALIS) 20 MG tablet Take 1 tablet (20 mg total) by mouth daily as needed for erectile dysfunction. (Patient not taking: Reported on 10/27/2014) 10 tablet 3  . zolpidem (AMBIEN) 5 MG tablet TAKE 1 TABLET AT BEDTIME AS NEEDED FOR SLEEP (Patient not  taking: Reported on 10/27/2014) 30 tablet 2   No current facility-administered medications for this encounter.    Physical Findings: The patient is in no acute distress. Patient is alert and oriented. Wt Readings from Last 3 Encounters:  10/17/14 150 lb 1.6 oz (68.085 kg)  10/16/14 153 lb 8 oz (69.627 kg)  09/25/14 159 lb 11.2 oz (72.439 kg)    height is 5\' 5"  (1.651 m) and weight is 150 lb 6.4 oz (68.221 kg). His oral temperature is 98.2 F (36.8 C). His blood pressure is 128/71 and his pulse is 65. His respiration is 16 and oxygen saturation is 100%. .  The pt has scant bleeding in the right buccal mucosa where it meets the anterior tissue flap. This area is tender.  No thrush. Skin over neck - moist desquamation in a patch of low right neck, healing. No obvious adenopathy in neck'  Lab Findings: Lab Results  Component Value Date   WBC 4.7 10/17/2014   HGB 10.9* 10/17/2014   HCT 33.5* 10/17/2014   MCV 95.7 10/17/2014   PLT 218 10/17/2014    Lab Results  Component Value Date   TSH 1.315 06/07/2014    Radiographic Findings: No results found.  Impression/Plan:    1) Head and Neck Cancer Status: healing from RT  2) Nutritional Status: I will ask Dory Peru to contact patient - weight: weight down 3 lbs since 10/16/14  - PEG tube: none  3) Risk Factors: The patient has been educated about risk factors including alcohol and tobacco abuse; they understand that avoidance of alcohol and tobacco is important to prevent recurrences as well as other cancers  4) Swallowing: Pt has difficulty chewing, but sticks to smooth foods.  5) Dental: Encouraged to continue regular followup with dentistry, and dental hygiene including fluoride rinses.    6) Thyroid function: normal baseline, did not receive enough RT to likely cause hypothyroidism  Lab Results  Component Value Date   TSH 1.315 06/07/2014    7) Social: No active social issues to address at this time    8) Other: The pt  should schedule an appointment with Dr. Vicie Mutters. Have the nutritionist call the pt to educate him that food is an integral part of recovery since he is loosing weight.  9) Schedule CT of the neck and chest in July. Gave a card to the pt to schedule a follow-up in July after the CT scan. The patient was encouraged to call with any issues or questions before then.  This document serves as a record of services personally performed by Eppie Gibson, MD. It was created on her behalf by Darcus Austin, a trained medical scribe. The creation of this record is based on the scribe's personal observations and the provider's statements to them. This document has been checked and approved by  the attending provider.      _____________________________   Eppie Gibson, MD

## 2014-10-30 ENCOUNTER — Ambulatory Visit: Payer: Medicare Other | Attending: Radiation Oncology

## 2014-10-30 ENCOUNTER — Ambulatory Visit: Payer: Medicare Other

## 2014-10-30 ENCOUNTER — Telehealth: Payer: Self-pay | Admitting: *Deleted

## 2014-10-30 DIAGNOSIS — R1311 Dysphagia, oral phase: Secondary | ICD-10-CM | POA: Diagnosis not present

## 2014-10-30 DIAGNOSIS — I10 Essential (primary) hypertension: Secondary | ICD-10-CM | POA: Insufficient documentation

## 2014-10-30 DIAGNOSIS — K219 Gastro-esophageal reflux disease without esophagitis: Secondary | ICD-10-CM | POA: Diagnosis not present

## 2014-10-30 DIAGNOSIS — E119 Type 2 diabetes mellitus without complications: Secondary | ICD-10-CM | POA: Insufficient documentation

## 2014-10-30 DIAGNOSIS — C062 Malignant neoplasm of retromolar area: Secondary | ICD-10-CM | POA: Insufficient documentation

## 2014-10-30 DIAGNOSIS — R1310 Dysphagia, unspecified: Secondary | ICD-10-CM

## 2014-10-30 NOTE — Telephone Encounter (Signed)
CALLED PATIENT TO INFORM OF LABS @ [facility]  AND CT FOR 01-04-15 @ [facility] AND HIS FU WITH DR. Isidore Moos ON 01-05-15 @ 11:20 AM , SPOKE WITH PATIENT AND HE IS AWARE OF THESE APPTS.

## 2014-10-30 NOTE — Patient Instructions (Signed)
Continue to perform exercises 2-3 times per day. We found today when you eat/drink make sure to swallow twice with EFFORT, and alternate 1:1 ratio solid/liquid, and you will clear your throat less during meals.

## 2014-10-30 NOTE — Therapy (Signed)
[facility] [address] [place], [place], [address] Phone: [phone]   Fax:  [phone]  Speech Language Pathology Treatment  Patient Details  Name: Timothy Kim MRN: [id number] Date of Birth: July 14, 1929 Referring Provider:  Marletta Lor, MD  Encounter Date: 10/30/2014      End of Session - 10/30/14 1404    Visit Number 3   Number of Visits 5   Date for SLP Re-Evaluation 12/29/14   SLP Start Time 1322   SLP Stop Time  1400   SLP Time Calculation (min) 38 min   Activity Tolerance Patient tolerated treatment well      Past Medical History  Diagnosis Date  . ALLERGIC RHINITIS 02/09/2008    Qualifier: Diagnosis of  By: Burnice Logan  MD, [place] HYPERTROPHY 03/03/2007    Qualifier: Diagnosis of  By: Rogue Bussing CMA, Maryann Alar    . DIABETES MELLITUS, TYPE II 03/03/2007    Qualifier: Diagnosis of  By: Rogue Bussing CMA, Maryann Alar    . GERD 03/03/2007    Qualifier: Diagnosis of  By: Rogue Bussing CMA, Maryann Alar    . HYPERTENSION 03/03/2007    Qualifier: Diagnosis of  By: Rogue Bussing CMA, Maryann Alar    . LOW BACK PAIN 06/09/2007    Qualifier: Diagnosis of  By: Burnice Logan  MD, Doretha Sou   . 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 OSTEOARTHRITIS 06/09/2007    Qualifier: Diagnosis of  By: Burnice Logan  MD, Doretha Sou   . Glaucoma   . CLL (chronic lymphocytic leukemia)     Stage 1  . Tonsillar cancer     Right Tonsil, invasive squamous cell  . Cancer 05/2014    tonsil  . Radiation 09/04/14-10/13/14    right retromolar region and right neck 60 Gy    Past Surgical History  Procedure Laterality Date  . Hernia repair      inguinal  . Rotator cuff repair    . Laminectomy    . Retinal detachment surgery    . Cataract extraction    . Tonsil biopsy Right 05/24/14  . Maxillectomy  07/21/14, [place]    RSND, R rim mandibulectomy, dental extractions, r  parascauplar flap reconstruction    There were no vitals filed for this visit.  Visit Diagnosis: Dysphagia      Subjective Assessment - 10/30/14 1327    Subjective "I'm recovering - they said it would take some time."               ADULT SLP TREATMENT - 10/30/14 1327    General Information   Behavior/Cognition Alert;Cooperative;Pleasant mood   Treatment Provided   Treatment provided Dysphagia   Dysphagia Treatment   Temperature Spikes Noted No   Oral Cavity - Dentition Missing dentition   Treatment Methods Therapeutic exercise;Skilled observation   Patient observed directly with PO's Yes   Type of PO's observed Thin liquids;Dysphagia 1 (puree)   Oral Phase Signs & Symptoms --  none   Pharyngeal Phase Signs & Symptoms Immediate throat clear;Delayed throat clear   Type of cueing Verbal  for double swallows solid&liquid, forceful swallows   Amount of cueing Maximal  pt independent by end of session   Other treatment/comments Pt is drinking high protein drinks and eating pureed foods. Denies overt s/s aspiration or aspiration PNA when asked by SLP. Pt reports it has been more successful with oral residue clearing with SLP suggestion  last session re: liquid wash. Pt ate applesauce and drank H2O with consistent throat clears. When SLP had pt take slightly smaller bites and double swallow with solids, then immediately follow with swallow H2O with hard double swallow, no throat clearing observed. SLP told pt to complete that sequence from this point onward. With HEP today, pt req'd min cues rarely.   Pain Assessment   Pain Assessment No/denies pain   Dysphagia Recommendations   Diet recommendations Dysphagia 1 (puree);Thin liquid  diet as tolerated   Liquids provided via Straw   Compensations Small sips/bites;Multiple dry swallows after each bite/sip;Follow solids with liquid   Progression Toward Goals   Progression toward goals Progressing toward goals          SLP  Education - 10/30/14 1404    Education provided Yes   Education Details compensations for safe swallowing   Person(s) Educated Patient   Methods Explanation;Demonstration;Verbal cues   Comprehension Verbalized understanding;Returned demonstration          SLP Short Term Goals - 10/30/14 1406    SLP SHORT TERM GOAL #1   Title pt will perform HEP with rare min A   Time 2   Period --  visits   Status On-going  renewed goal on 10-30-14   SLP SHORT TERM GOAL #2   Title pt will tell SLP why he is completing HEP with modified independence   Time 2   Period --  visits   Status On-going          SLP Long Term Goals - 10/30/14 1407    SLP LONG TERM GOAL #1   Title pt will complete HEP with modified independence   Time 3   Period --  visits   Status On-going  goals renewed 10-30-14   SLP LONG TERM GOAL #2   Title pt wil tell SLP why a food journal can be helpful when transitioning back to full PO diet.   Time 3   Period --  visits   Status On-going   SLP LONG TERM GOAL #3   Title pt will tell SLP 3 s/s aspiration PNA with modified independence   Time 3   Period --  visits   Status On-going          Plan - 10/30/14 1405    Clinical Impression Statement Skilled ST needs to continue to assess pt's safety with POs, and assess pt success with HEP in order to maintain healthy pulmonary status as well as to augment healing process by PO diet, safely.   Speech Therapy Frequency --  approx every 4 weeks   Duration --  for 60 days   Treatment/Interventions Aspiration precaution training;Pharyngeal strengthening exercises;Diet toleration management by SLP;SLP instruction and feedback;Compensatory strategies   Potential to Achieve Goals Good        Problem List Patient Active Problem List   Diagnosis Date Noted  . Anemia in neoplastic disease 10/17/2014  . 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 Abnormal weight loss  05/31/2014  . Hemoptysis 05/31/2014  . Cancer of the lip, oral cavity, and pharynx 05/30/2014  . CLL (chronic lymphocytic leukemia) 07/07/2012  . ALLERGIC RHINITIS 02/09/2008  . 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 OSTEOARTHRITIS 06/09/2007  . LOW BACK PAIN 06/09/2007  . DIABETES MELLITUS, TYPE II 03/03/2007  . HYPERTENSION 03/03/2007  . GERD 03/03/2007  . BENIGN PROSTATIC HYPERTROPHY 03/03/2007    Garald Balding, SLP 10/30/2014, 2:09 PM  [facility]  [facility] [address] [place], [place], [address] Phone: [phone]   Fax:  [phone]

## 2014-10-31 ENCOUNTER — Telehealth: Payer: Self-pay | Admitting: *Deleted

## 2014-10-31 NOTE — Telephone Encounter (Signed)
10/30/14 1042  Per Dr. Isidore Moos, called Dr. Jesusita Oka office, spoke with Leroy Sea. Informed pt finished RT in April, healing well, seeing Dr. Isidore Moos in July with imaging, patient would like f/u with Dr. Vicie Mutters as he recommends. He verbalized understanding.  Gayleen Orem, RN, BSN, [facility] at [facility] [phone]

## 2014-10-31 NOTE — Telephone Encounter (Signed)
Received call from Amy with Dr. Waynard Edwards office.  She indicated Dr. Vicie Mutters will see patient in August s/p July imaging w/ Dr. Isidore Moos; she will notify patient with this information.  Gayleen Orem, RN, BSN, [facility] at [facility] [phone]

## 2014-11-02 ENCOUNTER — Ambulatory Visit (INDEPENDENT_AMBULATORY_CARE_PROVIDER_SITE_OTHER): Payer: Medicare Other | Admitting: Internal Medicine

## 2014-11-02 ENCOUNTER — Encounter: Payer: Self-pay | Admitting: Internal Medicine

## 2014-11-02 VITALS — BP 138/70 | HR 69 | Temp 98.9°F | Resp 20 | Ht 65.0 in | Wt 152.0 lb

## 2014-11-02 DIAGNOSIS — C062 Malignant neoplasm of retromolar area: Secondary | ICD-10-CM

## 2014-11-02 DIAGNOSIS — L589 Radiodermatitis, unspecified: Secondary | ICD-10-CM | POA: Diagnosis not present

## 2014-11-02 DIAGNOSIS — I10 Essential (primary) hypertension: Secondary | ICD-10-CM

## 2014-11-02 MED ORDER — METOPROLOL SUCCINATE ER 100 MG PO TB24: ORAL_TABLET | ORAL | Status: DC

## 2014-11-02 NOTE — Progress Notes (Signed)
Pre visit review using our clinic review tool, if applicable. No additional management support is needed unless otherwise documented below in the visit note. 

## 2014-11-02 NOTE — Progress Notes (Signed)
Subjective:    Patient ID: Timothy Kim, male    DOB: 1930-05-26, 81 y.o.   MRN: [id number]  HPI 81 year old patient who is seen today in follow-up.  He has recently completed the RT for a malignant neoplasm involving the oral pharynx.  He has a history of essential hypertension and chronic low back pain. He has some radiation dermatitis involving the right neck area that has nicely resolved. His low back pain has been stable.  Lab Results  Component Value Date   HGBA1C 5.5 02/01/2014   He has a history of diet-controlled diabetes  Past Medical History  Diagnosis Date  . ALLERGIC RHINITIS 02/09/2008    Qualifier: Diagnosis of  By: Burnice Logan  MD, [place] HYPERTROPHY 03/03/2007    Qualifier: Diagnosis of  By: Rogue Bussing CMA, Maryann Alar    . DIABETES MELLITUS, TYPE II 03/03/2007    Qualifier: Diagnosis of  By: Rogue Bussing CMA, Maryann Alar    . GERD 03/03/2007    Qualifier: Diagnosis of  By: Rogue Bussing CMA, Maryann Alar    . HYPERTENSION 03/03/2007    Qualifier: Diagnosis of  By: Rogue Bussing CMA, Maryann Alar    . LOW BACK PAIN 06/09/2007    Qualifier: Diagnosis of  By: Burnice Logan  MD, Doretha Sou   . 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 OSTEOARTHRITIS 06/09/2007    Qualifier: Diagnosis of  By: Burnice Logan  MD, Doretha Sou   . Glaucoma   . CLL (chronic lymphocytic leukemia)     Stage 1  . Tonsillar cancer     Right Tonsil, invasive squamous cell  . Cancer 05/2014    tonsil  . Radiation 09/04/14-10/13/14    right retromolar region and right neck 60 Gy    History   Social History  . Marital Status: Single    Spouse Name: N/A  . Number of Children: 2  . Years of Education: N/A   Occupational History  . Not on file.   Social History Main Topics  . Smoking status: Never Smoker   . Smokeless tobacco: Never Used  . Alcohol Use: 1.8 - 2.4 oz/week    3-4 Shots of liquor per week     Comment: Gin and vodka daily  . Drug  Use: No  . Sexual Activity: Not on file   Other Topics Concern  . Not on file   Social History Narrative   Patient is divorced with 2 children.   Patient has never smoked. Patient has never used smokeless tobacco.    Patient drinks gin or vodka on a daily basis.    Past Surgical History  Procedure Laterality Date  . Hernia repair      inguinal  . Rotator cuff repair    . Laminectomy    . Retinal detachment surgery    . Cataract extraction    . Tonsil biopsy Right 05/24/14  . Maxillectomy  07/21/14, [place]    RSND, R rim mandibulectomy, dental extractions, r parascauplar flap reconstruction    Family History  Problem Relation Age of Onset  . Cancer Mother     lung ca  . Alcohol abuse Sister   . Hypertension Neg Hx     family hx  . Sudden death Neg Hx     famiylhx    No Known Allergies  Current Outpatient Prescriptions on File Prior to Visit  Medication Sig Dispense Refill  . aspirin 325 MG tablet Take  325 mg by mouth.    . brimonidine-timolol (COMBIGAN) 0.2-0.5 % ophthalmic solution Apply 1 drop to eye.    Marland Kitchen DHEA 50 MG TABS Take 1 tablet by mouth daily.    Javier Docker Oil 300 MG CAPS Take 300 mg by mouth daily.    Marland Kitchen latanoprost (XALATAN) 0.005 % ophthalmic solution   4  . Lutein-Zeaxanthin 15-0.7 MG CAPS Take 1 tablet by mouth daily.    . sodium chloride (OCEAN) 0.65 % nasal spray Place 2 sprays into the nose.    . sodium fluoride (PREVIDENT 5000 PLUS) 1.1 % CREA dental cream Apply thin ribbon of cream to tooth brush. Brush teeth for 2 minutes. Spit out excess. Repeat nightly. 1 Tube prn  . tadalafil (CIALIS) 20 MG tablet Take 1 tablet (20 mg total) by mouth daily as needed for erectile dysfunction. 10 tablet 3  . triamcinolone cream (KENALOG) 0.1 % APPLY TO AFFECTED AREA TWICE DAILY 80 g 1  . zolpidem (AMBIEN) 5 MG tablet TAKE 1 TABLET AT BEDTIME AS NEEDED FOR SLEEP 30 tablet 2  . sildenafil (VIAGRA) 100 MG tablet Take 1 tablet (100 mg total) by mouth as needed for  erectile dysfunction. (Patient not taking: Reported on 06/07/2014) 3 tablet 6   No current facility-administered medications on file prior to visit.    BP 138/70 mmHg  Pulse 69  Temp(Src) 98.9 F (37.2 C) (Oral)  Resp 20  Ht 5\' 5"  (1.651 m)  Wt 152 lb (68.947 kg)  BMI 25.29 kg/m2  SpO2 96%      Review of Systems  Constitutional: Negative for fever, chills, appetite change and fatigue.  HENT: Negative for congestion, dental problem, ear pain, hearing loss, sore throat, tinnitus, trouble swallowing and voice change.   Eyes: Negative for pain, discharge and visual disturbance.  Respiratory: Negative for cough, chest tightness, wheezing and stridor.   Cardiovascular: Negative for chest pain, palpitations and leg swelling.  Gastrointestinal: Negative for nausea, vomiting, abdominal pain, diarrhea, constipation, blood in stool and abdominal distention.  Genitourinary: Negative for urgency, hematuria, flank pain, discharge, difficulty urinating and genital sores.  Musculoskeletal: Negative for myalgias, back pain, joint swelling, arthralgias, gait problem and neck stiffness.  Skin: Negative for rash.  Neurological: Negative for dizziness, syncope, speech difficulty, weakness, numbness and headaches.  Hematological: Negative for adenopathy. Does not bruise/bleed easily.  Psychiatric/Behavioral: Negative for behavioral problems and dysphoric mood. The patient is not nervous/anxious.        Objective:   Physical Exam  Constitutional: He is oriented to person, place, and time. He appears well-developed.  HENT:  Head: Normocephalic.  Right Ear: External ear normal.  Left Ear: External ear normal.  Right facial swelling  Eyes: Conjunctivae and EOM are normal.  Neck: Normal range of motion.  Cardiovascular: Normal rate and normal heart sounds.   Pulmonary/Chest: Breath sounds normal. No respiratory distress. He has no wheezes. He has no rales.  Abdominal: Bowel sounds are normal.    Musculoskeletal: Normal range of motion. He exhibits edema. He exhibits no tenderness.  Neurological: He is alert and oriented to person, place, and time.  Psychiatric: He has a normal mood and affect. His behavior is normal.          Assessment & Plan:   Essential hypertension, stable.  Blood pressure 120/70 History of right tonsillar squamous cell carcinoma.  Follow-up ENT and radiation oncology Diet-controlled diabetes Chronic low back pain  Recheck 6 months or as needed

## 2014-11-02 NOTE — Patient Instructions (Signed)
Limit your sodium (Salt) intake  Return in 6 months for follow-up  ENT follow-up as scheduled  Follow-up radiation oncology as scheduled  Please check your blood pressure on a regular basis.  If it is consistently greater than 150/90, please make an office appointment.

## 2014-11-07 DIAGNOSIS — H35351 Cystoid macular degeneration, right eye: Secondary | ICD-10-CM | POA: Diagnosis not present

## 2014-11-07 DIAGNOSIS — H35362 Drusen (degenerative) of macula, left eye: Secondary | ICD-10-CM | POA: Diagnosis not present

## 2014-11-08 ENCOUNTER — Telehealth: Payer: Self-pay | Admitting: Nutrition

## 2014-11-08 NOTE — Telephone Encounter (Signed)
Contacted patient by phone for nutrition follow up. Patient reports he understands he needs to increase calories and protein to stop losing weight. Reports problems chewing continue.  Patient continues to follow a dysphagia 1 pured diet. He is drinking whole milk, 2 Ensure Plus and one Carnation breakfast daily. He reports he is moving as much as he can. Weight documented as 152 pounds on May 12. Patient states he thinks he has beginning to gain weight.  Nutrition diagnosis: Food and nutrition related knowledge deficit improved.  Intervention: I enforced importance of continuing adequate calories and protein to promote weight maintenance/gain. Recommended patient increase oral nutrition supplements to 4 daily, especially if oral intake of food is decreased Patient verbalizes understanding and appreciation for follow-up.  Monitoring, evaluation, goals: Patient will work to continue to increase calories and protein to minimize weight loss.  Next visit: Patient will contact me for questions or concerns.  **Disclaimer: This note was dictated with voice recognition software. Similar sounding words can inadvertently be transcribed and this note may contain transcription errors which may not have been corrected upon publication of note.**

## 2014-11-21 ENCOUNTER — Ambulatory Visit (HOSPITAL_COMMUNITY): Payer: Self-pay | Admitting: Dentistry

## 2014-11-22 ENCOUNTER — Ambulatory Visit (HOSPITAL_COMMUNITY): Payer: Self-pay | Admitting: Dentistry

## 2014-11-22 ENCOUNTER — Encounter (HOSPITAL_COMMUNITY): Payer: Self-pay | Admitting: Dentistry

## 2014-11-22 VITALS — BP 150/80 | HR 68 | Temp 98.2°F | Wt 149.0 lb

## 2014-11-22 DIAGNOSIS — R682 Dry mouth, unspecified: Secondary | ICD-10-CM

## 2014-11-22 DIAGNOSIS — K08409 Partial loss of teeth, unspecified cause, unspecified class: Secondary | ICD-10-CM

## 2014-11-22 DIAGNOSIS — C062 Malignant neoplasm of retromolar area: Secondary | ICD-10-CM

## 2014-11-22 DIAGNOSIS — M264 Malocclusion, unspecified: Secondary | ICD-10-CM

## 2014-11-22 DIAGNOSIS — Z5189 Encounter for other specified aftercare: Secondary | ICD-10-CM | POA: Diagnosis not present

## 2014-11-22 DIAGNOSIS — K117 Disturbances of salivary secretion: Secondary | ICD-10-CM

## 2014-11-22 DIAGNOSIS — Z923 Personal history of irradiation: Secondary | ICD-10-CM

## 2014-11-22 NOTE — Progress Notes (Signed)
11/22/2014  Patient:            Timothy Kim Date of Birth:  10-30-1929 MRN:                [id number]   BP 150/80 mmHg  Pulse 68  Temp(Src) 98.2 F (36.8 C) (Oral)  Wt 149 lb (67.586 kg)  Timothy Kim is an 81 year old male with history of squamous cell carcinoma of the right retromolar trigone. Patient underwent surgical resection with Dr. Ardath Sax at [facility] on 07/21/14. Patient underwent right maxillectomy, right marginal mandibulectomy, radical neck dissection, and reconstructive flap surgery at that time. Patient received postoperative radiation therapy with Dr. Isidore Moos from 09/04/2014 through 10/13/2014. Patient now presents for oral examination after radiation therapy. The patient's primary dentist is Dr. Mohammed Kindle.  REVIEW OF CHIEF COMPLAINTS:  DRY MOUTH: Yes. HARD TO SWALLOW: No HURT TO SWALLOW: No  TASTE CHANGES: No problems with taste. SORES IN MOUTH:  NO TRISMUS: No discomfort. Maximum interincisal opening is 35 mm.  WEIGHT: 149 lbs.  OH REGIMEN:  BRUSHING: Twice a day FLOSSING: No flossing RINSING: Salt water and baking soda rinses. FLUORIDE: Prevident 5000 plus at bedtime. TRISMUS EXERCISES:  Maximum interincisal opening: 35 mm  DENTAL EXAM:  Oral Hygiene:(PLAQUE): Very good oral hygiene. LOCATION OF MUCOSITIS: None noted DESCRIPTION OF SALIVA: Decreased. Incipient xerostomia. ANY EXPOSED BONE: None noted OTHER WATCHED AREAS: Previous surgical site and flap reconstruction areas. DX: Xerostomia and loss of teeth and malocclusion.  RECOMMENDATIONS: 1. Brush after meals and at bedtime.  Use fluoride at bedtime. 2. Use trismus exercises as directed. 3. Use Biotene Rinse or salt water/baking soda rinses. 4. Multiple sips of water as needed. 5. Return to Dr. Mohammed Kindle for follow up. Consider referral to prosthodontist, Dr. Jamie Kato, for evaluation for implant therapy after discussion with Dr. Isidore Moos concerning previous radiation  therapy and ports.   Lenn Cal, DDS

## 2014-11-22 NOTE — Patient Instructions (Addendum)
RECOMMENDATIONS: 1. Brush after meals and at bedtime.  Use fluoride at bedtime. 2. Use trismus exercises as directed. 3. Use Biotene Rinse or salt water/baking soda rinses. 4. Multiple sips of water as needed. 5. Return to Dr. Mohammed Kindle for follow up. Consider referral to prosthodontist, Dr. Jamie Kato, for evaluation for implant therapy after discussion with Dr. Isidore Moos concerning previous radiation therapy and ports.   Lenn Cal, DDS

## 2014-11-23 ENCOUNTER — Other Ambulatory Visit: Payer: Self-pay | Admitting: Internal Medicine

## 2014-11-27 ENCOUNTER — Telehealth: Payer: Self-pay | Admitting: Internal Medicine

## 2014-11-27 NOTE — Telephone Encounter (Signed)
Spoke to pt, told him Rx was called into the pharmacy on 6/3. Pt verbalized understanding and said yes, he called the pharmacy.

## 2014-11-27 NOTE — Telephone Encounter (Signed)
° ° ° °

## 2014-12-04 ENCOUNTER — Ambulatory Visit: Payer: Medicare Other | Attending: Radiation Oncology

## 2014-12-04 ENCOUNTER — Ambulatory Visit: Payer: Medicare Other

## 2014-12-04 DIAGNOSIS — R1310 Dysphagia, unspecified: Secondary | ICD-10-CM | POA: Insufficient documentation

## 2014-12-04 NOTE — Therapy (Signed)
[facility] [address] [place], [place], [address] Phone: [phone]   Fax:  [phone]  Speech Language Pathology Treatment  Patient Details  Name: Timothy Kim MRN: [id number] Date of Birth: 1929-07-04 Referring Provider:  Marletta Lor, MD  Encounter Date: 12/04/2014      End of Session - 12/04/14 1405    Visit Number 4   Number of Visits 5   Date for SLP Re-Evaluation 12/29/14   SLP Start Time 1320   SLP Stop Time  1401   SLP Time Calculation (min) 41 min   Activity Tolerance Patient tolerated treatment well      Past Medical History  Diagnosis Date  . ALLERGIC RHINITIS 02/09/2008    Qualifier: Diagnosis of  By: Burnice Logan  MD, [place] HYPERTROPHY 03/03/2007    Qualifier: Diagnosis of  By: Rogue Bussing CMA, Maryann Alar    . DIABETES MELLITUS, TYPE II 03/03/2007    Qualifier: Diagnosis of  By: Rogue Bussing CMA, Maryann Alar    . GERD 03/03/2007    Qualifier: Diagnosis of  By: Rogue Bussing CMA, Maryann Alar    . HYPERTENSION 03/03/2007    Qualifier: Diagnosis of  By: Rogue Bussing CMA, Maryann Alar    . LOW BACK PAIN 06/09/2007    Qualifier: Diagnosis of  By: Burnice Logan  MD, Doretha Sou   . 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 OSTEOARTHRITIS 06/09/2007    Qualifier: Diagnosis of  By: Burnice Logan  MD, Doretha Sou   . Glaucoma   . CLL (chronic lymphocytic leukemia)     Stage 1  . Tonsillar cancer     Right Tonsil, invasive squamous cell  . Cancer 05/2014    tonsil  . Radiation 09/04/14-10/13/14    right retromolar region and right neck 60 Gy    Past Surgical History  Procedure Laterality Date  . Hernia repair      inguinal  . Rotator cuff repair    . Laminectomy    . Retinal detachment surgery    . Cataract extraction    . Tonsil biopsy Right 05/24/14  . Maxillectomy  07/21/14, [place]    RSND, R rim mandibulectomy, dental extractions, r  parascauplar flap reconstruction    There were no vitals filed for this visit.  Visit Diagnosis: Dysphagia      Subjective Assessment - 12/04/14 1335    Subjective Pt reports eating oyster soup this weekend and being able to clear the solid oyster pieces. Biggest difficulty with eating is chewing.               ADULT SLP TREATMENT - 12/04/14 1327    General Information   Behavior/Cognition Alert;Cooperative;Pleasant mood   Treatment Provided   Treatment provided Dysphagia   Dysphagia Treatment   Temperature Spikes Noted No   Oral Cavity - Dentition Missing dentition   Treatment Methods Therapeutic exercise   Patient observed directly with PO's Yes   Type of PO's observed Thin liquids;Dysphagia 1 (puree)   Oral Phase Signs & Symptoms --  none noted   Pharyngeal Phase Signs & Symptoms Immediate throat clear;Delayed throat clear   Type of cueing Verbal  double swallows solid&liquid, and forceful swallows   Amount of cueing Minimal   Other treatment/comments Nasal regurgitation noted. Pt to call ENT to inquire re: this, and sinus drainage.  Pt reports not completing HEP since last session; SLP reminded pt why he is completing  HEP. Pt req'd rare verbal and demo cues for HEP completion today. Pt completed safe swallow strategies with min cueing initially then was independent SLP reitereated the rationale for strategies. Pt told SLP why he is completing HEP 15 minutes after initial explanation by SLP.    Pain Assessment   Pain Assessment No/denies pain   Dysphagia Recommendations   Diet recommendations Dysphagia 1 (puree);Thin liquid   Liquids provided via Cup   Compensations Small sips/bites;Multiple dry swallows after each bite/sip;Follow solids with liquid            SLP Short Term Goals - 12/04/14 1356    SLP SHORT TERM GOAL #1   Title pt will perform HEP with rare min A   Status Achieved  renewed goal on 10-30-14   SLP SHORT TERM GOAL #2   Title pt will tell SLP  why he is completing HEP with modified independence   Status Achieved          SLP Long Term Goals - 12/04/14 1358    SLP LONG TERM GOAL #1   Title pt will complete HEP with modified independence   Time 2   Period --  visits   Status On-going  goals renewed 10-30-14   SLP LONG TERM GOAL #2   Title pt wil tell SLP why a food journal can be helpful when transitioning back to full PO diet.   Time 2   Period --  visits   Status On-going   SLP LONG TERM GOAL #3   Title pt will tell SLP 3 s/s aspiration PNA with modified independence   Time 2   Period --  visits   Status On-going          Plan - 12/04/14 1406    Clinical Impression Statement Pt still not completing HEP. SLP again reiterated need to complete as directed and rationale why. Pt also was not adhering to swallow safety strategies. Skilled ST needed to cont to assess safety with POs as well as competency wiht HEP   Speech Therapy Frequency --  approx every 4 weeks   Duration --  until 12-29-14   Treatment/Interventions Aspiration precaution training;Pharyngeal strengthening exercises;Diet toleration management by SLP;SLP instruction and feedback;Compensatory strategies   Potential to Achieve Goals Good        Problem List Patient Active Problem List   Diagnosis Date Noted  . Anemia in neoplastic disease 10/17/2014  . 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 Abnormal weight loss 05/31/2014  . Hemoptysis 05/31/2014  . Cancer of the lip, oral cavity, and pharynx 05/30/2014  . CLL (chronic lymphocytic leukemia) 07/07/2012  . ALLERGIC RHINITIS 02/09/2008  . 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 OSTEOARTHRITIS 06/09/2007  . LOW BACK PAIN 06/09/2007  . DIABETES MELLITUS, TYPE II 03/03/2007  . Essential hypertension 03/03/2007  . GERD 03/03/2007  . BENIGN PROSTATIC HYPERTROPHY 03/03/2007    [facility] , MS, CCC-SLP  12/04/2014, 2:08 PM  [facility] [address] [place], [place], [address] Phone: [phone]   Fax:  [phone]

## 2014-12-29 DIAGNOSIS — H04123 Dry eye syndrome of bilateral lacrimal glands: Secondary | ICD-10-CM | POA: Diagnosis not present

## 2014-12-29 DIAGNOSIS — H4011X3 Primary open-angle glaucoma, severe stage: Secondary | ICD-10-CM | POA: Diagnosis not present

## 2014-12-29 DIAGNOSIS — H4011X1 Primary open-angle glaucoma, mild stage: Secondary | ICD-10-CM | POA: Diagnosis not present

## 2014-12-29 DIAGNOSIS — H3532 Exudative age-related macular degeneration: Secondary | ICD-10-CM | POA: Diagnosis not present

## 2014-12-29 DIAGNOSIS — Z961 Presence of intraocular lens: Secondary | ICD-10-CM | POA: Diagnosis not present

## 2014-12-29 DIAGNOSIS — H3531 Nonexudative age-related macular degeneration: Secondary | ICD-10-CM | POA: Diagnosis not present

## 2015-01-04 ENCOUNTER — Ambulatory Visit (HOSPITAL_BASED_OUTPATIENT_CLINIC_OR_DEPARTMENT_OTHER)
Admission: RE | Admit: 2015-01-04 | Discharge: 2015-01-04 | Disposition: A | Discharge status: Home / Self Care | Payer: Medicare Other | Source: Ambulatory Visit | Attending: Radiation Oncology | Admitting: Radiation Oncology

## 2015-01-04 ENCOUNTER — Encounter (HOSPITAL_COMMUNITY): Payer: Self-pay

## 2015-01-04 ENCOUNTER — Ambulatory Visit (HOSPITAL_COMMUNITY)
Admission: RE | Admit: 2015-01-04 | Discharge: 2015-01-04 | Disposition: A | Discharge status: Home / Self Care | Payer: Medicare Other | Source: Ambulatory Visit | Attending: Radiation Oncology | Admitting: Radiation Oncology

## 2015-01-04 DIAGNOSIS — M8448XA Pathological fracture, other site, initial encounter for fracture: Secondary | ICD-10-CM | POA: Insufficient documentation

## 2015-01-04 DIAGNOSIS — R221 Localized swelling, mass and lump, neck: Secondary | ICD-10-CM | POA: Insufficient documentation

## 2015-01-04 DIAGNOSIS — C062 Malignant neoplasm of retromolar area: Secondary | ICD-10-CM

## 2015-01-04 DIAGNOSIS — M8458XA Pathological fracture in neoplastic disease, other specified site, initial encounter for fracture: Secondary | ICD-10-CM | POA: Diagnosis not present

## 2015-01-04 DIAGNOSIS — R911 Solitary pulmonary nodule: Secondary | ICD-10-CM | POA: Insufficient documentation

## 2015-01-04 LAB — BASIC METABOLIC PANEL (CC13)
Anion Gap: 7 mEq/L (ref 3–11)
BUN: 18.9 mg/dL (ref 7.0–26.0)
CHLORIDE: 104 meq/L (ref 98–109)
CO2: 32 mEq/L — ABNORMAL HIGH (ref 22–29)
Calcium: 9.3 mg/dL (ref 8.4–10.4)
Creatinine: 1.2 mg/dL (ref 0.7–1.3)
EGFR: 57 mL/min/{1.73_m2} — AB (ref >90)
GLUCOSE: 121 mg/dL (ref 70–140)
Potassium: 4.4 mEq/L (ref 3.5–5.1)
Sodium: 143 mEq/L (ref 136–145)

## 2015-01-04 MED ORDER — IOHEXOL 300 MG/ML  SOLN
100.0000 mL | Freq: Once | INTRAMUSCULAR | Status: AC | PRN
  Administered 2015-01-04: 100 mL via INTRAVENOUS

## 2015-01-04 NOTE — Progress Notes (Signed)
Pain Status: none  Nutritional Status: 1) Using a feeding tube?: no 2) Summary of daily intake: eating soups and softer foods 3) Weight changes, if any: none  Swallowing Status: 01/08/15 Speech Clinic  therapy appt with Garald Balding. Reports he is swallowing ok.  Smoking or chewing tobacco?  NO  Dental (if applicable):  Dr. Enrique Sack appt seen 11/22/14 : Next dental appointment is on   Dr. Mohammed Kindle Primary dentist to follow up ,.  Using fluoride trays daily? Yes and also prevident cream.   Summary of last ENT visit is . Next ENT visit is on 02/14/15 with Dr. Mateo Flow [facility]  Summary of last medical oncology visit (if applicable) is  9/48/54 . Continued supportive care, deferred  For future follow up appt with ENT and Radiation oncology   Next med/onc visit is on 10/16/2015 Dr. Alvy Bimler  Imaging done in the last month (if applicable) revealed : 12/17/01 CT Neck:Pathologic fracture of right mandible ramus and angle.could be secondary to  radiation osteonecrosis or less likely osseous mets disease. Assoc anterior dislocation of the right TMJ,s/p partial right maxillectomy with large myocutaneous muscle flap.s/p XRT,  Chest results:no metastatic disease,pulmonary nodule in right base stable since 2004,compatible benign process  Other notable issues, if any: Last TSH 06/07/14: 1.315.  Reports a poor appetite.  He reports an occasional dry mouth.  Reports he had to eat soups and softer foods.  Reports his energy level is low.  BP 153/75 mmHg  Pulse 70  Temp(Src) 97.7 F (36.5 C) (Oral)  Resp 20  Ht 5\' 5"  (1.651 m)  Wt 151 lb 14.4 oz (68.901 kg)  BMI 25.28 kg/m2  SpO2 98%   Wt Readings from Last 3 Encounters:  01/05/15 151 lb 14.4 oz (68.901 kg)  11/22/14 149 lb (67.586 kg)  11/02/14 152 lb (68.947 kg)

## 2015-01-05 ENCOUNTER — Ambulatory Visit (HOSPITAL_BASED_OUTPATIENT_CLINIC_OR_DEPARTMENT_OTHER)
Admission: RE | Admit: 2015-01-05 | Discharge: 2015-01-05 | Disposition: A | Discharge status: Home / Self Care | Payer: Medicare Other | Source: Ambulatory Visit | Attending: Radiation Oncology | Admitting: Radiation Oncology

## 2015-01-05 ENCOUNTER — Other Ambulatory Visit: Payer: Self-pay | Admitting: Radiation Oncology

## 2015-01-05 ENCOUNTER — Encounter: Payer: Self-pay | Admitting: Radiation Oncology

## 2015-01-05 ENCOUNTER — Ambulatory Visit
Admission: RE | Admit: 2015-01-05 | Discharge: 2015-01-05 | Disposition: A | Discharge status: Home / Self Care | Payer: Medicare Other | Source: Ambulatory Visit | Attending: Radiation Oncology | Admitting: Radiation Oncology

## 2015-01-05 ENCOUNTER — Other Ambulatory Visit: Payer: Self-pay | Admitting: *Deleted

## 2015-01-05 ENCOUNTER — Telehealth: Payer: Self-pay | Admitting: Oncology

## 2015-01-05 VITALS — BP 153/75 | HR 70 | Temp 97.7°F | Resp 20 | Ht 65.0 in | Wt 151.9 lb

## 2015-01-05 DIAGNOSIS — C062 Malignant neoplasm of retromolar area: Secondary | ICD-10-CM

## 2015-01-05 DIAGNOSIS — R634 Abnormal weight loss: Secondary | ICD-10-CM

## 2015-01-05 DIAGNOSIS — R310 Gross hematuria: Secondary | ICD-10-CM

## 2015-01-05 LAB — CBC WITH DIFFERENTIAL/PLATELET
BASO%: 0.3 % (ref 0.0–2.0)
Basophils Absolute: 0 10*3/uL (ref 0.0–0.1)
EOS%: 7.1 % — AB (ref 0.0–7.0)
Eosinophils Absolute: 0.5 10*3/uL (ref 0.0–0.5)
HEMATOCRIT: 37.7 % — AB (ref 38.4–49.9)
HGB: 12.3 g/dL — ABNORMAL LOW (ref 13.0–17.1)
LYMPH%: 44.1 % (ref 14.0–49.0)
MCH: 32.9 pg (ref 27.2–33.4)
MCHC: 32.6 g/dL (ref 32.0–36.0)
MCV: 100.8 fL — AB (ref 79.3–98.0)
MONO#: 0.6 10*3/uL (ref 0.1–0.9)
MONO%: 9.1 % (ref 0.0–14.0)
NEUT#: 2.8 10*3/uL (ref 1.5–6.5)
NEUT%: 39.4 % (ref 39.0–75.0)
Platelets: 167 10*3/uL (ref 140–400)
RBC: 3.74 10*6/uL — ABNORMAL LOW (ref 4.20–5.82)
RDW: 16.5 % — ABNORMAL HIGH (ref 11.0–14.6)
WBC: 7.1 10*3/uL (ref 4.0–10.3)
lymph#: 3.1 10*3/uL (ref 0.9–3.3)

## 2015-01-05 LAB — TSH CHCC: TSH: 1.795 m[IU]/L (ref 0.320–4.118)

## 2015-01-05 LAB — COMPREHENSIVE METABOLIC PANEL (CC13)
ALBUMIN: 3 g/dL — AB (ref 3.5–5.0)
ALT: 11 U/L (ref 0–55)
ANION GAP: 5 meq/L (ref 3–11)
AST: 24 U/L (ref 5–34)
Alkaline Phosphatase: 84 U/L (ref 40–150)
BUN: 18.7 mg/dL (ref 7.0–26.0)
CO2: 32 meq/L — AB (ref 22–29)
Calcium: 9.1 mg/dL (ref 8.4–10.4)
Chloride: 103 mEq/L (ref 98–109)
Creatinine: 1 mg/dL (ref 0.7–1.3)
EGFR: 74 mL/min/{1.73_m2} — ABNORMAL LOW (ref >90)
GLUCOSE: 98 mg/dL (ref 70–140)
POTASSIUM: 4.3 meq/L (ref 3.5–5.1)
Sodium: 140 mEq/L (ref 136–145)
Total Bilirubin: 0.66 mg/dL (ref 0.20–1.20)
Total Protein: 6.1 g/dL — ABNORMAL LOW (ref 6.4–8.3)

## 2015-01-05 MED ORDER — FLUCONAZOLE 100 MG PO TABS: ORAL_TABLET | ORAL | Status: DC

## 2015-01-05 NOTE — Telephone Encounter (Signed)
Called Timothy Kim and advised him that his lab results do not explain why he is tired per Dr. Isidore Moos.  Also let him know that we are working on getting him an appointment with urology.  Churchill verbalized agreement and understanding.

## 2015-01-05 NOTE — Progress Notes (Signed)
Radiation Oncology         [phone] ________________________________  Name: Timothy Kim MRN: [id number]  Date: 01/05/2015  DOB: [birth year]  Follow-Up Visit Note  CC: [name], MD  Philomena Doheny, MD  Diagnosis and Prior Radiotherapy:       ICD-9-CM ICD-10-CM   1. Squamous cell carcinoma of retromolar trigone 145.6 C06.2 Comprehensive metabolic panel     CBC with Differential     TSH  2. Abnormal weight loss 783.21 R63.4 TSH    STAGE IVB pT4b pN0 Grade 2 Squamous cell carcinoma, Right Retromolar Trigone; +PNI, no LVSI  Indication for treatment: post-op, curative     Radiation treatment dates:   09/04/2014-10/13/2014  Site/dose:  Right retromolar region (tumor bed) and right neck /  60 Gy in 30 fractions to tumor bed and 54 Gy in 30 fractions to neck  Narrative:  The patient returns today for routine follow-up. He is notably discouraged appearing and does not feel he is well.   Pt notes he occassionally has new hematuria and sometimes passes copious amounts of blood. Denies flank pain during urination while passing the blood. He describes "pellets the size and color of brown rice" coming out while he passes blood in his urine. He states he had hematuria this morning this well. States his ankles and feet are swollen.  He reports an occasional dry mouth. The pt was taken some of his blood pressure medications and has not discussed resuming these with his PCP. Notably, he is off HCTZ. The pt states he doesn't sleep well due to sinus drainage. He has to sleep sitting up.  Pain Status: none  Nutritional Status 1) Using a feeding tube?: no  2) Summary of daily intake: The pt reports a poor appetite. Eating soups and softer foods 3) Weight changes, if any: none  Swallowing Status: 01/08/15 Speech Clinic therapy appt with Garald Balding. Reports he is swallowing ok.  Smoking or chewing tobacco? NO  Dental (if applicable): Dr. Enrique Sack appt seen 11/22/14 : Next dental  appointment is on Dr. Mohammed Kindle Primary dentist to follow up ,. Using fluoride trays daily? Yes and also prevident cream.  Summary of last ENT visit is . Next ENT visit is on 02/14/15 with Dr. Mateo Flow [facility]     Imaging done in the last month (if applicable) revealed : 3/87/56 CT Neck: Pathologic fracture of right mandible ramus and angle.could be secondary to radiation osteonecrosis or less likely osseous mets disease. Assoc anterior dislocation of the right TMJ,s/p partial right maxillectomy with large myocutaneous muscle flap.s/p XRT,  Chest results:no metastatic disease,pulmonary nodule in right base stable since 2004,compatible benign process    Other notable issues, if any: Last TSH 06/07/14: 79. Reports his energy level is still very low. He feels quite weak.  ALLERGIES:  has No Known Allergies.  Meds: Current Outpatient Prescriptions  Medication Sig Dispense Refill  . aspirin 325 MG tablet Take 325 mg by mouth.    . brimonidine-timolol (COMBIGAN) 0.2-0.5 % ophthalmic solution Apply 1 drop to eye.    Marland Kitchen DHEA 50 MG TABS Take 1 tablet by mouth daily.    Javier Docker Oil 300 MG CAPS Take 300 mg by mouth daily.    Marland Kitchen latanoprost (XALATAN) 0.005 % ophthalmic solution   4  . Lutein-Zeaxanthin 15-0.7 MG CAPS Take 1 tablet by mouth daily.    . metoprolol succinate (TOPROL-XL) 100 MG 24 hr tablet TAKE HALF TABLET IMMEDIATELY FOLLOWING A MEAL.  Currently taking  50mg  tab per his PC 90 tablet 3  . sodium fluoride (PREVIDENT 5000 PLUS) 1.1 % CREA dental cream Apply thin ribbon of cream to tooth brush. Brush teeth for 2 minutes. Spit out excess. Repeat nightly. 1 Tube prn  . tadalafil (CIALIS) 20 MG tablet Take 1 tablet (20 mg total) by mouth daily as needed for erectile dysfunction. 10 tablet 3  . triamcinolone cream (KENALOG) 0.1 % APPLY TO AFFECTED AREA TWICE DAILY 80 g 1  . zolpidem (AMBIEN) 5 MG tablet TAKE 1 TABLET AT BEDTIME AS NEEDED FOR SLEEP 30 tablet 2  . fluconazole (DIFLUCAN)  100 MG tablet Take 2 tablets today, then 1 tablet daily x 13 more days. 15 tablet 0  . sildenafil (VIAGRA) 100 MG tablet Take 1 tablet (100 mg total) by mouth as needed for erectile dysfunction. (Patient not taking: Reported on 06/07/2014) 79 tablet 6  . sodium chloride (OCEAN) 0.65 % nasal spray Place 2 sprays into the nose.     No current facility-administered medications for this encounter.    Physical Findings: The patient is in no acute distress. Patient is alert and oriented. Wt Readings from Last 3 Encounters:  01/05/15 151 lb 14.4 oz (68.901 kg)  11/22/14 149 lb (67.586 kg)  11/02/14 152 lb (68.947 kg)    height is 5\' 5"  (1.651 m) and weight is 151 lb 14.4 oz (68.901 kg). His oral temperature is 97.7 F (36.5 C). His blood pressure is 153/75 and his pulse is 70. His respiration is 20 and oxygen saturation is 98%. .  Some thrush is noted in the oral cavity. Heathy appearing tissue graft in the right oral cavity with thrush growing over the graft. No signs of cancer noted.  No palpable cervical, supraclavicular, or axillary adenopathy. Post operative swelling noted. Swelling/lymphedema in the right neck. Lungs CTAB. Heart RRR. Pt has  pitting edema bilaterally in his lower legs.  Lab Findings: Lab Results  Component Value Date   WBC 7.1 01/05/2015   HGB 12.3* 01/05/2015   HCT 37.7* 01/05/2015   MCV 100.8* 01/05/2015   PLT 167 01/05/2015   CMP     Component Value Date/Time   NA 140 01/05/2015 1255   NA 143 01/07/2012 0936   K 4.3 01/05/2015 1255   K 4.1 01/07/2012 0936   CL 103 01/07/2012 0936   CO2 32* 01/05/2015 1255   CO2 29 01/07/2012 0936   GLUCOSE 98 01/05/2015 1255   GLUCOSE 98 01/07/2012 0936   BUN 18.7 01/05/2015 1255   BUN 16 01/07/2012 0936   CREATININE 1.0 01/05/2015 1255   CREATININE 1.0 01/07/2012 0936   CALCIUM 9.1 01/05/2015 1255   CALCIUM 9.3 01/07/2012 0936   PROT 6.1* 01/05/2015 1255   PROT 7.2 01/07/2012 0936   ALBUMIN 3.0* 01/05/2015 1255    ALBUMIN 4.0 01/07/2012 0936   AST 24 01/05/2015 1255   AST 47* 01/07/2012 0936   ALT 11 01/05/2015 1255   ALT 28 01/07/2012 0936   ALKPHOS 84 01/05/2015 1255   ALKPHOS 59 01/07/2012 0936   BILITOT 0.66 01/05/2015 1255   BILITOT 1.0 01/07/2012 0936   GFRNONAA 81.57 12/12/2009 0000   GFRAA 121 08/09/2008 1103     Lab Results  Component Value Date   TSH 1.795 01/05/2015    Radiographic Findings: Ct Soft Tissue Neck W Contrast  01/04/2015   ADDENDUM REPORT: 01/04/2015 13:53  ADDENDUM: Study discussed by telephone with [facility] on 01/04/2015 at 1340 hours.   Electronically  Signed   By: Genevie Ann M.D.   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 Radiation therapy completed in April. Right side jaw swelling, xerostomia. Subsequent encounter.  EXAM: CT NECK WITH CONTRAST  TECHNIQUE: Multidetector CT imaging of the neck was performed using the standard protocol following the bolus administration of intravenous contrast.  CONTRAST:  114mL OMNIPAQUE IOHEXOL 300 MG/ML SOLN in conjunction with contrast enhanced imaging of the chest reported separately.  COMPARISON:  Pretreatment neck CT 06/05/2014.  FINDINGS: Pharynx and larynx:  The patient is now status post partial right maxillectomy with myocutaneous muscle flap. Residual hard palate with incidental torus palatinus appears within normal limits. There is mild uniform appearing soft tissue thickening along the mucosal surface of the entire flap (e.g. Series 4, image 35). See also mandible findings below.  The tongue appears within normal limits. Postoperative pharyngeal contour is within normal limits. Sequelae of XRT with generalized pharyngeal mucosal space soft tissue thickening. Laryngeal contours are within normal limits.  Salivary glands: The right submandibular gland now is surgically absent. The left  submandibular and parotid glands have a normal post XRT appearance. The right sublingual gland also appears partially atrophied, the left appears within normal limits. Negative sublingual space.  Thyroid: Negative.  Lymph nodes: No right side cervical lymphadenopathy or definite discrete lymph nodes are identified. Left side cervical nodes are within normal limits, the largest at the left level IIa station is 7 mm short axis.  Vascular: Major vascular structures in the neck and at the skullbase are patent. The left vertebral artery is dominant.  Limited intracranial: Negative.  Visualized orbits: Postoperative changes to both globes re- identified.  Mastoids and visualized paranasal sinuses: Mild to moderate residual maxillary sinus mucosal thickening. New right mastoid effusion and partial opacification of the right tympanic cavity. Moderate left sphenoid and visible ethmoid sinus mucosal thickening.  Skeleton: Pathologic fracture of the right mandible ramus (sagittal image 18) with abnormal permeative bone mineralization involving a a 3-4 cm segment of the ramus and angle. The right TMJ is dislocated anteriorly (same image). The right mandible condyles appears intact with normal mineralization.  The anterior and right mandible remain intact and have a normal appearance.  Advanced degenerative changes re - identified in the cervical spine. No other acute or suspicious osseous lesion identified in the neck.  Upper chest: Reported separately today.  IMPRESSION: 1. Pathologic fracture of the right mandible ramus and angle. This could be secondary to radiation osteonecrosis, or less likely osseous metastatic disease. Associated anterior dislocation of the right TMJ. 2. Status post partial right maxillectomy with large myocutaneous muscle flap. Status post XRT. This study will serve as a new postoperative baseline, but no residual oral cavity tumor or lymphadenopathy is identified. 3. Chest CT findings from today are  reported separately.  Electronically Signed: By: Genevie Ann M.D. On: 01/04/2015 13:16   Ct Chest W Contrast  01/04/2015   CLINICAL DATA:  History of right retromolar trigone squamous cell carcinoma  EXAM: CT CHEST WITH CONTRAST  TECHNIQUE: Multidetector CT imaging of the chest was performed during intravenous contrast administration.  CONTRAST:  160mL OMNIPAQUE IOHEXOL 300 MG/ML  SOLN  COMPARISON:  06/06/2014  FINDINGS: Mediastinum: Post therapeutic changes are identified within the floor of mouth. No enlarged supraclavicular or axillary lymph nodes. The heart size appears normal. No pericardial effusion. The trachea appears patent and is  midline. Unremarkable appearance of the esophagus. Small mediastinal lymph nodes are identified. No adenopathy.  Lungs/Pleura: No pleural effusions. Pulmonary nodule involving the right lower lobe measures 2 cm. This is unchanged in appearance when compared with 06/21/2003 and is favored to represent a benign process. Scarring is noted within the surrounding right lower lobe. No suspicious pulmonary nodule or mass identified.  Upper Abdomen: There is no suspicious liver abnormality identified. The adrenal glands are normal. The visualized portions of the pancreas and spleen are normal.  Musculoskeletal: I. There is mild degenerative disc disease within the thoracic spine.  IMPRESSION: 1. Stable CT of the chest. There are no specific features identified to suggest metastatic disease. 2. Pulmonary nodule in the right base has remained stable since 2004 and is compatible with a benign process.   Electronically Signed   By: Kerby Moors M.D.   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 He has some other more pressing issues:  MAIN COMPLAINTS TODAY INCLUDE weakness, fatigue, LE edema, and particularly,  gross hematuria.  He's not seen his PCP for a while.  2) Nutritional Status: Pt states his appetite levels are low. - weight: weight stable  - PEG tube: none  3) Risk Factors: The patient has been educated about risk factors including alcohol and tobacco abuse; they understand that avoidance of alcohol and tobacco is important to prevent recurrences as well as other cancers  4) Swallowing: Pt has difficulty chewing, but sticks to smooth foods.  5) Dental: Encouraged to continue regular followup with dentistry, and dental hygiene including fluoride rinses.  Discuss ? osteoradionecrosis with Dr Enrique Sack at tumor board.  6) Thyroid function: normal baseline, did not receive enough RT to likely cause hypothyroidism - rechecked lab today due to sx: Lab Results  Component Value Date   TSH 1.795 01/05/2015    7) Social: No active social issues to address at this time    8) Other: I told the pt that he should resume taking his hydrochlorothiazide at the same dosage he was prescribed.   The pt will be referred to urology for the blood in his urine -- we will call urology (making referral to them for hematuria) to see if they'd like preliminary workup testing to be ordered. I advised the pt to obtain the "pellets" that he passes while urination and present them to urology.  After visit, I asked my staff to call pt to instruct him to hold Aspirin for now given his bleeding.  I will have the pt schedule labs this afternoon to see if he is anemic. ( -->unremarkable) CMP also did not explain his malaise,fatigue  I will call in the pt's pharmacy ([facility] on [address]) to prescribe Fluconazole for the pt's thrush.  The pt should make an appointment soon with his pcp given his medical issues and complaints that are unlikely to be fully explained given his oncologic treatments.  9) Schedule f/u in September. The patient was encouraged to call with any issues or questions before  then.  Sending CDs of imaging to Dr Macky Lower.  This document serves as a record of services personally performed by Eppie Gibson, MD. It was created on her behalf by Darcus Austin, a trained medical scribe. The creation of this record is based on the scribe's personal observations and the  provider's statements to them. This document has been checked and approved by the attending provider.      _____________________________   Eppie Gibson, MDuri

## 2015-01-08 ENCOUNTER — Ambulatory Visit: Payer: Medicare Other

## 2015-01-08 ENCOUNTER — Telehealth: Payer: Self-pay | Admitting: *Deleted

## 2015-01-08 ENCOUNTER — Ambulatory Visit: Payer: Self-pay

## 2015-01-08 NOTE — Telephone Encounter (Signed)
CALLED PATIENT TO INFORM OF LAB, SCAN AND FU, SPOKE WITH PATIENT AND HE IS AWARE OF THESE APPTS. 

## 2015-01-09 ENCOUNTER — Telehealth: Payer: Self-pay | Admitting: *Deleted

## 2015-01-09 NOTE — Telephone Encounter (Signed)
CALLED PATIENT TO INFORM THAT LAB, CT HAVE BEEN CANCELLED PER DR. SQUIRE, AND WILL BE DONE ON 01-11-15 @ DR. NESI' OFFICE, SPOKE WITH PATIENT AND HE IS AWARE OF THESE APPT. CHANGES

## 2015-01-10 ENCOUNTER — Ambulatory Visit: Payer: Medicare Other

## 2015-01-11 ENCOUNTER — Ambulatory Visit: Payer: Self-pay | Admitting: Internal Medicine

## 2015-01-11 ENCOUNTER — Encounter: Payer: Self-pay | Admitting: Internal Medicine

## 2015-01-11 ENCOUNTER — Ambulatory Visit (HOSPITAL_COMMUNITY): Payer: Medicare Other

## 2015-01-11 ENCOUNTER — Ambulatory Visit (INDEPENDENT_AMBULATORY_CARE_PROVIDER_SITE_OTHER): Payer: Medicare Other | Admitting: Internal Medicine

## 2015-01-11 VITALS — BP 124/70 | HR 57 | Temp 98.0°F | Resp 20 | Ht 65.0 in | Wt 148.0 lb

## 2015-01-11 DIAGNOSIS — M5440 Lumbago with sciatica, unspecified side: Secondary | ICD-10-CM

## 2015-01-11 DIAGNOSIS — I10 Essential (primary) hypertension: Secondary | ICD-10-CM

## 2015-01-11 DIAGNOSIS — C062 Malignant neoplasm of retromolar area: Secondary | ICD-10-CM

## 2015-01-11 DIAGNOSIS — E119 Type 2 diabetes mellitus without complications: Secondary | ICD-10-CM | POA: Diagnosis not present

## 2015-01-11 DIAGNOSIS — R600 Localized edema: Secondary | ICD-10-CM

## 2015-01-11 DIAGNOSIS — R310 Gross hematuria: Secondary | ICD-10-CM

## 2015-01-11 DIAGNOSIS — N390 Urinary tract infection, site not specified: Secondary | ICD-10-CM | POA: Diagnosis not present

## 2015-01-11 MED ORDER — FUROSEMIDE 20 MG PO TABS: 20.0000 mg | ORAL_TABLET | Freq: Every day | ORAL | Status: DC

## 2015-01-11 NOTE — Progress Notes (Signed)
Subjective:    Patient ID: Timothy Kim, male    DOB: [birth year], 81 y.o.   MRN: [id number]  HPI 81 year old patient with multiple medical issues. 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  He has been followed closely by radiation oncology and oral surgery He has been evaluated by urology earlier today due to intermittent gross hematuria.  This started in mid April.  He is scheduled for abdominal pelvic CT scanning.  Next week. He remains weak with poor appetite.  He forces himself to eat with protein/ensure shakes.  He has some chewing difficulty due to loss of some dentition but no real swallowing difficulty.  There is been significant weight loss due to the anorexia His chief complaint is worsening lower extremity edema over the past 2 weeks.  He has been on hydrochlorothiazide in the past due to hypertension but this was discontinued until 6 days ago when it was resumed due to the peripheral edema.  No shortness of breath, PND or orthopnea  Wt Readings from Last 3 Encounters:  01/11/15 148 lb (67.132 kg)  01/05/15 151 lb 14.4 oz (68.901 kg)  11/22/14 149 lb (67.586 kg)   Past Medical History  Diagnosis Date  . ALLERGIC RHINITIS 02/09/2008    Qualifier: Diagnosis of  By: Burnice Logan  MD, [place] HYPERTROPHY 03/03/2007    Qualifier: Diagnosis of  By: Rogue Bussing CMA, Maryann Alar    . GERD 03/03/2007    Qualifier: Diagnosis of  By: Rogue Bussing CMA, Maryann Alar    . HYPERTENSION 03/03/2007    Qualifier: Diagnosis of  By: Rogue Bussing CMA, Maryann Alar    . LOW BACK PAIN 06/09/2007    Qualifier: Diagnosis of  By: Burnice Logan  MD, Doretha Sou   . 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 OSTEOARTHRITIS 06/09/2007    Qualifier: Diagnosis of  By: Burnice Logan  MD, Doretha Sou   . Glaucoma   . Radiation 09/04/14-10/13/14    right retromolar region and right neck 60 Gy  .  CLL (chronic lymphocytic leukemia)     Stage 1  . Tonsillar cancer     Right Tonsil, invasive squamous cell  . Cancer 05/2014    tonsil  . DIABETES MELLITUS, TYPE II 03/03/2007    Qualifier: Diagnosis of  By: Rogue Bussing CMA, Jacqualynn      History   Social History  . Marital Status: Single    Spouse Name: N/A  . Number of Children: 2  . Years of Education: N/A   Occupational History  . Not on file.   Social History Main Topics  . Smoking status: Never Smoker   . Smokeless tobacco: Never Used  . Alcohol Use: 1.8 - 2.4 oz/week    3-4 Shots of liquor per week     Comment: Gin and vodka daily  . Drug Use: No  . Sexual Activity: Not on file   Other Topics Concern  . Not on file   Social History Narrative   Patient is divorced with 2 children.   Patient has never smoked. Patient has never used smokeless tobacco.    Patient drinks gin or vodka on a daily basis.    Past Surgical History  Procedure Laterality Date  . Hernia repair      inguinal  . Rotator cuff repair    . Laminectomy    . Retinal detachment surgery    .  Cataract extraction    . Tonsil biopsy Right 05/24/14  . Maxillectomy  07/21/14, [place]    RSND, R rim mandibulectomy, dental extractions, r parascauplar flap reconstruction    Family History  Problem Relation Age of Onset  . Cancer Mother     lung ca  . Alcohol abuse Sister   . Hypertension Neg Hx     family hx  . Sudden death Neg Hx     famiylhx    No Known Allergies  Current Outpatient Prescriptions on File Prior to Visit  Medication Sig Dispense Refill  . aspirin 325 MG tablet Take 325 mg by mouth.    . brimonidine-timolol (COMBIGAN) 0.2-0.5 % ophthalmic solution Apply 1 drop to eye.    Marland Kitchen DHEA 50 MG TABS Take 1 tablet by mouth daily.    . fluconazole (DIFLUCAN) 100 MG tablet Take 2 tablets today, then 1 tablet daily x 13 more days. 15 tablet 0  . Krill Oil 300 MG CAPS Take 300 mg by mouth daily.    Marland Kitchen latanoprost (XALATAN) 0.005 %  ophthalmic solution   4  . Lutein-Zeaxanthin 15-0.7 MG CAPS Take 1 tablet by mouth daily.    . metoprolol succinate (TOPROL-XL) 100 MG 24 hr tablet TAKE HALF TABLET IMMEDIATELY FOLLOWING A MEAL.  Currently taking 50mg  tab per his PC 90 tablet 3  . sodium chloride (OCEAN) 0.65 % nasal spray Place 2 sprays into the nose.    . sodium fluoride (PREVIDENT 5000 PLUS) 1.1 % CREA dental cream Apply thin ribbon of cream to tooth brush. Brush teeth for 2 minutes. Spit out excess. Repeat nightly. 1 Tube prn  . tadalafil (CIALIS) 20 MG tablet Take 1 tablet (20 mg total) by mouth daily as needed for erectile dysfunction. 10 tablet 3  . triamcinolone cream (KENALOG) 0.1 % APPLY TO AFFECTED AREA TWICE DAILY 80 g 1  . zolpidem (AMBIEN) 5 MG tablet TAKE 1 TABLET AT BEDTIME AS NEEDED FOR SLEEP 30 tablet 2  . sildenafil (VIAGRA) 100 MG tablet Take 1 tablet (100 mg total) by mouth as needed for erectile dysfunction. (Patient not taking: Reported on 06/07/2014) 3 tablet 6   No current facility-administered medications on file prior to visit.    BP 124/70 mmHg  Pulse 57  Temp(Src) 98 F (36.7 C) (Oral)  Resp 20  Ht 5\' 5"  (1.651 m)  Wt 148 lb (67.132 kg)  BMI 24.63 kg/m2  SpO2 98%      Review of Systems  Constitutional: Positive for activity change, appetite change, fatigue and unexpected weight change. Negative for fever and chills.  HENT: Negative for congestion, dental problem, ear pain, hearing loss, sore throat, tinnitus, trouble swallowing and voice change.   Eyes: Negative for pain, discharge and visual disturbance.  Respiratory: Negative for cough, chest tightness, wheezing and stridor.   Cardiovascular: Positive for leg swelling. Negative for chest pain and palpitations.  Gastrointestinal: Negative for nausea, vomiting, abdominal pain, diarrhea, constipation, blood in stool and abdominal distention.  Genitourinary: Positive for hematuria. Negative for urgency, flank pain, discharge, difficulty  urinating and genital sores.  Musculoskeletal: Negative for myalgias, back pain, joint swelling, arthralgias, gait problem and neck stiffness.  Skin: Negative for rash.  Neurological: Positive for weakness. Negative for dizziness, syncope, speech difficulty, numbness and headaches.  Hematological: Negative for adenopathy. Does not bruise/bleed easily.  Psychiatric/Behavioral: Negative for behavioral problems and dysphoric mood. The patient is not nervous/anxious.        Objective:   Physical Exam  Constitutional:  He is oriented to person, place, and time. He appears well-developed.  Blood pressure 120/70 Appears chronically ill  HENT:  Head: Normocephalic.  Right Ear: External ear normal.  Left Ear: External ear normal.  Right facial swelling   Eyes: Conjunctivae and EOM are normal.  Neck: Normal range of motion.  Cardiovascular: Normal rate and normal heart sounds.   Pulmonary/Chest: Breath sounds normal.  Abdominal: Bowel sounds are normal.  Musculoskeletal: Normal range of motion. He exhibits no edema or tenderness.  Plus 3 edema distal legs, feet and ankles  Neurological: He is alert and oriented to person, place, and time.  Psychiatric: He has a normal mood and affect. His behavior is normal.          Assessment & Plan:   No extremity edema multi-factorial.  Patient has a history of mild chronic edema, probably secondary to venous insufficiency.  This has been aggravated by malnutrition with hypoalbuminemia as well as discontinuation of hydrochlorothiazide We'll encourage elevation and a low-salt diet.  Will give a prescription for Lasix that he will use sparingly.  He is aware of the risk of dehydration and electrolyte abnormalities.  He will consider support hose  Essential hypertension.  Stable off medication Diet-controlled diabetes Gross hematuria.  Urology follow-up Squamous cell cancer of the right retromolar trigone.  Follow-up oral surgery and radiation  oncology

## 2015-01-11 NOTE — Progress Notes (Signed)
Pre visit review using our clinic review tool, if applicable. No additional management support is needed unless otherwise documented below in the visit note. 

## 2015-01-11 NOTE — Patient Instructions (Signed)
Limit your sodium (Salt) intake  Peripheral Edema You have swelling in your legs (peripheral edema). This swelling is due to excess accumulation of salt and water in your body. Edema may be a sign of heart, kidney or liver disease, or a side effect of a medication. It may also be due to problems in the leg veins. Elevating your legs and using special support stockings may be very helpful, if the cause of the swelling is due to poor venous circulation. Avoid long periods of standing, whatever the cause. Treatment of edema depends on identifying the cause. Chips, pretzels, pickles and other salty foods should be avoided. Restricting salt in your diet is almost always needed. Water pills (diuretics) are often used to remove the excess salt and water from your body via urine. These medicines prevent the kidney from reabsorbing sodium. This increases urine flow. Diuretic treatment may also result in lowering of potassium levels in your body. Potassium supplements may be needed if you have to use diuretics daily. Daily weights can help you keep track of your progress in clearing your edema. You should call your caregiver for follow up care as recommended. SEEK IMMEDIATE MEDICAL CARE IF:   You have increased swelling, pain, redness, or heat in your legs.  You develop shortness of breath, especially when lying down.  You develop chest or abdominal pain, weakness, or fainting.  You have a fever. Document Released: 07/17/2004 Document Revised: 09/01/2011 Document Reviewed: 06/27/2009 Timothy Kim Patient Information 2015 [place], [place]. This information is not intended to replace advice given to you by your health care provider. Make sure you discuss any questions you have with your health care provider. Low-Sodium Eating Plan Sodium raises blood pressure and causes water to be held in the body. Getting less sodium from food will help lower your blood pressure, reduce any swelling, and protect your heart,  liver, and kidneys. We get sodium by adding salt (sodium chloride) to food. Most of our sodium comes from canned, boxed, and frozen foods. Restaurant foods, fast foods, and pizza are also very high in sodium. Even if you take medicine to lower your blood pressure or to reduce fluid in your body, getting less sodium from your food is important. WHAT IS MY PLAN? Most people should limit their sodium intake to 2,300 mg a day. Your health care provider recommends that you limit your sodium intake to __________ a day.  WHAT DO I NEED TO KNOW ABOUT THIS EATING PLAN? For the low-sodium eating plan, you will follow these general guidelines:  Choose foods with a % Daily Value for sodium of less than 5% (as listed on the food label).   Use salt-free seasonings or herbs instead of table salt or sea salt.   Check with your health care provider or pharmacist before using salt substitutes.   Eat fresh foods.  Eat more vegetables and fruits.  Limit canned vegetables. If you do use them, rinse them well to decrease the sodium.   Limit cheese to 1 oz (28 g) per day.   Eat lower-sodium products, often labeled as "lower sodium" or "no salt added."  Avoid foods that contain monosodium glutamate (MSG). MSG is sometimes added to [place] food and some canned foods.  Check food labels (Nutrition Facts labels) on foods to learn how much sodium is in one serving.  Eat more home-cooked food and less restaurant, buffet, and fast food.  When eating at a restaurant, ask that your food be prepared with less salt or none,  if possible.  HOW DO I READ FOOD LABELS FOR SODIUM INFORMATION? The Nutrition Facts label lists the amount of sodium in one serving of the food. If you eat more than one serving, you must multiply the listed amount of sodium by the number of servings. Food labels may also identify foods as:  Sodium free--Less than 5 mg in a serving.  Very low sodium--35 mg or less in a serving.  Low  sodium--140 mg or less in a serving.  Light in sodium--50% less sodium in a serving. For example, if a food that usually has 300 mg of sodium is changed to become light in sodium, it will have 150 mg of sodium.  Reduced sodium--25% less sodium in a serving. For example, if a food that usually has 400 mg of sodium is changed to reduced sodium, it will have 300 mg of sodium. WHAT FOODS CAN I EAT? Grains Low-sodium cereals, including oats, puffed wheat and rice, and shredded wheat cereals. Low-sodium crackers. Unsalted rice and pasta. Lower-sodium bread.  Vegetables Frozen or fresh vegetables. Low-sodium or reduced-sodium canned vegetables. Low-sodium or reduced-sodium tomato sauce and paste. Low-sodium or reduced-sodium tomato and vegetable juices.  Fruits Fresh, frozen, and canned fruit. Fruit juice.  Meat and Other Protein Products Low-sodium canned tuna and salmon. Fresh or frozen meat, poultry, seafood, and fish. Lamb. Unsalted nuts. Dried beans, peas, and lentils without added salt. Unsalted canned beans. Homemade soups without salt. Eggs.  Dairy Milk. Soy milk. Ricotta cheese. Low-sodium or reduced-sodium cheeses. Yogurt.  Condiments Fresh and dried herbs and spices. Salt-free seasonings. Onion and garlic powders. Low-sodium varieties of mustard and ketchup. Lemon juice.  Fats and Oils Reduced-sodium salad dressings. Unsalted butter.  Other Unsalted popcorn and pretzels.  The items listed above may not be a complete list of recommended foods or beverages. Contact your dietitian for more options. WHAT FOODS ARE NOT RECOMMENDED? Grains Instant hot cereals. Bread stuffing, pancake, and biscuit mixes. Croutons. Seasoned rice or pasta mixes. Noodle soup cups. Boxed or frozen macaroni and cheese. Self-rising flour. Regular salted crackers. Vegetables Regular canned vegetables. Regular canned tomato sauce and paste. Regular tomato and vegetable juices. Frozen vegetables in sauces.  Salted french fries. Olives. Timothy Kim. Relishes. Sauerkraut. Salsa. Meat and Other Protein Products Salted, canned, smoked, spiced, or pickled meats, seafood, or fish. Bacon, ham, sausage, hot dogs, corned beef, chipped beef, and packaged luncheon meats. Salt pork. Jerky. Pickled herring. Anchovies, regular canned tuna, and sardines. Salted nuts. Dairy Processed cheese and cheese spreads. Cheese curds. Blue cheese and cottage cheese. Buttermilk.  Condiments Onion and garlic salt, seasoned salt, table salt, and sea salt. Canned and packaged gravies. Worcestershire sauce. Tartar sauce. Barbecue sauce. Teriyaki sauce. Soy sauce, including reduced sodium. Steak sauce. Fish sauce. Oyster sauce. Cocktail sauce. Horseradish. Regular ketchup and mustard. Meat flavorings and tenderizers. Bouillon cubes. Hot sauce. Tabasco sauce. Marinades. Taco seasonings. Relishes. Fats and Oils Regular salad dressings. Salted butter. Margarine. Ghee. Bacon fat.  Other Potato and tortilla chips. Corn chips and puffs. Salted popcorn and pretzels. Canned or dried soups. Pizza. Frozen entrees and pot pies.  The items listed above may not be a complete list of foods and beverages to avoid. Contact your dietitian for more information. Document Released: 11/29/2001 Document Revised: 06/14/2013 Document Reviewed: 04/13/2013 Timothy Kim Patient Information 2015 [place], [place]. This information is not intended to replace advice given to you by your health care provider. Make sure you discuss any questions you have with your health care provider.

## 2015-01-16 DIAGNOSIS — R310 Gross hematuria: Secondary | ICD-10-CM | POA: Diagnosis not present

## 2015-01-16 DIAGNOSIS — N3289 Other specified disorders of bladder: Secondary | ICD-10-CM | POA: Diagnosis not present

## 2015-01-16 DIAGNOSIS — N200 Calculus of kidney: Secondary | ICD-10-CM | POA: Diagnosis not present

## 2015-01-22 DIAGNOSIS — D494 Neoplasm of unspecified behavior of bladder: Secondary | ICD-10-CM | POA: Diagnosis not present

## 2015-01-22 DIAGNOSIS — R310 Gross hematuria: Secondary | ICD-10-CM | POA: Diagnosis not present

## 2015-01-24 ENCOUNTER — Other Ambulatory Visit: Payer: Self-pay | Admitting: Internal Medicine

## 2015-01-25 ENCOUNTER — Encounter: Payer: Self-pay | Admitting: Internal Medicine

## 2015-01-26 ENCOUNTER — Other Ambulatory Visit: Payer: Self-pay | Admitting: Urology

## 2015-01-29 ENCOUNTER — Encounter (HOSPITAL_BASED_OUTPATIENT_CLINIC_OR_DEPARTMENT_OTHER): Payer: Self-pay | Admitting: *Deleted

## 2015-01-29 NOTE — Progress Notes (Signed)
   01/29/15 1204  [place]  Have you ever been diagnosed with sleep apnea through a sleep study? No  Do you snore loudly (loud enough to be heard through closed doors)?  1  Do you often feel tired, fatigued, or sleepy during the daytime? 0  Has anyone observed you stop breathing during your sleep? 0  Do you have, or are you being treated for high blood pressure? 1  BMI more than 35 kg/m2? 0  Age over 79 years old? 1  Gender: 1  Obstructive Sleep Apnea Score 4

## 2015-01-29 NOTE — Progress Notes (Signed)
NPO AFTER MN.  ARRIVE AT 0930.  NEEDS ISTAT AND EKG.  WILL TAKE TOPROL AM DOS W/ SIPS OF WATER.  HAD REVIEWED CHART W/ DR DENENNY MDA , NOTED PT HAD RADICAL NECK DISSECTION AND RADIATION IN THE PAST 6 MONTHS,  DR DENENNY STATED ASK PT HOW HE FELT ABOUT HAVING A SPINAL FOR ANESTHESIA AND IF ANY PAIN OR ISSUES WITH RIGHT JAW OTHERWISE PT IS OK TO PROCEED.  ASKED PT ABOUT SPINAL ANES. HE STATED THAT WAS OK JUST CONCERNED ABOUT DDD LUMBAR AND HIS JAW DOES GIVE HIM PAIN ONLY DISCOMFORT IF EATING CHEWY FOOD WHICH HE AVOIDS.

## 2015-02-02 ENCOUNTER — Ambulatory Visit (HOSPITAL_BASED_OUTPATIENT_CLINIC_OR_DEPARTMENT_OTHER)
Admission: RE | Admit: 2015-02-02 | Discharge: 2015-02-02 | Disposition: A | Discharge status: Home / Self Care | Payer: Medicare Other | Source: Ambulatory Visit | Attending: Urology | Admitting: Urology

## 2015-02-02 ENCOUNTER — Encounter (HOSPITAL_BASED_OUTPATIENT_CLINIC_OR_DEPARTMENT_OTHER): Payer: Self-pay

## 2015-02-02 ENCOUNTER — Encounter (HOSPITAL_BASED_OUTPATIENT_CLINIC_OR_DEPARTMENT_OTHER)
Admission: RE | Disposition: A | Discharge status: Home / Self Care | Payer: Self-pay | Source: Ambulatory Visit | Attending: Urology

## 2015-02-02 ENCOUNTER — Ambulatory Visit (HOSPITAL_BASED_OUTPATIENT_CLINIC_OR_DEPARTMENT_OTHER): Payer: Medicare Other | Admitting: Anesthesiology

## 2015-02-02 DIAGNOSIS — H3530 Unspecified macular degeneration: Secondary | ICD-10-CM | POA: Insufficient documentation

## 2015-02-02 DIAGNOSIS — N3289 Other specified disorders of bladder: Secondary | ICD-10-CM | POA: Diagnosis not present

## 2015-02-02 DIAGNOSIS — E119 Type 2 diabetes mellitus without complications: Secondary | ICD-10-CM | POA: Diagnosis not present

## 2015-02-02 DIAGNOSIS — D649 Anemia, unspecified: Secondary | ICD-10-CM | POA: Diagnosis not present

## 2015-02-02 DIAGNOSIS — C679 Malignant neoplasm of bladder, unspecified: Secondary | ICD-10-CM | POA: Diagnosis not present

## 2015-02-02 DIAGNOSIS — H409 Unspecified glaucoma: Secondary | ICD-10-CM | POA: Diagnosis not present

## 2015-02-02 DIAGNOSIS — Z8589 Personal history of malignant neoplasm of other organs and systems: Secondary | ICD-10-CM | POA: Insufficient documentation

## 2015-02-02 DIAGNOSIS — N329 Bladder disorder, unspecified: Secondary | ICD-10-CM | POA: Diagnosis present

## 2015-02-02 DIAGNOSIS — C678 Malignant neoplasm of overlapping sites of bladder: Secondary | ICD-10-CM | POA: Diagnosis not present

## 2015-02-02 DIAGNOSIS — Z7982 Long term (current) use of aspirin: Secondary | ICD-10-CM | POA: Insufficient documentation

## 2015-02-02 DIAGNOSIS — E039 Hypothyroidism, unspecified: Secondary | ICD-10-CM | POA: Insufficient documentation

## 2015-02-02 DIAGNOSIS — Z923 Personal history of irradiation: Secondary | ICD-10-CM | POA: Diagnosis not present

## 2015-02-02 DIAGNOSIS — N529 Male erectile dysfunction, unspecified: Secondary | ICD-10-CM | POA: Diagnosis not present

## 2015-02-02 DIAGNOSIS — I10 Essential (primary) hypertension: Secondary | ICD-10-CM | POA: Insufficient documentation

## 2015-02-02 DIAGNOSIS — M4802 Spinal stenosis, cervical region: Secondary | ICD-10-CM | POA: Diagnosis not present

## 2015-02-02 DIAGNOSIS — K219 Gastro-esophageal reflux disease without esophagitis: Secondary | ICD-10-CM | POA: Diagnosis not present

## 2015-02-02 DIAGNOSIS — I739 Peripheral vascular disease, unspecified: Secondary | ICD-10-CM | POA: Diagnosis not present

## 2015-02-02 DIAGNOSIS — M1990 Unspecified osteoarthritis, unspecified site: Secondary | ICD-10-CM | POA: Diagnosis not present

## 2015-02-02 DIAGNOSIS — N400 Enlarged prostate without lower urinary tract symptoms: Secondary | ICD-10-CM | POA: Insufficient documentation

## 2015-02-02 DIAGNOSIS — N200 Calculus of kidney: Secondary | ICD-10-CM | POA: Diagnosis not present

## 2015-02-02 HISTORY — PX: CYSTOSCOPY: SHX5120

## 2015-02-02 HISTORY — DX: Unspecified macular degeneration: H35.30

## 2015-02-02 HISTORY — DX: Other cervical disc degeneration, unspecified cervical region: M50.30

## 2015-02-02 HISTORY — DX: Other specified disorders of temporomandibular joint: M26.69

## 2015-02-02 HISTORY — DX: Venous insufficiency (chronic) (peripheral): I87.2

## 2015-02-02 HISTORY — DX: Male erectile dysfunction, unspecified: N52.9

## 2015-02-02 HISTORY — DX: Benign prostatic hyperplasia without lower urinary tract symptoms: N40.0

## 2015-02-02 HISTORY — PX: TRANSURETHRAL RESECTION OF BLADDER TUMOR: SHX2575

## 2015-02-02 HISTORY — DX: Anemia in neoplastic disease: D63.0

## 2015-02-02 HISTORY — DX: Other intervertebral disc degeneration, lumbosacral region without mention of lumbar back pain or lower extremity pain: M51.379

## 2015-02-02 HISTORY — DX: Prediabetes: R73.03

## 2015-02-02 HISTORY — DX: Personal history of other diseases of the digestive system: Z87.19

## 2015-02-02 HISTORY — DX: Localized edema: R60.0

## 2015-02-02 HISTORY — DX: Personal history of irradiation: Z92.3

## 2015-02-02 HISTORY — DX: Other intervertebral disc degeneration, lumbosacral region: M51.37

## 2015-02-02 HISTORY — DX: Unspecified osteoarthritis, unspecified site: M19.90

## 2015-02-02 HISTORY — DX: Other specified postprocedural states: Z98.890

## 2015-02-02 HISTORY — DX: Alcohol abuse, uncomplicated: F10.10

## 2015-02-02 HISTORY — DX: Radiodermatitis, unspecified: L58.9

## 2015-02-02 HISTORY — DX: Dysphasia: R47.02

## 2015-02-02 HISTORY — DX: Solitary pulmonary nodule: R91.1

## 2015-02-02 HISTORY — DX: Malignant neoplasm of retromolar area: C06.2

## 2015-02-02 HISTORY — DX: Essential (primary) hypertension: I10

## 2015-02-02 HISTORY — DX: Allergic rhinitis, unspecified: J30.9

## 2015-02-02 LAB — POCT I-STAT 4, (NA,K, GLUC, HGB,HCT)
Glucose, Bld: 114 mg/dL — ABNORMAL HIGH (ref 65–99)
HCT: 42 % (ref 39.0–52.0)
Hemoglobin: 14.3 g/dL (ref 13.0–17.0)
POTASSIUM: 3.9 mmol/L (ref 3.5–5.1)
Sodium: 134 mmol/L — ABNORMAL LOW (ref 135–145)

## 2015-02-02 SURGERY — TURBT (TRANSURETHRAL RESECTION OF BLADDER TUMOR)
Anesthesia: General | Site: Bladder

## 2015-02-02 MED ORDER — EPHEDRINE SULFATE 50 MG/ML IJ SOLN
INTRAMUSCULAR | Status: DC | PRN
  Administered 2015-02-02: 10 mg via INTRAVENOUS
  Administered 2015-02-02: 5 mg via INTRAVENOUS

## 2015-02-02 MED ORDER — HYDROCODONE-ACETAMINOPHEN 5-325 MG PO TABS: 1.0000 | ORAL_TABLET | Freq: Four times a day (QID) | ORAL | Status: DC | PRN

## 2015-02-02 MED ORDER — FENTANYL CITRATE (PF) 100 MCG/2ML IJ SOLN
INTRAMUSCULAR | Status: DC | PRN
  Administered 2015-02-02 (×4): 25 ug via INTRAVENOUS

## 2015-02-02 MED ORDER — PROPOFOL 10 MG/ML IV BOLUS
INTRAVENOUS | Status: DC | PRN
  Administered 2015-02-02: 130 mg via INTRAVENOUS

## 2015-02-02 MED ORDER — DEXAMETHASONE SODIUM PHOSPHATE 4 MG/ML IJ SOLN
INTRAMUSCULAR | Status: DC | PRN
  Administered 2015-02-02: 8 mg via INTRAVENOUS

## 2015-02-02 MED ORDER — FENTANYL CITRATE (PF) 100 MCG/2ML IJ SOLN
25.0000 ug | INTRAMUSCULAR | Status: DC | PRN
  Filled 2015-02-02: qty 1

## 2015-02-02 MED ORDER — CEFAZOLIN SODIUM-DEXTROSE 2-3 GM-% IV SOLR
2.0000 g | INTRAVENOUS | Status: AC
  Administered 2015-02-02: 2 g via INTRAVENOUS
  Filled 2015-02-02: qty 50

## 2015-02-02 MED ORDER — LIDOCAINE HCL (CARDIAC) 20 MG/ML IV SOLN
INTRAVENOUS | Status: DC | PRN
  Administered 2015-02-02: 60 mg via INTRAVENOUS

## 2015-02-02 MED ORDER — LACTATED RINGERS IV SOLN
INTRAVENOUS | Status: DC
  Administered 2015-02-02: 11:00:00 via INTRAVENOUS
  Filled 2015-02-02: qty 1000

## 2015-02-02 MED ORDER — MITOMYCIN CHEMO FOR BLADDER INSTILLATION 40 MG: 40.0000 mg | Freq: Once | INTRAVENOUS | Status: DC

## 2015-02-02 MED ORDER — ONDANSETRON HCL 4 MG/2ML IJ SOLN
INTRAMUSCULAR | Status: DC | PRN
  Administered 2015-02-02: 4 mg via INTRAVENOUS

## 2015-02-02 MED ORDER — MITOMYCIN CHEMO FOR BLADDER INSTILLATION 40 MG
40.0000 mg | Freq: Once | INTRAVENOUS | Status: DC
  Filled 2015-02-02: qty 40

## 2015-02-02 MED ORDER — SODIUM CHLORIDE 0.9 % IR SOLN
Status: DC | PRN
  Administered 2015-02-02: 18000 mL

## 2015-02-02 MED ORDER — FENTANYL CITRATE (PF) 100 MCG/2ML IJ SOLN
INTRAMUSCULAR | Status: AC
  Filled 2015-02-02: qty 4

## 2015-02-02 MED ORDER — CEFAZOLIN SODIUM 1-5 GM-% IV SOLN
1.0000 g | INTRAVENOUS | Status: DC
Start: 2015-02-02 — End: 2015-02-02
  Filled 2015-02-02: qty 50

## 2015-02-02 MED ORDER — MITOMYCIN CHEMO FOR BLADDER INSTILLATION 40 MG
INTRAVENOUS | Status: DC | PRN
  Administered 2015-02-02: 40 mg via INTRAVESICAL

## 2015-02-02 MED ORDER — CEFAZOLIN SODIUM-DEXTROSE 2-3 GM-% IV SOLR
INTRAVENOUS | Status: AC
  Filled 2015-02-02: qty 50

## 2015-02-02 MED ORDER — ACETAMINOPHEN 10 MG/ML IV SOLN
INTRAVENOUS | Status: DC | PRN
  Administered 2015-02-02: 1000 mg via INTRAVENOUS

## 2015-02-02 SURGICAL SUPPLY — 34 items
BAG DRAIN URO-CYSTO SKYTR STRL (DRAIN) ×3 IMPLANT
BAG URINE DRAINAGE (UROLOGICAL SUPPLIES) IMPLANT
BAG URINE LEG 19OZ MD ST LTX (BAG) IMPLANT
CANISTER SUCT LVC 12 LTR MEDI- (MISCELLANEOUS) IMPLANT
CATH FOLEY 2WAY SLVR  5CC 20FR (CATHETERS)
CATH FOLEY 2WAY SLVR  5CC 22FR (CATHETERS)
CATH FOLEY 2WAY SLVR  5CC 24FR (CATHETERS) ×2
CATH FOLEY 2WAY SLVR 5CC 20FR (CATHETERS) IMPLANT
CATH FOLEY 2WAY SLVR 5CC 22FR (CATHETERS) IMPLANT
CATH FOLEY 2WAY SLVR 5CC 24FR (CATHETERS) ×1 IMPLANT
CLOTH BEACON ORANGE TIMEOUT ST (SAFETY) ×3 IMPLANT
ELECT REM PT RETURN 9FT ADLT (ELECTROSURGICAL)
ELECTRODE REM PT RTRN 9FT ADLT (ELECTROSURGICAL) IMPLANT
EVACUATOR MICROVAS BLADDER (UROLOGICAL SUPPLIES) ×3 IMPLANT
GLOVE BIO SURGEON STRL SZ 6.5 (GLOVE) ×2 IMPLANT
GLOVE BIO SURGEON STRL SZ7 (GLOVE) ×3 IMPLANT
GLOVE BIO SURGEONS STRL SZ 6.5 (GLOVE) ×1
GLOVE BIOGEL PI IND STRL 6.5 (GLOVE) ×2 IMPLANT
GLOVE BIOGEL PI INDICATOR 6.5 (GLOVE) ×4
GOWN STRL REUS W/TWL LRG LVL3 (GOWN DISPOSABLE) ×6 IMPLANT
HOLDER FOLEY CATH W/STRAP (MISCELLANEOUS) ×3 IMPLANT
IV NS IRRIG 3000ML ARTHROMATIC (IV SOLUTION) ×18 IMPLANT
LOOP CUT BIPOLAR 24F LRG (ELECTROSURGICAL) ×3 IMPLANT
MANIFOLD NEPTUNE II (INSTRUMENTS) ×3 IMPLANT
NDL SAFETY ECLIPSE 18X1.5 (NEEDLE) IMPLANT
NEEDLE HYPO 18GX1.5 SHARP (NEEDLE)
NEEDLE HYPO 22GX1.5 SAFETY (NEEDLE) IMPLANT
NS IRRIG 500ML POUR BTL (IV SOLUTION) ×3 IMPLANT
PACK CYSTO (CUSTOM PROCEDURE TRAY) ×3 IMPLANT
PLUG CATH AND CAP STER (CATHETERS) ×3 IMPLANT
SET ASPIRATION TUBING (TUBING) IMPLANT
SYR 20CC LL (SYRINGE) IMPLANT
SYRINGE IRR TOOMEY STRL 70CC (SYRINGE) IMPLANT
WATER STERILE IRR 3000ML UROMA (IV SOLUTION) IMPLANT

## 2015-02-02 NOTE — H&P (Signed)
History of Present Illness Timothy Kim returns for follow-up. CT scan showed a 2 mm non obstructing right lower pole renal calculus and a 2.8 x 2.7 x 1.8 cm filling defect in the bladder. Cystoscopy reveals a 3 cm papillary tumor on the right side of the bladder above the ureteral orifice.    GU History:     Timothy Kim is referred by Dr Eppie Gibson for evaluation of gross hematuria. Timothy Kim has a history of retromolar squamous cell carcinoma treated with partial maxillectomy and radiation. He has been having gross painless hematuria on and off since 4/20. He has also passed what looks like grain of rice through the urine. He has not seen any air through the urine. He has not seen blood in the stools. He does not have a history of kidney stone. He has never smoked.   Past Medical History Problems  1. History of Degenerative cervical spinal stenosis (M48.02) 2. History of Fracture of ramus of mandible (S02.64XA) 3. History of arthritis (Z87.39) 4. History of glaucoma (Z86.69) 5. History of heartburn (Z87.898) 6. History of hypertension (Z86.79) 7. History of hypothyroidism (Z86.39) 8. History of Macular degeneration (H35.30) 9. History of Pulmonary nodule (R91.1) 10. History of Squamous cell carcinoma of retromolar trigone (C06.2)  Surgical History Problems  1. History of Back Surgery 2. History of Jaw Surgery 3. History of Repair Of Retinal Detachment With Macular Detachment 4. History of Rotator Cuff Repair  Current Meds 1. Aspirin Buffered 325 MG TABS;  Therapy: (Recorded:21Jul2016) to Recorded 2. Cialis 20 MG Oral Tablet;  Therapy: (Recorded:21Jul2016) to Recorded 3. Combigan SOLN;  Therapy: (Recorded:21Jul2016) to Recorded 4. DHEA CAPS;  Therapy: (Recorded:21Jul2016) to Recorded 5. Fluconazole 100 MG Oral Tablet;  Therapy: (Recorded:21Jul2016) to Recorded 6. Krill Oil 300 MG Oral Capsule;  Therapy: (Recorded:21Jul2016) to Recorded 7. Latanoprost 0.005 % Ophthalmic  Solution;  Therapy: (Recorded:21Jul2016) to Recorded 8. Lutein TABS;  Therapy: (Recorded:21Jul2016) to Recorded 9. Metoprolol Succinate ER 100 MG Oral Tablet Extended Release 24 Hour;  Therapy: (Recorded:21Jul2016) to Recorded 10. [place] Nasal Spray 0.65 % Nasal Solution;   Therapy: (Recorded:21Jul2016) to Recorded 11. PreviDent 5000 Plus CREA;   Therapy: (Recorded:21Jul2016) to Recorded 12. Sildenafil Citrate TABS;   Therapy: (Recorded:21Jul2016) to Recorded 13. Triamcinolone Acetonide 0.1 % External Cream;   Therapy: (Recorded:21Jul2016) to Recorded 14. Zolpidem Tartrate 5 MG Oral Tablet;   Therapy: (Recorded:21Jul2016) to Recorded  Allergies Medication  1. No Known Drug Allergies  Family History Problems  1. Family history of kidney stones (Z84.1) : Father 2. Family history of lung cancer (Z80.1) : Mother 3. Family history of macular degeneration (F81.829) : Mother  Social History Problems  1. Alcohol use (Z78.9) 2. Caffeine use (F15.90) 3. Never a smoker 4. Retired 37. Denied: History of Tobacco use  Review of Systems Genitourinary, constitutional, skin, eye, otolaryngeal, hematologic/lymphatic, cardiovascular, pulmonary, endocrine, musculoskeletal, gastrointestinal, neurological and psychiatric system(s) were reviewed and pertinent findings if present are noted and are otherwise negative.  Genitourinary: hematuria and erectile dysfunction.  Constitutional: feeling tired (fatigue) and recent ~Ulb weight loss.  Integumentary: skin rash/lesion and pruritus.  ENT: sinus problems.  Cardiovascular: leg swelling.  Musculoskeletal: back pain.    Physical Exam Constitutional: Well nourished and well developed . No acute distress.  ENT:. The ears and nose are normal in appearance.  Neck: The appearance of the neck is normal and no neck mass is present.  Pulmonary: No respiratory distress and normal respiratory rhythm and effort.  Cardiovascular: Heart rate and rhythm are  normal . No peripheral edema.  Abdomen: The abdomen is soft and nontender. No masses are palpated. No CVA tenderness. No hernias are palpable. No hepatosplenomegaly noted.  Rectal: Rectal exam demonstrates normal sphincter tone, no tenderness and no masses. Estimated prostate size is 2+. The prostate has no nodularity and is not tender. The left seminal vesicle is nonpalpable. The right seminal vesicle is nonpalpable. The perineum is normal on inspection.  Genitourinary: Examination of the penis demonstrates no discharge, no masses, no lesions and a normal meatus. The scrotum is without lesions. The right epididymis is palpably normal and non-tender. The left epididymis is palpably normal and non-tender. The right testis is non-tender and without masses. The left testis is non-tender and without masses.  Lymphatics: The femoral and inguinal nodes are not enlarged or tender.  Skin: Normal skin turgor, no visible rash and no visible skin lesions.  Neuro/Psych:. Mood and affect are appropriate.    Results/Data Urine [Data Includes: Last 1 Day]   01Aug2016  COLOR YELLOW   APPEARANCE CLOUDY   SPECIFIC GRAVITY 1.020   pH 7.0   GLUCOSE NEGATIVE   BILIRUBIN NEGATIVE   KETONE NEGATIVE   BLOOD 3+   PROTEIN 2+   NITRITE NEGATIVE   LEUKOCYTE ESTERASE 2+   SQUAMOUS EPITHELIAL/HPF 6-10 HPF  WBC >60 WBC/HPF  RBC 3-10 RBC/HPF  BACTERIA FEW HPF  CRYSTALS NONE SEEN HPF  CASTS See Below LPF  Yeast NONE SEEN HPF   Procedure  Procedure: Cystoscopy   Indication: Hematuria.  Informed Consent: Risks, benefits, and potential adverse events were discussed and informed consent was obtained from the patient . Specific risks including, but not limited to bleeding, infection, pain, allergic reaction etc. were explained.  Prep: The patient was prepped with betadine.  Anesthesia:. Local anesthesia was administered intraurethrally with 2% lidocaine jelly.  Antibiotic prophylaxis: Ciprofloxacin.  Procedure  Note:  Urethral meatus:. No abnormalities.  Anterior urethra: No abnormalities.  Prostatic urethra:. The lateral and median prostatic lobes were enlarged.  Bladder: Visulization was clear. The ureteral orifices were in the normal anatomic position bilaterally. A solitary tumor was visualized in the bladder. A papillary tumor was seen in the bladder measuring approximately 3 cm in size. This tumor was located on the right side, at the base of the bladder. The patient tolerated the procedure well.  Complications: None.    Assessment Assessed  1. Gross hematuria (R31.0) 2. 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 URINE CYTOLOGY; Status:Hold For - Specimen/Data Collection,Appointment;  Requested for:01Aug2016;  2. Follow-up After Test Office  Follow-up  Status: Hold For - Date of Service  Requested for:  01Aug2016 3. Follow-up Schedule Surgery Office  Follow-up  Status: Complete  Done: 01Aug2016 Health Maintenance  4. UA With REFLEX; [Do Not Release]; Status:Resulted - Requires Verification;   Done:  01Aug2016 11:12AM  He needs TUR Bladder tumor. The procedure, risks, benefits were discussed with the patient. The risks include but are not limited to hemorrhage, infection bladder injury. He understands and wishes to proceed. Will get medical clearance from Dr Bluford Kaufmann.

## 2015-02-02 NOTE — Transfer of Care (Signed)
Immediate Anesthesia Transfer of Care Note  Patient: Timothy Kim  Procedure(s) Performed: Procedure(s) (LRB): TRANSURETHRAL RESECTION OF BLADDER TUMOR (TURBT) (N/A) CYSTOSCOPY, INSTILLATION OF MITOMYCIN C (N/A)  Patient Location: PACU  Anesthesia Type: General  Level of Consciousness: awake, alert  and oriented  Airway & Oxygen Therapy: Patient Spontanous Breathing and Patient connected to face mask oxygen  Post-op Assessment: Report given to PACU RN and Post -op Vital signs reviewed and stable  Post vital signs: Reviewed and stable  Complications: No apparent anesthesia complications Filed Vitals:   02/02/15 0955  BP: 126/77  Pulse: 76  Temp: 36.9 C  Resp: 20

## 2015-02-02 NOTE — Anesthesia Preprocedure Evaluation (Addendum)
Anesthesia Evaluation  Patient identified by MRN, date of birth, ID band Patient awake  General Assessment Comment:Past Medical History Diagnosis Date . ALLERGIC RHINITIS 02/09/2008   Qualifier: Diagnosis of By: Burnice Logan MD, [place] HYPERTROPHY 03/03/2007   Qualifier: Diagnosis of By: Rogue Bussing CMA, Maryann Alar  . GERD 03/03/2007   Qualifier: Diagnosis of By: Rogue Bussing CMA, Maryann Alar  . HYPERTENSION 03/03/2007   Qualifier: Diagnosis of By: Rogue Bussing CMA, Maryann Alar  . LOW BACK PAIN 06/09/2007   Qualifier: Diagnosis of By: Burnice Logan MD, Doretha Sou  . 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 OSTEOARTHRITIS 06/09/2007   Qualifier: Diagnosis of By: Burnice Logan MD, Doretha Sou  . Glaucoma  . Radiation 09/04/14-10/13/14   right retromolar region and right neck 60 Gy . CLL (chronic lymphocytic leukemia)    Stage 1 . Tonsillar cancer    Right Tonsil, invasive squamous cell . Cancer 05/2014   tonsil . DIABETES MELLITUS, TYPE II 03/03/2007   Qualifier: Diagnosis of By: Rogue Bussing CMA, Maryann Alar        Reviewed: Allergy & Precautions, NPO status , Patient's Chart, lab work & pertinent test results  Airway Mallampati: II  TM Distance: >3 FB Neck ROM: Full   Comment: Postoperative changes with operative flap to right maxilla. He denies pain with chewing nor clenching his jaw.  Neck CT reviewed. Dental no notable dental hx.    Pulmonary neg pulmonary ROS,  breath sounds clear to auscultation  Pulmonary exam normal       Cardiovascular hypertension, Pt. on medications + Peripheral Vascular Disease Normal cardiovascular examRhythm:Regular Rate:Normal     Neuro/Psych negative neurological ROS  negative psych ROS   GI/Hepatic Neg liver ROS, GERD-  ,H/O tonsillar cancer, derangement of  TMJ   Endo/Other  diabetes  Renal/GU Renal disease  negative genitourinary   Musculoskeletal  (+) Arthritis -,   Abdominal   Peds negative pediatric ROS (+)  Hematology  (+) anemia ,   Anesthesia Other Findings   Reproductive/Obstetrics negative OB ROS                          Anesthesia Physical Anesthesia Plan  ASA: III  Anesthesia Plan: General   Post-op Pain Management:    Induction: Intravenous  Airway Management Planned: LMA  Additional Equipment:   Intra-op Plan:   Post-operative Plan: Extubation in OR  Informed Consent: I have reviewed the patients History and Physical, chart, labs and discussed the procedure including the risks, benefits and alternatives for the proposed anesthesia with the patient or authorized representative who has indicated his/her understanding and acceptance.   Dental advisory given  Plan Discussed with: CRNA  Anesthesia Plan Comments: (Discussed spinal and general. H/O severe spinal stenosis. I have reviewed his neck CT scan. Pathologic fracture right mandible. This is asymptomatic, with no pain with chewing. Plan general with LMA and intubation as backup. Plan to avoid mask ventilation as possible and to be gentle with mandible.)        Anesthesia Quick Evaluation

## 2015-02-02 NOTE — Anesthesia Postprocedure Evaluation (Signed)
  Anesthesia Post-op Note  Patient: Timothy Kim  Procedure(s) Performed: Procedure(s) (LRB): TRANSURETHRAL RESECTION OF BLADDER TUMOR (TURBT) (N/A) CYSTOSCOPY, INSTILLATION OF MITOMYCIN C (N/A)  Patient Location: PACU  Anesthesia Type: General  Level of Consciousness: awake and alert   Airway and Oxygen Therapy: Patient Spontanous Breathing  Post-op Pain: mild  Post-op Assessment: Post-op Vital signs reviewed, Patient's Cardiovascular Status Stable, Respiratory Function Stable, Patent Airway and No signs of Nausea or vomiting  Last Vitals:  Filed Vitals:   02/02/15 1315  BP: 149/71  Pulse: 67  Temp:   Resp: 15    Post-op Vital Signs: stable   Complications: No apparent anesthesia complications

## 2015-02-02 NOTE — Anesthesia Procedure Notes (Signed)
Procedure Name: LMA Insertion Date/Time: 02/02/2015 11:10 AM Performed by: Mechele Claude Pre-anesthesia Checklist: Patient identified, Emergency Drugs available, Suction available and Patient being monitored Patient Re-evaluated:Patient Re-evaluated prior to inductionOxygen Delivery Method: Circle System Utilized Preoxygenation: Pre-oxygenation with 100% oxygen Intubation Type: IV induction Ventilation: Mask ventilation without difficulty LMA: LMA inserted LMA Size: 4.0 Number of attempts: 1 Airway Equipment and Method: bite block Placement Confirmation: positive ETCO2 Tube secured with: Tape Dental Injury: Teeth and Oropharynx as per pre-operative assessment

## 2015-02-02 NOTE — Op Note (Signed)
Timothy Kim is a 81 y.o.   02/02/2015  General  Preop diagnosis: Papillary bladder tumor.  Postop diagnosis: Same  Procedure done: Cystoscopy, TUR bladder tumor  Surgeon: Charlene Brooke. [name]  Anesthesia: Gen.  Indication: Patient is a 81 years old male who was seen for gross hematuria. 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 He has been having gross painless hematuria on and off since April. CT scan showed a 2 mm nonobstructing right lower pole renal calculus and a 2.8 cm filling defect in the bladder. Cystoscopy revealed a 3 cm tumor on the right side of the bladder above the ureteral orifice. He is scheduled today for cystoscopy TUR BT  Procedure: Patient was identified by his wrist band and proper timeout was taken.  Under general anesthesia he was prepped and draped and placed in the dorsolithotomy position. A panendoscope was inserted in the bladder. The anterior urethra is normal. There is trilobar prostatic hypertrophy. The bladder is moderately trabeculated. There is a papillary tumor above the right ureteral orifice that measures about 3 cm. There are no other tumors in the bladder. There is no stone in the bladder. The ureteral orifices are in normal position and shape.  The cystoscope was removed. The urethra was dilated up to #28 [place] with Von Buren sounds then a #26 resectoscope was inserted in the bladder. Resection of the bladder tumor was done with the cold loop. Then the base of the bladder tumor was resected for staging purposes. The base of the bladder tumor and a 2 cm resection margin was fulgurated. There was no evidence of bleeding at the end of the procedure. The specimen were  placed in 2 different containers: One container for the bladder tumor and the other container for the base of the bladder tumor and sent to pathology. The bladder was then emptied. A #18 French Foley catheter was inserted in the  bladder.  40 mg of mitomycin-C in 40 mL of water were then instilled in the bladder. Mitomycin-C would be left in the bladder for about 45 minutes. Patient will be discharged home with an indwelling Foley catheter and he returned to the office next week for catheter removal. Patient tolerated the procedure well and left the OR in satisfactory condition to postanesthesia care unit.  EBL: Minimal  Needles, sponges count: Correct  CC: Eppie Gibson, M.D.

## 2015-02-02 NOTE — Discharge Instructions (Signed)
Transurethral Resection, Bladder Tumor A cancerous growth (tumor) can develop on the inside wall of the bladder. The bladder is the organ that holds urine. One way to remove the tumor is a procedure called a transurethral resection. The tumor is removed (resected) through the tube that carries urine from the bladder out of the body (urethra). No cuts (incisions) are made in the skin. Instead, the procedure is done through a thin telescope, called a resectoscope. Attached to it is a light and usually a tiny camera. The resectoscope is put into the urethra. In men, the urethra opens at the end of the penis. In women, it opens just above the vagina.  A transurethral resection is usually used to remove tumors that have not gotten too big or too deep. These are called Stage 0, Stage 1 or Stage 2 bladder cancers. LET YOUR CAREGIVER KNOW ABOUT:  On the day of the procedure, your caregivers will need to know the last time you had anything to eat or drink. This includes water, gum, and candy. In advance, make sure they know about:   Any allergies.  All medications you are taking, including:  Herbs, eyedrops, over-the-counter medications and creams.  Blood thinners (anticoagulants), aspirin or other drugs that could affect blood clotting.  Use of steroids (by mouth or as creams).  Previous problems with anesthetics, including local anesthetics.  Possibility of pregnancy, if this applies.  Any history of blood clots.  Any history of bleeding or other blood problems.  Previous surgery.  Smoking history.  Any recent symptoms of colds or infections.  Other health problems. RISKS AND COMPLICATIONS This is usually a safe procedure. Every procedure has risks, though. For a transurethral resection, they include:  Infection. Antibiotic medication would need to be taken.  Bleeding.  Light bleeding may last for several days after the procedure.  If bleeding continues or is heavy, the bladder may  need rinsing. Or, a new catheter might be put in for awhile.  Sometimes bed rest is needed.  Urination problems.  Pain and burning can occur when urinating. This usually goes away in a few days.  Scarring from the procedure can block the flow of urine.  Bladder damage.  It can be punctured or torn during removal of the tumor. If this happens, a catheter might be needed for longer. Antibiotics would be taken while the bladder heals.  Urine can leak through the hole or tear into the abdomen. If this happens, surgery may be needed to repair the bladder. BEFORE THE PROCEDURE   A medical evaluation will be done. This may include:  A physical examination.  Urine test. This is to make sure you do not have a urinary tract infection.  Blood tests.  A test that checks the heart's rhythm (electrocardiogram).  Talking with an anesthesiologist. This is the person who will be in charge of the medication (anesthesia) to keep you from feeling pain during the transurethral resection. You might be asleep during the procedure (general anesthesia) or numb from the waist down, but awake during the procedure (spinal anesthesia). Ask your surgeon what to expect.  The person who is having a transurethral resection needs to give what is called informed consent. This requires signing a legal paper that gives permission for the procedure. To give informed consent:  You must understand how the procedure is done and why.  You must be told all the risks and benefits of the procedure.  You must sign the consent. Sometimes a legal guardian  can do this.  Signing should be witnessed by a healthcare professional.  The day before the surgery, eat only a light dinner. Then, do not eat or drink anything for at least 8 hours before the surgery. Ask your caregiver if it is OK to take any needed medicines with a sip of water.  Arrive at least an hour before the surgery or whenever your surgeon recommends. This will  give you time to check in and fill out any needed paperwork. PROCEDURE  The preparation:  You will change into a hospital gown.  A needle will be inserted in your arm. This is an intravenous access tube (IV). Medication will be able to flow directly into your body through this needle.  Small monitors will be put on your body. They are used to check your heart, blood pressure, and oxygen level.  You might be given medication that will help you relax (sedative).  You will be given a general anesthetic or spinal anesthesia.  The procedure:  Once you are asleep or numb from the waist down, your legs will be placed in stirrups.  The resectoscope will be passed through the urethra into the bladder.  Fluid will be passed through the resectoscope. This will fill the bladder with water.  The surgeon will examine the bladder through the scope. If the scope has a camera, it can take pictures from inside the bladder. They can be projected onto a TV screen.  The surgeon will use various tools to remove the tumor in small pieces. Sometimes a laser (a beam of light energy) is used. Other tools may use electric current.  A tube (catheter) will often be placed so that urine can drain into a bag outside the body. This process helps stop bleeding. This tube keeps blood clots from blocking the urethra.  The procedure usually takes 30 to 45 minutes. AFTER THE PROCEDURE   You will stay in a recovery area until the anesthesia has worn off. Your blood pressure and pulse will be checked every so often. Then you will be taken to a hospital room.  You may continue to get fluids through the IV for awhile.  Some pain is normal. The catheter might be uncomfortable. Pain is usually not severe. If it is, ask for pain medicine.  Your urine may look bloody after a transurethral resection. This is normal.  If bleeding is heavy, a hospital caregiver may rinse out the bladder (irrigation) through the  catheter.  Once the urine is clear, the catheter will be taken out.  You will need to stay in the hospital until you can urinate on your own.  Post Anesthesia Home Care Instructions  Activity: Get plenty of rest for the remainder of the day. A responsible adult should stay with you for 24 hours following the procedure.  For the next 24 hours, DO NOT: -Drive a car -Paediatric nurse -Drink alcoholic beverages -Take any medication unless instructed by your physician -Make any legal decisions or sign important papers.  Meals: Start with liquid foods such as gelatin or soup. Progress to regular foods as tolerated. Avoid greasy, spicy, heavy foods. If nausea and/or vomiting occur, drink only clear liquids until the nausea and/or vomiting subsides. Call your physician if vomiting continues.  Special Instructions/Symptoms: Your throat may feel dry or sore from the anesthesia or the breathing tube placed in your throat during surgery. If this causes discomfort, gargle with warm salt water. The discomfort should disappear within 24 hours.  If you  had a scopolamine patch placed behind your ear for the management of post- operative nausea and/or vomiting:  1. The medication in the patch is effective for 72 hours, after which it should be removed.  Wrap patch in a tissue and discard in the trash. Wash hands thoroughly with soap and water. 2. You may remove the patch earlier than 72 hours if you experience unpleasant side effects which may include dry mouth, dizziness or visual disturbances. 3. Avoid touching the patch. Wash your hands with soap and water after contact with the patch.    PROGNOSIS   Transurethral resection is considered the best way to treat bladder tumors that are not too far along. For most people, the treatment is successful. Sometimes, though, more treatment is needed.  Bladder cancers can come back even after a successful procedure. Because of this, be sure to have a  checkup with your caregiver every 3 to 6 months. If everything is OK for 3 years, you can reduce the checkups to once a year. Document Released: 04/05/2009 Document Revised: 09/01/2011 Document Reviewed: 07/19/2013 [facility] Patient Information 2015 [place], [place]. This information is not intended to replace advice given to you by your health care provider. Make sure you discuss any questions you have with your health care provider. Indwelling Urinary Catheter Care You have been given a flexible tube (catheter) used to drain the bladder. Catheters are often used when a person has difficulty urinating due to blockage, bleeding, infection, or inability to control bladder or bowel movements (incontinence). A catheter requires daily care to prevent infection and blockage. HOME CARE INSTRUCTIONS  Do the following to reduce the risk of infection. Antibiotic medicines cannot prevent infections. Limit the number of bacteria entering your bladder  Wash your hands for 2 minutes with soapy water before and after handling the catheter.  Wash your bottom and the entire catheter twice daily, as well as after each bowel movement. Wash the tip of the penis or just above the vaginal opening with soap and warm water, rinse, and then wash the rectal area. Always wash from front to back.  When changing from the leg bag to overnight bag or from the overnight bag to leg bag, thoroughly clean the end of the catheter where it connects to the tubing with an alcohol wipe.  Clean the leg bag and overnight bag daily after use. Replace your drainage bags weekly.  Always keep the tubing and bag below the level of your bladder. This allows your urine to drain properly. Lifting the bag or tubing above the level of your bladder will cause dirty urine to flow back into your bladder. If you must briefly lift the bag higher than your bladder, pinch the catheter or tubing to prevent backflow.  Drink enough water and fluids to keep  your urine clear or pale yellow, or as directed by your caregiver. This will flush bacteria out of the bladder. Protect tissues from injury  Attach the catheter to your leg so there is no tension on the catheter. Use adhesive tape or a leg strap. If you are using adhesive tape, remove any sticky residue left behind by the previous tape you used.  Place your leg bag on your lower leg. Fasten the straps securely and comfortably.  Do not remove the catheter yourself unless you have been instructed how to do so. Keep the urinary pathway open  Check throughout the day to be sure your catheter is working and urine is draining freely. Make sure the  tubing does not become kinked.  Do not let the drainage bag overfill. SEEK IMMEDIATE MEDICAL CARE IF:   The catheter becomes blocked. Urine is not draining.  Urine is leaking.  You have any pain.  You have a fever. Document Released: 06/09/2005 Document Revised: 05/26/2012 Document Reviewed: 11/08/2009 [facility] Patient Information 2015 [place], [place]. This information is not intended to replace advice given to you by your health care provider. Make sure you discuss any questions you have with your health care provider.

## 2015-02-05 ENCOUNTER — Encounter (HOSPITAL_BASED_OUTPATIENT_CLINIC_OR_DEPARTMENT_OTHER): Payer: Self-pay | Admitting: Urology

## 2015-02-05 DIAGNOSIS — D494 Neoplasm of unspecified behavior of bladder: Secondary | ICD-10-CM | POA: Diagnosis not present

## 2015-02-08 ENCOUNTER — Ambulatory Visit (INDEPENDENT_AMBULATORY_CARE_PROVIDER_SITE_OTHER): Payer: Medicare Other | Admitting: Internal Medicine

## 2015-02-08 ENCOUNTER — Encounter: Payer: Self-pay | Admitting: Internal Medicine

## 2015-02-08 VITALS — BP 138/90 | HR 77 | Temp 98.0°F | Resp 20 | Ht 66.0 in | Wt 142.0 lb

## 2015-02-08 DIAGNOSIS — I10 Essential (primary) hypertension: Secondary | ICD-10-CM | POA: Diagnosis not present

## 2015-02-08 DIAGNOSIS — C679 Malignant neoplasm of bladder, unspecified: Secondary | ICD-10-CM | POA: Diagnosis not present

## 2015-02-08 DIAGNOSIS — Z8551 Personal history of malignant neoplasm of bladder: Secondary | ICD-10-CM | POA: Insufficient documentation

## 2015-02-08 DIAGNOSIS — E119 Type 2 diabetes mellitus without complications: Secondary | ICD-10-CM

## 2015-02-08 NOTE — Patient Instructions (Signed)
Limit your sodium (Salt) intake  Return in 3 months for follow-up   

## 2015-02-08 NOTE — Progress Notes (Signed)
Subjective:    Patient ID: Timothy Kim, male    DOB: 16-Sep-1929, 81 y.o.   MRN: [id number]  HPI 81 year old patient who has essential hypertension and diabetes.  He is seen today for follow-up of lower extremity edema.  This has improved on furosemide. Since his last visit here, he is status post TURBT which revealed carcinoma.  He is scheduled for urological follow-up later this week Generally doing well.  Still with poor appetite but seems to be improving  Wt Readings from Last 3 Encounters:  02/08/15 142 lb (64.411 kg)  02/02/15 142 lb 8 oz (64.638 kg)  01/11/15 148 lb (67.132 kg)   Past Medical History  Diagnosis Date  . Glaucoma     both eyes  . Allergic rhinitis   . BPH (benign prostatic hypertrophy)   . Organic impotence   . History of radiation to head and neck region 09-04-2014 to  10-13-2014    right retromolar region and right neck  60 Gy  . Venous insufficiency   . Anemia in neoplastic disease     chronic  . Pulmonary nodule     benign  and stable per last CT  . Hypertension   . OA (osteoarthritis)   . CLL (chronic lymphocytic leukemia) oncologist-  dr Heath Lark ([facility])    dx Jan 2014 --- Stage 1--  currently asymptomatic as of Apr 2016 and No active disease  . 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 Squamous cell carcinoma of retromolar trigone     s/p  radical neck dissection and radiation therapy  . Dysphasia functional--  speech therapy    post op  radical neck dissection 07-21-2014--  changed diet to no chewy food , softer foods , states with the changes swallowed okay without any issues  . Gross hematuria   . Bladder tumor   . DDD (degenerative disc disease), cervical   . 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  Radiation-induced dermatitis   . Bilateral lower extremity edema     feet  . Macular degeneration     right eye only  . History of tracheostomy     post op radical neck dissection  07-21-2014--  post op tracheostomy on 07-25-2014  decannulated and sutured 07-29-2014  . Alcohol abuse   . Borderline diabetes   . History of gastroesophageal reflux (GERD)   . DDD (degenerative disc disease), lumbosacral   . Nephrolithiasis     right side--  per CT from [facility] 01-17-2015 non-obstructing    Social History   Social History  . Marital Status: Single    Spouse Name: N/A  . Number of Children: 2  . Years of Education: N/A   Occupational History  . Not on file.   Social History Main Topics  . Smoking status: Never Smoker   . Smokeless tobacco: Never Used  . Alcohol Use: 16.8 oz/week    28 Shots of liquor per week     Comment: Gin and vodka 3-4 daily   (Hx alcohol withdrawal )  . Drug Use: No  . Sexual Activity: Not on file   Other Topics Concern  . Not on file   Social History Narrative   Patient is divorced with 2 children.   Patient  has never smoked. Patient has never used smokeless tobacco.    Patient drinks gin or vodka on a daily basis.    Past Surgical History  Procedure Laterality Date  . Rotator cuff repair Right 12-11-2000  . Tonsil biopsy Right 05/24/14  . Maxillectomy Right 07/21/14,   [facility]    Radical Neck Dissection, Maxillectomy, Right Marginal Mandibulectomy, dental extractions, Parascauplar fasciocutaeous Free flap reconstruction  . Laminectomy with posterior lateral arthrodesis level 1  09-01-2003    L3 -5 LAMINECTOMY W/ DECOMPRESSION AND FUSION L4-5  . Cataract extraction w/ intraocular lens  implant, bilateral    . Retinal detachment repair w/ scleral buckle le  11-03-2000  . Inguinal hernia repair Bilateral right 1988//  left 1970's  . Transurethral resection of bladder tumor N/A 02/02/2015    Procedure: TRANSURETHRAL RESECTION OF BLADDER TUMOR  (TURBT);  Surgeon: Lowella Bandy, MD;  Location: [facility];  Service: Urology;  Laterality: N/A;  . Cystoscopy N/A 02/02/2015    Procedure: CYSTOSCOPY, INSTILLATION OF MITOMYCIN C;  Surgeon: Lowella Bandy, MD;  Location: [facility];  Service: Urology;  Laterality: N/A;    Family History  Problem Relation Age of Onset  . Cancer Mother     lung ca  . Alcohol abuse Sister   . Hypertension Neg Hx     family hx  . Sudden death Neg Hx     famiylhx    No Known Allergies  Current Outpatient Prescriptions on File Prior to Visit  Medication Sig Dispense Refill  . aspirin 325 MG tablet Take 650 mg by mouth daily.     . brimonidine-timolol (COMBIGAN) 0.2-0.5 % ophthalmic solution Apply 1 drop to eye 2 (two) times daily.     Marland Kitchen DHEA 50 MG TABS Take 1 tablet by mouth daily.    . furosemide (LASIX) 20 MG tablet Take 1 tablet (20 mg total) by mouth daily. (Patient taking differently: Take 20 mg by mouth every morning. ) 30 tablet 3  . Krill Oil 300 MG CAPS Take 300 mg by mouth daily.    Marland Kitchen latanoprost (XALATAN) 0.005 % ophthalmic solution Place 1 drop into both eyes every evening.   4  . Lutein-Zeaxanthin 15-0.7 MG CAPS Take 1 tablet by mouth daily.    . metoprolol succinate (TOPROL-XL) 100 MG 24 hr tablet TAKE HALF TABLET IMMEDIATELY FOLLOWING A MEAL.  Currently taking 50mg  tab per his PC (Patient taking differently: Take 50 mg by mouth every morning. TAKE HALF TABLET IMMEDIATELY FOLLOWING A MEAL.  Currently taking 50mg  tab per his PC) 90 tablet 3  . sildenafil (VIAGRA) 100 MG tablet Take 100 mg by mouth daily as needed for erectile dysfunction.    . sodium chloride (OCEAN) 0.65 % nasal spray Place 2 sprays into the nose as needed.     . sodium fluoride (PREVIDENT 5000 PLUS) 1.1 % CREA dental cream Apply thin ribbon of cream to tooth brush. Brush teeth for 2 minutes. Spit out excess. Repeat nightly. (Patient taking differently: Place 1 application onto teeth at bedtime. Apply  thin ribbon of cream to tooth brush. Brush teeth for 2 minutes. Spit out excess. Repeat nightly.) 1 Tube prn  . tadalafil (CIALIS) 20 MG tablet Take 20 mg by mouth daily as needed for erectile dysfunction.    . triamcinolone cream (KENALOG) 0.1 % APPLY TO AFFECTED AREA TWICE DAILY (Patient taking differently: APPLY TO AFFECTED AREA TWICE DAILY--  prn) 80 g 1  . zolpidem (AMBIEN) 5 MG tablet TAKE 1  TABLET AT BEDTIME AS NEEDED FOR SLEEP 30 tablet 2  . benazepril-hydrochlorthiazide (LOTENSIN HCT) 20-12.5 MG per tablet TAKE 1 TABLET EVERY DAY (Patient not taking: Reported on 02/08/2015) 90 tablet 3  . HYDROcodone-acetaminophen (NORCO) 5-325 MG per tablet Take 1 tablet by mouth every 6 (six) hours as needed for moderate pain. (Patient not taking: Reported on 02/08/2015) 20 tablet 0   No current facility-administered medications on file prior to visit.    BP 138/90 mmHg  Pulse 77  Temp(Src) 98 F (36.7 C) (Oral)  Resp 20  Ht 5\' 6"  (1.676 m)  Wt 142 lb (64.411 kg)  BMI 22.93 kg/m2  SpO2 97%      Review of Systems  Constitutional: Positive for activity change, appetite change and fatigue. Negative for fever and chills.  HENT: Negative for congestion, dental problem, ear pain, hearing loss, sore throat, tinnitus, trouble swallowing and voice change.   Eyes: Negative for pain, discharge and visual disturbance.  Respiratory: Negative for cough, chest tightness, wheezing and stridor.   Cardiovascular: Negative for chest pain, palpitations and leg swelling.  Gastrointestinal: Negative for nausea, vomiting, abdominal pain, diarrhea, constipation, blood in stool and abdominal distention.  Genitourinary: Negative for urgency, hematuria, flank pain, discharge, difficulty urinating and genital sores.  Musculoskeletal: Negative for myalgias, back pain, joint swelling, arthralgias, gait problem and neck stiffness.  Skin: Negative for rash.  Neurological: Positive for weakness. Negative for dizziness,  syncope, speech difficulty, numbness and headaches.  Hematological: Negative for adenopathy. Does not bruise/bleed easily.  Psychiatric/Behavioral: Negative for behavioral problems and dysphoric mood. The patient is not nervous/anxious.        Objective:   Physical Exam  Constitutional: He appears well-developed and well-nourished. No distress.  Cardiovascular: Normal rate and regular rhythm.   Pulmonary/Chest: Effort normal.  Musculoskeletal:  Plus 1 edema only          Assessment & Plan:     Lower extremity edema.  Will continue restricted salt diet and furosemide as needed. Status post TURBT .  Follow-up urology Essential hypertension, stable Diabetes mellitus stable  Recheck 3-4 months

## 2015-02-08 NOTE — Progress Notes (Signed)
Pre visit review using our clinic review tool, if applicable. No additional management support is needed unless otherwise documented below in the visit note. 

## 2015-02-09 DIAGNOSIS — C672 Malignant neoplasm of lateral wall of bladder: Secondary | ICD-10-CM | POA: Diagnosis not present

## 2015-02-14 DIAGNOSIS — Z4889 Encounter for other specified surgical aftercare: Secondary | ICD-10-CM | POA: Diagnosis not present

## 2015-02-14 DIAGNOSIS — C148 Malignant neoplasm of overlapping sites of lip, oral cavity and pharynx: Secondary | ICD-10-CM | POA: Diagnosis not present

## 2015-02-14 DIAGNOSIS — H9190 Unspecified hearing loss, unspecified ear: Secondary | ICD-10-CM | POA: Diagnosis not present

## 2015-02-14 DIAGNOSIS — R200 Anesthesia of skin: Secondary | ICD-10-CM | POA: Diagnosis not present

## 2015-02-16 ENCOUNTER — Other Ambulatory Visit: Payer: Self-pay | Admitting: Urology

## 2015-02-17 ENCOUNTER — Other Ambulatory Visit: Payer: Self-pay | Admitting: Internal Medicine

## 2015-03-16 ENCOUNTER — Ambulatory Visit
Admission: RE | Admit: 2015-03-16 | Discharge: 2015-03-16 | Disposition: A | Discharge status: Home / Self Care | Payer: Medicare Other | Source: Ambulatory Visit | Attending: Radiation Oncology | Admitting: Radiation Oncology

## 2015-03-16 ENCOUNTER — Encounter: Payer: Self-pay | Admitting: Radiation Oncology

## 2015-03-16 VITALS — BP 127/68 | HR 64 | Temp 97.7°F | Ht 66.0 in | Wt 145.0 lb

## 2015-03-16 DIAGNOSIS — C062 Malignant neoplasm of retromolar area: Secondary | ICD-10-CM | POA: Diagnosis not present

## 2015-03-16 NOTE — Progress Notes (Signed)
Radiation Oncology         [phone] ________________________________  Name: Timothy Kim MRN: [id number]  Date: 03/16/2015  DOB: 17-Jan-1930  Follow-Up Visit Note  CC: [name], MD  Philomena Doheny, MD  Diagnosis and Prior Radiotherapy:       ICD-9-CM ICD-10-CM   1. Squamous cell carcinoma of retromolar trigone 145.6 C06.2    STAGE IVB pT4b pN0 Grade 2 Squamous cell carcinoma, Right Retromolar Trigone; +PNI, no LVSI  Indication for treatment: post-op, curative    Radiation treatment dates:   09/04/2014-10/13/2014 Site/dose:  Right retromolar region (tumor bed) and right neck /  60 Gy in 30 fractions to tumor bed and 54 Gy in 30 fractions to neck  Narrative:  The patient returns today for routine follow-up appointment with radiation oncology. He denies symptoms of pain and is not currently using a feeding tube. In regards to swallowing issues, he reports being able to eat a variety of foods with only having to chew longer. The patient reports not using fluoride trays daily, but instead using fluoride dental cream when brushing teeth. He last visit with Dr. Macky Lower took place on 02/08/2015. The patient expressed concern over swelling of his right jaw. He has seen Dr. Janice Norrie, MD for his bladder symptoms. Ultimately, the patient underwent TURBT on 02/02/2015. The path revealed high grade TCC. The patient was recommended a second look procedure with TUR biopsy at the resected area to rule out muscle invasion. If the resection is clear, this will likely be followed by intravesical BCG. He stated, "I don't have the strength and energy." Also, that he is otherwise well, besides the swelling of his jaw, which occurred 7-10 days ago. "I'm not sure if it's really swelling. It just feels like it is." No pain or new numbness along the face is reported. Numbness noted in the preauricular area, that is stable since surgery. Decreased hearing on the right side has been present since "I  left the hospital in February." The patient is planning on pursuing a hearing test to address this symptom per recommendation by Dr Vicie Mutters; he sees him again in Nov.    ALLERGIES:  has No Known Allergies.  Meds: Current Outpatient Prescriptions  Medication Sig Dispense Refill  . brimonidine-timolol (COMBIGAN) 0.2-0.5 % ophthalmic solution Apply 1 drop to eye 2 (two) times daily.     Marland Kitchen DHEA 50 MG TABS Take 1 tablet by mouth daily.    . furosemide (LASIX) 20 MG tablet Take 1 tablet (20 mg total) by mouth daily. (Patient taking differently: Take 20 mg by mouth every morning. ) 30 tablet 3  . Krill Oil 300 MG CAPS Take 300 mg by mouth daily.    Marland Kitchen latanoprost (XALATAN) 0.005 % ophthalmic solution Place 1 drop into both eyes every evening.   4  . Lutein-Zeaxanthin 15-0.7 MG CAPS Take 1 tablet by mouth daily.    . metoprolol succinate (TOPROL-XL) 100 MG 24 hr tablet TAKE HALF TABLET IMMEDIATELY FOLLOWING A MEAL.  Currently taking 50mg  tab per his PC (Patient taking differently: Take 50 mg by mouth every morning. TAKE HALF TABLET IMMEDIATELY FOLLOWING A MEAL.  Currently taking 50mg  tab per his PC) 90 tablet 3  . naproxen sodium (ANAPROX) 220 MG tablet Take 220 mg by mouth 1 day or 1 dose.    . sodium fluoride (PREVIDENT 5000 PLUS) 1.1 % CREA dental cream Apply thin ribbon of cream to tooth brush. Brush teeth for 2 minutes. Spit out  excess. Repeat nightly. (Patient taking differently: Place 1 application onto teeth at bedtime. Apply thin ribbon of cream to tooth brush. Brush teeth for 2 minutes. Spit out excess. Repeat nightly.) 1 Tube prn  . tadalafil (CIALIS) 20 MG tablet Take 20 mg by mouth daily as needed for erectile dysfunction.    . triamcinolone cream (KENALOG) 0.1 % APPLY TO AFFECTED AREA TWICE DAILY 80 g 2  . zolpidem (AMBIEN) 5 MG tablet TAKE 1 TABLET AT BEDTIME AS NEEDED FOR SLEEP 30 tablet 2  . sildenafil (VIAGRA) 100 MG tablet Take 100 mg by mouth daily as needed for erectile dysfunction.      . sodium chloride (OCEAN) 0.65 % nasal spray Place 2 sprays into the nose as needed.      No current facility-administered medications for this encounter.    Physical Findings: The patient is in no acute distress. Patient is alert and oriented. There is no significant changes to the status of the paients overall health to be noted at this time. Wt Readings from Last 3 Encounters:  03/16/15 145 lb (65.772 kg)  02/08/15 142 lb (64.411 kg)  02/02/15 142 lb 8 oz (64.638 kg)    height is 5\' 6"  (1.676 m) and weight is 145 lb (65.772 kg). His temperature is 97.7 F (36.5 C). His blood pressure is 127/68 and his pulse is 64. His oxygen saturation is 99%.   No palpable masses in the mouth, nor cervical or supraclavicular regions Mucous membrane moist Tissue graft in the right oral cavity appears healthy No evidence of tumor recurrence in the mouth  Lab Findings: Lab Results  Component Value Date   WBC 7.1 01/05/2015   HGB 14.3 02/02/2015   HCT 42.0 02/02/2015   MCV 100.8* 01/05/2015   PLT 167 01/05/2015   CMP     Component Value Date/Time   NA 134* 02/02/2015 1031   NA 140 01/05/2015 1255   K 3.9 02/02/2015 1031   K 4.3 01/05/2015 1255   CL 103 01/07/2012 0936   CO2 32* 01/05/2015 1255   CO2 29 01/07/2012 0936   GLUCOSE 114* 02/02/2015 1031   GLUCOSE 98 01/05/2015 1255   BUN 18.7 01/05/2015 1255   BUN 16 01/07/2012 0936   CREATININE 1.0 01/05/2015 1255   CREATININE 1.0 01/07/2012 0936   CALCIUM 9.1 01/05/2015 1255   CALCIUM 9.3 01/07/2012 0936   PROT 6.1* 01/05/2015 1255   PROT 7.2 01/07/2012 0936   ALBUMIN 3.0* 01/05/2015 1255   ALBUMIN 4.0 01/07/2012 0936   AST 24 01/05/2015 1255   AST 47* 01/07/2012 0936   ALT 11 01/05/2015 1255   ALT 28 01/07/2012 0936   ALKPHOS 84 01/05/2015 1255   ALKPHOS 59 01/07/2012 0936   BILITOT 0.66 01/05/2015 1255   BILITOT 1.0 01/07/2012 0936   GFRNONAA 81.57 12/12/2009 0000   GFRAA 121 08/09/2008 1103     Lab Results  Component  Value Date   TSH 1.795 01/05/2015    Radiographic Findings: No results found.  Impression/Plan:  Timothy Kim is a 81 year old male presenting to clinic in regards to his Stage IVB pT4b pN0 Grade 2 Squamous cell carcinoma, right tetromoar trigone. He is recovering from the effects of radiation therapy treatments. He is recovering well from his most recent surgery completed by Dr. Kellie Simmering, MD to address his concerning bladder symptoms with another re-look/TUR procedure planned in the near future followed by possible BCG. Healthy methods of management in regards to vocalized symptoms were reviewed. He  is encouraged to continue the use of Biotene products to allevate reported dry mouth and encouraged to carry a water bottle with him during his daily activities. The patient and his wife understand the results of his most recent scans, forwarded via Dr. Vicie Mutters, MD. He is aware of his next appointment with Dr. Vicie Mutters, MD to take place in November of 2016. He is also aware of his appointment with Dr. Janice Norrie, MD to take place on October 4th, 2016. Mild lympodema was noted via physical assessment and physical therapy for massage therapy was recommended PRN to address this symptom if it becomes bothersome. He subjectively reports oral swelling that is not painful; exam is reassuring today; however, if this worsens, he has been advised to pursue Dr. Vicie Mutters, MD to proceed with follow-up sooner than November. Otherwise, he has a lot of issues to take care of in Oct, particular in terms of his bladder cancer.   1) Head and Neck Cancer Status: no evidence of recurrence via physical exam  2) Nutritional Status: Patient states his appetite levels are stable. - weight: weight stable  - PEG tube: none  3) Risk Factors: The patient has been educated about risk factors including alcohol and tobacco abuse; they understand that avoidance of alcohol and tobacco is important to prevent recurrences as well as other  cancers  4) Swallowing: He is capable of swallowing. Food is not getting stuck. However, he is having to chew for longer periods of time.  5) Dental: The patient has been encouraged to continue regular followup with dentistry, and dental hygiene including fluoride rinses. Dr. Enrique Sack is reported as his current dentist.  6) Thyroid function: The patient understands the importance of monitoring his tyroid levels and function in regards to the management of his disease.  Lab Results  Component Value Date   TSH 1.795 01/05/2015    7) Social: No active social issues to address at this time.  8) Other: The patient's pharmacy is [facility] on [address].  9) Schedule: The patient is advised of his follow-up appointment with radiation oncology to take place in February of 2017. The patient understands that he can access his appointments and medical records via [place]. All vocalized questions and concerns have been addressed. If the patient develops any further questions or concerns in regards to his treatment and recovery, he has been encouraged to contact Dr. Isidore Moos, MD.   This document serves as a record of services personally performed by Eppie Gibson, MD. It was created on her behalf by Lenn Cal, a trained medical scribe. The creation of this record is based on the scribe's personal observations and the provider's statements to them. This document has been checked and approved by the attending provider.  _____________________________   Eppie Gibson, MDuri

## 2015-03-16 NOTE — Progress Notes (Signed)
BP 127/68 mmHg  Pulse 64  Temp(Src) 97.7 F (36.5 C)  Ht 5\' 6"  (1.676 m)  Wt 145 lb (65.772 kg)  BMI 23.41 kg/m2  SpO2 99%  Pain issues, if any: None Using a feeding tube?: No Weight changes, if any:  Wt Readings from Last 3 Encounters:  03/16/15 145 lb (65.772 kg)  02/08/15 142 lb (64.411 kg)  02/02/15 142 lb 8 oz (64.638 kg)   Swallowing issues, if any: Eating all textures of food, but has to chew longer. Smoking or chewing tobacco? No Using fluoride trays daily? No.  Using Flouride Dental cream when brushing teeth Last ENT visit was on: Macky Lower - 02/08/2015 Other notable issues, if any: He reports swelling of his right jaw at this time.

## 2015-03-19 ENCOUNTER — Encounter (HOSPITAL_BASED_OUTPATIENT_CLINIC_OR_DEPARTMENT_OTHER): Payer: Self-pay | Admitting: *Deleted

## 2015-03-19 NOTE — Progress Notes (Signed)
NPO AFTER MN.  ARRIVE AT 0745.  NEEDS ISTAT.  CURRENT EKG AND CHEST CT IN CHART AND EPIC.  WILL TAKE TOPROL AM DOS W/ SIPS OF WATER.  PT HERE 02-02-2015, DR DENNENY MDA OK'D TO PROCEED THEN.  PT STATES HAD GENERAL ANES. WITH NO ISSUES.

## 2015-03-27 ENCOUNTER — Ambulatory Visit (HOSPITAL_BASED_OUTPATIENT_CLINIC_OR_DEPARTMENT_OTHER): Payer: Medicare Other | Admitting: Certified Registered"

## 2015-03-27 ENCOUNTER — Ambulatory Visit (HOSPITAL_BASED_OUTPATIENT_CLINIC_OR_DEPARTMENT_OTHER)
Admission: RE | Admit: 2015-03-27 | Discharge: 2015-03-27 | Disposition: A | Discharge status: Home / Self Care | Payer: Medicare Other | Source: Ambulatory Visit | Attending: Urology | Admitting: Urology

## 2015-03-27 ENCOUNTER — Encounter (HOSPITAL_BASED_OUTPATIENT_CLINIC_OR_DEPARTMENT_OTHER)
Admission: RE | Disposition: A | Discharge status: Home / Self Care | Payer: Self-pay | Source: Ambulatory Visit | Attending: Urology

## 2015-03-27 ENCOUNTER — Encounter (HOSPITAL_BASED_OUTPATIENT_CLINIC_OR_DEPARTMENT_OTHER): Payer: Self-pay | Admitting: *Deleted

## 2015-03-27 DIAGNOSIS — N400 Enlarged prostate without lower urinary tract symptoms: Secondary | ICD-10-CM | POA: Insufficient documentation

## 2015-03-27 DIAGNOSIS — M5030 Other cervical disc degeneration, unspecified cervical region: Secondary | ICD-10-CM | POA: Insufficient documentation

## 2015-03-27 DIAGNOSIS — Z856 Personal history of leukemia: Secondary | ICD-10-CM | POA: Insufficient documentation

## 2015-03-27 DIAGNOSIS — M5137 Other intervertebral disc degeneration, lumbosacral region: Secondary | ICD-10-CM | POA: Insufficient documentation

## 2015-03-27 DIAGNOSIS — Z79899 Other long term (current) drug therapy: Secondary | ICD-10-CM | POA: Diagnosis not present

## 2015-03-27 DIAGNOSIS — Z87442 Personal history of urinary calculi: Secondary | ICD-10-CM | POA: Diagnosis not present

## 2015-03-27 DIAGNOSIS — E119 Type 2 diabetes mellitus without complications: Secondary | ICD-10-CM | POA: Insufficient documentation

## 2015-03-27 DIAGNOSIS — K219 Gastro-esophageal reflux disease without esophagitis: Secondary | ICD-10-CM | POA: Insufficient documentation

## 2015-03-27 DIAGNOSIS — N3289 Other specified disorders of bladder: Secondary | ICD-10-CM | POA: Diagnosis not present

## 2015-03-27 DIAGNOSIS — M1990 Unspecified osteoarthritis, unspecified site: Secondary | ICD-10-CM | POA: Diagnosis not present

## 2015-03-27 DIAGNOSIS — Z923 Personal history of irradiation: Secondary | ICD-10-CM | POA: Diagnosis not present

## 2015-03-27 DIAGNOSIS — C674 Malignant neoplasm of posterior wall of bladder: Secondary | ICD-10-CM | POA: Diagnosis not present

## 2015-03-27 DIAGNOSIS — I739 Peripheral vascular disease, unspecified: Secondary | ICD-10-CM | POA: Diagnosis not present

## 2015-03-27 DIAGNOSIS — Z85818 Personal history of malignant neoplasm of other sites of lip, oral cavity, and pharynx: Secondary | ICD-10-CM | POA: Insufficient documentation

## 2015-03-27 DIAGNOSIS — I10 Essential (primary) hypertension: Secondary | ICD-10-CM | POA: Insufficient documentation

## 2015-03-27 DIAGNOSIS — N529 Male erectile dysfunction, unspecified: Secondary | ICD-10-CM | POA: Insufficient documentation

## 2015-03-27 DIAGNOSIS — C679 Malignant neoplasm of bladder, unspecified: Secondary | ICD-10-CM | POA: Diagnosis not present

## 2015-03-27 DIAGNOSIS — C672 Malignant neoplasm of lateral wall of bladder: Secondary | ICD-10-CM | POA: Diagnosis not present

## 2015-03-27 HISTORY — DX: Personal history of irradiation: Z92.3

## 2015-03-27 HISTORY — DX: Malignant neoplasm of bladder, unspecified: C67.9

## 2015-03-27 HISTORY — PX: CYSTOSCOPY WITH BIOPSY: SHX5122

## 2015-03-27 LAB — POCT I-STAT 4, (NA,K, GLUC, HGB,HCT)
Glucose, Bld: 96 mg/dL (ref 65–99)
HEMATOCRIT: 42 % (ref 39.0–52.0)
HEMOGLOBIN: 14.3 g/dL (ref 13.0–17.0)
POTASSIUM: 3.7 mmol/L (ref 3.5–5.1)
SODIUM: 136 mmol/L (ref 135–145)

## 2015-03-27 SURGERY — CYSTOSCOPY, WITH BIOPSY
Anesthesia: General | Site: Bladder

## 2015-03-27 MED ORDER — EPHEDRINE SULFATE 50 MG/ML IJ SOLN
INTRAMUSCULAR | Status: DC | PRN
  Administered 2015-03-27: 10 mg via INTRAVENOUS

## 2015-03-27 MED ORDER — FENTANYL CITRATE (PF) 100 MCG/2ML IJ SOLN
INTRAMUSCULAR | Status: DC | PRN
  Administered 2015-03-27: 50 ug via INTRAVENOUS
  Administered 2015-03-27 (×2): 25 ug via INTRAVENOUS

## 2015-03-27 MED ORDER — STERILE WATER FOR IRRIGATION IR SOLN
Status: DC | PRN
  Administered 2015-03-27: 3000 mL

## 2015-03-27 MED ORDER — ONDANSETRON HCL 4 MG/2ML IJ SOLN
INTRAMUSCULAR | Status: DC | PRN
  Administered 2015-03-27: 4 mg via INTRAVENOUS

## 2015-03-27 MED ORDER — LIDOCAINE HCL (CARDIAC) 20 MG/ML IV SOLN
INTRAVENOUS | Status: DC | PRN
  Administered 2015-03-27: 80 mg via INTRAVENOUS

## 2015-03-27 MED ORDER — BELLADONNA ALKALOIDS-OPIUM 16.2-60 MG RE SUPP
RECTAL | Status: DC | PRN
  Administered 2015-03-27: 1 via RECTAL

## 2015-03-27 MED ORDER — PROPOFOL 10 MG/ML IV BOLUS
INTRAVENOUS | Status: DC | PRN
  Administered 2015-03-27: 170 mg via INTRAVENOUS

## 2015-03-27 MED ORDER — FENTANYL CITRATE (PF) 100 MCG/2ML IJ SOLN
25.0000 ug | INTRAMUSCULAR | Status: DC | PRN
  Filled 2015-03-27: qty 1

## 2015-03-27 MED ORDER — CEFAZOLIN SODIUM-DEXTROSE 2-3 GM-% IV SOLR
INTRAVENOUS | Status: AC
  Filled 2015-03-27: qty 50

## 2015-03-27 MED ORDER — LACTATED RINGERS IV SOLN
INTRAVENOUS | Status: DC
  Administered 2015-03-27 (×2): via INTRAVENOUS
  Filled 2015-03-27: qty 1000

## 2015-03-27 MED ORDER — LIDOCAINE HCL 2 % EX GEL
CUTANEOUS | Status: DC | PRN
  Administered 2015-03-27: 1 via URETHRAL

## 2015-03-27 MED ORDER — DEXAMETHASONE SODIUM PHOSPHATE 4 MG/ML IJ SOLN
INTRAMUSCULAR | Status: DC | PRN
  Administered 2015-03-27: 8 mg via INTRAVENOUS

## 2015-03-27 MED ORDER — BELLADONNA ALKALOIDS-OPIUM 16.2-60 MG RE SUPP
RECTAL | Status: AC
Start: 2015-03-27 — End: 2015-03-27
  Filled 2015-03-27: qty 1

## 2015-03-27 MED ORDER — FENTANYL CITRATE (PF) 100 MCG/2ML IJ SOLN
INTRAMUSCULAR | Status: AC
  Filled 2015-03-27: qty 4

## 2015-03-27 MED ORDER — CEFAZOLIN SODIUM 1-5 GM-% IV SOLN
1.0000 g | INTRAVENOUS | Status: DC
  Filled 2015-03-27: qty 50

## 2015-03-27 MED ORDER — CEFAZOLIN SODIUM-DEXTROSE 2-3 GM-% IV SOLR
2.0000 g | INTRAVENOUS | Status: AC
  Administered 2015-03-27: 2 g via INTRAVENOUS
  Filled 2015-03-27: qty 50

## 2015-03-27 SURGICAL SUPPLY — 28 items
BAG DRAIN URO-CYSTO SKYTR STRL (DRAIN) ×4 IMPLANT
BAG URINE DRAINAGE (UROLOGICAL SUPPLIES) IMPLANT
BAG URINE LEG 19OZ MD ST LTX (BAG) IMPLANT
CANISTER SUCT LVC 12 LTR MEDI- (MISCELLANEOUS) IMPLANT
CATH FOLEY 2WAY SLVR  5CC 20FR (CATHETERS)
CATH FOLEY 2WAY SLVR  5CC 22FR (CATHETERS)
CATH FOLEY 2WAY SLVR 5CC 20FR (CATHETERS) IMPLANT
CATH FOLEY 2WAY SLVR 5CC 22FR (CATHETERS) IMPLANT
CLOTH BEACON ORANGE TIMEOUT ST (SAFETY) ×4 IMPLANT
DRSG TELFA 3X8 NADH (GAUZE/BANDAGES/DRESSINGS) IMPLANT
ELECT BUTTON BIOP 24F 90D PLAS (MISCELLANEOUS) IMPLANT
ELECT REM PT RETURN 9FT ADLT (ELECTROSURGICAL) ×4
ELECTRODE REM PT RTRN 9FT ADLT (ELECTROSURGICAL) ×2 IMPLANT
EVACUATOR MICROVAS BLADDER (UROLOGICAL SUPPLIES) IMPLANT
GLOVE BIO SURGEON STRL SZ7.5 (GLOVE) ×4 IMPLANT
GLOVE BIOGEL PI IND STRL 7.5 (GLOVE) ×4 IMPLANT
GLOVE BIOGEL PI INDICATOR 7.5 (GLOVE) ×4
GOWN STRL REUS W/ TWL XL LVL3 (GOWN DISPOSABLE) ×2 IMPLANT
GOWN STRL REUS W/TWL LRG LVL3 (GOWN DISPOSABLE) ×4 IMPLANT
GOWN STRL REUS W/TWL XL LVL3 (GOWN DISPOSABLE) ×2
HOLDER FOLEY CATH W/STRAP (MISCELLANEOUS) IMPLANT
MANIFOLD NEPTUNE II (INSTRUMENTS) ×4 IMPLANT
NEEDLE HYPO 22GX1.5 SAFETY (NEEDLE) IMPLANT
NS IRRIG 500ML POUR BTL (IV SOLUTION) IMPLANT
PACK CYSTO (CUSTOM PROCEDURE TRAY) ×4 IMPLANT
PLUG CATH AND CAP STER (CATHETERS) IMPLANT
SET ASPIRATION TUBING (TUBING) IMPLANT
WATER STERILE IRR 3000ML UROMA (IV SOLUTION) ×4 IMPLANT

## 2015-03-27 NOTE — Anesthesia Postprocedure Evaluation (Signed)
  Anesthesia Post-op Note  Patient: Timothy Kim  Procedure(s) Performed: Procedure(s) (LRB): CYSTOSCOPY WITH BLADDER  BIOPSY WITH FULGERATION (N/A)  Patient Location: PACU  Anesthesia Type: General  Level of Consciousness: awake and alert   Airway and Oxygen Therapy: Patient Spontanous Breathing  Post-op Pain: mild  Post-op Assessment: Post-op Vital signs reviewed, Patient's Cardiovascular Status Stable, Respiratory Function Stable, Patent Airway and No signs of Nausea or vomiting  Last Vitals:  Filed Vitals:   03/27/15 1130  BP: 140/64  Pulse: 66  Temp:   Resp: 10    Post-op Vital Signs: stable   Complications: No apparent anesthesia complications

## 2015-03-27 NOTE — Discharge Instructions (Signed)
Cystoscopy, Care After °Refer to this sheet in the next few weeks. These instructions provide you with information on caring for yourself after your procedure. Your caregiver may also give you more specific instructions. Your treatment has been planned according to current medical practices, but problems sometimes occur. Call your caregiver if you have any problems or questions after your procedure. °HOME CARE INSTRUCTIONS  °Things you can do to ease any discomfort after your procedure include: °· Drinking enough water and fluids to keep your urine clear or pale yellow. °· Taking a warm bath to relieve any burning feelings. °SEEK IMMEDIATE MEDICAL CARE IF:  °· You have an increase in blood in your urine. °· You notice blood clots in your urine. °· You have difficulty passing urine. °· You have the chills. °· You have abdominal pain. °· You have a fever or persistent symptoms for more than 2-3 days. °· You have a fever and your symptoms suddenly get worse. °MAKE SURE YOU:  °· Understand these instructions. °· Will watch your condition. °· Will get help right away if you are not doing well or get worse. °Document Released: 12/27/2004 Document Revised: 02/09/2013 Document Reviewed: 12/01/2011 °Exit[employer]® Patient Information ©2015 [employer]. This information is not intended to replace advice given to you by your health care provider. Make sure you discuss any questions you have with your health care provider. ° °Post Anesthesia Home Care Instructions ° °Activity: °Get plenty of rest for the remainder of the day. A responsible adult should stay with you for 24 hours following the procedure.  °For the next 24 hours, DO NOT: °-Drive a car °-Operate machinery °-Drink alcoholic beverages °-Take any medication unless instructed by your physician °-Make any legal decisions or sign important papers. ° °Meals: °Start with liquid foods such as gelatin or soup. Progress to regular foods as tolerated. Avoid greasy, spicy, heavy  foods. If nausea and/or vomiting occur, drink only clear liquids until the nausea and/or vomiting subsides. Call your physician if vomiting continues. ° °Special Instructions/Symptoms: °Your throat may feel dry or sore from the anesthesia or the breathing tube placed in your throat during surgery. If this causes discomfort, gargle with warm salt water. The discomfort should disappear within 24 hours. ° °If you had a scopolamine patch placed behind your ear for the management of post- operative nausea and/or vomiting: ° °1. The medication in the patch is effective for 72 hours, after which it should be removed.  Wrap patch in a tissue and discard in the trash. Wash hands thoroughly with soap and water. °2. You may remove the patch earlier than 72 hours if you experience unpleasant side effects which may include dry mouth, dizziness or visual disturbances. °3. Avoid touching the patch. Wash your hands with soap and water after contact with the patch. °  ° °

## 2015-03-27 NOTE — Transfer of Care (Signed)
Immediate Anesthesia Transfer of Care Note  Patient: Timothy Kim  Procedure(s) Performed: Procedure(s) (LRB): CYSTOSCOPY WITH BLADDER  BIOPSY WITH FULGERATION (N/A)  Patient Location: PACU  Anesthesia Type: General  Level of Consciousness: awake, oriented, sedated and patient cooperative  Airway & Oxygen Therapy: Patient Spontanous Breathing and Patient connected to face mask oxygen  Post-op Assessment: Report given to PACU RN and Post -op Vital signs reviewed and stable  Post vital signs: Reviewed and stable  Complications: No apparent anesthesia complications

## 2015-03-27 NOTE — Anesthesia Procedure Notes (Signed)
Procedure Name: LMA Insertion Date/Time: 03/27/2015 9:38 AM Performed by: Denna Haggard D Pre-anesthesia Checklist: Patient identified, Emergency Drugs available, Suction available and Patient being monitored Patient Re-evaluated:Patient Re-evaluated prior to inductionOxygen Delivery Method: Circle System Utilized Preoxygenation: Pre-oxygenation with 100% oxygen Intubation Type: IV induction Ventilation: Mask ventilation without difficulty LMA: LMA inserted LMA Size: 4.0 Number of attempts: 1 Airway Equipment and Method: Bite block Placement Confirmation: positive ETCO2 Tube secured with: Tape Dental Injury: Teeth and Oropharynx as per pre-operative assessment

## 2015-03-27 NOTE — Anesthesia Preprocedure Evaluation (Addendum)
Anesthesia Evaluation  Patient identified by MRN, date of birth, ID band Patient awake  General Assessment Comment:Past Medical History Diagnosis Date . ALLERGIC RHINITIS 02/09/2008   Qualifier: Diagnosis of By: Burnice Logan MD, Tennyson HYPERTROPHY 03/03/2007   Qualifier: Diagnosis of By: Rogue Bussing CMA, Maryann Alar  . GERD 03/03/2007   Qualifier: Diagnosis of By: Rogue Bussing CMA, Maryann Alar  . HYPERTENSION 03/03/2007   Qualifier: Diagnosis of By: Rogue Bussing CMA, Maryann Alar  . LOW BACK PAIN 06/09/2007   Qualifier: Diagnosis of By: Burnice Logan MD, Doretha Sou  . 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 OSTEOARTHRITIS 06/09/2007   Qualifier: Diagnosis of By: Burnice Logan MD, Doretha Sou  . Glaucoma  . Radiation 09/04/14-10/13/14   right retromolar region and right neck 60 Gy . CLL (chronic lymphocytic leukemia)    Stage 1 . Tonsillar cancer    Right Tonsil, invasive squamous cell . Cancer 05/2014   tonsil . DIABETES MELLITUS, TYPE II 03/03/2007   Qualifier: Diagnosis of By: Rogue Bussing CMA, Maryann Alar        Reviewed: Allergy & Precautions, NPO status , Patient's Chart, lab work & pertinent test results  Airway Mallampati: II  TM Distance: >3 FB Neck ROM: Full   Comment: Postoperative changes with operative flap to right maxilla. He denies pain with chewing nor clenching his jaw.  Neck CT reviewed. Dental  (+) Dental Advisory Given, Missing Several missing lateral front upper teeth:   Pulmonary neg pulmonary ROS,    Pulmonary exam normal breath sounds clear to auscultation       Cardiovascular hypertension, Pt. on medications and Pt. on home beta blockers + Peripheral Vascular Disease  Normal cardiovascular exam Rhythm:Regular Rate:Normal     Neuro/Psych glaucoma negative neurological  ROS  negative psych ROS   GI/Hepatic Neg liver ROS, GERD  ,H/O tonsillar cancer, derangement of TMJ   Endo/Other  Diet controlled DM  Renal/GU   negative genitourinary   Musculoskeletal  (+) Arthritis ,   Abdominal   Peds negative pediatric ROS (+)  Hematology  (+) Blood dyscrasia, anemia , CLL   Anesthesia Other Findings Tonsillar cancer  Reproductive/Obstetrics negative OB ROS                            Anesthesia Physical Anesthesia Plan  ASA: III  Anesthesia Plan: General   Post-op Pain Management:    Induction: Intravenous  Airway Management Planned: LMA  Additional Equipment:   Intra-op Plan:   Post-operative Plan:   Informed Consent:   Plan Discussed with: Surgeon  Anesthesia Plan Comments:         Anesthesia Quick Evaluation

## 2015-03-27 NOTE — H&P (Signed)
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  There was no muscle in the specimen.  Patient was brought back for cystoscopy to ensure no residual tumor and for staging.  The patient has been voiding without difficulty.  He's had no dysuria or gross hematuria.  No fever.   Past Medical History  Diagnosis Date  . Glaucoma     both eyes  . Allergic rhinitis   . BPH (benign prostatic hypertrophy)   . Organic impotence   . History of radiation to head and neck region 09-04-2014 to  10-13-2014    right retromolar region and right neck  60 Gy  . Venous insufficiency   . Pulmonary nodule     benign  and stable per last CT  . Hypertension   . OA (osteoarthritis)   . Dysphasia functional--  speech therapy    post op  radical neck dissection 07-21-2014--  changed diet to no chewy food , softer foods , states with the changes swallowed okay without any issues  . DDD (degenerative disc disease), cervical   . 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 Radiation-induced dermatitis   . Bilateral lower extremity edema     feet  . Macular degeneration     right eye only  . History of tracheostomy [facility])     post op radical neck dissection  07-21-2014--  post op tracheostomy on 07-25-2014  decannulated and sutured 07-29-2014  . Alcohol abuse   . Borderline diabetes   . History of gastroesophageal reflux (GERD)   . DDD (degenerative disc disease), lumbosacral   . Nephrolithiasis     right side--  per CT from [facility] 01-17-2015 non-obstructing  . Anemia in neoplastic disease     chronic  . History of radiation therapy     09-04-2014 to 10-13-2014  Right  retromolar region (tumor bed) and right neck/  60Gy in 30 fractions to tumor bed and 54Gy in 30 fractions to neck  . CLL (chronic lymphocytic leukemia) [facility]) oncologist-  dr Heath Lark ([facility])    dx Jan 2014 --- Stage 1--  currently asymptomatic as of Apr 2016 and No active disease  . Tonsillar cancer [facility])     dx Dec 2015---  Right Tonsil, invasive squamous cell   . Squamous cell carcinoma of retromolar trigone [facility]) oncologist-  dr Isidore Moos    Stage IVB, pT1b, pN0, Grade 2, +PNI, no LVSI---  S/P  RADICAL NECK DISSECTION AND RADIATION  . Transitional cell carcinoma of bladder [facility]) urologist-- dr Junious Silk    High - grade   Past Surgical History  Procedure Laterality Date  . Rotator cuff repair Right 12-11-2000  . Tonsil biopsy Right 05/24/14  . Maxillectomy Right 07/21/14,   [facility]    Radical Neck Dissection, Maxillectomy, Right Marginal Mandibulectomy, dental extractions, Parascauplar fasciocutaeous Free flap reconstruction  . Laminectomy with posterior lateral arthrodesis level 1  09-01-2003    L3 -5 LAMINECTOMY W/ DECOMPRESSION AND FUSION L4-5  . Cataract extraction w/ intraocular lens  implant, bilateral    . Retinal detachment repair w/ scleral buckle le  11-03-2000  . Inguinal hernia repair Bilateral right 1988//  left 1970's  . Transurethral  resection of bladder tumor N/A 02/02/2015    Procedure: TRANSURETHRAL RESECTION OF BLADDER TUMOR (TURBT);  Surgeon: Lowella Bandy, MD;  Location: [facility];  Service: Urology;  Laterality: N/A;  . Cystoscopy N/A 02/02/2015    Procedure: CYSTOSCOPY, INSTILLATION OF MITOMYCIN C;  Surgeon: Lowella Bandy, MD;  Location: [facility];  Service: Urology;  Laterality: N/A;    Home Medications:  Prescriptions prior to admission  Medication Sig Dispense Refill Last Dose  . brimonidine-timolol (COMBIGAN) 0.2-0.5 % ophthalmic solution Apply 1 drop to eye 2 (two) times daily.    03/26/2015 at Unknown time  . DHEA 50 MG TABS  Take 1 tablet by mouth daily.   03/26/2015 at Unknown time  . furosemide (LASIX) 20 MG tablet Take 1 tablet (20 mg total) by mouth daily. (Patient taking differently: Take 20 mg by mouth every morning. ) 30 tablet 3 03/26/2015 at Unknown time  . Krill Oil 300 MG CAPS Take 300 mg by mouth daily.   Past Week at Unknown time  . latanoprost (XALATAN) 0.005 % ophthalmic solution Place 1 drop into both eyes every evening.   4 Past Week at Unknown time  . Lutein-Zeaxanthin 15-0.7 MG CAPS Take 1 tablet by mouth daily.   03/26/2015 at Unknown time  . metoprolol succinate (TOPROL-XL) 100 MG 24 hr tablet TAKE HALF TABLET IMMEDIATELY FOLLOWING A MEAL.  Currently taking 50mg  tab per his PC (Patient taking differently: Take 50 mg by mouth every morning. TAKE HALF TABLET IMMEDIATELY FOLLOWING A MEAL.  Currently taking 50mg  tab per his PC) 90 tablet 3 03/26/2015 at Unknown time  . naproxen sodium (ANAPROX) 220 MG tablet Take 220 mg by mouth 1 day or 1 dose.   03/26/2015 at Unknown time  . sodium fluoride (PREVIDENT 5000 PLUS) 1.1 % CREA dental cream Apply thin ribbon of cream to tooth brush. Brush teeth for 2 minutes. Spit out excess. Repeat nightly. (Patient taking differently: Place 1 application onto teeth at bedtime. Apply thin ribbon of cream to tooth brush. Brush teeth for 2 minutes. Spit out excess. Repeat nightly.) 1 Tube prn 03/26/2015 at Unknown time  . triamcinolone cream (KENALOG) 0.1 % APPLY TO AFFECTED AREA TWICE DAILY 80 g 2 03/27/2015 at 0700  . zolpidem (AMBIEN) 5 MG tablet TAKE 1 TABLET AT BEDTIME AS NEEDED FOR SLEEP 30 tablet 2 Past Month at Unknown time  . sildenafil (VIAGRA) 100 MG tablet Take 100 mg by mouth daily as needed for erectile dysfunction.   More than a month at Unknown time  . sodium chloride (OCEAN) 0.65 % nasal spray Place 2 sprays into the nose as needed.    More than a month at Unknown time  . tadalafil (CIALIS) 20 MG tablet Take 20 mg by mouth daily as needed for erectile dysfunction.    Unknown at Unknown time   Allergies: No Known Allergies  Family History  Problem Relation Age of Onset  . Cancer Mother     lung ca  . Alcohol abuse Sister   . Hypertension Neg Hx     family hx  . Sudden death Neg Hx     famiylhx   Social History:  reports that he has never smoked. He has never used smokeless tobacco. He reports that he drinks about 16.8 oz of alcohol per week. He reports that he does not use illicit drugs.  ROS: A complete review of systems was performed.  All systems are negative except for pertinent findings as noted. ROS  Physical Exam:  Vital signs in last 24 hours: Temp:  [98.4 F (36.9 C)] 98.4 F (36.9 C) (10/04 0805) Pulse Rate:  [64] 64 (10/04 0805) Resp:  [16] 16 (10/04 0805) BP: (147)/(72) 147/72 mmHg (10/04 0805) SpO2:  [100 %] 100 % (10/04 0805) Weight:  [63.504 kg (140 lb)] 63.504 kg (140 lb) (10/04 0805) General:  Alert and oriented, No acute distress HEENT: Normocephalic, atraumatic Neck: No JVD or lymphadenopathy Cardiovascular: Regular rate and rhythm Lungs: Regular rate and effort Abdomen: Soft, nontender, nondistended, no abdominal masses Back: No CVA tenderness Extremities: No edema Neurologic: Grossly intact  Laboratory Data:  Results for orders placed or performed during the hospital encounter of 03/27/15 (from the past 24 hour(s))  I-STAT 4, (NA,K, GLUC, HGB,HCT)     Status: None   Collection Time: 03/27/15  8:54 AM  Result Value Ref Range   Sodium 136 135 - 145 mmol/L   Potassium 3.7 3.5 - 5.1 mmol/L   Glucose, Bld 96 65 - 99 mg/dL   HCT 42.0 39.0 - 52.0 %   Hemoglobin 14.3 13.0 - 17.0 g/dL   No results found for this or any previous visit (from the past 240 hour(s)). Creatinine: No results for input(s): CREATININE in the last 168 hours.  Impression/Assessment:  Urothelial carcinoma right lateral, high-grade TA, 3 cm. I discussed with the patient and his wife the nature of urothelial carcinoma, stage grade and  prognosis has known of the current time and consideration of surveillance or BCG depending on today's biopsies.  They will consider.  Plan: I discussed with the patient the nature, potential benefits, risks and alternatives to Cystoscopy, bladder biopsy, possible TUR, including side effects of the proposed treatment, the likelihood of the patient achieving the goals of the procedure, and any potential problems that might occur during the procedure or recuperation. Discuss possibility of needing Foley catheter postop.  Discussed risk of bleeding infection and bladder perforation among others.All questions answered. Patient elects to proceed.   Timothy Kim 03/27/2015, 9:35 AM

## 2015-03-27 NOTE — Op Note (Addendum)
Preoperative diagnosis: High-grade urothelial carcinoma lateral and posterior bladder wall Postoperative diagnosis: Same  Procedure: Cystoscopy, bladder biopsy and fulguration, 2 - 5 cm  Surgeon: Junious Silk  Anesthesia: Gen.  Indication for procedure: 81 year old with large high-grade TA lesion with no invasion into the submucosa. No muscle and specimen. He was brought back for repeat staging and to ensure complete resection.  Findings: On exam under anesthesia the penis was normal without mass or lesion. The testicles were descended bilaterally and palpably normal. The may been a subtle left hydrocele. On digital rectal exam the prostate was small and benign without hard area or nodule.  On cystoscopy the urethra was quite tight and even the 22 [place] seem to dilate the urethra which was whitish in nature from some inflammation. The prostatic urethra was normal. In the trigone and ureteral orifices were normal and had clear efflux. The bladder was normal apart from a large area of granulation tissue on the right posterior and lateral bladder wall. The granulation tissue was cleaned off and the resection was clearly down to muscle. There was no clinically visible tumor. The tissue is just a bit to indurated to get a good bite for a biopsy. It was obviously thin in this area compared to the other areas and I thought resection would be high risk for perforation. It seemed to me that it was an excellent resection from the first time well down to muscle.  Description of procedure: After consent was obtained patient brought to the operating room. After adequate anesthesia his placed in lithotomy position and prepped and draped in the usual sterile fashion. A timeout was performed to confirm the patient and procedure. An exam under anesthesia was performed. A B&O suppository was placed per urethra. A past the cystoscope per urethra and carefully examined the bladder. Then used the flexible biopsy forceps to  clear off some of the granulation tissue from the wound. The base of the tumor in several places was very clear in obviously down to muscle. I couldn't really get any bite with the flexible biopsy forceps as the tissue was seem to be rather tense and not very pliable. Therefore up with the rigid biopsy forceps and and was clear down to the muscle in several places but again could not give a good bite. There was a good depression compared to the rest of the bladder with a good depth. The wall of the resection site seen thin and I believe there would be high risk of perforation in this location with a repeat TUR. Therefore I sent the biopsies I could get clinically it appeared to be a good resection and no residual tumor. The areas biopsied were fulgurated I was not concerned about risk of perforation. The bladder was drained and the scope removed. The patient was awakened and taken to the recovery room in stable condition.  Complications: None  Blood loss: Minimal  Specimens: Bladder biopsy tumor base  Drains: None

## 2015-03-28 ENCOUNTER — Encounter (HOSPITAL_BASED_OUTPATIENT_CLINIC_OR_DEPARTMENT_OTHER): Payer: Self-pay | Admitting: Urology

## 2015-04-03 ENCOUNTER — Ambulatory Visit (INDEPENDENT_AMBULATORY_CARE_PROVIDER_SITE_OTHER): Payer: Medicare Other | Admitting: *Deleted

## 2015-04-03 DIAGNOSIS — Z23 Encounter for immunization: Secondary | ICD-10-CM

## 2015-04-05 DIAGNOSIS — H6981 Other specified disorders of Eustachian tube, right ear: Secondary | ICD-10-CM | POA: Diagnosis not present

## 2015-04-05 DIAGNOSIS — H903 Sensorineural hearing loss, bilateral: Secondary | ICD-10-CM | POA: Diagnosis not present

## 2015-04-17 DIAGNOSIS — H6981 Other specified disorders of Eustachian tube, right ear: Secondary | ICD-10-CM | POA: Diagnosis not present

## 2015-04-17 DIAGNOSIS — Z923 Personal history of irradiation: Secondary | ICD-10-CM | POA: Diagnosis not present

## 2015-04-17 DIAGNOSIS — H9113 Presbycusis, bilateral: Secondary | ICD-10-CM | POA: Diagnosis not present

## 2015-04-17 DIAGNOSIS — Z85818 Personal history of malignant neoplasm of other sites of lip, oral cavity, and pharynx: Secondary | ICD-10-CM | POA: Diagnosis not present

## 2015-04-18 ENCOUNTER — Telehealth: Payer: Self-pay | Admitting: *Deleted

## 2015-04-18 NOTE — Telephone Encounter (Signed)
  Oncology Nurse Navigator Documentation   Navigator Encounter Type: Telephone;6 month (04/18/15 1549)   Treatment Phase: Other (04/18/15 1549)     Called patient with 6-m post-tmt follow-up to check on his well being. He reported:  Began experiencing very dry mouth 3-4 weeks ago, noted this was not a SE he experienced during or shortly after RT.  He noted it is particularly troublesome during night as he is a mouth breather interferes with sleep.  He noted he is rinsing with  Biotene during day.  I suggested he try applying Biotene gel at bedtime and/or consider a humidifier for additional management.  Continuing to have difficulty chewing food, particularly meats, r/t jaw surgery earlier this year.  He indicated he experiments with different foods to identify those that are more easily managed.    Denied difficulty drinking liquids.  Maintaining weight at about 148 lbs.  Denied pain issues r/t jaw surgery/RT.  Noted he is having a recurrence of back pain r/t to spinal stenosis for which he is going to make an appt for follow-up. He confirmed understanding of next appt with Dr. Isidore Moos 07/27/2015. He understands he can contact me with needs/concerns.  Gayleen Orem, RN, BSN, [place] [facility] [place] [phone]               Time Spent with Patient: 15 (04/18/15 1549)

## 2015-05-02 DIAGNOSIS — J310 Chronic rhinitis: Secondary | ICD-10-CM | POA: Diagnosis not present

## 2015-05-02 DIAGNOSIS — H6981 Other specified disorders of Eustachian tube, right ear: Secondary | ICD-10-CM | POA: Diagnosis not present

## 2015-05-02 DIAGNOSIS — J324 Chronic pansinusitis: Secondary | ICD-10-CM | POA: Diagnosis not present

## 2015-05-03 ENCOUNTER — Encounter: Payer: Self-pay | Admitting: Internal Medicine

## 2015-05-09 DIAGNOSIS — Z923 Personal history of irradiation: Secondary | ICD-10-CM | POA: Diagnosis not present

## 2015-05-09 DIAGNOSIS — C062 Malignant neoplasm of retromolar area: Secondary | ICD-10-CM | POA: Diagnosis not present

## 2015-06-05 ENCOUNTER — Encounter: Payer: Self-pay | Admitting: Internal Medicine

## 2015-06-05 ENCOUNTER — Ambulatory Visit (INDEPENDENT_AMBULATORY_CARE_PROVIDER_SITE_OTHER): Payer: Medicare Other | Admitting: Internal Medicine

## 2015-06-05 VITALS — BP 118/70 | HR 64 | Temp 99.5°F | Ht 64.96 in | Wt 135.0 lb

## 2015-06-05 DIAGNOSIS — R634 Abnormal weight loss: Secondary | ICD-10-CM | POA: Diagnosis not present

## 2015-06-05 DIAGNOSIS — C9110 Chronic lymphocytic leukemia of B-cell type not having achieved remission: Secondary | ICD-10-CM

## 2015-06-05 DIAGNOSIS — Z23 Encounter for immunization: Secondary | ICD-10-CM | POA: Diagnosis not present

## 2015-06-05 DIAGNOSIS — Z299 Encounter for prophylactic measures, unspecified: Secondary | ICD-10-CM

## 2015-06-05 DIAGNOSIS — C148 Malignant neoplasm of overlapping sites of lip, oral cavity and pharynx: Secondary | ICD-10-CM | POA: Diagnosis not present

## 2015-06-05 DIAGNOSIS — C062 Malignant neoplasm of retromolar area: Secondary | ICD-10-CM | POA: Diagnosis not present

## 2015-06-05 DIAGNOSIS — I10 Essential (primary) hypertension: Secondary | ICD-10-CM | POA: Diagnosis not present

## 2015-06-05 DIAGNOSIS — E119 Type 2 diabetes mellitus without complications: Secondary | ICD-10-CM | POA: Diagnosis not present

## 2015-06-05 MED ORDER — ZOLPIDEM TARTRATE 5 MG PO TABS: 5.0000 mg | ORAL_TABLET | Freq: Every evening | ORAL | Status: DC | PRN

## 2015-06-05 NOTE — Progress Notes (Signed)
Patient ID: Timothy Kim, male   DOB: 10-Jan-1930, 81 y.o.   MRN: [id number]  Subjective:    Patient ID: Timothy Kim, male    DOB: Nov 04, 1929, 81 y.o.   MRN: [id number]  Hypertension Pertinent negatives include no chest pain, headaches, neck pain, palpitations or shortness of breath.    History of Present Illness:   81 year-old patient who is seen today for a comprehensive evaluation.  He has a history of treated hypertension. He has additionally, gastroesophageal reflux disease, and a history of diet-controlled diabetes. He has osteoarthritis and chronic low back pain. He has quite well except for his chronic low back pain.  He has had worsening low back pain and has scheduled orthopedic followup. He continues to be followed annually at [facility] for eye care and also locally by Dr. Katy Fitch. He is now  followed by oncology for stage I CLL.  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  He has seen ENT recently and now has a right tympanoplasty tube.  He has been treated recently for a sinus infection. Main complaints today include lower extremity edema in spite of Lasix support hose.  He has seen urology recently and is status post TURBT for malignancy.  No further gross hematuria.  Here for  Medicare AWV:   1. Risk factors based on Past M, S, F history: cardiovascular risk factors include a history of hypertension and diet controlled diabetes  2. Physical Activities: no rigorous exercise activities, markedly limited due to poor functional status.  He has had a difficult time maintaining weight and remains weak 3. Depression/mood: no history of depression  4. Hearing: no hearing deficits  5. ADL's: Significant functional decline over the past year with weight loss and weakness 6. Fall Risk: High due to general debility 7. Home Safety: no problems identified  8. Height, weight, &visual  acuity:height and weight are stable. Has been recently diagnosed with glaucoma and is followed closely by ophthalmology; he is also followed for macular degeneration 9. Counseling: Restricted salt diet discussed and encouraged 10. Labs ordered based on risk factors: Recent laboratory studies and consultant notes reviewed  11. Referral Coordination- will follow-up with his orthopedic physician, ENT, ophthalmology and oncology 12. Care Plan- will continue general supportive measures.  Attempts at weight gain.  Discussed and encouraged.  Follow-up multiple consultants 13. Cognitive Assessment- alert and oriented, with normal affect no cognitive impairment 14.  Preventive services will include annual clinical exams with screening lab 15.  Provider list update includes ophthalmology, orthopedics, primary care oncology and ENT  Preventive Screening-Counseling & Management  Alcohol-Tobacco  Smoking Status: never   Allergies (verified):  No Known Drug Allergies   Past History:  Past Medical History:  Diabetes mellitus, type II  GERD  Hypertension  Benign prostatic hypertrophy  Low back pain  Osteoarthritis  history of EtOH  Allergic rhinitis  Glaucoma [facility] ([facility])  Macular degeneration  Past Surgical History:  Inguinal herniorrhaphy x 2  Rotator cuff repair 2002  Flexible Sigmoidoscopy 20 yrs ago  status post laminectomy March 2005  history retinal detachment  Cataract extraction   Family History:   Family History of Alcoholism/Addiction  Family History of Colon CA 1st degree relative <60  Family History Hypertension  Family History of Sudden Death  Father died age 73-unclear causes  Mother died age 44 Lung Ca  One  brother age 60-DJD  One Sister died age 77- ETOH   Social History:    Retired  Single  Regular exercise-no  Never Smoked  MOD to heavy 2-3   Past Medical History  Diagnosis Date  . Glaucoma     both eyes  . Allergic rhinitis   . BPH  (benign prostatic hypertrophy)   . Organic impotence   . History of radiation to head and neck region 09-04-2014 to  10-13-2014    right retromolar region and right neck  60 Gy  . Venous insufficiency   . Pulmonary nodule     benign  and stable per last CT  . Hypertension   . OA (osteoarthritis)   . Dysphasia functional--  speech therapy    post op  radical neck dissection 07-21-2014--  changed diet to no chewy food , softer foods , states with the changes swallowed okay without any issues  . DDD (degenerative disc disease), cervical   . 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 Radiation-induced dermatitis   . Bilateral lower extremity edema     feet  . Macular degeneration     right eye only  . History of tracheostomy [facility])     post op radical neck dissection  07-21-2014--  post op tracheostomy on 07-25-2014  decannulated and sutured 07-29-2014  . Alcohol abuse   . Borderline diabetes   . History of gastroesophageal reflux (GERD)   . DDD (degenerative disc disease), lumbosacral   . Nephrolithiasis     right side--  per CT from [facility] 01-17-2015 non-obstructing  . Anemia in neoplastic disease     chronic  . History of radiation therapy     09-04-2014 to 10-13-2014  Right retromolar region (tumor bed) and right neck/  60Gy in 30 fractions to tumor bed and 54Gy in 30 fractions to neck  . CLL (chronic lymphocytic leukemia) [facility]) oncologist-  dr Heath Lark ([facility])    dx Jan 2014 --- Stage 1--  currently asymptomatic as of Apr 2016 and No active disease  . 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) oncologist-  dr Isidore Moos    Stage IVB, pT1b, pN0, Grade 2, +PNI, no LVSI---  S/P  RADICAL NECK  DISSECTION AND RADIATION  . Transitional cell carcinoma of bladder [facility]) urologist-- dr Milana Na - grade    Social History   Social History  . Marital Status: Single    Spouse Name: N/A  . Number of Children: 2  . Years of Education: N/A   Occupational History  . Not on file.   Social History Main Topics  . Smoking status: Never Smoker   . Smokeless tobacco: Never Used  . Alcohol Use: 16.8 oz/week    28 Shots of liquor per week     Comment: Gin and vodka 3-4 daily   (Hx alcohol withdrawal )  . Drug Use: No  . Sexual Activity: Not on file   Other Topics Concern  . Not on file   Social History Narrative   Patient is divorced with 2 children.   Patient has never smoked. Patient has never used smokeless tobacco.    Patient drinks gin or vodka on  a daily basis.    Past Surgical History  Procedure Laterality Date  . Rotator cuff repair Right 12-11-2000  . Tonsil biopsy Right 05/24/14  . Maxillectomy Right 07/21/14,   [facility]    Radical Neck Dissection, Maxillectomy, Right Marginal Mandibulectomy, dental extractions, Parascauplar fasciocutaeous Free flap reconstruction  . Laminectomy with posterior lateral arthrodesis level 1  09-01-2003    L3 -5 LAMINECTOMY W/ DECOMPRESSION AND FUSION L4-5  . Cataract extraction w/ intraocular lens  implant, bilateral    . Retinal detachment repair w/ scleral buckle le  11-03-2000  . Inguinal hernia repair Bilateral right 1988//  left 1970's  . Transurethral resection of bladder tumor N/A 02/02/2015    Procedure: TRANSURETHRAL RESECTION OF BLADDER TUMOR (TURBT);  Surgeon: Lowella Bandy, MD;  Location: [facility];  Service: Urology;  Laterality: N/A;  . Cystoscopy N/A 02/02/2015    Procedure: CYSTOSCOPY, INSTILLATION OF MITOMYCIN C;  Surgeon: Lowella Bandy, MD;  [facility];  Service: Urology;  Laterality: N/A;  . Cystoscopy with biopsy N/A 03/27/2015    Procedure: CYSTOSCOPY WITH BLADDER  BIOPSY WITH  FULGERATION;  Surgeon: Festus Aloe, MD;  Location: [facility];  Service: Urology;  Laterality: N/A;    Family History  Problem Relation Age of Onset  . Cancer Mother     lung ca  . Alcohol abuse Sister   . Hypertension Neg Hx     family hx  . Sudden death Neg Hx     famiylhx    No Known Allergies  Current Outpatient Prescriptions on File Prior to Visit  Medication Sig Dispense Refill  . brimonidine-timolol (COMBIGAN) 0.2-0.5 % ophthalmic solution Apply 1 drop to eye 2 (two) times daily.     Marland Kitchen DHEA 50 MG TABS Take 1 tablet by mouth daily.    . furosemide (LASIX) 20 MG tablet Take 1 tablet (20 mg total) by mouth daily. (Patient taking differently: Take 20 mg by mouth every morning. ) 30 tablet 3  . Krill Oil 300 MG CAPS Take 300 mg by mouth daily.    Marland Kitchen latanoprost (XALATAN) 0.005 % ophthalmic solution Place 1 drop into both eyes every evening.   4  . Lutein-Zeaxanthin 15-0.7 MG CAPS Take 1 tablet by mouth daily.    . metoprolol succinate (TOPROL-XL) 100 MG 24 hr tablet TAKE HALF TABLET IMMEDIATELY FOLLOWING A MEAL.  Currently taking 50mg  tab per his PC (Patient taking differently: Take 50 mg by mouth every morning. TAKE HALF TABLET IMMEDIATELY FOLLOWING A MEAL.  Currently taking 50mg  tab per his PC) 90 tablet 3  . naproxen sodium (ANAPROX) 220 MG tablet Take 220 mg by mouth 1 day or 1 dose.    . sildenafil (VIAGRA) 100 MG tablet Take 100 mg by mouth daily as needed for erectile dysfunction.    . sodium chloride (OCEAN) 0.65 % nasal spray Place 2 sprays into the nose as needed.     . sodium fluoride (PREVIDENT 5000 PLUS) 1.1 % CREA dental cream Apply thin ribbon of cream to tooth brush. Brush teeth for 2 minutes. Spit out excess. Repeat nightly. (Patient taking differently: Place 1 application onto teeth at bedtime. Apply thin ribbon of cream to tooth brush. Brush teeth for 2 minutes. Spit out excess. Repeat nightly.) 1 Tube prn  . tadalafil (CIALIS) 20 MG tablet Take  20 mg by mouth daily as needed for erectile dysfunction.    . triamcinolone cream (KENALOG) 0.1 % APPLY TO AFFECTED AREA TWICE DAILY 80  g 2  . zolpidem (AMBIEN) 5 MG tablet TAKE 1 TABLET AT BEDTIME AS NEEDED FOR SLEEP 30 tablet 2   No current facility-administered medications on file prior to visit.    BP 118/70 mmHg  Pulse 64  Temp(Src) 99.5 F (37.5 C) (Oral)  Ht 5' 4.96" (1.65 m)  Wt 135 lb (61.236 kg)  BMI 22.49 kg/m2  SpO2 90%      Review of Systems  Constitutional: Positive for activity change, appetite change, fatigue and unexpected weight change. Negative for fever and chills.  HENT: Positive for rhinorrhea and sinus pressure. Negative for congestion, dental problem, ear pain, hearing loss, mouth sores, sneezing, tinnitus, trouble swallowing and voice change.   Eyes: Negative for photophobia, pain, redness and visual disturbance.  Respiratory: Positive for cough. Negative for apnea, choking, chest tightness, shortness of breath and wheezing.   Cardiovascular: Positive for leg swelling. Negative for chest pain and palpitations.  Gastrointestinal: Negative for nausea, vomiting, abdominal pain, diarrhea, constipation, blood in stool, abdominal distention, anal bleeding and rectal pain.  Genitourinary: Negative for dysuria, urgency, frequency, hematuria, flank pain, decreased urine volume, discharge, penile swelling, scrotal swelling, difficulty urinating, genital sores and testicular pain.  Musculoskeletal: Positive for back pain. Negative for myalgias, joint swelling, arthralgias, gait problem, neck pain and neck stiffness.  Skin: Positive for rash. Negative for color change and wound.  Neurological: Positive for weakness. Negative for dizziness, tremors, seizures, syncope, facial asymmetry, speech difficulty, light-headedness, numbness and headaches.  Hematological: Negative for adenopathy. Does not bruise/bleed easily.  Psychiatric/Behavioral: Negative for suicidal ideas,  hallucinations, behavioral problems, confusion, sleep disturbance, self-injury, dysphoric mood, decreased concentration and agitation. The patient is not nervous/anxious.        Objective:   Physical Exam  Constitutional: He appears well-developed and well-nourished.  HENT:  Head: Normocephalic and atraumatic.  Right Ear: External ear normal.  Left Ear: External ear normal.  Nose: Nose normal.  Mouth/Throat: Oropharynx is clear and moist.  Tympanoplasty tube.  Right TM  Eyes: Conjunctivae and EOM are normal. Pupils are equal, round, and reactive to light. No scleral icterus.  Neck: Normal range of motion. Neck supple. No JVD present. No thyromegaly present.  Status post right radical neck  Cardiovascular: Regular rhythm, normal heart sounds and intact distal pulses.  Exam reveals no gallop and no friction rub.   No murmur heard. Dorsalis pedis pulses full.  Posterior tibial pulses difficult to palpate due to edema  Pulmonary/Chest: Effort normal and breath sounds normal. He exhibits no tenderness.  Abdominal: Soft. Bowel sounds are normal. He exhibits no distension and no mass. There is no tenderness.  Genitourinary: Penis normal. Guaiac negative stool.  Prostate +2 enlarged  Musculoskeletal: Normal range of motion. He exhibits edema. He exhibits no tenderness.  Plus 3 ankle edema. Full peripheral pulses  Lymphadenopathy:    He has no cervical adenopathy.  Neurological: He is alert. He has normal reflexes. No cranial nerve deficit. Coordination normal.  Skin: Skin is warm and dry. No rash noted.  Status post right radical neck Surgical scar right lateral chest wall Multiple ecchymoses   Psychiatric: He has a normal mood and affect. His behavior is normal.          Assessment & Plan:  Preventive health exam  Hypertension well controlled  Osteoarthritis. Followup orthopedics  Status post TURBT .  Follow-up urology CLL.  Status post right radical neck dissection for  retromolar trigone squamous cell carcinoma.  Follow-up oncology Diet-controlled diabetes   Laboratory studies  reviewed Follow-up multiple consultants  Return here in 6 months or as needed  Information concerning low-salt diet, dispensed

## 2015-06-05 NOTE — Patient Instructions (Signed)
Limit your sodium (Salt) intake  Return in 6 months for follow-up  Low-Sodium Eating Plan Sodium raises blood pressure and causes water to be held in the body. Getting less sodium from food will help lower your blood pressure, reduce any swelling, and protect your heart, liver, and kidneys. We get sodium by adding salt (sodium chloride) to food. Most of our sodium comes from canned, boxed, and frozen foods. Restaurant foods, fast foods, and pizza are also very high in sodium. Even if you take medicine to lower your blood pressure or to reduce fluid in your body, getting less sodium from your food is important. WHAT IS MY PLAN? Most people should limit their sodium intake to 2,300 mg a day. Your health care provider recommends that you limit your sodium intake to __________ a day.  WHAT DO I NEED TO KNOW ABOUT THIS EATING PLAN? For the low-sodium eating plan, you will follow these general guidelines:  Choose foods with a % Daily Value for sodium of less than 5% (as listed on the food label).   Use salt-free seasonings or herbs instead of table salt or sea salt.   Check with your health care provider or pharmacist before using salt substitutes.   Eat fresh foods.  Eat more vegetables and fruits.  Limit canned vegetables. If you do use them, rinse them well to decrease the sodium.   Limit cheese to 1 oz (28 g) per day.   Eat lower-sodium products, often labeled as "lower sodium" or "no salt added."  Avoid foods that contain monosodium glutamate (MSG). MSG is sometimes added to [place] food and some canned foods.  Check food labels (Nutrition Facts labels) on foods to learn how much sodium is in one serving.  Eat more home-cooked food and less restaurant, buffet, and fast food.  When eating at a restaurant, ask that your food be prepared with less salt, or no salt if possible.  HOW DO I READ FOOD LABELS FOR SODIUM INFORMATION? The Nutrition Facts label lists the amount of  sodium in one serving of the food. If you eat more than one serving, you must multiply the listed amount of sodium by the number of servings. Food labels may also identify foods as:  Sodium free--Less than 5 mg in a serving.  Very low sodium--35 mg or less in a serving.  Low sodium--140 mg or less in a serving.  Light in sodium--50% less sodium in a serving. For example, if a food that usually has 300 mg of sodium is changed to become light in sodium, it will have 150 mg of sodium.  Reduced sodium--25% less sodium in a serving. For example, if a food that usually has 400 mg of sodium is changed to reduced sodium, it will have 300 mg of sodium. WHAT FOODS CAN I EAT? Grains Low-sodium cereals, including oats, puffed wheat and rice, and shredded wheat cereals. Low-sodium crackers. Unsalted rice and pasta. Lower-sodium bread.  Vegetables Frozen or fresh vegetables. Low-sodium or reduced-sodium canned vegetables. Low-sodium or reduced-sodium tomato sauce and paste. Low-sodium or reduced-sodium tomato and vegetable juices.  Fruits Fresh, frozen, and canned fruit. Fruit juice.  Meat and Other Protein Products Low-sodium canned tuna and salmon. Fresh or frozen meat, poultry, seafood, and fish. Lamb. Unsalted nuts. Dried beans, peas, and lentils without added salt. Unsalted canned beans. Homemade soups without salt. Eggs.  Dairy Milk. Soy milk. Ricotta cheese. Low-sodium or reduced-sodium cheeses. Yogurt.  Condiments Fresh and dried herbs and spices. Salt-free seasonings. Onion and  garlic powders. Low-sodium varieties of mustard and ketchup. Fresh or refrigerated horseradish. Lemon juice.  Fats and Oils Reduced-sodium salad dressings. Unsalted butter.  Other Unsalted popcorn and pretzels.  The items listed above may not be a complete list of recommended foods or beverages. Contact your dietitian for more options. WHAT FOODS ARE NOT RECOMMENDED? Grains Instant hot cereals. Bread  stuffing, pancake, and biscuit mixes. Croutons. Seasoned rice or pasta mixes. Noodle soup cups. Boxed or frozen macaroni and cheese. Self-rising flour. Regular salted crackers. Vegetables Regular canned vegetables. Regular canned tomato sauce and paste. Regular tomato and vegetable juices. Frozen vegetables in sauces. Salted [place] fries. Olives. [name]. Relishes. Sauerkraut. Salsa. Meat and Other Protein Products Salted, canned, smoked, spiced, or pickled meats, seafood, or fish. Bacon, ham, sausage, hot dogs, corned beef, chipped beef, and packaged luncheon meats. Salt pork. Jerky. Pickled herring. Anchovies, regular canned tuna, and sardines. Salted nuts. Dairy Processed cheese and cheese spreads. Cheese curds. Blue cheese and cottage cheese. Buttermilk.  Condiments Onion and garlic salt, seasoned salt, table salt, and sea salt. Canned and packaged gravies. Worcestershire sauce. Tartar sauce. Barbecue sauce. Teriyaki sauce. Soy sauce, including reduced sodium. Steak sauce. Fish sauce. Oyster sauce. Cocktail sauce. Horseradish that you find on the shelf. Regular ketchup and mustard. Meat flavorings and tenderizers. Bouillon cubes. Hot sauce. Tabasco sauce. Marinades. Taco seasonings. Relishes. Fats and Oils Regular salad dressings. Salted butter. Margarine. Ghee. Bacon fat.  Other Potato and tortilla chips. Corn chips and puffs. Salted popcorn and pretzels. Canned or dried soups. Pizza. Frozen entrees and pot pies.  The items listed above may not be a complete list of foods and beverages to avoid. Contact your dietitian for more information.   This information is not intended to replace advice given to you by your health care provider. Make sure you discuss any questions you have with your health care provider.   Document Released: 11/29/2001 Document Revised: 06/30/2014 Document Reviewed: 04/13/2013 Elsevier Interactive Patient Education [employer].

## 2015-06-05 NOTE — Progress Notes (Signed)
Pre visit review using our clinic review tool, if applicable. No additional management support is needed unless otherwise documented below in the visit note. 

## 2015-06-06 DIAGNOSIS — H353133 Nonexudative age-related macular degeneration, bilateral, advanced atrophic without subfoveal involvement: Secondary | ICD-10-CM | POA: Diagnosis not present

## 2015-06-06 DIAGNOSIS — H353212 Exudative age-related macular degeneration, right eye, with inactive choroidal neovascularization: Secondary | ICD-10-CM | POA: Diagnosis not present

## 2015-06-06 DIAGNOSIS — H35362 Drusen (degenerative) of macula, left eye: Secondary | ICD-10-CM | POA: Diagnosis not present

## 2015-06-06 DIAGNOSIS — H43811 Vitreous degeneration, right eye: Secondary | ICD-10-CM | POA: Diagnosis not present

## 2015-06-08 ENCOUNTER — Other Ambulatory Visit: Payer: Self-pay | Admitting: Internal Medicine

## 2015-07-03 ENCOUNTER — Other Ambulatory Visit (HOSPITAL_COMMUNITY): Payer: Self-pay | Admitting: Dentistry

## 2015-07-03 MED ORDER — SODIUM FLUORIDE 1.1 % DT CREA: TOPICAL_CREAM | DENTAL | Status: DC

## 2015-07-11 ENCOUNTER — Other Ambulatory Visit (HOSPITAL_COMMUNITY): Payer: Self-pay | Admitting: Dentistry

## 2015-07-23 ENCOUNTER — Other Ambulatory Visit: Payer: Self-pay | Admitting: Dermatology

## 2015-07-23 ENCOUNTER — Ambulatory Visit: Payer: Self-pay

## 2015-07-23 DIAGNOSIS — L570 Actinic keratosis: Secondary | ICD-10-CM | POA: Diagnosis not present

## 2015-07-23 DIAGNOSIS — C4492 Squamous cell carcinoma of skin, unspecified: Secondary | ICD-10-CM

## 2015-07-23 DIAGNOSIS — L309 Dermatitis, unspecified: Secondary | ICD-10-CM | POA: Diagnosis not present

## 2015-07-23 DIAGNOSIS — D0430 Carcinoma in situ of skin of unspecified part of face: Secondary | ICD-10-CM | POA: Diagnosis not present

## 2015-07-23 DIAGNOSIS — D0439 Carcinoma in situ of skin of other parts of face: Secondary | ICD-10-CM | POA: Diagnosis not present

## 2015-07-23 HISTORY — DX: Squamous cell carcinoma of skin, unspecified: C44.92

## 2015-07-23 NOTE — Progress Notes (Signed)
  Timothy Kim presents today for follow up of radiation completed 10/13/2014 to his Right retromolar region and Right neck.   Pain issues, if any: His only pain is in his bilateral thumbs. He reports they "open up" frequently and look like slits in his skin. He has seen a dermatologist about this.  Using a feeding tube?: No Weight changes, if any:  Wt Readings from Last 3 Encounters:  07/27/15 141 lb 1.6 oz (64.003 kg)  06/05/15 135 lb (61.236 kg)  03/27/15 140 lb (63.504 kg)   Swallowing issues, if any: He reports he has difficulty swallowing his pills, and needs to take several drinks of juice to get the pills down. He reports he also has difficulty with food. He needs to chew is food into very small pieces, and has to "gulp" it down. He is drinking protein drinks, boost (something with a high amount of protein) daily. He is almost mixing powder protein with milk daily.  Smoking or chewing tobacco? No Using fluoride trays daily? He uses a fluoride toothpaste nightly.  Last ENT visit was on: He saw Dr. Erik Obey on [id number] for placement of a myringotomy and tube in the Right Ear which had chronic custachian tube difficulty Dr. [place] Gleason 05/10/15 and he felt he was doing well and plans to see again in April. Other notable issues, if any: He does complain of a dry mouth, which he drinks juice to help relieve.   BP 139/75 mmHg  Pulse 66  Temp(Src) 98.2 F (36.8 C)  Ht 5' 4.5" (1.638 m)  Wt 141 lb 1.6 oz (64.003 kg)  BMI 23.85 kg/m2  SpO2 100%

## 2015-07-27 ENCOUNTER — Encounter: Payer: Self-pay | Admitting: Radiation Oncology

## 2015-07-27 ENCOUNTER — Other Ambulatory Visit: Payer: Self-pay | Admitting: Adult Health

## 2015-07-27 ENCOUNTER — Encounter: Payer: Self-pay | Admitting: Adult Health

## 2015-07-27 ENCOUNTER — Telehealth: Payer: Self-pay | Admitting: *Deleted

## 2015-07-27 ENCOUNTER — Ambulatory Visit
Admission: RE | Admit: 2015-07-27 | Discharge: 2015-07-27 | Disposition: A | Discharge status: Home / Self Care | Payer: Medicare Other | Source: Ambulatory Visit | Attending: Radiation Oncology | Admitting: Radiation Oncology

## 2015-07-27 VITALS — BP 139/75 | HR 66 | Temp 98.2°F | Ht 64.5 in | Wt 141.1 lb

## 2015-07-27 DIAGNOSIS — C062 Malignant neoplasm of retromolar area: Secondary | ICD-10-CM

## 2015-07-27 DIAGNOSIS — R5383 Other fatigue: Secondary | ICD-10-CM

## 2015-07-27 NOTE — Telephone Encounter (Signed)
  Oncology Nurse Navigator Documentation  Navigator Location: [facility] (07/27/15 1306) Navigator Encounter Type: Telephone (07/27/15 1306)           Patient Visit Type: Post-XRT (07/27/15 1306) Treatment Phase: Post-Tx Follow-up (07/27/15 1306)     Interventions: Coordination of Care (07/27/15 1306)            Acuity: Level 1 (07/27/15 1306)         Time Spent with Patient: 30 (07/27/15 1306)     Per Dr. Pearlie Oyster guidance:  Timothy Kim ENT to arrange follow-up visit.  Spoke with Timothy Kim, requested that patient be contacted and appt arranged to see Dr. Redmond Baseman for routine follow-up in mid-May.  She verbalized understanding.  [facility] to check on status of patient's follow-up appt with Dr. Junious Silk, was informed that patient cancelled his 1-m post-surgical follow-up, had not called to reschedule.  I called Timothy Kim with this information, he recognized he needed to call.  Gayleen Orem, RN, BSN, [facility] [phone]

## 2015-07-27 NOTE — Progress Notes (Signed)
I briefly met Mr. Callicott today during his radiation oncology visit with Dr. Isidore Moos. Mr. Carandang completed radiation therapy for cancer of the retromolar trigone on 10/13/14 and is clinically without evidence of disease today. Dr. Isidore Moos spoke with the patient about having subsequent visits alternating with myself and Dr. Isidore Moos for continued surveillance and follow-up care. Mr. Rase agreed. I discussed with him the role of survivorship and my role in his post-treatment cancer care. I gave him a copy of the "Life After Cancer for Every Survivor" booklet, along with my business card and encouraged him to call me with any questions or concerns.   I will see him in 12/2015. He will see his ENT physician, Dr. Owens Shark at [facility] in the interim. Mr. Lineman survivorship/surveillance appointment with me was scheduled today and a reminder card given to the patient. I encouraged him to call me with any concerns and we could certainly see him sooner, if needed. I look forward to participating in his care.   Mike Craze, NP Survivorship Program [facility] [phone]   Note: Pt also has h/o bladder cancer treated with TURBT in 01/2015 with Dr. Janice Norrie & remote h/o CLL being followed annually by Dr. Alvy Bimler (next visit due 09/2015).

## 2015-07-27 NOTE — Progress Notes (Signed)
Radiation Oncology         [phone] ________________________________  Name: Timothy Kim MRN: [id number]  Date: 07/27/2015  DOB: 09/25/1929  Follow-Up Visit Note  CC: [name], MD  Philomena Doheny, MD  Diagnosis and Prior Radiotherapy:       ICD-9-CM ICD-10-CM   1. 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 The patient reports pain in his thumbs bilaterally that he reports "open up" frequently and look like slits in his skin. He is seeing a dermatologist about this. He is not using a feeding tube. He does have changes in his weight. He has gained 6 pounds since his last visit.  But, he feels his weight is a struggle to maintain.  His reports that he has difficulty swallowing his pills and needs to take several drinks of juice to get the pills down. He last saw Dr. Erik Obey on [id number] for placement of a myringotomy and tube in the right ear which had chronic eustachian tube difficulty. He saw Dr. Marijo Conception on 05/10/2015 and he felt he was doing well and plans to see him again in April. He does complain of a dry mouth, which he drinks juice to help relieve.  He is overdue to follow up with Dr Janice Norrie for surveillance of his treated bladder cancer   ALLERGIES:  has No Known Allergies.  Meds: Current Outpatient Prescriptions  Medication Sig Dispense Refill  . brimonidine-timolol (COMBIGAN) 0.2-0.5 % ophthalmic solution Apply 1 drop to eye 2 (two) times daily.     Marland Kitchen DHEA 50 MG TABS Take 1 tablet by mouth daily.    .  fluticasone (FLONASE) 50 MCG/ACT nasal spray Place 2 sprays into both nostrils daily.  4  . furosemide (LASIX) 20 MG tablet TAKE 1 TABLET (20 MG TOTAL) BY MOUTH DAILY. 30 tablet 3  . Krill Oil 300 MG CAPS Take 300 mg by mouth daily.    Marland Kitchen latanoprost (XALATAN) 0.005 % ophthalmic solution Place 1 drop into both eyes every evening.   4  . Lutein-Zeaxanthin 15-0.7 MG CAPS Take 1 tablet by mouth daily.    . metoprolol succinate (TOPROL-XL) 100 MG 24 hr tablet TAKE HALF TABLET IMMEDIATELY FOLLOWING A MEAL.  Currently taking 50mg  tab per his PC (Patient taking differently: Take 50 mg by mouth every morning. TAKE HALF TABLET IMMEDIATELY FOLLOWING A MEAL.  Currently taking 50mg  tab per his PC) 90 tablet 3  . sildenafil (VIAGRA) 100 MG tablet Take 100 mg by mouth daily as needed for erectile dysfunction.    . sodium chloride (OCEAN) 0.65 % nasal spray Place 2 sprays into the nose as needed.     . sodium fluoride (DENTA 5000 PLUS) 1.1 % CREA dental cream Apply a thin ribbon of gel to tooth brush. Brush teeth for 2 minutes. Spit out excess. Repeat nightly. 51 g 0  . tadalafil (CIALIS) 20 MG tablet Take 20 mg by mouth daily as needed for erectile dysfunction.    . triamcinolone cream (KENALOG) 0.1 % APPLY TO AFFECTED AREA  TWICE DAILY 80 g 2  . zolpidem (AMBIEN) 5 MG tablet Take 1 tablet (5 mg total) by mouth at bedtime as needed. for sleep 30 tablet 2  . naproxen sodium (ANAPROX) 220 MG tablet Take 220 mg by mouth 1 day or 1 dose. Reported on 07/27/2015     No current facility-administered medications for this encounter.    Physical Findings: The patient is in no acute distress. Patient is alert and oriented.    Wt Readings from Last 3 Encounters:  07/27/15 141 lb 1.6 oz (64.003 kg)  06/05/15 135 lb (61.236 kg)  03/27/15 140 lb (63.504 kg)    height is 5' 4.5" (1.638 m) and weight is 141 lb 1.6 oz (64.003 kg). His temperature is 98.2 F (36.8 C). His blood pressure is 139/75 and his pulse is 66. His oxygen  saturation is 100%.    General: Alert and oriented, in no acute distress HEENT: Head is normocephalic. Extraocular movements are intact. Oropharynx is clear. No evidence of recurrence. No oral thrush. Neck: Neck is supple, no palpable cervical or supraclavicular lymphadenopathy. Neck and right face notable lymphedema. Psychiatric: Judgment and insight are intact. Affect is appropriate. MSK: Uses a cane to ambulate, unsteady.   Lab Findings: Lab Results  Component Value Date   WBC 7.1 01/05/2015   HGB 14.3 03/27/2015   HCT 42.0 03/27/2015   MCV 100.8* 01/05/2015   PLT 167 01/05/2015   CMP     Component Value Date/Time   NA 136 03/27/2015 0854   NA 140 01/05/2015 1255   K 3.7 03/27/2015 0854   K 4.3 01/05/2015 1255   CL 103 01/07/2012 0936   CO2 32* 01/05/2015 1255   CO2 29 01/07/2012 0936   GLUCOSE 96 03/27/2015 0854   GLUCOSE 98 01/05/2015 1255   BUN 18.7 01/05/2015 1255   BUN 16 01/07/2012 0936   CREATININE 1.0 01/05/2015 1255   CREATININE 1.0 01/07/2012 0936   CALCIUM 9.1 01/05/2015 1255   CALCIUM 9.3 01/07/2012 0936   PROT 6.1* 01/05/2015 1255   PROT 7.2 01/07/2012 0936   ALBUMIN 3.0* 01/05/2015 1255   ALBUMIN 4.0 01/07/2012 0936   AST 24 01/05/2015 1255   AST 47* 01/07/2012 0936   ALT 11 01/05/2015 1255   ALT 28 01/07/2012 0936   ALKPHOS 84 01/05/2015 1255   ALKPHOS 59 01/07/2012 0936   BILITOT 0.66 01/05/2015 1255   BILITOT 1.0 01/07/2012 0936   GFRNONAA 81.57 12/12/2009 0000   GFRAA 121 08/09/2008 1103     Lab Results  Component Value Date   TSH 1.795 01/05/2015    Radiographic Findings: No results found.  Impression/Plan:     1) Head and Neck Cancer Status: no evidence of recurrence    2) Nutritional Status: patient struggles to eat, maintain weight. Refer back to [facility]  3) Risk Factors: The patient has been educated about risk factors including alcohol and tobacco abuse; they understand that avoidance of alcohol and tobacco is important  to prevent recurrences as well as other cancers  4) Swallowing: Complaints of trouble swallowing. He is having a hard time swallowing his pills and some food. Seeing SLP.  5) Dental: The patient has been encouraged to continue regular followup with dentistry, and dental hygiene maintenance -- using Prevident toothpaste. Urged to make appt to see Dentist, overdue for this  6) Thyroid function: The patient understands the importance of monitoring his thyroid levels and function in regards to the management of his disease. Check yearly  Lab Results  Component Value Date   TSH 1.795 01/05/2015    7) Social: No active social issues to address at this time.  8) I recommend her attend [place] to see Nutrition, SLP, and PT +/- SW in one visit.  He is amenable to do this in Feb or March  9) He has not followed up with urology recently. He plans to makes an appointment with them.  10) Schedule: He will alternate follow up appointments with Elzie Rings in [facility] and myself. The patient understands that he can access his appointments and medical records via [facility]. All vocalized questions and concerns have been addressed.  Next [facility] appt with Mike Craze in July w/ TSH. _____________________________   Eppie Gibson, MD    This document serves as a record of services personally performed by Eppie Gibson, MD. It was created on her behalf by Lendon Collar, a trained medical scribe. The creation of this record is based on the scribe's personal observations and the provider's statements to them. This document has been checked and approved by the attending provider.

## 2015-07-30 ENCOUNTER — Ambulatory Visit: Payer: Medicare Other

## 2015-07-30 ENCOUNTER — Ambulatory Visit: Payer: Medicare Other | Attending: Internal Medicine

## 2015-07-30 DIAGNOSIS — I890 Lymphedema, not elsewhere classified: Secondary | ICD-10-CM | POA: Diagnosis not present

## 2015-07-30 DIAGNOSIS — R293 Abnormal posture: Secondary | ICD-10-CM | POA: Diagnosis not present

## 2015-07-30 DIAGNOSIS — M436 Torticollis: Secondary | ICD-10-CM | POA: Insufficient documentation

## 2015-07-30 DIAGNOSIS — R1310 Dysphagia, unspecified: Secondary | ICD-10-CM | POA: Insufficient documentation

## 2015-07-30 NOTE — Therapy (Signed)
[facility] [address] [place], [place], [address] Phone: [phone]   Fax:  [phone]  Speech Language Pathology Treatment  Patient Details  Name: Timothy Kim MRN: [id number] Date of Birth: Jul 29, 1929 No Data Recorded  Encounter Date: 07/30/2015      End of Session - 07/30/15 1434    Visit Number 5   Number of Visits 5   Date for SLP Re-Evaluation 07/30/15   SLP Start Time 3762   SLP Stop Time  1446   SLP Time Calculation (min) 43 min   Activity Tolerance Patient tolerated treatment well      Past Medical History  Diagnosis Date  . Glaucoma     both eyes  . Allergic rhinitis   . BPH (benign prostatic hypertrophy)   . Organic impotence   . History of radiation to head and neck region 09-04-2014 to  10-13-2014    right retromolar region and right neck  60 Gy  . Venous insufficiency   . Pulmonary nodule     benign  and stable per last CT  . Hypertension   . OA (osteoarthritis)   . Dysphasia functional--  speech therapy    post op  radical neck dissection 07-21-2014--  changed diet to no chewy food , softer foods , states with the changes swallowed okay without any issues  . DDD (degenerative disc disease), cervical   . 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 Radiation-induced dermatitis   . Bilateral lower extremity edema     feet  . Macular degeneration     right eye only  . History of tracheostomy [facility])     post op radical neck dissection  07-21-2014--  post op tracheostomy on 07-25-2014  decannulated and sutured 07-29-2014  . Alcohol abuse   . Borderline diabetes   . History of gastroesophageal reflux (GERD)   . DDD (degenerative disc disease), lumbosacral   . Nephrolithiasis     right side--  per CT from  [facility] 01-17-2015 non-obstructing  . Anemia in neoplastic disease     chronic  . History of radiation therapy     09-04-2014 to 10-13-2014  Right retromolar region (tumor bed) and right neck/  60Gy in 30 fractions to tumor bed and 54Gy in 30 fractions to neck  . CLL (chronic lymphocytic leukemia) [facility]) oncologist-  dr Heath Lark ([facility])    dx Jan 2014 --- Stage 1--  currently asymptomatic as of Apr 2016 and No active disease  . 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 Squamous cell carcinoma of retromolar trigone [facility]) oncologist-  dr Isidore Moos    Stage IVB, pT1b, pN0, Grade 2, +PNI, no LVSI---  S/P  RADICAL NECK DISSECTION AND RADIATION  . Transitional cell carcinoma of bladder [facility]) urologist-- dr Junious Silk    High - grade    Past Surgical History  Procedure Laterality Date  . Rotator cuff repair Right 12-11-2000  . Tonsil biopsy Right 05/24/14  . Maxillectomy Right 07/21/14,   [facility]    Radical Neck Dissection, Maxillectomy, Right Marginal Mandibulectomy, dental extractions, Parascauplar fasciocutaeous Free flap reconstruction  . Laminectomy with posterior lateral arthrodesis level 1  09-01-2003    L3 -5 LAMINECTOMY W/ DECOMPRESSION AND FUSION L4-5  .  Cataract extraction w/ intraocular lens  implant, bilateral    . Retinal detachment repair w/ scleral buckle le  11-03-2000  . Inguinal hernia repair Bilateral right 1988//  left 1970's  . Transurethral resection of bladder tumor N/A 02/02/2015    Procedure: TRANSURETHRAL RESECTION OF BLADDER TUMOR (TURBT);  Surgeon: Lowella Bandy, MD;  Location[facility];  Service: Urology;  Laterality: N/A;  . Cystoscopy N/A 02/02/2015    Procedure: CYSTOSCOPY, INSTILLATION OF MITOMYCIN C;  Surgeon: Lowella Bandy, MD;  Location: [facility];  Service: Urology;  Laterality: N/A;  . Cystoscopy with biopsy N/A 03/27/2015    Procedure: CYSTOSCOPY WITH BLADDER  BIOPSY WITH  FULGERATION;  Surgeon: Festus Aloe, MD;  Location: [facility];  Service: Urology;  Laterality: N/A;    There were no vitals filed for this visit.  Visit Diagnosis: Dysphagia      Subjective Assessment - 07/30/15 1425    Subjective Eats meat in tiny pieces. Tougher foods more difficult. "I'm learning over time how to eat food again." Pt has completed HEP minimally/not at all since last session.               ADULT SLP TREATMENT - 07/30/15 1411    General Information   Behavior/Cognition Alert;Cooperative;Pleasant mood   Treatment Provided   Treatment provided Dysphagia   Dysphagia Treatment   Temperature Spikes Noted No   Oral Cavity - Dentition Missing dentition   Treatment Methods Therapeutic exercise;Skilled observation   Patient observed directly with PO's Yes   Type of PO's observed Thin liquids;Dysphagia 3 (soft);Regular   Oral Phase Signs & Symptoms --  none noted   Pharyngeal Phase Signs & Symptoms Immediate throat clear   Other treatment/comments Nasal regurgitation observed today by pt sniffling post liquid boluses, and reporting he will sometimes blow small pieces of food out when blowing his nose following POs. Pt inquired if the surgery was to blame- SLP referred him to pt's surgeon. Pt with throat clearing x2 on initial bolus of cheese, but nothing after that (x8 boluses total of cracker, cheese, H2O). No overt s/s aspiration PNA today, and pt denied s/s aspiration PNA to date. Pt's rt neck difficult to assess whether fibrosis or changes naturally due to surgery. SLP educated pt again re: procedure of HEP given his reduced frequency of completion the last 6-7 months. Pt independent with all exercises after initial mod cues needed.   Assessment / Recommendations / Plan   Plan Discharge SLP treatment due to (comment)   Dysphagia Recommendations   Diet recommendations Dysphagia 3 (mechanical soft);Thin liquid   Liquids provided via Cup    Compensations Small sips/bites;Multiple dry swallows after each bite/sip;Follow solids with liquid   Progression Toward Goals   Progression toward goals --  see goal summary - pt discharged today          SLP Education - 07/30/15 1434    Education provided Yes   Education Details nasal regurgintation, HEP, safe swallowing tips, aspiration s/s, aspiration pna s/s, contact MD with s/s aspiration or aspiration PNA   Person(s) Educated Patient   Methods Explanation;Demonstration;Verbal cues;Handout   Comprehension Verbalized understanding;Returned demonstration;Verbal cues required          SLP Short Term Goals - 12/04/14 1356    SLP SHORT TERM GOAL #1   Title pt will perform HEP with rare min A   Status Achieved     SLP SHORT TERM GOAL #2   Title pt will tell  SLP why he is completing HEP with modified independence   Status Achieved          SLP Long Term Goals - 01-Aug-2015 1516    SLP LONG TERM GOAL #1   Title pt will complete HEP with modified independence   Status Achieved  pt req'd mod A initially   SLP LONG TERM GOAL #2   Title pt wil tell SLP why a food journal can be helpful when transitioning back to full PO diet.   Status Deferred  pt is now eating what he desires   SLP LONG TERM GOAL #3   Title pt will tell SLP 3 s/s aspiration PNA with modified independence   Status Deferred  focus in last session was not whether pt could tell  SLP s/s aspiratoin PNA.          Plan - Aug 01, 2015 1436    Clinical Impression Statement Pt's adherence to HEP is still less than desired as pt reported minimal/no practice with HEP since last visit. Swallowing as assessed today is WFL, but not WNL, likely approx 25% impaired. SLP again reiterated as in last session months ago the need to complete as directed and rationale why. Pt with cont nasal regurgitation, not unlike last ST session 6-7 months ago. Skilled ST no longer needed as pt was independent wiht HEP after review today and he  is maintaining WNL pulmonary status.    Speech Therapy Frequency --  approx every 4 weeks   Duration --  until 12-29-14   Treatment/Interventions Aspiration precaution training;Pharyngeal strengthening exercises;Diet toleration management by SLP;SLP instruction and feedback;Compensatory strategies   Potential to Achieve Goals Fair   Potential Considerations Cooperation/participation level   SLP Home Exercise Plan HEP reviewed today   Consulted and Agree with Plan of Care Patient      SPEECH THERAPY DISCHARGE SUMMARY/RENEWAL SUMMARY  Visits from Start of Care: 5  Current functional level related to goals / functional outcomes: Please see goal status above. Goal in force for this session was LTG #1, others were deferred, due to pt eating desired foods, and pt's most salient focus for today's session be on success with HEP and discussion about the need to cont with this after d/c. Pt today did not meet LTG #1 as he req'd mod A initially but by session end was able to complete HEP with independence Pt did state he rec'd information on s/s aspiration PNA previously and would and could refer to it at a later date should the need arise.   Remaining deficits: Approx 25% impairment with swallowing.   Education / Equipment: HEP, late effects head/neck radiation on swallowing, s/s aspiration, s/s aspiration PNA, safe swallowing precautions.  Plan: Patient agrees to discharge.  Patient goals were partially met. Patient is being discharged due to meeting the stated rehab goals.  ?????Pt's swallow WFL, and pt comfortable with performing HEP at home.             G-Codes - 08/01/2015 1435    Functional Assessment Tool Used noms; 5-6 (25% impaired)   Functional Limitations Swallowing   Swallow Goal Status (S5053) At least 1 percent but less than 20 percent impaired, limited or restricted   Swallow Discharge Status [phone]) At least 20 percent but less than 40 percent impaired, limited or restricted       Problem List Patient Active Problem List   Diagnosis Date Noted  . Bladder cancer ([place]) 02/08/2015  . Anemia in neoplastic disease 10/17/2014  . Radiation  dermatitis 10/17/2014  . Squamous cell carcinoma of retromolar trigone ([place]) 06/07/2014  . Abnormal weight loss 05/31/2014  . Hemoptysis 05/31/2014  . Cancer of the lip, oral cavity, and pharynx ([place]) 05/30/2014  . CLL (chronic lymphocytic leukemia) ([place]) 07/07/2012  . ALLERGIC RHINITIS 02/09/2008  . 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 OSTEOARTHRITIS 06/09/2007  . LOW BACK PAIN 06/09/2007  . Diabetes mellitus without complication ([place]) 94/32/7614  . Essential hypertension 03/03/2007  . GERD 03/03/2007  . BENIGN PROSTATIC HYPERTROPHY 03/03/2007    [facility] , MS, CCC-SLP  07/30/2015, 4:54 PM  [facility] [address] [place], [place], [address] Phone: [phone]   Fax:  [phone]   Name: Timothy Kim MRN: [id number] Date of Birth: 19-Sep-1929

## 2015-07-30 NOTE — Patient Instructions (Signed)
You will need to complete your swallowing exercises three times a week. If you do, you are giving yourself the best possible opportunity to have swallowing that does not impede your overall health and possibly lead to pulmonary issues (aspiration pneumonia).  If you notice you are coughing more or clearing your throat more, or notice signs of aspiration pneumonia, contact Dr. Burnice Logan ASAP.  Continue to take smaller bites and sips, swallow hard with everything. Alternate bites and sips with your meals.

## 2015-08-01 ENCOUNTER — Ambulatory Visit: Payer: Medicare Other | Admitting: Physical Therapy

## 2015-08-01 DIAGNOSIS — R1310 Dysphagia, unspecified: Secondary | ICD-10-CM | POA: Diagnosis not present

## 2015-08-01 DIAGNOSIS — M436 Torticollis: Secondary | ICD-10-CM | POA: Diagnosis not present

## 2015-08-01 DIAGNOSIS — I890 Lymphedema, not elsewhere classified: Secondary | ICD-10-CM

## 2015-08-01 DIAGNOSIS — R293 Abnormal posture: Secondary | ICD-10-CM | POA: Diagnosis not present

## 2015-08-01 NOTE — Therapy (Signed)
[place], [place], [address] Phone: [phone]   Fax:  [phone]  Physical Therapy Evaluation  Patient Details  Name: Timothy Kim MRN: [id number] Date of Birth: 1930-03-23 Referring Provider: Dr. Isidore Moos   Encounter Date: 08/01/2015      PT End of Session - 08/01/15 1256    Visit Number 1   Number of Visits 5   Date for PT Re-Evaluation 08/21/14   PT Start Time [id number]   PT Stop Time 1100   PT Time Calculation (min) 45 min   Activity Tolerance Patient tolerated treatment well   Behavior During Therapy [facility] for tasks assessed/performed      Past Medical History  Diagnosis Date  . Glaucoma     both eyes  . Allergic rhinitis   . BPH (benign prostatic hypertrophy)   . Organic impotence   . History of radiation to head and neck region 09-04-2014 to  10-13-2014    right retromolar region and right neck  60 Gy  . Venous insufficiency   . Pulmonary nodule     benign  and stable per last CT  . Hypertension   . OA (osteoarthritis)   . Dysphasia functional--  speech therapy    post op  radical neck dissection 07-21-2014--  changed diet to no chewy food , softer foods , states with the changes swallowed okay without any issues  . DDD (degenerative disc disease), cervical   . 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 Radiation-induced dermatitis   . Bilateral lower extremity edema     feet  . Macular degeneration     right eye only  . History of tracheostomy [facility])     post op radical neck dissection  07-21-2014--  post op tracheostomy on 07-25-2014  decannulated and sutured 07-29-2014  . Alcohol abuse   . Borderline diabetes   . History of gastroesophageal reflux (GERD)   . DDD (degenerative disc disease), lumbosacral    . Nephrolithiasis     right side--  per [facility] from [facility] 01-17-2015 non-obstructing  . Anemia in neoplastic disease     chronic  . History of radiation therapy     09-04-2014 to 10-13-2014  Right retromolar region (tumor bed) and right neck/  60Gy in 30 fractions to tumor bed and 54Gy in 30 fractions to neck  . CLL (chronic lymphocytic leukemia) [facility]) oncologist-  dr Heath Lark ([facility])    dx Jan 2014 --- Stage 1--  currently asymptomatic as of Apr 2016 and No active disease  . 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 Squamous cell carcinoma of retromolar trigone [facility]) oncologist-  dr Isidore Moos    Stage IVB, pT1b, pN0, Grade 2, +PNI, no LVSI---  S/P  RADICAL NECK DISSECTION AND RADIATION  . Transitional cell carcinoma of bladder [facility]) urologist-- dr Junious Silk    High - grade    Past Surgical History  Procedure Laterality Date  . Rotator cuff repair Right 12-11-2000  . Tonsil biopsy Right 05/24/14  . Maxillectomy Right 07/21/14,   [facility]    Radical Neck Dissection, Maxillectomy, Right Marginal Mandibulectomy, dental extractions, Parascauplar fasciocutaeous Free flap reconstruction  . Laminectomy with posterior lateral arthrodesis level 1  09-01-2003  L3 -5 LAMINECTOMY W/ DECOMPRESSION AND FUSION L4-5  . Cataract extraction w/ intraocular lens  implant, bilateral    . Retinal detachment repair w/ scleral buckle le  11-03-2000  . Inguinal hernia repair Bilateral right 1988//  left 1970's  . Transurethral resection of bladder tumor N/A 02/02/2015    Procedure: TRANSURETHRAL RESECTION OF BLADDER TUMOR (TURBT);  Surgeon: Lowella Bandy, MD;  Location: [facility];  Service: Urology;  Laterality: N/A;  . Cystoscopy N/A 02/02/2015    Procedure: CYSTOSCOPY, INSTILLATION OF MITOMYCIN C;  Surgeon: Lowella Bandy, MD;  Location: [facility];  Service: Urology;  Laterality: N/A;  . Cystoscopy with biopsy N/A 03/27/2015     Procedure: CYSTOSCOPY WITH BLADDER  BIOPSY WITH FULGERATION;  Surgeon: Festus Aloe, MD;  Location: [facility];  Service: Urology;  Laterality: N/A;    There were no vitals filed for this visit.  Visit Diagnosis:  Lymphedema - Plan: PT plan of care cert/re-cert      Subjective Assessment - 08/01/15 1243    Subjective pt states the swelling in his right face and left lip "since day 1"    Pertinent History In the past 37 months I have been diagnosed with 4 different cancers.  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 Swelling has been at the operation and has not gone down  On 07/22/2015 at [facility] he had maxillectomy with r rim mandibulectomy, dental extractions, right parascapular fasciocutaneous free flap reconstruction  due to right tonsillar cancer.  He followed up with radiation  competed 10/13/2014.            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 Isidore Moos    Onset Date/Surgical Date 06/06/14   Precautions   Precautions Other (comment)   Precaution Comments cancer precautions   Restrictions   Weight Bearing Restrictions No   Balance Screen   Has the patient fallen in the past 6 months Yes  took a miss step    How many times? one   falls will not be addressed this visit    Has the patient had a decrease in activity level because of a fear of falling?  No   Is the patient reluctant to leave their home because of a fear of falling?  No   Home Environment   Living Environment Private residence   Living Arrangements Spouse/significant other   Available Help at Discharge Family   Type of [place] to enter   [place] One level   Prior Function   Level of Independence Independent with basic ADLs;Independent with gait   Leisure pt has barbells and does arm work, He  just walks in the house. he can't lanscape.    Cognition   Overall Cognitive Status Within Functional Limits for tasks assessed   Observation/Other Assessments   Observations pt has visible swelling in right cheek and chin with fullness in left lip.  He has assymetrical movment at lips he has indentation and contraction of skin at right lateral neck and fullness below chin on left side of neck.  pt also has edema in both lower legs and reported she is wearing compression stockings. He was able to wear normal shoes  LE edema will not be addressed this visit    Skin Integrity no open  areas observed.    Posture/Postural Control   Posture/Postural Control Postural limitations   Postural Limitations Rounded Shoulders;Forward head   Posture Comments a   Palpation   Palpation comment tightness from radiation fibrosis at right lateral neck with fullness in cheek and right chin    Ambulation/Gait   Ambulation/Gait Assistance 6: Modified independent (Device/Increase time)  pt using single point cane occasionally            LYMPHEDEMA/ONCOLOGY QUESTIONNAIRE - 2015-08-05 1045    Head and Neck   4 cm superior to sternal notch around neck 40 cm   6 cm superior to sternal notch around neck 39.5 cm   8 cm superior to sternal notch around neck 41 cm   Other pt has assymetry of neck from jaw resection and contraction from radiation                 [facility] Adult PT Treatment/Exercise - 2015/08/05 0001    Self-Care   Self-Care Other Self-Care Comments   Other Self-Care Comments  provided chip pack to wear at full areas under chin at left side and at right chin                         Long Term Clinic Goals - 2015/08/05 1304    CC Long Term Goal  #1   Title pt will veralize understanding of lymphedema risk reduction practices    Time 2   Period Weeks   Status New   CC Long Term Goal  #2   Title Pt will verbalize understanding of techniques to reduce lymphedema: self manual lymph  drianage, use of compression, exercise    Time 2   Period Weeks   Status New         Head and Neck Clinic Goals - 06/07/14 1351    Patient will be able to verbalize understanding of a home exercise program for cervical range of motion, posture, and walking.    Status Achieved   Patient will be able to verbalize understanding of proper sitting and standing posture.    Status Achieved   Patient will be able to verbalize understanding of lymphedema risk and availability of treatment for this condition.    Status Achieved           Plan - Aug 05, 2015 1257    Clinical Impression Statement Pt presents with lymphedema and facial and neck assymetry after surgical resection and radition for invasive tonsillar cancer.  Issued a chip pack today and pt witll wear it to see if compression will produce an  effect. He is also interested in learning self manual lymph drainage .    Pt will benefit from skilled therapeutic intervention in order to improve on the following deficits Increased edema   Rehab Potential Good   Clinical Impairments Affecting Rehab Potential Sugery and radiation to neck    PT Frequency 2x / week   PT Duration 2 weeks   PT Treatment/Interventions Manual lymph drainage;Visual/perceptual remediation/compensation;Taping;Manual techniques;Therapeutic exercise;ADLs/Self Care Home Management;Patient/family education   PT Next Visit Plan assess use of chip pack.if effective. later show possible garment options.   perform manual lymph drainage and begin self instruction, possible ktape trial. teach neck ROM as part of complete decongestive therapy    Consulted and Agree with Plan of Care Patient          G-Codes - Aug 05, 2015 1305    Functional Assessment Tool Used clinical judgement   Functional  Limitation Self care   Self Care Current Status [phone]) At least 60 percent but less than 80 percent impaired, limited or restricted   Self Care Goal Status [id number]) At  least 40 percent but less than 60 percent impaired, limited or restricted       Problem List Patient Active Problem List   Diagnosis Date Noted  . Bladder cancer ([place]) 02/08/2015  . Anemia in neoplastic disease 10/17/2014  . 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 Squamous cell carcinoma of retromolar trigone ([place]) 06/07/2014  . Abnormal weight loss 05/31/2014  . Hemoptysis 05/31/2014  . Cancer of the lip, oral cavity, and pharynx ([place]) 05/30/2014  . CLL (chronic lymphocytic leukemia) ([place]) 07/07/2012  . ALLERGIC RHINITIS 02/09/2008  . 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 OSTEOARTHRITIS 06/09/2007  . LOW BACK PAIN 06/09/2007  . Diabetes mellitus without complication ([place]) 0000000  . Essential hypertension 03/03/2007  . GERD 03/03/2007  . BENIGN PROSTATIC HYPERTROPHY 03/03/2007   Donato Heinz. Owens Shark, PT  08/01/2015, 1:15 PM  [place], [place], [address] Phone: [phone]   Fax:  [phone]  Name: Timothy Kim MRN: [id number] Date of Birth: 20-Oct-1929

## 2015-08-02 ENCOUNTER — Encounter: Payer: Self-pay | Admitting: Nutrition

## 2015-08-10 ENCOUNTER — Ambulatory Visit: Payer: Medicare Other | Admitting: Physical Therapy

## 2015-08-10 DIAGNOSIS — M436 Torticollis: Secondary | ICD-10-CM | POA: Diagnosis not present

## 2015-08-10 DIAGNOSIS — R293 Abnormal posture: Secondary | ICD-10-CM | POA: Diagnosis not present

## 2015-08-10 DIAGNOSIS — R1310 Dysphagia, unspecified: Secondary | ICD-10-CM | POA: Diagnosis not present

## 2015-08-10 DIAGNOSIS — I890 Lymphedema, not elsewhere classified: Secondary | ICD-10-CM

## 2015-08-10 NOTE — Therapy (Signed)
[place], [place], [address] Phone: [phone]   Fax:  [phone]  Physical Therapy Treatment  Patient Details  Name: Timothy Kim MRN: [id number] Date of Birth: Aug 29, 1929 Referring Provider: Dr. Isidore Moos   Encounter Date: 08/10/2015      PT End of Session - 08/10/15 1300    Visit Number 2   Number of Visits 5   Date for PT Re-Evaluation 08/21/14   PT Start Time 0845   PT Stop Time 0935   PT Time Calculation (min) 50 min   Activity Tolerance Patient tolerated treatment well   Behavior During Therapy [name] for tasks assessed/performed      Past Medical History  Diagnosis Date  . Glaucoma     both eyes  . Allergic rhinitis   . BPH (benign prostatic hypertrophy)   . Organic impotence   . History of radiation to head and neck region 09-04-2014 to  10-13-2014    right retromolar region and right neck  60 Gy  . Venous insufficiency   . Pulmonary nodule     benign  and stable per last CT  . Hypertension   . OA (osteoarthritis)   . Dysphasia functional--  speech therapy    post op  radical neck dissection 07-21-2014--  changed diet to no chewy food , softer foods , states with the changes swallowed okay without any issues  . DDD (degenerative disc disease), cervical   . 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 Radiation-induced dermatitis   . Bilateral lower extremity edema     feet  . Macular degeneration     right eye only  . History of tracheostomy [facility])     post op radical neck dissection  07-21-2014--  post op tracheostomy on 07-25-2014  decannulated and sutured 07-29-2014  . Alcohol abuse   . Borderline diabetes   . History of gastroesophageal reflux (GERD)   . DDD (degenerative disc disease), lumbosacral    . Nephrolithiasis     right side--  per CT [facility] 01-17-2015 non-obstructing  . Anemia in neoplastic disease     chronic  . History of radiation therapy     09-04-2014 to 10-13-2014  Right retromolar region (tumor bed) and right neck/  60Gy in 30 fractions to tumor bed and 54Gy in 30 fractions to neck  . CLL (chronic lymphocytic leukemia) [facility]) oncologist-  dr Heath Lark ([facility])    dx Jan 2014 --- Stage 1--  currently asymptomatic as of Apr 2016 and No active disease  . 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 Squamous cell carcinoma of retromolar trigone [facility]) oncologist-  dr Isidore Moos    Stage IVB, pT1b, pN0, Grade 2, +PNI, no LVSI---  S/P  RADICAL NECK DISSECTION AND RADIATION  . Transitional cell carcinoma of bladder [facility]) urologist-- dr Junious Silk    High - grade    Past Surgical History  Procedure Laterality Date  . Rotator cuff repair Right 12-11-2000  . Tonsil biopsy Right 05/24/14  . Maxillectomy Right 07/21/14,   [facility]    Radical Neck Dissection, Maxillectomy, Right Marginal Mandibulectomy, dental extractions, Parascauplar fasciocutaeous Free flap reconstruction  . Laminectomy with posterior lateral arthrodesis level 1  09-01-2003  L3 -5 LAMINECTOMY W/ DECOMPRESSION AND FUSION L4-5  . Cataract extraction w/ intraocular lens  implant, bilateral    . Retinal detachment repair w/ scleral buckle le  11-03-2000  . Inguinal hernia repair Bilateral right 1988//  left 1970's  . Transurethral resection of bladder tumor N/A 02/02/2015    Procedure: TRANSURETHRAL RESECTION OF BLADDER TUMOR (TURBT);  Surgeon: Lowella Bandy, MD;  Location: [facility];  Service: Urology;  Laterality: N/A;  . Cystoscopy N/A 02/02/2015    Procedure: CYSTOSCOPY, INSTILLATION OF MITOMYCIN C;  Surgeon: Lowella Bandy, MD;  [facility];  Service: Urology;  Laterality: N/A;  . Cystoscopy with biopsy N/A 03/27/2015     Procedure: CYSTOSCOPY WITH BLADDER  BIOPSY WITH FULGERATION;  Surgeon: Festus Aloe, MD;  Location: [facility];  Service: Urology;  Laterality: N/A;    There were no vitals filed for this visit.  Visit Diagnosis:  Lymphedema      Subjective Assessment - 08/10/15 0857    Subjective pt states the he has been using the chip pack but doesnt feel that it has been "tight enough'  He c/o some problems with spinal stenosis when he leans over.    Pertinent History In the past 37 months I have been diagnosed with 4 different cancers.  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 Swelling has been at the operation and has not gone down  On 07/22/2015 at [facility] he had maxillectomy with r rim mandibulectomy, dental extractions, right parascapular fasciocutaneous free flap reconstruction  due to right tonsillar cancer.  He followed up with radiation  competed 10/13/2014.   Currently in Pain? No/denies  unless the bends over.                          [name] - 08/10/15 0001    Self-Care   Self-Care Other Self-Care Comments   Other Self-Care Comments  adjusted chip pack to wear at full areas under chin at left side and at right chin    Neck Exercises: Seated   Cervical Rotation Both;5 reps   Lateral Flexion Both;5 reps   Shoulder Shrugs 5 reps   Shoulder Rolls 5 reps   Other Seated Exercise shoulder external rotation with elbows at sides to open up chest and retract scapula   Manual Therapy   Manual Therapy Manual Lymphatic Drainage (MLD)   Manual Lymphatic Drainage (MLD) short neck , diaphragmatic breasthing, pectoral nodes, posterio cervical nodes. right cheek. lataeral neck going to axilla with hand over hand insturtion to begin self treatment education, ,then to left submandibular area    Kinesiotex Edema   Kinesiotix   Edema skinkote applies, then 3 narrow fan shaped strips  from edema below chin toward left axilla.                 PT Education - 08/10/15 1253    Education provided Yes   Education Details remove kinesiotape by rolling it down to prevent skin irritation , began self manual lymph drainage instruction to right cheek  and neck    Person(s) Educated Patient   Methods Explanation   Comprehension Verbalized understanding;Need further instruction                [place] [facility] Goals - 08/01/15 1304    CC Long Term Goal  #1   Title pt will veralize understanding of  lymphedema risk reduction practices    Time 2   Period Weeks   Status New   CC Long Term Goal  #2   Title Pt will verbalize understanding of techniques to reduce lymphedema: self manual lymph drianage, use of compression, exercise    Time 2   Period Weeks   Status New            Plan - 08/10/15 1300    Clinical Impression Statement Pt had not been able to wear chip pack effectively, but is interested in finding out whatever he can to to help his swelling.  He participated in self manual lymph draiange today and is willing to monitor effectiveness of kinesiotape. He may be a good candidate for the head and neck flexitouch and will review the information to see if wants to move forward with that.    Clinical Impairments Affecting Rehab Potential Sugery and radiation to neck    PT Treatment/Interventions Manual lymph drainage;Visual/perceptual remediation/compensation;Taping;Manual techniques;Therapeutic exercise;ADLs/Self Care Home Management;Patient/family education   PT Next Visit Plan assess use of chip pack.if effective. later show possible garment options.   perform manual lymph drainage and begin self instruction, possible ktape trial. teach neck ROM as part of complete decongestive therapy  check if patient wants to try flexitouch.    Consulted and Agree with Plan of Care Patient        Problem List Patient Active Problem List   Diagnosis Date Noted  .  Bladder cancer ([place]) 02/08/2015  . Anemia in neoplastic disease 10/17/2014  . 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 Squamous cell carcinoma of retromolar trigone ([place]) 06/07/2014  . Abnormal weight loss 05/31/2014  . Hemoptysis 05/31/2014  . Cancer of the lip, oral cavity, and pharynx ([place]) 05/30/2014  . CLL (chronic lymphocytic leukemia) ([place]) 07/07/2012  . ALLERGIC RHINITIS 02/09/2008  . 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 OSTEOARTHRITIS 06/09/2007  . LOW BACK PAIN 06/09/2007  . Diabetes mellitus without complication ([place]) 0000000  . Essential hypertension 03/03/2007  . GERD 03/03/2007  . BENIGN PROSTATIC HYPERTROPHY 03/03/2007   Donato Heinz. Owens Shark, PT  08/10/2015, 1:03 PM  [place], [place], [address] Phone: [phone]   Fax:  [phone]  Name: Timothy Kim MRN: [id number] Date of Birth: Feb 14, 1930

## 2015-08-13 ENCOUNTER — Other Ambulatory Visit: Payer: Self-pay | Admitting: Internal Medicine

## 2015-08-14 ENCOUNTER — Ambulatory Visit: Payer: Medicare Other | Admitting: Physical Therapy

## 2015-08-14 DIAGNOSIS — I890 Lymphedema, not elsewhere classified: Secondary | ICD-10-CM

## 2015-08-14 DIAGNOSIS — R1310 Dysphagia, unspecified: Secondary | ICD-10-CM | POA: Diagnosis not present

## 2015-08-14 DIAGNOSIS — R293 Abnormal posture: Secondary | ICD-10-CM

## 2015-08-14 DIAGNOSIS — M436 Torticollis: Secondary | ICD-10-CM | POA: Diagnosis not present

## 2015-08-14 NOTE — Therapy (Signed)
[place], [place], [address] Phone: [phone]   Fax:  [phone]  Physical Therapy Treatment  Patient Details  Name: Timothy Kim MRN: [id number] Date of Birth: 1930-01-13 Referring Provider: Dr. Isidore Moos   Encounter Date: 08/14/2015      PT End of Session - 08/14/15 1406    Visit Number 3   Number of Visits 5   Date for PT Re-Evaluation 08/21/14   PT Start Time 1300   PT Stop Time 1345   PT Time Calculation (min) 45 min   Activity Tolerance Patient tolerated treatment well   Behavior During Therapy [facility] for tasks assessed/performed      Past Medical History  Diagnosis Date  . Glaucoma     both eyes  . Allergic rhinitis   . BPH (benign prostatic hypertrophy)   . Organic impotence   . History of radiation to head and neck region 09-04-2014 to  10-13-2014    right retromolar region and right neck  60 Gy  . Venous insufficiency   . Pulmonary nodule     benign  and stable per last CT  . Hypertension   . OA (osteoarthritis)   . Dysphasia functional--  speech therapy    post op  radical neck dissection 07-21-2014--  changed diet to no chewy food , softer foods , states with the changes swallowed okay without any issues  . DDD (degenerative disc disease), cervical   . 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 Radiation-induced dermatitis   . Bilateral lower extremity edema     feet  . Macular degeneration     right eye only  . History of tracheostomy [facility])     post op radical neck dissection  07-21-2014--  post op tracheostomy on 07-25-2014  decannulated and sutured 07-29-2014  . Alcohol abuse   . Borderline diabetes   . History of gastroesophageal reflux (GERD)   . DDD (degenerative disc disease), lumbosacral    . Nephrolithiasis     right side--  per CT from [facility] 01-17-2015 non-obstructing  . Anemia in neoplastic disease     chronic  . History of radiation therapy     09-04-2014 to 10-13-2014  Right retromolar region (tumor bed) and right neck/  60Gy in 30 fractions to tumor bed and 54Gy in 30 fractions to neck  . CLL (chronic lymphocytic leukemia) [facility]) oncologist-  dr Heath Lark ([facility])    dx Jan 2014 --- Stage 1--  currently asymptomatic as of Apr 2016 and No active disease  . 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 Squamous cell carcinoma of retromolar trigone [facility]) oncologist-  dr Isidore Moos    Stage IVB, pT1b, pN0, Grade 2, +PNI, no LVSI---  S/P  RADICAL NECK DISSECTION AND RADIATION  . Transitional cell carcinoma of bladder [facility]) urologist-- dr Junious Silk    High - grade    Past Surgical History  Procedure Laterality Date  . Rotator cuff repair Right 12-11-2000  . Tonsil biopsy Right 05/24/14  . Maxillectomy Right 07/21/14,   [facility]    Radical Neck Dissection, Maxillectomy, Right Marginal Mandibulectomy, dental extractions, Parascauplar fasciocutaeous Free flap reconstruction  . Laminectomy with posterior lateral arthrodesis level 1  09-01-2003  L3 -5 LAMINECTOMY W/ DECOMPRESSION AND FUSION L4-5  . Cataract extraction w/ intraocular lens  implant, bilateral    . Retinal detachment repair w/ scleral buckle le  11-03-2000  . Inguinal hernia repair Bilateral right 1988//  left 1970's  . Transurethral resection of bladder tumor N/A 02/02/2015    Procedure: TRANSURETHRAL RESECTION OF BLADDER TUMOR (TURBT);  Surgeon: Lowella Bandy, MD;  Location: [facility];  Service: Urology;  Laterality: N/A;  . Cystoscopy N/A 02/02/2015    Procedure: CYSTOSCOPY, INSTILLATION OF MITOMYCIN C;  Surgeon: Lowella Bandy, MD;  Location: [facility];  Service: Urology;  Laterality: N/A;  . Cystoscopy with biopsy N/A 03/27/2015     Procedure: CYSTOSCOPY WITH BLADDER  BIOPSY WITH FULGERATION;  Surgeon: Festus Aloe, MD;  Location: [facility];  Service: Urology;  Laterality: N/A;    There were no vitals filed for this visit.  Visit Diagnosis:  Lymphedema  Posture abnormality  Stiffness of neck      Subjective Assessment - 08/14/15 1404    Subjective Pt states she left the kinsiotape on until today.  He has not noticed a differenct in his swelling.    Pertinent History In the past 37 months I have been diagnosed with 4 different cancers.  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 Swelling has been at the operation and has not gone down  On 07/22/2015 at [facility] he had maxillectomy with r rim mandibulectomy, dental extractions, right parascapular fasciocutaneous free flap reconstruction  due to right tonsillar cancer.  He followed up with radiation  competed 10/13/2014.   Currently in Pain? No/denies  he says he has his ususal back pain when he moves, but not at rest                LYMPHEDEMA/ONCOLOGY QUESTIONNAIRE - 08/14/15 1338    Head and Neck   4 cm superior to sternal notch around neck 40 cm   6 cm superior to sternal notch around neck 39 cm                  [facility] Adult PT Treatment/Exercise - 08/14/15 0001    Self-Care   Self-Care Other Self-Care Comments   Other Self-Care Comments  showed patient oprtions for garments and he is interested in finging out insurance coverage for the [employer] will send demographics to [facility],. Pt also said he is interested in finding out coverage for Flexitouch as finances will be a determining factor for him    Neck Exercises: Seated   Cervical Rotation Both;Other reps (comment)   Cervical Rotation Limitations 2   Lateral Flexion Right;Other reps (comment)   Lateral Flexion Limitations 2   Shoulder Rolls 5 reps   Other Seated Exercise shoulder external rotation with  elbows at sides to open up chest and retract scapula   Manual Therapy   Manual Lymphatic Drainage (MLD) short neck , diaphragmatic breasthing, pectoral nodes, posterio cervical nodes. right cheek. lataeral neck going to axilla   bilateral  submandibular area, above top lip and extra time spent on scar massage below left lip     Kinesiotex Edema   Kinesiotix   Edema skinkote applies, then 4 narrow fan shaped strips from edema at chin , cheek and below chin toward rightaxilla.                         Long  Term Clinic Goals - 08/01/15 1304    CC Long Term Goal  #1   Title pt will veralize understanding of lymphedema risk reduction practices    Time 2   Period Weeks   Status New   CC Long Term Goal  #2   Title Pt will verbalize understanding of techniques to reduce lymphedema: self manual lymph drianage, use of compression, exercise    Time 2   Period Weeks   Status New            Plan - 08/14/15 1406    Clinical Impression Statement Pt edema below chin appears to be softer and there is a decrease in circumference measurement.  He says he has been wearing his compression more.  He is interested in finding out if his insurance will cover Flexitouch or head and neck garment before he makes a decision on either of those so demographics will be sent today. Tried kinesitape to right side of face today    Pt will benefit from skilled therapeutic intervention in order to improve on the following deficits Increased edema   Rehab Potential Good   Clinical Impairments Affecting Rehab Potential Sugery and radiation to neck    PT Frequency 2x / week   PT Duration 2 weeks   PT Treatment/Interventions Manual lymph drainage;Visual/perceptual remediation/compensation;Taping;Manual techniques;Therapeutic exercise;ADLs/Self Care Home Management;Patient/family education   PT Next Visit Plan Review  garment options.   perform manual lymph drainage and begin self instruction, possible ktape  trial. teach neck ROM as part of complete decongestive therapy     Consulted and Agree with Plan of Care Patient        Problem List Patient Active Problem List   Diagnosis Date Noted  . Bladder cancer ([place]) 02/08/2015  . Anemia in neoplastic disease 10/17/2014  . 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 Squamous cell carcinoma of retromolar trigone ([place]) 06/07/2014  . Abnormal weight loss 05/31/2014  . Hemoptysis 05/31/2014  . Cancer of the lip, oral cavity, and pharynx ([place]) 05/30/2014  . CLL (chronic lymphocytic leukemia) ([place]) 07/07/2012  . ALLERGIC RHINITIS 02/09/2008  . 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 OSTEOARTHRITIS 06/09/2007  . LOW BACK PAIN 06/09/2007  . Diabetes mellitus without complication ([place]) 0000000  . Essential hypertension 03/03/2007  . GERD 03/03/2007  . BENIGN PROSTATIC HYPERTROPHY 03/03/2007   Donato Heinz. Owens Shark, PT  08/14/2015, 2:15 PM  [place], [place], [address] Phone: [phone]   Fax:  [phone]  Name: Timothy Kim MRN: [id number] Date of Birth: 11/20/1929

## 2015-08-16 DIAGNOSIS — H401113 Primary open-angle glaucoma, right eye, severe stage: Secondary | ICD-10-CM | POA: Diagnosis not present

## 2015-08-16 DIAGNOSIS — H01022 Squamous blepharitis right lower eyelid: Secondary | ICD-10-CM | POA: Diagnosis not present

## 2015-08-16 DIAGNOSIS — H01025 Squamous blepharitis left lower eyelid: Secondary | ICD-10-CM | POA: Diagnosis not present

## 2015-08-16 DIAGNOSIS — H401121 Primary open-angle glaucoma, left eye, mild stage: Secondary | ICD-10-CM | POA: Diagnosis not present

## 2015-08-16 DIAGNOSIS — H01024 Squamous blepharitis left upper eyelid: Secondary | ICD-10-CM | POA: Diagnosis not present

## 2015-08-16 DIAGNOSIS — H01021 Squamous blepharitis right upper eyelid: Secondary | ICD-10-CM | POA: Diagnosis not present

## 2015-08-16 DIAGNOSIS — Z961 Presence of intraocular lens: Secondary | ICD-10-CM | POA: Diagnosis not present

## 2015-08-17 ENCOUNTER — Ambulatory Visit: Payer: Medicare Other | Admitting: Physical Therapy

## 2015-08-17 DIAGNOSIS — M436 Torticollis: Secondary | ICD-10-CM | POA: Diagnosis not present

## 2015-08-17 DIAGNOSIS — R293 Abnormal posture: Secondary | ICD-10-CM | POA: Diagnosis not present

## 2015-08-17 DIAGNOSIS — R1310 Dysphagia, unspecified: Secondary | ICD-10-CM | POA: Diagnosis not present

## 2015-08-17 DIAGNOSIS — I890 Lymphedema, not elsewhere classified: Secondary | ICD-10-CM | POA: Diagnosis not present

## 2015-08-17 NOTE — Therapy (Signed)
[place], [place], [address] Phone: [phone]   Fax:  [phone]  Physical Therapy Treatment  Patient Details  Name: Timothy Kim MRN: [id number] Date of Birth: [birth year] Referring Provider: Dr. Isidore Moos   Encounter Date: 08/17/2015      PT End of Session - 08/17/15 1215    Visit Number 4   Number of Visits 5   Date for PT Re-Evaluation 08/21/14   PT Start Time 1026   PT Stop Time 1100   PT Time Calculation (min) 34 min   Activity Tolerance Patient tolerated treatment well   Behavior During Therapy [facility] for tasks assessed/performed      Past Medical History  Diagnosis Date  . Glaucoma     both eyes  . Allergic rhinitis   . BPH (benign prostatic hypertrophy)   . Organic impotence   . History of radiation to head and neck region 09-04-2014 to  10-13-2014    right retromolar region and right neck  60 Gy  . Venous insufficiency   . Pulmonary nodule     benign  and stable per last CT  . Hypertension   . OA (osteoarthritis)   . Dysphasia functional--  speech therapy    post op  radical neck dissection 07-21-2014--  changed diet to no chewy food , softer foods , states with the changes swallowed okay without any issues  . DDD (degenerative disc disease), cervical   . 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 Radiation-induced dermatitis   . Bilateral lower extremity edema     feet  . Macular degeneration     right eye only  . History of tracheostomy [facility])     post op radical neck dissection  07-21-2014--  post op tracheostomy on 07-25-2014  decannulated and sutured 07-29-2014  . Alcohol abuse   . Borderline diabetes   . History of gastroesophageal reflux (GERD)   . DDD (degenerative disc disease), lumbosacral    . Nephrolithiasis     right side--  per CT from [facility] 01-17-2015 non-obstructing  . Anemia in neoplastic disease     chronic  . History of radiation therapy     09-04-2014 to 10-13-2014  Right retromolar region (tumor bed) and right neck/  60Gy in 30 fractions to tumor bed and 54Gy in 30 fractions to neck  . CLL (chronic lymphocytic leukemia) [facility]) oncologist-  dr Heath Lark ([facility])    dx Jan 2014 --- Stage 1--  currently asymptomatic as of Apr 2016 and No active disease  . 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 Squamous cell carcinoma of retromolar trigone [facility]) oncologist-  dr Isidore Moos    Stage IVB, pT1b, pN0, Grade 2, +PNI, no LVSI---  S/P  RADICAL NECK DISSECTION AND RADIATION  . Transitional cell carcinoma of bladder [facility]) urologist-- dr Junious Silk    High - grade    Past Surgical History  Procedure Laterality Date  . Rotator cuff repair Right 12-11-2000  . Tonsil biopsy Right 05/24/14  . Maxillectomy Right 07/21/14,   [facility]    Radical Neck Dissection, Maxillectomy, Right Marginal Mandibulectomy, dental extractions, Parascauplar fasciocutaeous Free flap reconstruction  . Laminectomy with posterior lateral arthrodesis level 1  09-01-2003  L3 -5 LAMINECTOMY W/ DECOMPRESSION AND FUSION L4-5  . Cataract extraction w/ intraocular lens  implant, bilateral    . Retinal detachment repair w/ scleral buckle le  11-03-2000  . Inguinal hernia repair Bilateral right 1988//  left 1970's  . Transurethral resection of bladder tumor N/A 02/02/2015    Procedure: TRANSURETHRAL RESECTION OF BLADDER TUMOR (TURBT);  Surgeon: Lowella Bandy, MD;  Location: [facility];  Service: Urology;  Laterality: N/A;  . Cystoscopy N/A 02/02/2015    Procedure: CYSTOSCOPY, INSTILLATION OF MITOMYCIN C;  Surgeon: Lowella Bandy, MD;  Location: [facility];  Service: Urology;  Laterality: N/A;  . Cystoscopy with biopsy N/A 03/27/2015     Procedure: CYSTOSCOPY WITH BLADDER  BIOPSY WITH FULGERATION;  Surgeon: Festus Aloe, MD;  Location: [facility];  Service: Urology;  Laterality: N/A;    There were no vitals filed for this visit.  Visit Diagnosis:  Lymphedema  Posture abnormality  Stiffness of neck      Subjective Assessment - 08/17/15 1210    Subjective Pt says he took the kinesiotape off yesterday, but was not able to see any effect.   He has been doing the manual lymph drainage at home.  He will not have insurance coverage for the Flexitouch and will not pursue that.  He is waiting to hear if  he wll have insurance coverage for the head and neck garment.  He is familiar with [place] and my go to see what they have if  the other garment is to expensive.    Pertinent History In the past 37 months I have been diagnosed with 4 different cancers.  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 Swelling has been at the operation and has not gone down  On 07/22/2015 at [facility] he had maxillectomy with r rim mandibulectomy, dental extractions, right parascapular fasciocutaneous free flap reconstruction  due to right tonsillar cancer.  He followed up with radiation  competed 10/13/2014.   Patient Stated Goals to know what to do about his swelling    Currently in Pain? No/denies                         [facility] Adult PT Treatment/Exercise - 08/17/15 0001    Neck Exercises: Seated   Other Seated Exercise shoulder external rotation with elbows at sides to open up chest and retract scapula   Other Seated Exercise neck range of motion    Manual Therapy   Manual Lymphatic Drainage (MLD) short neck , diaphragmatic breasthing, pectoral nodes, posterio cervical nodes. right cheek. lataeral neck going to axilla   bilateral  submandibular area, above top lip and extra time spent on scar massage below left lip   Pt was educated throughout with  hand over hand  insruction at times encouraged to slow down and use a light touch                 PT Education - 08/17/15 1214    Education provided Yes   Education Details self manual lymph drainage    Person(s) Educated Patient   Methods Explanation;Demonstration;Verbal cues;Tactile cues   Comprehension Verbalized understanding;Returned demonstration                [facility] Goals - 08/17/15 1219    CC Long Term Goal  #1   Title pt will veralize understanding of lymphedema risk reduction  practices    Status Achieved   CC Long Term Goal  #2   Title Pt will verbalize understanding of techniques to reduce lymphedema: self manual lymph drianage, use of compression, exercise    Status Achieved            Plan - 08-25-2015 1215    Clinical Impression Statement Pt does not have coverage for flexiotouch.  He feels that he is able to continue with exercise and self manual lymph draiange at home and that he will consider getting a compression garment if the price is reasonable.  He says he has not noticed improvment with the kinesiotape or treatment so far.  His main complaint is that he cannot chew properly and that his back is bothering him.  He feels that he is ready to discharge from PT and will call for a new order in a few months if symptoms contiue and he wants to return for more treatment    Clinical Impairments Affecting Rehab Potential Sugery and radiation to neck    PT Next Visit Plan discharge this visit    Consulted and Agree with Plan of Care Patient          G-Codes - 08/25/2015 August 07, 1217    Functional Assessment Tool Used clinical judgement   Functional Limitation Self care   Self Care Goal Status (X4503) At least 40 percent but less than 60 percent impaired, limited or restricted   Self Care Discharge Status [id number]) At least 40 percent but less than 60 percent impaired, limited or restricted      Problem List Patient Active Problem List    Diagnosis Date Noted  . Bladder cancer ([place]) 02/08/2015  . Anemia in neoplastic disease 10/17/2014  . 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 Squamous cell carcinoma of retromolar trigone ([place]) 06/07/2014  . Abnormal weight loss 05/31/2014  . Hemoptysis 05/31/2014  . Cancer of the lip, oral cavity, and pharynx ([place]) 05/30/2014  . CLL (chronic lymphocytic leukemia) ([place]) 07/07/2012  . ALLERGIC RHINITIS 02/09/2008  . 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 OSTEOARTHRITIS 06/09/2007  . LOW BACK PAIN 06/09/2007  . Diabetes mellitus without complication ([place]) 00/34/9179  . Essential hypertension 03/03/2007  . GERD 03/03/2007  . BENIGN PROSTATIC HYPERTROPHY 03/03/2007     PHYSICAL THERAPY DISCHARGE SUMMARY  Visits from Start of Care: 4  Current functional level related to goals / functional outcomes: Pt reports he knows how to do self manual lymph drainage and is waiting to hear about the compression garment    Remaining deficits: Lymphedema in head and neck    Education / Equipment: Self manual lymph drianage, exercise program, how to get compression garments  Plan: Patient agrees to discharge.  Patient goals were met. Patient is being discharged due to meeting the stated rehab goals.  ?????       Donato Heinz. Owens Shark, PT   08-25-2015, 12:20 PM  [place], [place], [address] Phone: [phone]   Fax:  [phone]  Name: Timothy Kim MRN: [id number] Date of Birth: 1929/10/25

## 2015-08-21 ENCOUNTER — Ambulatory Visit: Payer: Medicare Other

## 2015-09-13 DIAGNOSIS — D0439 Carcinoma in situ of skin of other parts of face: Secondary | ICD-10-CM | POA: Diagnosis not present

## 2015-09-13 DIAGNOSIS — L309 Dermatitis, unspecified: Secondary | ICD-10-CM | POA: Diagnosis not present

## 2015-09-13 DIAGNOSIS — L97909 Non-pressure chronic ulcer of unspecified part of unspecified lower leg with unspecified severity: Secondary | ICD-10-CM | POA: Diagnosis not present

## 2015-09-18 ENCOUNTER — Ambulatory Visit (INDEPENDENT_AMBULATORY_CARE_PROVIDER_SITE_OTHER): Payer: Medicare Other | Admitting: Internal Medicine

## 2015-09-18 ENCOUNTER — Encounter: Payer: Self-pay | Admitting: Internal Medicine

## 2015-09-18 VITALS — BP 140/72 | HR 72 | Temp 98.9°F | Resp 20 | Ht 64.5 in | Wt 151.0 lb

## 2015-09-18 DIAGNOSIS — E119 Type 2 diabetes mellitus without complications: Secondary | ICD-10-CM | POA: Diagnosis not present

## 2015-09-18 DIAGNOSIS — R600 Localized edema: Secondary | ICD-10-CM | POA: Diagnosis not present

## 2015-09-18 DIAGNOSIS — I10 Essential (primary) hypertension: Secondary | ICD-10-CM

## 2015-09-18 DIAGNOSIS — C148 Malignant neoplasm of overlapping sites of lip, oral cavity and pharynx: Secondary | ICD-10-CM

## 2015-09-18 DIAGNOSIS — D630 Anemia in neoplastic disease: Secondary | ICD-10-CM | POA: Diagnosis not present

## 2015-09-18 DIAGNOSIS — C9110 Chronic lymphocytic leukemia of B-cell type not having achieved remission: Secondary | ICD-10-CM

## 2015-09-18 MED ORDER — FUROSEMIDE 40 MG PO TABS: 40.0000 mg | ORAL_TABLET | Freq: Every day | ORAL | Status: DC

## 2015-09-18 NOTE — Patient Instructions (Signed)
Limit your sodium (Salt) intake  Furosemide 40 mg in the morning; 20 mg early afternoon;  Decrease  20 mg dose .  If lower extremity swelling improves  Oncology follow-up as scheduled  Return in one month for follow-up

## 2015-09-18 NOTE — Progress Notes (Signed)
Pre visit review using our clinic review tool, if applicable. No additional management support is needed unless otherwise documented below in the visit note. 

## 2015-09-18 NOTE — Progress Notes (Signed)
Subjective:    Patient ID: Timothy Kim, male    DOB: 19-Jan-1930, 82 y.o.   MRN: [id number]  HPI  82 year old patient who presents with a chief complaint of worsening lower extremity edema.  He was last seen in this office in August with plus 1 lower extremity edema.  He was treated with furosemide 20.  Restricted salt diet, elevation and compression hose. Or the past few months.  The lower extremity swelling.  Has intensified. He has essential hypertension and history of diabetes which has been diet controlled He has had a recent diagnosis of melanoma status post resection of his for head region  Lab Results  Component Value Date   HGBA1C 5.5 02/01/2014    Past Medical History  Diagnosis Date  . Glaucoma     both eyes  . Allergic rhinitis   . BPH (benign prostatic hypertrophy)   . Organic impotence   . History of radiation to head and neck region 09-04-2014 to  10-13-2014    right retromolar region and right neck  60 Gy  . Venous insufficiency   . Pulmonary nodule     benign  and stable per last CT  . Hypertension   . OA (osteoarthritis)   . Dysphasia functional--  speech therapy    post op  radical neck dissection 07-21-2014--  changed diet to no chewy food , softer foods , states with the changes swallowed okay without any issues  . DDD (degenerative disc disease), cervical   . 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 Radiation-induced dermatitis   . Bilateral lower extremity edema     feet  . Macular degeneration     right eye only  . History of tracheostomy [facility])     post op radical neck dissection  07-21-2014--  post op tracheostomy on 07-25-2014  decannulated and sutured 07-29-2014  . Alcohol abuse   . Borderline diabetes   . History of gastroesophageal reflux (GERD)   . DDD  (degenerative disc disease), lumbosacral   . Nephrolithiasis     right side--  per CT from [facility] 01-17-2015 non-obstructing  . Anemia in neoplastic disease     chronic  . History of radiation therapy     09-04-2014 to 10-13-2014  Right retromolar region (tumor bed) and right neck/  60Gy in 30 fractions to tumor bed and 54Gy in 30 fractions to neck  . CLL (chronic lymphocytic leukemia) [facility]) oncologist-  dr Heath Lark ([facility])    dx Jan 2014 --- Stage 1--  currently asymptomatic as of Apr 2016 and No active disease  . 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 Squamous cell carcinoma of retromolar trigone [facility]) oncologist-  dr Isidore Moos    Stage IVB, pT1b, pN0, Grade 2, +PNI, no LVSI---  S/P  RADICAL NECK DISSECTION AND RADIATION  . Transitional cell carcinoma of bladder [facility]) urologist-- dr Milana Na - grade    Social History   Social History  . Marital Status: Single    Spouse Name: N/A  . Number of Children: 2  . Years of Education: N/A   Occupational History  . Not on file.   Social History Main Topics  . Smoking status: Never Smoker   .  Smokeless tobacco: Never Used  . Alcohol Use: 16.8 oz/week    28 Shots of liquor per week     Comment: Gin and vodka 3-4 daily   (Hx alcohol withdrawal )  . Drug Use: No  . Sexual Activity: Not on file   Other Topics Concern  . Not on file   Social History Narrative   Patient is divorced with 2 children.   Patient has never smoked. Patient has never used smokeless tobacco.    Patient drinks gin or vodka on a daily basis.    Past Surgical History  Procedure Laterality Date  . Rotator cuff repair Right 12-11-2000  . Tonsil biopsy Right 05/24/14  . Maxillectomy Right 07/21/14,   [facility]    Radical Neck Dissection, Maxillectomy, Right Marginal Mandibulectomy, dental extractions, Parascauplar fasciocutaeous Free flap reconstruction  . Laminectomy with posterior lateral  arthrodesis level 1  09-01-2003    L3 -5 LAMINECTOMY W/ DECOMPRESSION AND FUSION L4-5  . Cataract extraction w/ intraocular lens  implant, bilateral    . Retinal detachment repair w/ scleral buckle le  11-03-2000  . Inguinal hernia repair Bilateral right 1988//  left 1970's  . Transurethral resection of bladder tumor N/A 02/02/2015    Procedure: TRANSURETHRAL RESECTION OF BLADDER TUMOR (TURBT);  Surgeon: Lowella Bandy, MD;  Location: [facility];  Service: Urology;  Laterality: N/A;  . Cystoscopy N/A 02/02/2015    Procedure: CYSTOSCOPY, INSTILLATION OF MITOMYCIN C;  Surgeon: Lowella Bandy, MD;  Location: [facility];  Service: Urology;  Laterality: N/A;  . Cystoscopy with biopsy N/A 03/27/2015    Procedure: CYSTOSCOPY WITH BLADDER  BIOPSY WITH FULGERATION;  Surgeon: Festus Aloe, MD;  Location: [facility];  Service: Urology;  Laterality: N/A;    Family History  Problem Relation Age of Onset  . Cancer Mother     lung ca  . Alcohol abuse Sister   . Hypertension Neg Hx     family hx  . Sudden death Neg Hx     famiylhx    No Known Allergies  Current Outpatient Prescriptions on File Prior to Visit  Medication Sig Dispense Refill  . brimonidine-timolol (COMBIGAN) 0.2-0.5 % ophthalmic solution Apply 1 drop to eye 2 (two) times daily.     Marland Kitchen DHEA 50 MG TABS Take 1 tablet by mouth daily.    . fluticasone (FLONASE) 50 MCG/ACT nasal spray Place 2 sprays into both nostrils daily.  4  . furosemide (LASIX) 20 MG tablet TAKE 1 TABLET (20 MG TOTAL) BY MOUTH DAILY. 30 tablet 3  . Krill Oil 300 MG CAPS Take 300 mg by mouth daily.    Marland Kitchen latanoprost (XALATAN) 0.005 % ophthalmic solution Place 1 drop into both eyes every evening.   4  . Lutein-Zeaxanthin 15-0.7 MG CAPS Take 1 tablet by mouth daily.    . metoprolol succinate (TOPROL-XL) 100 MG 24 hr tablet TAKE HALF TABLET IMMEDIATELY FOLLOWING A MEAL.  Currently taking 50mg  tab per his PC (Patient taking  differently: Take 50 mg by mouth every morning. TAKE HALF TABLET IMMEDIATELY FOLLOWING A MEAL.  Currently taking 50mg  tab per his PC) 90 tablet 3  . naproxen sodium (ANAPROX) 220 MG tablet Take 220 mg by mouth 1 day or 1 dose. Reported on 07/27/2015    . sildenafil (VIAGRA) 100 MG tablet Take 100 mg by mouth daily as needed for erectile dysfunction.    . sodium chloride (OCEAN) 0.65 % nasal spray Place 2 sprays into the nose  as needed.     . sodium fluoride (DENTA 5000 PLUS) 1.1 % CREA dental cream Apply a thin ribbon of gel to tooth brush. Brush teeth for 2 minutes. Spit out excess. Repeat nightly. 51 g 0  . tadalafil (CIALIS) 20 MG tablet Take 20 mg by mouth daily as needed for erectile dysfunction.    . triamcinolone cream (KENALOG) 0.1 % APPLY TO AFFECTED AREA TWICE DAILY 80 g 2  . zolpidem (AMBIEN) 5 MG tablet Take 1 tablet (5 mg total) by mouth at bedtime as needed. for sleep 30 tablet 2   No current facility-administered medications on file prior to visit.    BP 140/72 mmHg  Pulse 72  Temp(Src) 98.9 F (37.2 C) (Oral)  Resp 20  Ht 5' 4.5" (1.638 m)  Wt 151 lb (68.493 kg)  BMI 25.53 kg/m2  SpO2 98%     Review of Systems  Constitutional: Positive for unexpected weight change. Negative for fever, chills, appetite change and fatigue.  HENT: Negative for congestion, dental problem, ear pain, hearing loss, sore throat, tinnitus, trouble swallowing and voice change.   Eyes: Negative for pain, discharge and visual disturbance.  Respiratory: Negative for cough, chest tightness, wheezing and stridor.   Cardiovascular: Positive for leg swelling. Negative for chest pain and palpitations.  Gastrointestinal: Negative for nausea, vomiting, abdominal pain, diarrhea, constipation, blood in stool and abdominal distention.  Genitourinary: Negative for urgency, hematuria, flank pain, discharge, difficulty urinating and genital sores.  Musculoskeletal: Negative for myalgias, back pain, joint  swelling, arthralgias, gait problem and neck stiffness.       Right hip pain  Skin: Negative for rash.  Neurological: Negative for dizziness, syncope, speech difficulty, weakness, numbness and headaches.  Hematological: Negative for adenopathy. Does not bruise/bleed easily.  Psychiatric/Behavioral: Negative for behavioral problems and dysphoric mood. The patient is not nervous/anxious.        Objective:   Physical Exam  Constitutional: He is oriented to person, place, and time. He appears well-developed.  HENT:  Head: Normocephalic.  Right Ear: External ear normal.  Left Ear: External ear normal.  Eyes: Conjunctivae and EOM are normal.  Neck: Normal range of motion.  Cardiovascular: Normal rate and normal heart sounds.   Pulmonary/Chest: Breath sounds normal. No respiratory distress. He has no rales.  Abdominal: Bowel sounds are normal.  Musculoskeletal: Normal range of motion. He exhibits edema. He exhibits no tenderness.  Plus 3 edema distal to the knees bilaterally  Neurological: He is alert and oriented to person, place, and time.  Skin:  Bandage forehead area  Psychiatric: He has a normal mood and affect. His behavior is normal.          Assessment & Plan:   Lower extremity edema.  Will increase diuresis.  Check lab History of anemia.  Will check CBC Essential hypertension, stable  History of diet-controlled diabetes.  Will check a hemoglobin A1c acute Follow-up oncology

## 2015-09-19 DIAGNOSIS — Z0000 Encounter for general adult medical examination without abnormal findings: Secondary | ICD-10-CM | POA: Diagnosis not present

## 2015-09-19 DIAGNOSIS — C672 Malignant neoplasm of lateral wall of bladder: Secondary | ICD-10-CM | POA: Diagnosis not present

## 2015-09-19 LAB — COMPREHENSIVE METABOLIC PANEL
ALK PHOS: 84 U/L (ref 39–117)
ALT: 13 U/L (ref 0–53)
AST: 24 U/L (ref 0–37)
Albumin: 3.4 g/dL — ABNORMAL LOW (ref 3.5–5.2)
BUN: 9 mg/dL (ref 6–23)
CO2: 33 meq/L — AB (ref 19–32)
Calcium: 8.9 mg/dL (ref 8.4–10.5)
Chloride: 102 mEq/L (ref 96–112)
Creatinine, Ser: 0.8 mg/dL (ref 0.40–1.50)
GFR: 98.03 mL/min (ref >60.00)
GLUCOSE: 86 mg/dL (ref 70–99)
POTASSIUM: 4.1 meq/L (ref 3.5–5.1)
SODIUM: 140 meq/L (ref 135–145)
TOTAL PROTEIN: 6.6 g/dL (ref 6.0–8.3)
Total Bilirubin: 0.5 mg/dL (ref 0.2–1.2)

## 2015-09-19 LAB — CBC WITH DIFFERENTIAL/PLATELET
BASOS PCT: 1.7 % (ref 0.0–3.0)
Basophils Absolute: 0.1 10*3/uL (ref 0.0–0.1)
EOS PCT: 8.1 % — AB (ref 0.0–5.0)
Eosinophils Absolute: 0.7 10*3/uL (ref 0.0–0.7)
HCT: 33.1 % — ABNORMAL LOW (ref 39.0–52.0)
Hemoglobin: 11.1 g/dL — ABNORMAL LOW (ref 13.0–17.0)
LYMPHS ABS: 3.4 10*3/uL (ref 0.7–4.0)
Lymphocytes Relative: 41.6 % (ref 12.0–46.0)
MCHC: 33.5 g/dL (ref 30.0–36.0)
MCV: 104.7 fl — ABNORMAL HIGH (ref 78.0–100.0)
MONO ABS: 1.6 10*3/uL — AB (ref 0.1–1.0)
Monocytes Relative: 19.7 % — ABNORMAL HIGH (ref 3.0–12.0)
NEUTROS ABS: 2.3 10*3/uL (ref 1.4–7.7)
Neutrophils Relative %: 28.9 % — ABNORMAL LOW (ref 43.0–77.0)
PLATELETS: 262 10*3/uL (ref 150.0–400.0)
RBC: 3.17 Mil/uL — AB (ref 4.22–5.81)
RDW: 13.3 % (ref 11.5–15.5)
WBC: 8.1 10*3/uL (ref 4.0–10.5)

## 2015-09-19 LAB — HEMOGLOBIN A1C: HEMOGLOBIN A1C: 5.3 % (ref 4.6–6.5)

## 2015-10-11 ENCOUNTER — Other Ambulatory Visit: Payer: Self-pay | Admitting: Internal Medicine

## 2015-10-11 ENCOUNTER — Other Ambulatory Visit: Payer: Self-pay | Admitting: Hematology and Oncology

## 2015-10-16 ENCOUNTER — Encounter: Payer: Self-pay | Admitting: Hematology and Oncology

## 2015-10-16 ENCOUNTER — Other Ambulatory Visit (HOSPITAL_BASED_OUTPATIENT_CLINIC_OR_DEPARTMENT_OTHER): Payer: Medicare Other

## 2015-10-16 ENCOUNTER — Telehealth: Payer: Self-pay | Admitting: Hematology and Oncology

## 2015-10-16 ENCOUNTER — Ambulatory Visit (HOSPITAL_BASED_OUTPATIENT_CLINIC_OR_DEPARTMENT_OTHER): Payer: Medicare Other | Admitting: Hematology and Oncology

## 2015-10-16 VITALS — BP 129/84 | HR 76 | Temp 97.9°F | Resp 18 | Ht 64.5 in | Wt 142.2 lb

## 2015-10-16 DIAGNOSIS — K2100 Gastro-esophageal reflux disease with esophagitis, without bleeding: Secondary | ICD-10-CM

## 2015-10-16 DIAGNOSIS — Z8551 Personal history of malignant neoplasm of bladder: Secondary | ICD-10-CM

## 2015-10-16 DIAGNOSIS — C9110 Chronic lymphocytic leukemia of B-cell type not having achieved remission: Secondary | ICD-10-CM | POA: Diagnosis not present

## 2015-10-16 DIAGNOSIS — E876 Hypokalemia: Secondary | ICD-10-CM | POA: Insufficient documentation

## 2015-10-16 DIAGNOSIS — T50905A Adverse effect of unspecified drugs, medicaments and biological substances, initial encounter: Secondary | ICD-10-CM

## 2015-10-16 DIAGNOSIS — K219 Gastro-esophageal reflux disease without esophagitis: Secondary | ICD-10-CM

## 2015-10-16 DIAGNOSIS — Z79899 Other long term (current) drug therapy: Secondary | ICD-10-CM

## 2015-10-16 DIAGNOSIS — C148 Malignant neoplasm of overlapping sites of lip, oral cavity and pharynx: Secondary | ICD-10-CM | POA: Diagnosis not present

## 2015-10-16 DIAGNOSIS — R5383 Other fatigue: Secondary | ICD-10-CM

## 2015-10-16 DIAGNOSIS — C062 Malignant neoplasm of retromolar area: Secondary | ICD-10-CM

## 2015-10-16 LAB — CBC WITH DIFFERENTIAL/PLATELET
BASO%: 0.4 % (ref 0.0–2.0)
Basophils Absolute: 0 10*3/uL (ref 0.0–0.1)
EOS ABS: 0.2 10*3/uL (ref 0.0–0.5)
EOS%: 1.8 % (ref 0.0–7.0)
HCT: 39.9 % (ref 38.4–49.9)
HGB: 13.4 g/dL (ref 13.0–17.1)
LYMPH%: 36.2 % (ref 14.0–49.0)
MCH: 34.6 pg — AB (ref 27.2–33.4)
MCHC: 33.5 g/dL (ref 32.0–36.0)
MCV: 103.4 fL — AB (ref 79.3–98.0)
MONO#: 1.2 10*3/uL — AB (ref 0.1–0.9)
MONO%: 11.9 % (ref 0.0–14.0)
NEUT#: 4.9 10*3/uL (ref 1.5–6.5)
NEUT%: 49.7 % (ref 39.0–75.0)
PLATELETS: 199 10*3/uL (ref 140–400)
RBC: 3.86 10*6/uL — AB (ref 4.20–5.82)
RDW: 12.3 % (ref 11.0–14.6)
WBC: 9.8 10*3/uL (ref 4.0–10.3)
lymph#: 3.5 10*3/uL — ABNORMAL HIGH (ref 0.9–3.3)

## 2015-10-16 LAB — COMPREHENSIVE METABOLIC PANEL
ALT: 20 U/L (ref 0–55)
ANION GAP: 11 meq/L (ref 3–11)
AST: 49 U/L — ABNORMAL HIGH (ref 5–34)
Albumin: 3.4 g/dL — ABNORMAL LOW (ref 3.5–5.0)
Alkaline Phosphatase: 104 U/L (ref 40–150)
BUN: 11.8 mg/dL (ref 7.0–26.0)
CALCIUM: 9.9 mg/dL (ref 8.4–10.4)
CHLORIDE: 88 meq/L — AB (ref 98–109)
CO2: 36 meq/L — AB (ref 22–29)
Creatinine: 1.1 mg/dL (ref 0.7–1.3)
EGFR: 61 mL/min/{1.73_m2} — AB (ref >90)
GLUCOSE: 129 mg/dL (ref 70–140)
POTASSIUM: 3 meq/L — AB (ref 3.5–5.1)
Sodium: 135 mEq/L — ABNORMAL LOW (ref 136–145)
Total Bilirubin: 0.81 mg/dL (ref 0.20–1.20)
Total Protein: 7.5 g/dL (ref 6.4–8.3)

## 2015-10-16 LAB — LACTATE DEHYDROGENASE: LDH: 263 U/L — AB (ref 125–245)

## 2015-10-16 LAB — TSH: TSH: 1.694 m[IU]/L (ref 0.320–4.118)

## 2015-10-16 MED ORDER — OMEPRAZOLE 40 MG PO CPDR: 40.0000 mg | DELAYED_RELEASE_CAPSULE | Freq: Every day | ORAL | Status: DC

## 2015-10-16 NOTE — Assessment & Plan Note (Signed)
He denies recent hematuria or bladder symptoms. Defer to urology for follow follow-up

## 2015-10-16 NOTE — Assessment & Plan Note (Signed)
This is likely related to Lasix therapy. I recommend he increase potassium supplement twice a day only for one week and follow-up with primary care doctor for medical management.

## 2015-10-16 NOTE — Progress Notes (Signed)
[place] OFFICE PROGRESS NOTE  Patient Care Team: Marletta Lor, MD as PCP - General Jodi Marble, MD as Consulting Physician (Otolaryngology) Heath Lark, MD as Consulting Physician (Hematology and Oncology) Leota Sauers, RN as Oncology Nurse Chisholm, RD as Dietitian (Nutrition) Eppie Gibson, MD as Attending Physician (Radiation Oncology)  SUMMARY OF ONCOLOGIC HISTORY: Oncology History   Oral cancer   Staging form: Lip and Oral Cavity, AJCC 7th Edition     Clinical: Stage IVA (T4a, N0, M0) - Signed by Heath Lark, MD on 06/07/2014       CLL (chronic lymphocytic leukemia) ([place])   07/21/2012 Initial Diagnosis Accession #: [id number] FLow cytometry confirmed CLL    Cancer of the lip, oral cavity, and pharynx ([place])   05/24/2014 Surgery Flexible fiberoptic laryngoscope be confirmed asymmetric swelling with superficial ulceration at the right tonsil/retromolar mass. Biopsy was performed on the right tonsil   05/24/2014 Pathology Results Accession: [id number] right tonsil biopsy confirmed squamous cell carcinoma.   06/05/2014 Imaging CT scan showed retromolar mass invading into the alveolar ridge   06/06/2014 Imaging PET CT showed no evidence of metastatic disease   07/24/2014 Surgery He had extensive surgery at [facility] including Tracheotomy, R SND (levels 1-3), right infrastructure maxillectomy with right rim mandibulectomy, dental extractions, right parascapular fasciocutaneous free flap and LN dissection.   09/04/2014 - 10/13/2014 Radiation Therapy Rec'd Helical IMRT to right retromolar region (tumor bed) and right neck: 60 Gy in 30 fractions to tumor bed and 54 Gy in 30 fractions to neck.   01/04/2015 Imaging CT Neck:  No residual oral cavity tumor or lymphadenopathy identified.                              01/04/2015 Imaging CT Chest:  No evidence of metastatic disease.   Pulmonary nodule in right base has remained stable since 2004 and is compatible  with a benign process.     History of bladder cancer   02/02/2015 Surgery He underwent TURBT   02/02/2015 Pathology Results Accession: [id number] TURBT result showed high grade urothelial cancer   03/27/2015 Surgery He had Cystoscopy, bladder biopsy and fulguration, 2 - 5 cm   03/27/2015 Pathology Results Accession: [id number] pathology was negative for residual disease    INTERVAL HISTORY: Please see below for problem oriented charting. He returns for further follow-up. He complained of symptoms of indigestion/reflux after he eats Denies recent weight loss. No new lymphadenopathy. Denies hematuria or symptoms of cystitis He denies recent infection  REVIEW OF SYSTEMS:   Constitutional: Denies fevers, chills or abnormal weight loss Eyes: Denies blurriness of vision Ears, nose, mouth, throat, and face: Denies mucositis or sore throat Respiratory: Denies cough, dyspnea or wheezes Cardiovascular: Denies palpitation, chest discomfort or lower extremity swelling Skin: Denies abnormal skin rashes Lymphatics: Denies new lymphadenopathy or easy bruising Neurological:Denies numbness, tingling or new weaknesses Behavioral/Psych: Mood is stable, no new changes  All other systems were reviewed with the patient and are negative.  I have reviewed the past medical history, past surgical history, social history and family history with the patient and they are unchanged from previous note.  ALLERGIES:  has No Known Allergies.  MEDICATIONS:  Current Outpatient Prescriptions  Medication Sig Dispense Refill  . brimonidine-timolol (COMBIGAN) 0.2-0.5 % ophthalmic solution Apply 1 drop to eye 2 (two) times daily.     Marland Kitchen DHEA 50 MG TABS Take 1 tablet by mouth daily.    Marland Kitchen  fluticasone (FLONASE) 50 MCG/ACT nasal spray Place 2 sprays into both nostrils daily.  4  . furosemide (LASIX) 20 MG tablet TAKE 1 TABLET (20 MG TOTAL) BY MOUTH DAILY. 30 tablet 5  . furosemide (LASIX) 40 MG tablet Take 1 tablet (40 mg  total) by mouth daily. 90 tablet 3  . Krill Oil 300 MG CAPS Take 300 mg by mouth daily.    Marland Kitchen latanoprost (XALATAN) 0.005 % ophthalmic solution Place 1 drop into both eyes every evening.   4  . Lutein-Zeaxanthin 15-0.7 MG CAPS Take 1 tablet by mouth daily.    . metoprolol succinate (TOPROL-XL) 100 MG 24 hr tablet TAKE HALF TABLET IMMEDIATELY FOLLOWING A MEAL.  Currently taking 50mg  tab per his PC (Patient taking differently: Take 50 mg by mouth every morning. TAKE HALF TABLET IMMEDIATELY FOLLOWING A MEAL.  Currently taking 50mg  tab per his PC) 90 tablet 3  . naproxen sodium (ANAPROX) 220 MG tablet Take 220 mg by mouth 1 day or 1 dose. Reported on 07/27/2015    . sildenafil (VIAGRA) 100 MG tablet Take 100 mg by mouth daily as needed for erectile dysfunction.    . sodium chloride (OCEAN) 0.65 % nasal spray Place 2 sprays into the nose as needed.     . sodium fluoride (DENTA 5000 PLUS) 1.1 % CREA dental cream Apply a thin ribbon of gel to tooth brush. Brush teeth for 2 minutes. Spit out excess. Repeat nightly. 51 g 0  . tadalafil (CIALIS) 20 MG tablet Take 20 mg by mouth daily as needed for erectile dysfunction.    . triamcinolone cream (KENALOG) 0.1 % APPLY TO AFFECTED AREA TWICE DAILY 80 g 2  . zolpidem (AMBIEN) 5 MG tablet Take 1 tablet (5 mg total) by mouth at bedtime as needed. for sleep 30 tablet 2  . omeprazole (PRILOSEC) 40 MG capsule Take 1 capsule (40 mg total) by mouth daily. 90 capsule 3   No current facility-administered medications for this visit.    PHYSICAL EXAMINATION: ECOG PERFORMANCE STATUS: 1 - Symptomatic but completely ambulatory  Filed Vitals:   10/16/15 1056  BP: 129/84  Pulse: 76  Temp: 97.9 F (36.6 C)  Resp: 18   Filed Weights   10/16/15 1056  Weight: 142 lb 3.2 oz (64.501 kg)    GENERAL:alert, no distress and comfortable SKIN: skin color, texture, turgor are normal, no rashes or significant lesions EYES: normal, Conjunctiva are pink and non-injected, sclera  clear OROPHARYNX:Noted significant surgery to his face. No other abnormalities NECK: supple, thyroid normal size, non-tender, without nodularity LYMPH:  He has palpable axillary lymphadenopathy, unchanged  LUNGS: clear to auscultation and percussion with normal breathing effort HEART: regular rate & rhythm and no murmurs and no lower extremity edema ABDOMEN:abdomen soft, non-tender and normal bowel sounds Musculoskeletal:no cyanosis of digits and no clubbing  NEURO: alert & oriented x 3 with fluent speech, no focal motor/sensory deficits  LABORATORY DATA:  I have reviewed the data as listed    Component Value Date/Time   NA 135* 10/16/2015 1039   NA 140 09/18/2015 1605   K 3.0* 10/16/2015 1039   K 4.1 09/18/2015 1605   CL 102 09/18/2015 1605   CO2 36* 10/16/2015 1039   CO2 33* 09/18/2015 1605   GLUCOSE 129 10/16/2015 1039   GLUCOSE 86 09/18/2015 1605   BUN 11.8 10/16/2015 1039   BUN 9 09/18/2015 1605   CREATININE 1.1 10/16/2015 1039   CREATININE 0.80 09/18/2015 1605   CALCIUM 9.9 10/16/2015 1039  CALCIUM 8.9 09/18/2015 1605   PROT 7.5 10/16/2015 1039   PROT 6.6 09/18/2015 1605   ALBUMIN 3.4* 10/16/2015 1039   ALBUMIN 3.4* 09/18/2015 1605   AST 49* 10/16/2015 1039   AST 24 09/18/2015 1605   ALT 20 10/16/2015 1039   ALT 13 09/18/2015 1605   ALKPHOS 104 10/16/2015 1039   ALKPHOS 84 09/18/2015 1605   BILITOT 0.81 10/16/2015 1039   BILITOT 0.5 09/18/2015 1605   GFRNONAA 81.57 12/12/2009 0000   GFRAA 121 08/09/2008 1103    No results found for: SPEP, UPEP  Lab Results  Component Value Date   WBC 9.8 10/16/2015   NEUTROABS 4.9 10/16/2015   HGB 13.4 10/16/2015   HCT 39.9 10/16/2015   MCV 103.4* 10/16/2015   PLT 199 10/16/2015      Chemistry      Component Value Date/Time   NA 135* 10/16/2015 1039   NA 140 09/18/2015 1605   K 3.0* 10/16/2015 1039   K 4.1 09/18/2015 1605   CL 102 09/18/2015 1605   CO2 36* 10/16/2015 1039   CO2 33* 09/18/2015 1605   BUN 11.8  10/16/2015 1039   BUN 9 09/18/2015 1605   CREATININE 1.1 10/16/2015 1039   CREATININE 0.80 09/18/2015 1605      Component Value Date/Time   CALCIUM 9.9 10/16/2015 1039   CALCIUM 8.9 09/18/2015 1605   ALKPHOS 104 10/16/2015 1039   ALKPHOS 84 09/18/2015 1605   AST 49* 10/16/2015 1039   AST 24 09/18/2015 1605   ALT 20 10/16/2015 1039   ALT 13 09/18/2015 1605   BILITOT 0.81 10/16/2015 1039   BILITOT 0.5 09/18/2015 1605     ASSESSMENT & PLAN:  Cancer of the lip, oral cavity, and pharynx  He has completed all treatment. Clinically, he has no evidence of disease I will defer for future follow-up to ENT and radiation oncology   History of bladder cancer He denies recent hematuria or bladder symptoms. Defer to urology for follow follow-up  CLL (chronic lymphocytic leukemia) He remained with low-grade disease burden stage I only. No need for treatment. I will see him back in one year  GERD He has symptoms of indigestion and epigastric discomfort, likely due to gastritis. This is likely secondary to NSAID. I prescribed proton pump inhibitor for him and recommend he reduce NSAID intake if possible  Drug-induced hypokalemia This is likely related to Lasix therapy. I recommend he increase potassium supplement twice a day only for one week and follow-up with primary care doctor for medical management.   Orders Placed This Encounter  Procedures  . CBC with Differential/Platelet    Standing Status: Future     Number of Occurrences:      Standing Expiration Date: 11/19/2016   All questions were answered. The patient knows to call the clinic with any problems, questions or concerns. No barriers to learning was detected. I spent 20 minutes counseling the patient face to face. The total time spent in the appointment was 30 minutes and more than 50% was on counseling and review of test results     [facility], [place], MD 10/16/2015 1:41 PM

## 2015-10-16 NOTE — Assessment & Plan Note (Signed)
He remained with low-grade disease burden stage I only. No need for treatment. I will see him back in one year 

## 2015-10-16 NOTE — Assessment & Plan Note (Signed)
He has completed all treatment. Clinically, he has no evidence of disease I will defer for future follow-up to ENT and radiation oncology  

## 2015-10-16 NOTE — Telephone Encounter (Signed)
Gave and printed appt sched and avs for pt for April 2018 °

## 2015-10-16 NOTE — Assessment & Plan Note (Signed)
He has symptoms of indigestion and epigastric discomfort, likely due to gastritis. This is likely secondary to NSAID. I prescribed proton pump inhibitor for him and recommend he reduce NSAID intake if possible

## 2015-10-26 ENCOUNTER — Telehealth: Payer: Self-pay | Admitting: *Deleted

## 2015-10-26 NOTE — Telephone Encounter (Signed)
Spoke to pt, told him Dr.K read the note that you gave Juliann Pulse to give to him and Dr.K said for you to stop the Furosemide all together. Pt verbalized understanding.

## 2015-11-05 ENCOUNTER — Encounter: Payer: Self-pay | Admitting: Internal Medicine

## 2015-11-05 ENCOUNTER — Ambulatory Visit (INDEPENDENT_AMBULATORY_CARE_PROVIDER_SITE_OTHER): Payer: Medicare Other | Admitting: Internal Medicine

## 2015-11-05 VITALS — BP 128/72 | HR 84 | Temp 99.0°F | Resp 18 | Ht 64.5 in | Wt 145.0 lb

## 2015-11-05 DIAGNOSIS — E876 Hypokalemia: Secondary | ICD-10-CM

## 2015-11-05 DIAGNOSIS — E119 Type 2 diabetes mellitus without complications: Secondary | ICD-10-CM

## 2015-11-05 DIAGNOSIS — M545 Low back pain: Secondary | ICD-10-CM | POA: Diagnosis not present

## 2015-11-05 DIAGNOSIS — I10 Essential (primary) hypertension: Secondary | ICD-10-CM | POA: Diagnosis not present

## 2015-11-05 DIAGNOSIS — G8929 Other chronic pain: Secondary | ICD-10-CM

## 2015-11-05 DIAGNOSIS — T50905A Adverse effect of unspecified drugs, medicaments and biological substances, initial encounter: Secondary | ICD-10-CM

## 2015-11-05 MED ORDER — POTASSIUM CHLORIDE CRYS ER 20 MEQ PO TBCR: 20.0000 meq | EXTENDED_RELEASE_TABLET | Freq: Every day | ORAL | Status: DC

## 2015-11-05 NOTE — Progress Notes (Signed)
Pre visit review using our clinic review tool, if applicable. No additional management support is needed unless otherwise documented below in the visit note. 

## 2015-11-05 NOTE — Progress Notes (Signed)
Subjective:    Patient ID: Timothy Kim, male    DOB: 1929-08-03, 82 y.o.   MRN: [id number]  HPI  Wt Readings from Last 3 Encounters:  11/05/15 145 lb (65.772 kg)  10/16/15 142 lb 3.2 oz (64.501 kg)  09/18/15 151 lb (68.33 kg)   82 year old patient with multiple medical problems.  He has essential hypertension, diet-controlled diabetes and is followed closely by oncology with a history of cancer of the retromolar trigone, as well as CLL.  He has had some intermittent problems with lower extremity edema.  Therapy with furosemide was discontinued about 3 weeks ago when patient noted to have significant hypokalemia with potassium of 3.0.  He has had no significant edema issues since discontinuation of diuretic therapy In general feels fairly well today except for chronic low back pain that is aggravated by walking.  He does have history of spinal stenosis and is scheduled for orthopedic follow-up soon.  He is status post laminectomy in 2004  Past Medical History  Diagnosis Date  . Glaucoma     both eyes  . Allergic rhinitis   . BPH (benign prostatic hypertrophy)   . Organic impotence   . History of radiation to head and neck region 09-04-2014 to  10-13-2014    right retromolar region and right neck  60 Gy  . Venous insufficiency   . Pulmonary nodule     benign  and stable per last CT  . Hypertension   . OA (osteoarthritis)   . Dysphasia functional--  speech therapy    post op  radical neck dissection 07-21-2014--  changed diet to no chewy food , softer foods , states with the changes swallowed okay without any issues  . DDD (degenerative disc disease), cervical   . 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 Radiation-induced dermatitis   . Bilateral lower extremity edema     feet  . Macular  degeneration     right eye only  . History of tracheostomy [facility])     post op radical neck dissection  07-21-2014--  post op tracheostomy on 07-25-2014  decannulated and sutured 07-29-2014  . Alcohol abuse   . Borderline diabetes   . History of gastroesophageal reflux (GERD)   . DDD (degenerative disc disease), lumbosacral   . Nephrolithiasis     right side--  per CT from [facility] 01-17-2015 non-obstructing  . Anemia in neoplastic disease     chronic  . History of radiation therapy     09-04-2014 to 10-13-2014  Right retromolar region (tumor bed) and right neck/  60Gy in 30 fractions to tumor bed and 54Gy in 30 fractions to neck  . CLL (chronic lymphocytic leukemia) [facility]) oncologist-  dr Heath Lark ([facility])    dx Jan 2014 --- Stage 1--  currently asymptomatic as of Apr 2016 and No active disease  . 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 Squamous cell carcinoma of retromolar trigone [facility]) oncologist-  dr Isidore Moos    Stage IVB, pT1b, pN0, Grade 2, +PNI, no LVSI---  S/P  RADICAL NECK DISSECTION AND RADIATION  . Transitional cell carcinoma of bladder [facility]) urologist-- dr Milana Na - grade     Social History   Social History  .  Marital Status: Single    Spouse Name: N/A  . Number of Children: 2  . Years of Education: N/A   Occupational History  . Not on file.   Social History Main Topics  . Smoking status: Never Smoker   . Smokeless tobacco: Never Used  . Alcohol Use: 16.8 oz/week    28 Shots of liquor per week     Comment: Gin and vodka 3-4 daily   (Hx alcohol withdrawal )  . Drug Use: No  . Sexual Activity: Not on file   Other Topics Concern  . Not on file   Social History Narrative   Patient is divorced with 2 children.   Patient has never smoked. Patient has never used smokeless tobacco.    Patient drinks gin or vodka on a daily basis.    Past Surgical History  Procedure Laterality Date  .  Rotator cuff repair Right 12-11-2000  . Tonsil biopsy Right 05/24/14  . Maxillectomy Right 07/21/14,   [facility]    Radical Neck Dissection, Maxillectomy, Right Marginal Mandibulectomy, dental extractions, Parascauplar fasciocutaeous Free flap reconstruction  . Laminectomy with posterior lateral arthrodesis level 1  09-01-2003    L3 -5 LAMINECTOMY W/ DECOMPRESSION AND FUSION L4-5  . Cataract extraction w/ intraocular lens  implant, bilateral    . Retinal detachment repair w/ scleral buckle le  11-03-2000  . Inguinal hernia repair Bilateral right 1988//  left 1970's  . Transurethral resection of bladder tumor N/A 02/02/2015    Procedure: TRANSURETHRAL RESECTION OF BLADDER TUMOR (TURBT);  Surgeon: Lowella Bandy, MD;  Location: [facility];  Service: Urology;  Laterality: N/A;  . Cystoscopy N/A 02/02/2015    Procedure: CYSTOSCOPY, INSTILLATION OF MITOMYCIN C;  Surgeon: Lowella Bandy, MD;  Location: [facility];  Service: Urology;  Laterality: N/A;  . Cystoscopy with biopsy N/A 03/27/2015    Procedure: CYSTOSCOPY WITH BLADDER  BIOPSY WITH FULGERATION;  Surgeon: Festus Aloe, MD;  Location: [facility];  Service: Urology;  Laterality: N/A;    Family History  Problem Relation Age of Onset  . Cancer Mother     lung ca  . Alcohol abuse Sister   . Hypertension Neg Hx     family hx  . Sudden death Neg Hx     famiylhx    No Known Allergies  Current Outpatient Prescriptions on File Prior to Visit  Medication Sig Dispense Refill  . brimonidine-timolol (COMBIGAN) 0.2-0.5 % ophthalmic solution Apply 1 drop to eye 2 (two) times daily.     Marland Kitchen DHEA 50 MG TABS Take 1 tablet by mouth daily.    . fluticasone (FLONASE) 50 MCG/ACT nasal spray Place 2 sprays into both nostrils daily.  4  . Krill Oil 300 MG CAPS Take 300 mg by mouth daily.    Marland Kitchen latanoprost (XALATAN) 0.005 % ophthalmic solution Place 1 drop into both eyes every evening.   4  . Lutein-Zeaxanthin 15-0.7 MG  CAPS Take 1 tablet by mouth daily.    . metoprolol succinate (TOPROL-XL) 100 MG 24 hr tablet TAKE HALF TABLET IMMEDIATELY FOLLOWING A MEAL.  Currently taking 50mg  tab per his PC (Patient taking differently: Take 50 mg by mouth every morning. TAKE HALF TABLET IMMEDIATELY FOLLOWING A MEAL.  Currently taking 50mg  tab per his PC) 90 tablet 3  . naproxen sodium (ANAPROX) 220 MG tablet Take 220 mg by mouth 1 day or 1 dose. Reported on 07/27/2015    . omeprazole (PRILOSEC) 40 MG capsule Take 1  capsule (40 mg total) by mouth daily. 90 capsule 3  . sildenafil (VIAGRA) 100 MG tablet Take 100 mg by mouth daily as needed for erectile dysfunction.    . sodium chloride (OCEAN) 0.65 % nasal spray Place 2 sprays into the nose as needed.     . sodium fluoride (DENTA 5000 PLUS) 1.1 % CREA dental cream Apply a thin ribbon of gel to tooth brush. Brush teeth for 2 minutes. Spit out excess. Repeat nightly. 51 g 0  . tadalafil (CIALIS) 20 MG tablet Take 20 mg by mouth daily as needed for erectile dysfunction.    . triamcinolone cream (KENALOG) 0.1 % APPLY TO AFFECTED AREA TWICE DAILY 80 g 2   No current facility-administered medications on file prior to visit.    BP 128/72 mmHg  Pulse 84  Temp(Src) 99 F (37.2 C) (Oral)  Resp 18  Ht 5' 4.5" (1.638 m)  Wt 145 lb (65.772 kg)  BMI 24.51 kg/m2  SpO2 95%      Review of Systems  Constitutional: Positive for fatigue. Negative for fever, chills and appetite change.  HENT: Negative for congestion, dental problem, ear pain, hearing loss, sore throat, tinnitus, trouble swallowing and voice change.   Eyes: Negative for pain, discharge and visual disturbance.  Respiratory: Negative for cough, chest tightness, wheezing and stridor.   Cardiovascular: Positive for leg swelling. Negative for chest pain and palpitations.  Gastrointestinal: Negative for nausea, vomiting, abdominal pain, diarrhea, constipation, blood in stool and abdominal distention.  Genitourinary: Negative  for urgency, hematuria, flank pain, discharge, difficulty urinating and genital sores.  Musculoskeletal: Positive for back pain and gait problem. Negative for myalgias, joint swelling, arthralgias and neck stiffness.  Skin: Negative for rash.  Neurological: Positive for weakness. Negative for dizziness, syncope, speech difficulty, numbness and headaches.  Hematological: Negative for adenopathy. Does not bruise/bleed easily.  Psychiatric/Behavioral: Negative for behavioral problems and dysphoric mood. The patient is not nervous/anxious.        Objective:   Physical Exam  Constitutional: He is oriented to person, place, and time. He appears well-developed.  HENT:  Head: Normocephalic.  Right Ear: External ear normal.  Left Ear: External ear normal.  Eyes: Conjunctivae and EOM are normal.  Neck: Normal range of motion.  Cardiovascular: Normal rate and normal heart sounds.   Pulmonary/Chest: Breath sounds normal.  Abdominal: Bowel sounds are normal.  Musculoskeletal: Normal range of motion. He exhibits edema. He exhibits no tenderness.  Plus 1 edema only  Neurological: He is alert and oriented to person, place, and time.  Psychiatric: He has a normal mood and affect. His behavior is normal.          Assessment & Plan:   Essential hypertension History drug-induced hypokalemia.  Patient has been on potassium supplements and furosemide discontinued for the past 3 weeks Diet-controlled diabetes  We'll continue to hold furosemide Follow-up 3 months Oncology follow-up as scheduled

## 2015-11-05 NOTE — Patient Instructions (Signed)
Limit your sodium (Salt) intake  Return in 4 months for follow-up  Please check your blood pressure on a regular basis.  If it is consistently greater than 150/90, please make an office appointment.  Oncology follow-up as scheduled  Furosemide 40 mg- take only as needed for swelling of the legs.    Potassium chloride.  Take with each dose of furosemide only as needed

## 2015-11-20 ENCOUNTER — Other Ambulatory Visit: Payer: Self-pay | Admitting: Internal Medicine

## 2015-11-22 DIAGNOSIS — M47816 Spondylosis without myelopathy or radiculopathy, lumbar region: Secondary | ICD-10-CM | POA: Diagnosis not present

## 2015-11-22 DIAGNOSIS — M5136 Other intervertebral disc degeneration, lumbar region: Secondary | ICD-10-CM | POA: Diagnosis not present

## 2015-11-22 DIAGNOSIS — M961 Postlaminectomy syndrome, not elsewhere classified: Secondary | ICD-10-CM | POA: Diagnosis not present

## 2015-12-04 ENCOUNTER — Encounter: Payer: Self-pay | Admitting: Internal Medicine

## 2015-12-04 ENCOUNTER — Ambulatory Visit (INDEPENDENT_AMBULATORY_CARE_PROVIDER_SITE_OTHER): Payer: Medicare Other | Admitting: Internal Medicine

## 2015-12-04 VITALS — BP 112/78 | HR 78 | Temp 98.1°F | Ht 64.5 in | Wt 145.0 lb

## 2015-12-04 DIAGNOSIS — E119 Type 2 diabetes mellitus without complications: Secondary | ICD-10-CM | POA: Diagnosis not present

## 2015-12-04 DIAGNOSIS — C148 Malignant neoplasm of overlapping sites of lip, oral cavity and pharynx: Secondary | ICD-10-CM | POA: Diagnosis not present

## 2015-12-04 DIAGNOSIS — C9110 Chronic lymphocytic leukemia of B-cell type not having achieved remission: Secondary | ICD-10-CM

## 2015-12-04 DIAGNOSIS — I10 Essential (primary) hypertension: Secondary | ICD-10-CM

## 2015-12-04 NOTE — Progress Notes (Signed)
Subjective:    Patient ID: Timothy Kim, male    DOB: [birth year], 82 y.o.   MRN: [id number]  HPI  Lab Results  Component Value Date   HGBA1C 5.3 09/18/2015    BP Readings from Last 3 Encounters:  12/04/15 112/78  11/05/15 128/72  10/16/15 34/68    82 year old patient who is followed closely by oncology with CLL as well as cancer of the oral cavity.  He has essential hypertension and type 2 diabetes, diet controlled Doing recently well. He has had worsening lower extremity edema since his last visit here at the present time.  He is on Lasix 20 mg Monday, Wednesday and Friday only diuretic therapy has been complicated by hypokalemia.  Denies any shortness of breath  Wt Readings from Last 3 Encounters:  12/04/15 145 lb (65.772 kg)  11/05/15 145 lb (65.772 kg)  10/16/15 142 lb 3.2 oz (64.501 kg)    Past Medical History  Diagnosis Date  . Glaucoma     both eyes  . Allergic rhinitis   . BPH (benign prostatic hypertrophy)   . Organic impotence   . History of radiation to head and neck region 09-04-2014 to  10-13-2014    right retromolar region and right neck  60 Gy  . Venous insufficiency   . Pulmonary nodule     benign  and stable per last CT  . Hypertension   . OA (osteoarthritis)   . Dysphasia functional--  speech therapy    post op  radical neck dissection 07-21-2014--  changed diet to no chewy food , softer foods , states with the changes swallowed okay without any issues  . DDD (degenerative disc disease), cervical   . 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 Radiation-induced dermatitis   . Bilateral lower extremity edema     feet  . Macular degeneration     right eye only  . History of tracheostomy [facility])     post op radical neck dissection  07-21-2014--  post op tracheostomy on  07-25-2014  decannulated and sutured 07-29-2014  . Alcohol abuse   . Borderline diabetes   . History of gastroesophageal reflux (GERD)   . DDD (degenerative disc disease), lumbosacral   . Nephrolithiasis     right side--  per CT from [facility] 01-17-2015 non-obstructing  . Anemia in neoplastic disease     chronic  . History of radiation therapy     09-04-2014 to 10-13-2014  Right retromolar region (tumor bed) and right neck/  60Gy in 30 fractions to tumor bed and 54Gy in 30 fractions to neck  . CLL (chronic lymphocytic leukemia) [facility]) oncologist-  dr Heath Lark ([facility])    dx Jan 2014 --- Stage 1--  currently asymptomatic as of Apr 2016 and No active disease  . 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 Squamous cell carcinoma of retromolar trigone [facility]) oncologist-  dr Isidore Moos    Stage IVB, pT1b, pN0, Grade 2, +PNI, no LVSI---  S/P  RADICAL NECK DISSECTION AND RADIATION  . Transitional cell carcinoma of bladder [facility]) urologist-- dr Milana Na - grade     Social History   Social History  . Marital Status: Single    Spouse Name: N/A  .  Number of Children: 2  . Years of Education: N/A   Occupational History  . Not on file.   Social History Main Topics  . Smoking status: Never Smoker   . Smokeless tobacco: Never Used  . Alcohol Use: 16.8 oz/week    28 Shots of liquor per week     Comment: Gin and vodka 3-4 daily   (Hx alcohol withdrawal )  . Drug Use: No  . Sexual Activity: Not on file   Other Topics Concern  . Not on file   Social History Narrative   Patient is divorced with 2 children.   Patient has never smoked. Patient has never used smokeless tobacco.    Patient drinks gin or vodka on a daily basis.    Past Surgical History  Procedure Laterality Date  . Rotator cuff repair Right 12-11-2000  . Tonsil biopsy Right 05/24/14  . Maxillectomy Right 07/21/14,   [facility]    Radical Neck Dissection,  Maxillectomy, Right Marginal Mandibulectomy, dental extractions, Parascauplar fasciocutaeous Free flap reconstruction  . Laminectomy with posterior lateral arthrodesis level 1  09-01-2003    L3 -5 LAMINECTOMY W/ DECOMPRESSION AND FUSION L4-5  . Cataract extraction w/ intraocular lens  implant, bilateral    . Retinal detachment repair w/ scleral buckle le  11-03-2000  . Inguinal hernia repair Bilateral right 1988//  left 1970's  . Transurethral resection of bladder tumor N/A 02/02/2015    Procedure: TRANSURETHRAL RESECTION OF BLADDER TUMOR (TURBT);  Surgeon: Lowella Bandy, MD;  Location: [facility];  Service: Urology;  Laterality: N/A;  . Cystoscopy N/A 02/02/2015    Procedure: CYSTOSCOPY, INSTILLATION OF MITOMYCIN C;  Surgeon: Lowella Bandy, MD;  Location: [facility];  Service: Urology;  Laterality: N/A;  . Cystoscopy with biopsy N/A 03/27/2015    Procedure: CYSTOSCOPY WITH BLADDER  BIOPSY WITH FULGERATION;  Surgeon: Festus Aloe, MD;  [facility];  Service: Urology;  Laterality: N/A;    Family History  Problem Relation Age of Onset  . Cancer Mother     lung ca  . Alcohol abuse Sister   . Hypertension Neg Hx     family hx  . Sudden death Neg Hx     famiylhx    No Known Allergies  Current Outpatient Prescriptions on File Prior to Visit  Medication Sig Dispense Refill  . brimonidine-timolol (COMBIGAN) 0.2-0.5 % ophthalmic solution Apply 1 drop to eye 2 (two) times daily.     Marland Kitchen DHEA 50 MG TABS Take 1 tablet by mouth daily.    . fluticasone (FLONASE) 50 MCG/ACT nasal spray Place 2 sprays into both nostrils daily.  4  . Krill Oil 300 MG CAPS Take 300 mg by mouth daily.    Marland Kitchen latanoprost (XALATAN) 0.005 % ophthalmic solution Place 1 drop into both eyes every evening.   4  . Lutein-Zeaxanthin 15-0.7 MG CAPS Take 1 tablet by mouth daily.    . metoprolol succinate (TOPROL-XL) 100 MG 24 hr tablet TAKE HALF TABLET IMMEDIATELY FOLLOWING A MEAL.   Currently taking 50mg  tab per his PC (Patient taking differently: Take 50 mg by mouth every morning. TAKE HALF TABLET IMMEDIATELY FOLLOWING A MEAL.  Currently taking 50mg  tab per his PC) 90 tablet 3  . naproxen sodium (ANAPROX) 220 MG tablet Take 220 mg by mouth 1 day or 1 dose. Reported on 07/27/2015    . omeprazole (PRILOSEC) 40 MG capsule Take 1 capsule (40 mg total) by mouth daily. 90 capsule 3  .  potassium chloride SA (K-DUR,KLOR-CON) 20 MEQ tablet Take 1 tablet (20 mEq total) by mouth daily. 30 tablet 3  . sildenafil (VIAGRA) 100 MG tablet Take 100 mg by mouth daily as needed for erectile dysfunction.    . sodium chloride (OCEAN) 0.65 % nasal spray Place 2 sprays into the nose as needed.     . sodium fluoride (DENTA 5000 PLUS) 1.1 % CREA dental cream Apply a thin ribbon of gel to tooth brush. Brush teeth for 2 minutes. Spit out excess. Repeat nightly. 51 g 0  . tadalafil (CIALIS) 20 MG tablet Take 20 mg by mouth daily as needed for erectile dysfunction.    . triamcinolone cream (KENALOG) 0.1 % APPLY TO AFFECTED AREA TWICE DAILY 80 g 2  . zolpidem (AMBIEN) 5 MG tablet TAKE 1 TABLET BY MOUTH AT BEDTIME AS NEEDED FOR SLEEP 30 tablet 2   No current facility-administered medications on file prior to visit.    BP 112/78 mmHg  Pulse 78  Temp(Src) 98.1 F (36.7 C) (Oral)  Ht 5' 4.5" (1.638 m)  Wt 145 lb (65.772 kg)  BMI 24.51 kg/m2  SpO2 97%     Review of Systems  Constitutional: Negative for fever, chills, appetite change and fatigue.  HENT: Negative for congestion, dental problem, ear pain, hearing loss, sore throat, tinnitus, trouble swallowing and voice change.   Eyes: Negative for pain, discharge and visual disturbance.  Respiratory: Negative for cough, chest tightness, wheezing and stridor.   Cardiovascular: Positive for leg swelling. Negative for chest pain and palpitations.  Gastrointestinal: Negative for nausea, vomiting, abdominal pain, diarrhea, constipation, blood in stool and  abdominal distention.  Genitourinary: Negative for urgency, hematuria, flank pain, discharge, difficulty urinating and genital sores.  Musculoskeletal: Negative for myalgias, back pain, joint swelling, arthralgias, gait problem and neck stiffness.  Skin: Negative for rash.  Neurological: Positive for weakness. Negative for dizziness, syncope, speech difficulty, numbness and headaches.  Hematological: Negative for adenopathy. Does not bruise/bleed easily.  Psychiatric/Behavioral: Negative for behavioral problems and dysphoric mood. The patient is not nervous/anxious.        Objective:   Physical Exam  Constitutional: He is oriented to person, place, and time. He appears well-developed.  HENT:  Head: Normocephalic.  Right Ear: External ear normal.  Left Ear: External ear normal.  Prominent soft tissue swelling right facial area  Eyes: Conjunctivae and EOM are normal.  Neck: Normal range of motion.  Cardiovascular: Normal rate and normal heart sounds.   Pulmonary/Chest: Breath sounds normal.  Abdominal: Bowel sounds are normal.  Musculoskeletal: Normal range of motion. He exhibits edema. He exhibits no tenderness.  Plus 2 ankle and peel edema  Neurological: He is alert and oriented to person, place, and time.  Psychiatric: He has a normal mood and affect. His behavior is normal.          Assessment & Plan:   Essential hypertension Diet-controlled diabetes Worsening pedal edema.  Will continue salt restriction and careful diuretic use  Oncology follow-up  Return here in 3 6 months  [name], MD

## 2015-12-04 NOTE — Progress Notes (Signed)
Pre visit review using our clinic review tool, if applicable. No additional management support is needed unless otherwise documented below in the visit note. 

## 2015-12-04 NOTE — Patient Instructions (Signed)
Limit your sodium (Salt) intake  Return in 6 months for follow-up  Furosemide 20 mg Monday, Wednesday, Friday

## 2015-12-22 DIAGNOSIS — J189 Pneumonia, unspecified organism: Secondary | ICD-10-CM

## 2015-12-22 HISTORY — DX: Pneumonia, unspecified organism: J18.9

## 2016-01-04 ENCOUNTER — Ambulatory Visit (HOSPITAL_BASED_OUTPATIENT_CLINIC_OR_DEPARTMENT_OTHER): Payer: Medicare Other | Admitting: Adult Health

## 2016-01-04 VITALS — BP 129/77 | HR 62 | Temp 97.9°F | Resp 16 | Ht 64.5 in | Wt 143.0 lb

## 2016-01-04 DIAGNOSIS — Z8551 Personal history of malignant neoplasm of bladder: Secondary | ICD-10-CM

## 2016-01-04 DIAGNOSIS — C9110 Chronic lymphocytic leukemia of B-cell type not having achieved remission: Secondary | ICD-10-CM

## 2016-01-04 DIAGNOSIS — Z85818 Personal history of malignant neoplasm of other sites of lip, oral cavity, and pharynx: Secondary | ICD-10-CM

## 2016-01-04 DIAGNOSIS — C062 Malignant neoplasm of retromolar area: Secondary | ICD-10-CM

## 2016-01-04 NOTE — Progress Notes (Signed)
CLINIC:  Survivorship  REASON FOR VISIT:  Routine follow-up for head & neck cancer & to address post-treatment acute survivorship needs.   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 Biopsy was performed on the right tonsil   05/24/2014 Pathology Results Accession: [id number] right tonsil biopsy confirmed squamous cell carcinoma.   06/05/2014 Imaging CT scan showed retromolar mass invading into the alveolar ridge   06/06/2014 PET scan PET CT showed no evidence of metastatic disease   06/07/2014 Initial Diagnosis Squamous cell carcinoma of retromolar trigone (Timothy Kim)   07/24/2014 Surgery He had extensive surgery at [facility] including Tracheotomy, R SND (levels 1-3), right infrastructure maxillectomy with right rim mandibulectomy, dental extractions, right parascapular fasciocutaneous free flap and LN dissection.   09/04/2014 - 10/13/2014 Radiation Therapy Rec'd Helical IMRT to right retromolar region (tumor bed) and right neck: 60 Gy in 30 fractions to tumor bed and 54 Gy in 30 fractions to neck.   01/04/2015 Imaging CT Neck:  No residual oral cavity tumor or lymphadenopathy identified.                              01/04/2015 Imaging CT Chest:  No evidence of metastatic disease.   Pulmonary nodule in right base has remained stable since 2004 and is compatible with a benign process.     History of bladder cancer   02/02/2015 Surgery He underwent TURBT   02/02/2015 Pathology Results Accession: [id number] TURBT result  showed high grade urothelial cancer   03/27/2015 Surgery He had Cystoscopy, bladder biopsy and fulguration, 2 - 5 cm   03/27/2015 Pathology Results Accession: [id number] pathology was negative for residual disease     INTERVAL HISTORY:  Timothy Kim presents to survivorship for routine follow-up after completing treatment squamous cell carcinoma of the retromolar trigone.  His biggest complaint today is orthopedic issues; he has been diagnosed with degenerative disc disease in his back and this limits his mobility which is frustrating for him; he ambulates with a cane "as a security blanket."  He sees Dr. Nelva Kim at [facility] for this and [name] tells me they are considering injections for him.  He also has a torn (R) rotator cuff which also limits his ability to do some of the things he loves, like trimming shrubs/working in the yard.  Regarding his H&N cancer, he last saw Timothy Kim at [facility] was in [place] 2016.  His appetite is okay, but he sees food as "more of a chore" rather than for enjoyment.  He is currently on Prednisone 10mg  daily, but is not sure what it is for and who prescribed it. His taste is good.  He has dry mouth.  He feels like his swallowing is fine, without any choking sensation, with the exception of his vitamins that he feels "get hung up in my throat sometimes."  He is struggling with recurrent sinusitis; reports that he recently saw Dr. Erik Kim who recommended that he come back to  see him soon if it did not resolve; it has been going on for about 1 month with associated dizziness.    Pain: Back/orthopedic pain   Nutritional Status:  -Intake/Diet: Regular diet with extra protein; does not drink much Boost or Ensure now  -Using a feeding tube? No -Weight change: GAIN ~1 lb since   Swallowing:  No complaints; has to drink from straw due to reconstruction surgery.   Last ENT visit: Timothy Kim at [facility]; "sometime in November or December of last year"   Last Dentist visit:   "I haven't been in a long time, probably 03/2014."  Using fluoride toothpaste daily; not using fluoride trays.   Summary of last Med Onc visit: 10/16/15-Timothy Kim-will continue to follow him for CLL; Timothy to follow him for h/o bladder cancer; rad onc and ENT to follow him for H&N cancer.   Summary of last Rad Onc visit:  07/27/15-Timothy Kim-encouraged him to make appt with dentist; continue seeing SLP; recommended [place] visit with specialists  Last TSH value: TSH 1.694 on 10/16/15    ADDITIONAL REVIEW OF SYSTEMS:  Review of Systems  Constitutional: Positive for malaise/fatigue.       Some fatigue   Musculoskeletal: Positive for back pain and joint pain.       DJD, seeing orthopedics; also with rotator cuff tear Ambulates with cane  Psychiatric/Behavioral: Positive for depression.       Feeling sad/down at times; still able to enjoy some of the things he enjoys doing (sitting outside, watering his plants, spending time with his wife, talking to his brother on the phone).      CURRENT MEDICATIONS:  Current Outpatient Prescriptions on File Prior to Visit  Medication Sig Dispense Refill  . brimonidine-timolol (COMBIGAN) 0.2-0.5 % ophthalmic solution Apply 1 drop to eye 2 (two) times daily.     Marland Kitchen DHEA 50 MG TABS Take 1 tablet by mouth daily.    . fluticasone (FLONASE) 50 MCG/ACT nasal spray Place 2 sprays into both nostrils daily.  4  . furosemide (LASIX) 20 MG tablet     . Krill Oil 300 MG CAPS Take 300 mg by mouth daily.    Marland Kitchen latanoprost (XALATAN) 0.005 % ophthalmic solution Place 1 drop into both eyes every evening.   4  . Lutein-Zeaxanthin 15-0.7 MG CAPS Take 1 tablet by mouth daily.    . metoprolol succinate (TOPROL-XL) 100 MG 24 hr tablet TAKE HALF TABLET IMMEDIATELY FOLLOWING A MEAL.  Currently taking 50mg  tab per his PC (Patient taking differently: Take 50 mg by mouth every morning. TAKE HALF TABLET IMMEDIATELY FOLLOWING A MEAL.  Currently taking 50mg  tab per his PC) 90 tablet 3  . naproxen  sodium (ANAPROX) 220 MG tablet Take 220 mg by mouth 1 day or 1 dose. Reported on 07/27/2015    . omeprazole (PRILOSEC) 40 MG capsule Take 1 capsule (40 mg total) by mouth daily. 90 capsule 3  . sodium chloride (OCEAN) 0.65 % nasal spray Place 2 sprays into the nose as needed.     . sodium fluoride (DENTA 5000 PLUS) 1.1 % CREA dental cream Apply a thin ribbon of gel to tooth brush. Brush teeth for 2 minutes. Spit out excess. Repeat nightly. 51 g 0  . triamcinolone cream (KENALOG) 0.1 % APPLY TO AFFECTED AREA TWICE DAILY 80 g 2  . potassium chloride SA (K-DUR,KLOR-CON) 20 MEQ tablet Take 1 tablet (20 mEq total) by mouth daily. 30 tablet 3  . predniSONE (DELTASONE) 10 MG tablet TAKE AS DIRECTED  BY MOUTH EVERY DAY **6-5-4-3-2-1**  0  . sildenafil (VIAGRA) 100 MG tablet Take 100 mg by mouth daily as needed for erectile dysfunction. Reported on 01/04/2016    . tadalafil (CIALIS) 20 MG tablet Take 20 mg by mouth daily as needed for erectile dysfunction. Reported on 01/04/2016    . zolpidem (AMBIEN) 5 MG tablet TAKE 1 TABLET BY MOUTH AT BEDTIME AS NEEDED FOR SLEEP (Patient not taking: Reported on 01/04/2016) 30 tablet 2   No current facility-administered medications on file prior to visit.    ALLERGIES:  No Known Allergies   PHYSICAL EXAM:  Filed Vitals:   01/04/16 1136  BP: 129/77  Pulse: 62  Temp: 97.9 F (36.6 C)  Resp: 16    Weight Date  143 lb (64.864 kg) 01/04/16  142 lb 3.2 oz (64.501 kg) 10/16/15  141 lb 1.6 oz (64.003 kg) 07/27/15  145 lb (65.772 kg) 03/16/15  151 lb 14.4 oz (68.901 kg) 01/05/15  153 lb 8 oz (69.627 kg) 10/16/14 (after treatment)  166 lb (75.297 kg) Pre-treatment: 06/07/14   General: Chronically-ill appearing gentleman in no acute distress.  Accompanied by his wife today.  HEENT: Head is normocephalic.  Pupils equal and reactive to light. Conjunctivae clear without exudate.  Sclerae anicteric. (+) nasal drainage. Oral mucosa is pink and slightly dry.  Tongue pink and  slightly deviated given previous surgeries; there is a very small ulceration on the central part of his tongue that looks like healing mucositis vs normal radiation changes.  Buccal mucosa without lesions; no floor of mouth lesions.  Oropharynx is mildly red, likely related to post-nasal drip and post-radiation changes.    Lymph: No preauricular, postauricular, supraclavicular, or infraclavicular lymphadenopathy noted on palpation. There is (+) facial, submental, and anterior neck lymphedema.   Neck: No palpable masses. Fibrosis to right neck given surgeries.  Cardiovascular: Normal rate and rhythm. Respiratory: Clear to auscultation bilaterally. Chest expansion symmetric; breathing non-labored. GI: Abdomen soft and round. No tenderness to palpation. Bowel sounds normoactive. GU: Deferred.   Neuro: No focal deficits. Ambulates with cane.  Psych: Normal mood and affect for situation. Extremities: 1+ bilat lower extremity edema (recently started on Lasix by PCP).  Skin: Warm and dry.    LABORATORY DATA:  None at this visit.  DIAGNOSTIC IMAGING:  None at this visit.    ASSESSMENT & PLAN:  Mr. Merkerson is a pleasant 82 y.o. 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  Patient presents to survivorship clinic today for routine follow-up after finishing treatment.   1. Cancer of right retromolar trigone:  There is no evidence suggestive of recurrence on physical exam today.  He should see Dr. Isidore Moos in late September/early October 2017 for continued surveillance.  He is supposed to see Timothy Kim in 05/2016 at [facility].    2. Facial/neck lymphedema: I encouraged him to use his compression garment and to perform manual lymph massage as directed by his physical therapist.  He states that he fell asleep with the compression garment on and woke up in a panic because it made him feel  claustrophobic. I encouraged him to try to use the garment during the day instead, as well as practice the massage techniques to see if the swelling may diminish.  He understands that this will likely be a chronic issue for him and prevention of lymphedema from getting worse is important.  3. Xerostomia:  Encouraged him to try some of the Biotene products, including the spray and gel, as patients have found these products very helpful.  We also discussed Salogen as an option, but there are often adverse side effects or it may offer him no benefit at all.  The response of Salogen with H&N patients has been mixed in my experience. He will let me know if he wants to try it in the future.  For now, he is going to manage it conservatively with OTC products for now.   4. Chronic sinusitis: Timothy Kim appears to continue to have sinusitis. He may need antibiotic therapy at this time.  Given that the patient was previously evaluated by Dr. Erik Kim at [facility] and encouraged to return there if his symptoms persisted, I will defer to Dr. Erik Kim to manage the patient's sinus issues.  I will ask Gayleen Orem, RN-H&N Navigator to assist with getting the patient in to see Dr. Erik Kim in the next 0000000 days or sooner if possible.    5. Steroid use: I am not sure why Mr. Messina is on prednisone 10mg  daily. It appears that it was ordered by his PCP, with notation that appeared to be a tapering schedule.  I encouraged Mr. Goostree and his wife to call Dr. Burnice Logan to inquire about what he should do about the steroids.  I advised him not to stop taking the steroids "cold [place]" as there are potential serious complications with doing so.  He will call his PCP to get his questions answered regarding the current steroid use.    6. Adjustment disorder with depressed mood:  It is not uncommon for this period of the patient's cancer care trajectory to be one of many emotions and stressors.  He was supported today with  validation and normalization of his concerns as well as expressive supportive counseling.  I do not think he is clinically depressed, although he is certainly at high-risk given his multiple medical conditions, including several cancer diagnoses.  He is still able to enjoy some of the things he used to enjoy and is looking forward to several events in the future, namely a visit to see his brother in [place], [place] this Fall as well as an upcoming trip to his [place] home in [place] with his wife.  He understands that should he begin to experience "more sad than happy days", that he may need medical intervention for depression.  We will continue to monitor him in the coming months.   7. At risk for dental caries: After treatment with radiation for head & neck cancers, patients often experience xerostomia which increases their risk of dental caries. Mr. Nothdurft reports not having seen a dentist since 03/2014.  I strongly advised him to see his primary care dentist for a cleaning as soon as he is able.  I explained the rationale and importance of frequent evaluation by dentistry given his history of H&N cancer.  I also reiterated that should he need any type of invasive dental procedures in the future, that his primary dentist should consult with Dr. Enrique Sack about the safest approach given his history of H&N cancer with radiation therapy.  He voiced understanding and agrees with this plan.   8. At risk for hypothyroidism: The thyroid gland is often affected after treatment for head & neck cancer. His most recent TSH was normal in 09/2015 at 1.694.  He will continue to have serial TSH monitoring for at least the next 5 years  as part of his routine follow-up and post-cancer treatment care. I reiterated that should he need thyroid supplementation in the future, we would provide that for him.      Dispo:  -See Dr. Erik Kim ASAP for continued sinusitis/dizziness complaints.  -Return to cancer center to see  Dr. Isidore Moos in late Sept/early Oct 2017 -See Timothy [facility] at [facility] in 05/2016.  -Return to survivorship clinic as deemed appropriate by Dr. Isidore Moos. -See Dr. Alvy Bimler in 09/2016 for h/o CLL.     A total of 35 minutes was spent in the face-to-face care of this patient, with greater than 50% of that time spent in counseling and care-coordination.   Mike Craze, NP [place] [phone]

## 2016-01-04 NOTE — Patient Instructions (Signed)
Thank you for coming in to see me today. I enjoyed meeting you!    You can try to Biotene spray or gel for the dry mouth.  Keep up the great work with your nutrition.  I will work on getting you an appointment with Dr. Noreene Filbert office regarding your sinus issues within the next week (if he is available) We will work on having you see Dr. Isidore Moos sometime in late September/early October.   *Please call your Primary Care Doctor about the prednisone. I am not sure why you are taking this medication, but it should not be stopped abruptly due to the potential for severe side effects.    Feel free to call me with any questions or concerns.   Mike Craze, NP [place] [phone]

## 2016-01-08 DIAGNOSIS — H35362 Drusen (degenerative) of macula, left eye: Secondary | ICD-10-CM | POA: Diagnosis not present

## 2016-01-08 DIAGNOSIS — H353133 Nonexudative age-related macular degeneration, bilateral, advanced atrophic without subfoveal involvement: Secondary | ICD-10-CM | POA: Diagnosis not present

## 2016-01-08 DIAGNOSIS — H353212 Exudative age-related macular degeneration, right eye, with inactive choroidal neovascularization: Secondary | ICD-10-CM | POA: Diagnosis not present

## 2016-01-08 DIAGNOSIS — H43811 Vitreous degeneration, right eye: Secondary | ICD-10-CM | POA: Diagnosis not present

## 2016-01-10 ENCOUNTER — Telehealth: Payer: Self-pay | Admitting: *Deleted

## 2016-01-10 NOTE — Telephone Encounter (Signed)
  Oncology Nurse Navigator Documentation  Navigator Location: [facility]-Med Onc (01/10/16 1615) Navigator Encounter Type: Telephone (01/10/16 1615)               Barriers/Navigation Needs: Coordination of Care (01/10/16 1615)       Per guidance from Survivorship NP Mike Craze, called [facility] to arrange appt in next several days with Dr. Shirlee Limerick to address ongoing sinusitis.  Spoke with [place], she voiced understanding.  Gayleen Orem, RN, BSN, [place] [facility] [place] [phone]                       Time Spent with Patient: 15 (01/10/16 1615)

## 2016-01-16 ENCOUNTER — Telehealth: Payer: Self-pay | Admitting: Adult Health

## 2016-01-16 DIAGNOSIS — C672 Malignant neoplasm of lateral wall of bladder: Secondary | ICD-10-CM | POA: Diagnosis not present

## 2016-01-16 NOTE — Telephone Encounter (Signed)
I received a voicemail from [name]'s wife stating that she had not heard from Dr. Pearlie Oyster office regarding her husband's follow-up appt.    I sent an in-basket message to Timothy Kim in Timothy Kim to assist with getting this patient scheduled to see Dr. Isidore Moos for follow-up sometime in late Sept/early Oct 2017.  Timothy Craze, NP [place] [phone]

## 2016-01-17 ENCOUNTER — Telehealth: Payer: Self-pay | Admitting: Adult Health

## 2016-01-17 NOTE — Telephone Encounter (Signed)
I received a voicemail from Mrs. Fouch again regarding the f/u appt to be scheduled with Dr. Isidore Moos.   I returned their phone call and was able to speak with the patient.  I relayed that the appt has been scheduled for them to see Dr. Isidore Moos on Friday, 03/21/16 at 10:40 am.  They voiced understanding of this appointment and read back the date/time for verification.  I encouraged them to call me with any other questions.   Mike Craze, NP [place] [phone]

## 2016-02-22 ENCOUNTER — Other Ambulatory Visit: Payer: Self-pay | Admitting: Internal Medicine

## 2016-02-25 ENCOUNTER — Other Ambulatory Visit: Payer: Self-pay | Admitting: Internal Medicine

## 2016-03-03 ENCOUNTER — Telehealth: Payer: Self-pay

## 2016-03-03 DIAGNOSIS — I872 Venous insufficiency (chronic) (peripheral): Secondary | ICD-10-CM | POA: Diagnosis not present

## 2016-03-03 DIAGNOSIS — L570 Actinic keratosis: Secondary | ICD-10-CM | POA: Diagnosis not present

## 2016-03-03 NOTE — Telephone Encounter (Signed)
Received PA Request from [facility] for Zolpidem 5 mg. PA submitted & was approved through 9.11.18. Form faxed back to pharmacy.

## 2016-03-04 DIAGNOSIS — M47816 Spondylosis without myelopathy or radiculopathy, lumbar region: Secondary | ICD-10-CM | POA: Diagnosis not present

## 2016-03-11 DIAGNOSIS — H9192 Unspecified hearing loss, left ear: Secondary | ICD-10-CM | POA: Diagnosis not present

## 2016-03-11 DIAGNOSIS — E039 Hypothyroidism, unspecified: Secondary | ICD-10-CM | POA: Diagnosis not present

## 2016-03-11 DIAGNOSIS — H9113 Presbycusis, bilateral: Secondary | ICD-10-CM | POA: Insufficient documentation

## 2016-03-11 DIAGNOSIS — H6981 Other specified disorders of Eustachian tube, right ear: Secondary | ICD-10-CM | POA: Diagnosis not present

## 2016-03-11 DIAGNOSIS — H90A11 Conductive hearing loss, unilateral, right ear with restricted hearing on the contralateral side: Secondary | ICD-10-CM | POA: Diagnosis not present

## 2016-03-11 DIAGNOSIS — J329 Chronic sinusitis, unspecified: Secondary | ICD-10-CM | POA: Diagnosis not present

## 2016-03-11 DIAGNOSIS — R220 Localized swelling, mass and lump, head: Secondary | ICD-10-CM | POA: Diagnosis not present

## 2016-03-14 DIAGNOSIS — E039 Hypothyroidism, unspecified: Secondary | ICD-10-CM | POA: Diagnosis not present

## 2016-03-18 ENCOUNTER — Encounter (HOSPITAL_COMMUNITY): Payer: Self-pay

## 2016-03-18 ENCOUNTER — Emergency Department (HOSPITAL_COMMUNITY): Payer: Medicare Other

## 2016-03-18 ENCOUNTER — Inpatient Hospital Stay (HOSPITAL_COMMUNITY)
Admission: EM | Admit: 2016-03-18 | Discharge: 2016-03-22 | DRG: 195 | Disposition: A | Discharge status: Home Health | Payer: Medicare Other | Attending: Internal Medicine | Admitting: Internal Medicine

## 2016-03-18 DIAGNOSIS — R001 Bradycardia, unspecified: Secondary | ICD-10-CM | POA: Diagnosis not present

## 2016-03-18 DIAGNOSIS — I10 Essential (primary) hypertension: Secondary | ICD-10-CM | POA: Diagnosis not present

## 2016-03-18 DIAGNOSIS — I712 Thoracic aortic aneurysm, without rupture: Secondary | ICD-10-CM | POA: Diagnosis not present

## 2016-03-18 DIAGNOSIS — S0990XA Unspecified injury of head, initial encounter: Secondary | ICD-10-CM | POA: Diagnosis not present

## 2016-03-18 DIAGNOSIS — M25522 Pain in left elbow: Secondary | ICD-10-CM | POA: Diagnosis not present

## 2016-03-18 DIAGNOSIS — Z85820 Personal history of malignant melanoma of skin: Secondary | ICD-10-CM

## 2016-03-18 DIAGNOSIS — S8992XA Unspecified injury of left lower leg, initial encounter: Secondary | ICD-10-CM | POA: Diagnosis not present

## 2016-03-18 DIAGNOSIS — D539 Nutritional anemia, unspecified: Secondary | ICD-10-CM | POA: Diagnosis present

## 2016-03-18 DIAGNOSIS — C148 Malignant neoplasm of overlapping sites of lip, oral cavity and pharynx: Secondary | ICD-10-CM | POA: Diagnosis present

## 2016-03-18 DIAGNOSIS — D630 Anemia in neoplastic disease: Secondary | ICD-10-CM | POA: Diagnosis present

## 2016-03-18 DIAGNOSIS — C062 Malignant neoplasm of retromolar area: Secondary | ICD-10-CM | POA: Diagnosis not present

## 2016-03-18 DIAGNOSIS — S59902A Unspecified injury of left elbow, initial encounter: Secondary | ICD-10-CM | POA: Diagnosis not present

## 2016-03-18 DIAGNOSIS — R404 Transient alteration of awareness: Secondary | ICD-10-CM | POA: Diagnosis not present

## 2016-03-18 DIAGNOSIS — R55 Syncope and collapse: Secondary | ICD-10-CM

## 2016-03-18 DIAGNOSIS — R911 Solitary pulmonary nodule: Secondary | ICD-10-CM | POA: Diagnosis present

## 2016-03-18 DIAGNOSIS — S299XXA Unspecified injury of thorax, initial encounter: Secondary | ICD-10-CM | POA: Diagnosis not present

## 2016-03-18 DIAGNOSIS — I5189 Other ill-defined heart diseases: Secondary | ICD-10-CM | POA: Diagnosis present

## 2016-03-18 DIAGNOSIS — L589 Radiodermatitis, unspecified: Secondary | ICD-10-CM | POA: Diagnosis present

## 2016-03-18 DIAGNOSIS — Z923 Personal history of irradiation: Secondary | ICD-10-CM

## 2016-03-18 DIAGNOSIS — S8991XA Unspecified injury of right lower leg, initial encounter: Secondary | ICD-10-CM | POA: Diagnosis not present

## 2016-03-18 DIAGNOSIS — N400 Enlarged prostate without lower urinary tract symptoms: Secondary | ICD-10-CM | POA: Diagnosis present

## 2016-03-18 DIAGNOSIS — Z981 Arthrodesis status: Secondary | ICD-10-CM

## 2016-03-18 DIAGNOSIS — J189 Pneumonia, unspecified organism: Principal | ICD-10-CM

## 2016-03-18 DIAGNOSIS — H3530 Unspecified macular degeneration: Secondary | ICD-10-CM | POA: Diagnosis present

## 2016-03-18 DIAGNOSIS — E119 Type 2 diabetes mellitus without complications: Secondary | ICD-10-CM | POA: Diagnosis present

## 2016-03-18 DIAGNOSIS — R531 Weakness: Secondary | ICD-10-CM

## 2016-03-18 DIAGNOSIS — K219 Gastro-esophageal reflux disease without esophagitis: Secondary | ICD-10-CM | POA: Diagnosis present

## 2016-03-18 DIAGNOSIS — H4089 Other specified glaucoma: Secondary | ICD-10-CM | POA: Diagnosis present

## 2016-03-18 DIAGNOSIS — Z79899 Other long term (current) drug therapy: Secondary | ICD-10-CM

## 2016-03-18 DIAGNOSIS — Z8551 Personal history of malignant neoplasm of bladder: Secondary | ICD-10-CM

## 2016-03-18 DIAGNOSIS — Z801 Family history of malignant neoplasm of trachea, bronchus and lung: Secondary | ICD-10-CM

## 2016-03-18 DIAGNOSIS — W19XXXA Unspecified fall, initial encounter: Secondary | ICD-10-CM | POA: Diagnosis present

## 2016-03-18 HISTORY — DX: Carrier or suspected carrier of methicillin resistant Staphylococcus aureus: Z22.322

## 2016-03-18 LAB — I-STAT TROPONIN, ED
TROPONIN I, POC: 0.02 ng/mL (ref 0.00–0.08)
TROPONIN I, POC: 0.02 ng/mL (ref 0.00–0.08)

## 2016-03-18 LAB — COMPREHENSIVE METABOLIC PANEL
ALBUMIN: 2.7 g/dL — AB (ref 3.5–5.0)
ALK PHOS: 76 U/L (ref 38–126)
ALT: 26 U/L (ref 17–63)
AST: 50 U/L — AB (ref 15–41)
Anion gap: 9 (ref 5–15)
BILIRUBIN TOTAL: 1.6 mg/dL — AB (ref 0.3–1.2)
BUN: 11 mg/dL (ref 6–20)
CO2: 29 mmol/L (ref 22–32)
Calcium: 8.2 mg/dL — ABNORMAL LOW (ref 8.9–10.3)
Chloride: 96 mmol/L — ABNORMAL LOW (ref 101–111)
Creatinine, Ser: 0.68 mg/dL (ref 0.61–1.24)
GFR calc Af Amer: >60 mL/min (ref >60)
GFR calc non Af Amer: >60 mL/min (ref >60)
GLUCOSE: 113 mg/dL — AB (ref 65–99)
POTASSIUM: 3.6 mmol/L (ref 3.5–5.1)
Sodium: 134 mmol/L — ABNORMAL LOW (ref 135–145)
TOTAL PROTEIN: 5.5 g/dL — AB (ref 6.5–8.1)

## 2016-03-18 LAB — CBC WITH DIFFERENTIAL/PLATELET
BASOS ABS: 0 10*3/uL (ref 0.0–0.1)
BASOS PCT: 0 %
EOS PCT: 0 %
Eosinophils Absolute: 0 10*3/uL (ref 0.0–0.7)
HEMATOCRIT: 33.9 % — AB (ref 39.0–52.0)
HEMOGLOBIN: 12 g/dL — AB (ref 13.0–17.0)
LYMPHS PCT: 45 %
Lymphs Abs: 5.5 10*3/uL — ABNORMAL HIGH (ref 0.7–4.0)
MCH: 37.5 pg — ABNORMAL HIGH (ref 26.0–34.0)
MCHC: 35.4 g/dL (ref 30.0–36.0)
MCV: 105.9 fL — AB (ref 78.0–100.0)
MONO ABS: 0.6 10*3/uL (ref 0.1–1.0)
MONOS PCT: 5 %
Neutro Abs: 6.2 10*3/uL (ref 1.7–7.7)
Neutrophils Relative %: 50 %
Platelets: 216 10*3/uL (ref 150–400)
RBC: 3.2 MIL/uL — AB (ref 4.22–5.81)
RDW: 13.3 % (ref 11.5–15.5)
WBC: 12.4 10*3/uL — ABNORMAL HIGH (ref 4.0–10.5)

## 2016-03-18 LAB — BRAIN NATRIURETIC PEPTIDE: B Natriuretic Peptide: 295.7 pg/mL — ABNORMAL HIGH (ref 0.0–100.0)

## 2016-03-18 LAB — CBG MONITORING, ED: GLUCOSE-CAPILLARY: 148 mg/dL — AB (ref 65–99)

## 2016-03-18 LAB — D-DIMER, QUANTITATIVE (NOT AT ARMC): D DIMER QUANT: 1.4 ug{FEU}/mL — AB (ref 0.00–0.50)

## 2016-03-18 MED ORDER — ENSURE ENLIVE PO LIQD
237.0000 mL | Freq: Two times a day (BID) | ORAL | Status: DC
  Administered 2016-03-19 – 2016-03-21 (×4): 237 mL via ORAL

## 2016-03-18 MED ORDER — DEXTROSE 5 % IV SOLN
1.0000 g | Freq: Once | INTRAVENOUS | Status: AC
  Administered 2016-03-18: 1 g via INTRAVENOUS
  Filled 2016-03-18: qty 10

## 2016-03-18 MED ORDER — IOPAMIDOL (ISOVUE-370) INJECTION 76%
100.0000 mL | Freq: Once | INTRAVENOUS | Status: AC | PRN
  Administered 2016-03-18: 87.3 mL via INTRAVENOUS

## 2016-03-18 MED ORDER — DEXTROSE 5 % IV SOLN
500.0000 mg | Freq: Once | INTRAVENOUS | Status: AC
  Administered 2016-03-18: 500 mg via INTRAVENOUS
  Filled 2016-03-18: qty 500

## 2016-03-18 MED ORDER — ONDANSETRON HCL 4 MG/2ML IJ SOLN: 4.0000 mg | Freq: Three times a day (TID) | INTRAMUSCULAR | Status: DC | PRN

## 2016-03-18 MED ORDER — HYDROMORPHONE HCL 1 MG/ML IJ SOLN: 1.0000 mg | INTRAMUSCULAR | Status: DC | PRN

## 2016-03-18 NOTE — ED Notes (Signed)
Patient transported to X-ray 

## 2016-03-18 NOTE — ED Provider Notes (Signed)
[facility] DEPT Provider Note   CSN: [id number] Arrival date & time: 03/18/16  1549     History   Chief Complaint Chief Complaint  Patient presents with  . Fall  . Fatigue    HPI Timothy Kim is a 82 y.o. male is.  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 Patient states that back in July he was on his back porch when he had a syncopal episode. He states that he lost consciousness for only a few seconds. He fell backwards and hit his head. He was unable to get up by himself so he had to call his wife to help get him inside the house. They were able to get him up into a chair where he stayed the night. The next morning he woke up and felt like he had his strength back. Patient has had no problems up until this past Sunday. States he was on his porch and had another syncopal episode. He states he was only out for a few seconds. Denies being lightheaded or dizzy. Patient states that he fell to his knees but did not hit his head or neck. Patient states he was not able to get up and had to call his wife and help get him inside the house. Patient was able to get into a chair and has not been able to get up since then. States that he just feels weak. Patient also states that he's had a poor appetite for the past month. Patient called EMS today because he was unable to stand without getting weak and falling back down. Patient is alert abrasions noted to knees bilaterally and elbows bilaterally. Patient's wife bandaged these up at home. Bleeding was controlled after removal of Band-Aid in ED. When EMS went to pick patient up today patient scraped his right elbow on the coffee table and has abrasion. Bandage in place and  bleeding controlled. Patient denies any fever, chills, headache, neck pain, dizziness, lightheadedness, chest pain, shortness of breath, palpitations, abdominal pain, urinary symptoms, change in bowel habits, numbness or tingling. Patient states that he does have back pain which is baseline for him as he has a history of degenerative disc disease. Patient denies any pain at this time. He states he just feels weak.  Patient states that last week patient had followed up with patient's ENT surgeon and was started on antibiotics for possible MRSA/drug resistant sinusitis which patient is yet to pick up the medication from the pharmacy.   The history is provided by the patient and the spouse.    Past Medical History:  Diagnosis Date  . Alcohol abuse   . Allergic rhinitis   . Anemia in neoplastic disease    chronic  . Bilateral lower extremity edema    feet  . Borderline diabetes   . BPH (benign prostatic hypertrophy)   . CLL (chronic lymphocytic leukemia) [facility]) oncologist-  dr Heath Lark ([facility])   dx Jan 2014 --- Stage 1--  currently asymptomatic as of Apr 2016 and No active disease  . DDD (degenerative disc disease), cervical   . DDD (degenerative disc disease), lumbosacral   . Derangement of TMJ (temporomandibular joint) right side   post op radical neck dissection 07-21-2014--  misalignment right side mandible--  causes discomfort with chewy  food like steak--  changed diet to accommendate  . Dysphasia functional--  speech therapy   post op  radical neck dissection 07-21-2014--  changed diet to no chewy food , softer foods , states with the changes swallowed okay without any issues  . Glaucoma    both eyes  . History of gastroesophageal reflux (GERD)   . History of radiation therapy    09-04-2014 to 10-13-2014  Right retromolar region (tumor bed) and right neck/  60Gy in 30 fractions to tumor bed and 54Gy in 30 fractions to neck  . History of radiation to head and neck region  09-04-2014 to  10-13-2014   right retromolar region and right neck  60 Gy  . History of tracheostomy [facility])    post op radical neck dissection  07-21-2014--  post op tracheostomy on 07-25-2014  decannulated and sutured 07-29-2014  . Hypertension   . Macular degeneration    right eye only  . MRSA (methicillin resistant staph aureus) culture positive   . Nephrolithiasis    right side--  per CT from [facility] 01-17-2015 non-obstructing  . OA (osteoarthritis)   . Organic impotence   . Pulmonary nodule    benign  and stable per last CT  . Radiation-induced dermatitis   . Squamous cell carcinoma of retromolar trigone [facility]) oncologist-  dr Isidore Moos   Stage IVB, pT1b, pN0, Grade 2, +PNI, no LVSI---  S/P  RADICAL NECK DISSECTION AND RADIATION  . 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 Transitional cell carcinoma of bladder [facility]) urologist-- dr Junious Silk   High - grade  . Venous insufficiency     Patient Active Problem List   Diagnosis Date Noted  . Drug-induced hypokalemia 10/16/2015  . History of bladder cancer 02/08/2015  . Anemia in neoplastic disease 10/17/2014  . 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 Squamous cell carcinoma of retromolar trigone ([place]) 06/07/2014  . Abnormal weight loss 05/31/2014  . Hemoptysis 05/31/2014  . Cancer of the lip, oral cavity, and pharynx ([place]) 05/30/2014  . CLL (chronic lymphocytic leukemia) ([place]) 07/07/2012  . ALLERGIC RHINITIS 02/09/2008  . 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 OSTEOARTHRITIS 06/09/2007  . LOW BACK PAIN 06/09/2007  . Diabetes mellitus without complication ([place]) 0000000  . Essential hypertension 03/03/2007  . GERD 03/03/2007  . BENIGN PROSTATIC HYPERTROPHY 03/03/2007    Past Surgical History:  Procedure Laterality Date  . CATARACT EXTRACTION W/ INTRAOCULAR LENS  IMPLANT, BILATERAL    . CYSTOSCOPY N/A 02/02/2015   Procedure: CYSTOSCOPY, INSTILLATION OF MITOMYCIN C;  Surgeon: Lowella Bandy,  MD;  Location: [facility];  Service: Urology;  Laterality: N/A;  . CYSTOSCOPY WITH BIOPSY N/A 03/27/2015   Procedure: CYSTOSCOPY WITH BLADDER  BIOPSY WITH FULGERATION;  Surgeon: Festus Aloe, MD;  Location[facility];  Service: Urology;  Laterality: N/A;  . INGUINAL HERNIA REPAIR Bilateral right 1988//  left 1970's  . LAMINECTOMY WITH POSTERIOR LATERAL ARTHRODESIS LEVEL 1  09-01-2003   L3 -5 LAMINECTOMY W/ DECOMPRESSION AND FUSION L4-5  . MAXILLECTOMY Right 07/21/14,   [facility]   Radical Neck Dissection, Maxillectomy, Right Marginal Mandibulectomy, dental extractions, Parascauplar fasciocutaeous Free flap reconstruction  . RETINAL DETACHMENT REPAIR W/ SCLERAL BUCKLE LE  11-03-2000  . ROTATOR CUFF REPAIR Right 12-11-2000  . tonsil biopsy Right 05/24/14  . TRANSURETHRAL RESECTION OF BLADDER TUMOR N/A 02/02/2015   Procedure: TRANSURETHRAL RESECTION OF BLADDER TUMOR (TURBT);  Surgeon: Lowella Bandy, MD;  Location:  [facility];  Service: Urology;  Laterality: N/A;       Home Medications    Prior to Admission medications   Medication Sig Start Date End Date Taking? Authorizing Provider  brimonidine-timolol (COMBIGAN) 0.2-0.5 % ophthalmic solution Apply 1 drop to eye 2 (two) times daily.     Historical Provider, MD  DHEA 50 MG TABS Take 1 tablet by mouth daily.    Historical Provider, MD  fluticasone (FLONASE) 50 MCG/ACT nasal spray Place 2 sprays into both nostrils daily. 05/02/15   Historical Provider, MD  furosemide (LASIX) 20 MG tablet  12/01/15   Historical Provider, MD  Javier Docker Oil 300 MG CAPS Take 300 mg by mouth daily.    Historical Provider, MD  latanoprost (XALATAN) 0.005 % ophthalmic solution Place 1 drop into both eyes every evening.  06/01/14   Historical Provider, MD  Lutein-Zeaxanthin 15-0.7 MG CAPS Take 1 tablet by mouth daily.    Historical Provider, MD  MELATONIN PO Take 1 tablet by mouth at bedtime.    Historical Provider, MD  metoprolol  succinate (TOPROL-XL) 100 MG 24 hr tablet TAKE HALF TABLET IMMEDIATELY FOLLOWING A MEAL.  Currently taking 50mg  tab per his PC Patient taking differently: Take 50 mg by mouth every morning. TAKE HALF TABLET IMMEDIATELY FOLLOWING A MEAL.  Currently taking 50mg  tab per his [facility] 11/02/14   Marletta Lor, MD  metoprolol succinate (TOPROL-XL) 100 MG 24 hr tablet TAKE 1 TABLET IMMEDIATELY FOLLOWING A MEAL 02/23/16   Marletta Lor, MD  naproxen sodium (ANAPROX) 220 MG tablet Take 220 mg by mouth 1 day or 1 dose. Reported on 07/27/2015    Historical Provider, MD  omeprazole (PRILOSEC) 40 MG capsule Take 1 capsule (40 mg total) by mouth daily. 10/16/15   Heath Lark, MD  potassium chloride SA (K-DUR,KLOR-CON) 20 MEQ tablet Take 1 tablet (20 mEq total) by mouth daily. 11/05/15   Marletta Lor, MD  predniSONE (DELTASONE) 10 MG tablet TAKE AS DIRECTED BY MOUTH EVERY DAY **6-5-4-3-2-1** 11/22/15   Historical Provider, MD  sildenafil (VIAGRA) 100 MG tablet Take 100 mg by mouth daily as needed for erectile dysfunction. Reported on 01/04/2016    Historical Provider, MD  sodium chloride (OCEAN) 0.65 % nasal spray Place 2 sprays into the nose as needed.  07/30/14   Historical Provider, MD  sodium fluoride (DENTA 5000 PLUS) 1.1 % CREA dental cream Apply a thin ribbon of gel to tooth brush. Brush teeth for 2 minutes. Spit out excess. Repeat nightly. 07/03/15   Lenn Cal, DDS  tadalafil (CIALIS) 20 MG tablet Take 20 mg by mouth daily as needed for erectile dysfunction. Reported on 01/04/2016    Historical Provider, MD  triamcinolone cream (KENALOG) 0.1 % APPLY TO AFFECTED AREA TWICE DAILY 08/13/15   Marletta Lor, MD  zolpidem (AMBIEN) 5 MG tablet TAKE 1 TABLET BY MOUTH AT BEDTIME AS NEEDED FOR SLEEP 02/27/16   Marletta Lor, MD    Family History Family History  Problem Relation Age of Onset  . Cancer Mother     lung ca  . Alcohol abuse Sister   . Hypertension Neg Hx     family hx  . Sudden death  Neg Hx     famiylhx    Social History Social History  Substance Use Topics  . Smoking status: Never Smoker  . Smokeless tobacco: Never Used  . Alcohol use Yes     Comment: Gin and vodka 3-4 daily   (  Hx alcohol withdrawal )     Allergies   Review of patient's allergies indicates no known allergies.   Review of Systems Review of Systems  Constitutional: Negative for appetite change, chills, fatigue and fever.  HENT: Negative for congestion and rhinorrhea.   Respiratory: Negative for cough and shortness of breath.   Cardiovascular: Positive for leg swelling. Negative for chest pain and palpitations.  Gastrointestinal: Negative for abdominal pain, diarrhea, nausea and vomiting.  Genitourinary: Negative for dysuria, flank pain, frequency, hematuria and urgency.  Musculoskeletal: Positive for back pain (Baseline for patient). Negative for gait problem (unable to walk).  Skin: Positive for wound.  Neurological: Positive for syncope and weakness. Negative for dizziness, light-headedness, numbness and headaches.     Physical Exam Updated Vital Signs BP 123/81 (BP Location: Left Arm)   Pulse 67   Temp 98.4 F (36.9 C) (Oral)   Resp 18   Ht 5\' 8"  (1.727 m)   Wt 59.1 kg   SpO2 97%   BMI 19.81 kg/m   Physical Exam  Constitutional: He is oriented to person, place, and time. He appears well-developed and well-nourished. No distress.  HENT:  Head: Normocephalic and atraumatic.  Right Ear: External ear normal.  Left Ear: External ear normal.  Mouth/Throat: Oropharynx is clear and moist and mucous membranes are normal.  Eyes: Conjunctivae and EOM are normal. Pupils are equal, round, and reactive to light. Right eye exhibits no discharge. Left eye exhibits no discharge. No scleral icterus.  Neck: Normal range of motion. Neck supple. No thyromegaly present.  No cervical midline tenderness. No paraspinal tenderness. Full ROM.  Cardiovascular: Normal rate, regular rhythm, normal  heart sounds and intact distal pulses.  Exam reveals no gallop and no friction rub.   No murmur heard. Pulmonary/Chest: Effort normal and breath sounds normal. No respiratory distress. He has no wheezes.  Abdominal: Soft. Bowel sounds are normal. He exhibits no distension. There is no tenderness. There is no rebound.  Musculoskeletal: Normal range of motion.  Abrasion noted to left and right knee. Bleeding is controlled. Full ROM. No deformity or crepitus noted. Abrasion to left and right elbow.  Bleeding is controlled. Full ROM. No deformity noted. Radial pulses 2+ bilaterally. DP pulses 2+ bilaterally. Sensation intact. Denies any pain at this time. No midline tenderness of T-spine and L-spine. Pelvis is stable.  Lymphadenopathy:    He has no cervical adenopathy.  Neurological: He is alert and oriented to person, place, and time. He has normal strength. No cranial nerve deficit or sensory deficit. Coordination normal.  The patient is alert, attentive, and oriented x 3. Speech is clear. Cranial nerve II-VII grossly intact. Negative pronator drift. Sensation intact. Reflexes 2+ and symmetric at biceps, triceps, knees, and ankles. Rapid alternating movement and fine finger movements intact. No pronator drift. General weakness but no focal deficits.    Skin: Skin is warm and dry. Capillary refill takes less than 2 seconds.  Several ecchymosis noted to upper and lower extremities bilaterally.   Nursing note and vitals reviewed.    ED Treatments / Results  Labs (all labs ordered are listed, but only abnormal results are displayed) Labs Reviewed  CBG MONITORING, ED - Abnormal; Notable for the following:       Result Value   Glucose-Capillary 148 (*)    All other components within normal limits  COMPREHENSIVE METABOLIC PANEL  CBC WITH DIFFERENTIAL/PLATELET  URINALYSIS, ROUTINE W REFLEX MICROSCOPIC (NOT AT [facility])  [place], ED  EKG  EKG  Interpretation  Date/Time:  Tuesday March 18 2016 16:16:26 EDT Ventricular Rate:  69 PR Interval:    QRS Duration: 98 QT Interval:  435 QTC Calculation: 466 R Axis:   38 Text Interpretation:  Sinus rhythm Low voltage, extremity leads Confirmed by Jeneen Rinks  MD, [place] ([id number]) on 03/18/2016 4:56:05 PM       Radiology Dg Chest 2 View  Result Date: 03/18/2016 CLINICAL DATA:  Status post fall 2-3 days ago. EXAM: CHEST  2 VIEW COMPARISON:  CT chest 01/04/2015 FINDINGS: There is a stable 17 mm right basilar pulmonary nodule unchanged compared with the prior exam. There is no focal parenchymal opacity. There is no pleural effusion or pneumothorax. The heart and mediastinal contours are unremarkable. Osteolysis of the distal right clavicle unchanged from the prior exam likely secondary to prior resection or secondary to prior trauma. Chronic right rotator cuff tear. Mild osteoarthritis of the right glenohumeral joint. IMPRESSION: No active cardiopulmonary disease. Electronically Signed   By: Kathreen Devoid   On: 03/18/2016 18:07   Dg Elbow Complete Left  Result Date: 03/18/2016 CLINICAL DATA:  Syncope.  Fall.  Left elbow skin tear. EXAM: LEFT ELBOW - COMPLETE 3+ VIEW COMPARISON:  None. FINDINGS: There is no evidence of fracture, dislocation, or joint effusion. There is no evidence of arthropathy or other focal bone abnormality. Soft tissues are unremarkable. IMPRESSION: Negative. Electronically Signed   By: Kerby Moors M.D.   On: 03/18/2016 18:08   Ct Head Wo Contrast  Result Date: 03/18/2016 CLINICAL DATA:  Fall and unable to get up. EXAM: CT HEAD WITHOUT CONTRAST TECHNIQUE: Contiguous axial images were obtained from the base of the skull through the vertex without intravenous contrast. COMPARISON:  09/08/2003 and 01/04/2015 and MRI 05/22/2010 FINDINGS: Brain: There is extensive low density in the white matter particularly in the parietal white matter. These findings appear to be chronic. Mild  cerebral atrophy. No evidence for acute hemorrhage, mass lesion, midline shift, hydrocephalus or large infarct. Vascular: No hyperdense vessel or unexpected calcification. Skull: Partial resection of the right maxillary sinus with a large muscle flap. Multiple surgical clips in this area. Sinuses/Orbits: There is near complete opacification of the right maxillary sinus. There is complete opacification of the left sphenoid sinus. There is some mucosal disease in the ethmoid air cells. Other: None. IMPRESSION: No acute intracranial abnormality. Stable extensive white matter disease probably represents chronic small vessel ischemic disease. Extensive paranasal sinus disease involving the right maxillary sinus and left sphenoid sinus. Chronic postsurgical changes along the right side of the face as described. Electronically Signed   By: Markus Daft M.D.   On: 03/18/2016 20:33   Ct Angio Chest Pe W/cm &/or Wo Cm  Result Date: 03/18/2016 CLINICAL DATA:  [place] and unable to get up due to weakness. EXAM: CT ANGIOGRAPHY CHEST WITH CONTRAST TECHNIQUE: Multidetector CT imaging of the chest was performed using the standard protocol during bolus administration of intravenous contrast. Multiplanar CT image reconstructions and MIPs were obtained to evaluate the vascular anatomy. CONTRAST:  87.3 mL Isovue 370 COMPARISON:  Chest CT 01/04/2015 FINDINGS: Cardiovascular: Pulmonary arteries are well opacified on this examination. Negative for pulmonary embolism. The ascending thoracic aorta is enlarged measuring up to 4.6 cm and stable. No evidence for aortic dissection. Atherosclerotic disease at the origin of left subclavian artery without significant stenosis. Great vessels are patent. Mediastinum/Nodes: No significant mediastinal, hilar or axillary lymphadenopathy. No significant pericardial fluid. Lungs/Pleura: The trachea and mainstem bronchi are patent.  Again noted is focal air trapping along the medial right lower lobe which  is chronic. There is a stable nodule in the right lower lobe that measures roughly 1.9 x 1.3 cm. There are increased densities around this lesion and throughout the right lower lobe which may represent atelectasis. There is dependent atelectasis in the posterior right lower lobe. Focal scarring along the posterior left lung base on sequence 11, image 90. No pneumothorax. Upper Abdomen: Moderate sized hiatal hernia. Hiatal hernia has enlarged since the prior examination. Musculoskeletal: Multilevel degenerative disc disease in the thoracic spine. No acute bone abnormality. Review of the MIP images confirms the above findings. IMPRESSION: Negative for pulmonary embolism. Volume loss and patchy densities in the right lower lobe. Cannot exclude a small focus of infection or aspiration in this area. Fusiform aneurysm of the ascending thoracic aorta measuring up to 4.6 cm. No evidence for an aortic dissection. Ascending thoracic aortic aneurysm. Recommend semi-annual imaging followup by CTA or MRA and referral to cardiothoracic surgery if not already obtained. This recommendation follows 2010 ACCF/AHA/AATS/ACR/ASA/SCA/SCAI/SIR/STS/SVM Guidelines for the Diagnosis and Management of Patients With Thoracic Aortic Disease. Circulation. 2010; 121: [id number] Stable right lower lobe pulmonary nodule. Moderate size hiatal hernia. Electronically Signed   By: Markus Daft M.D.   On: 03/18/2016 20:48   Ct Knee Right Wo Contrast  Result Date: 03/18/2016 CLINICAL DATA:  Status post syncope and fall. Crawled on the floor for 2 days. Persistent clinical concern for right knee fracture after radiograph. Initial encounter. EXAM: CT OF THE RIGHT KNEE WITHOUT CONTRAST TECHNIQUE: Multidetector CT imaging of the right knee was performed according to the standard protocol. Multiplanar CT image reconstructions were also generated. COMPARISON:  Right knee radiographs performed earlier today at 5:28 p.m. FINDINGS: Bones/Joint/Cartilage There is  no evidence of acute fracture or dislocation. A chronic osseous fragment is noted arising at the lateral aspect of the patella, well corticated in appearance. 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 No significant knee joint effusion is seen; trace knee joint fluid remains within normal limits. No significant joint space narrowing is appreciated. The cartilage is not well assessed. Mild chondrocalcinosis is noted. There is minimal marginal osteophyte formation at the medial and lateral compartments, with mild flattening and slight extrusion of the menisci both medially and laterally. Ligaments Suboptimally assessed by CT. The anterior and posterior cruciate ligaments appear grossly intact. The medial collateral ligament and lateral collateral ligament complex are grossly unremarkable in appearance. Muscles and Tendons The visualized musculature is unremarkable in appearance. The quadriceps and patellar tendons remain grossly intact. A small enthesophyte is seen arising from the upper pole of the patella. Soft tissues The vasculature is not well assessed without contrast. Scattered vascular calcifications are seen. Mild soft tissue edema is seen tracking about the right knee. Hoffa's fat pad is unremarkable in appearance. IMPRESSION: 1. No evidence of acute fracture or dislocation. 2. 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 3. Mild chondrocalcinosis noted. Minimal marginal osteophyte formation at the medial and lateral compartments, with mild flattening and slight extrusion of the menisci both medially and laterally. 4. Mild soft tissue edema noted tracking about the right knee. 5. Scattered vascular calcifications seen. Electronically Signed   By: Garald Balding M.D.   On: 03/18/2016 21:08   Dg Knee Complete 4 Views Left  Result Date: 03/18/2016 CLINICAL DATA:   Status post fall 2-3  days ago EXAM: LEFT KNEE - COMPLETE 4+ VIEW COMPARISON:  None. FINDINGS: No evidence of fracture, dislocation, or joint effusion. Bipartite superolateral patella. No evidence of arthropathy or other focal bone abnormality. Soft tissues are unremarkable. Peripheral vascular atherosclerotic disease. IMPRESSION: No acute osseous injury of the left knee. Electronically Signed   By: Kathreen Devoid   On: 03/18/2016 18:02   Dg Knee Complete 4 Views Right  Result Date: 03/18/2016 CLINICAL DATA:  Fall 2-3 days prior. Skin tear in the anterior right knee. EXAM: RIGHT KNEE - COMPLETE 4+ VIEW COMPARISON:  None. FINDINGS: Soft tissue swelling in the anterior/lateral right knee. Oblique lucency through the upper outer right patella demonstrates somewhat well corticated margins. No definite joint effusion. No dislocation. No suspicious focal osseous lesion. No additional linear osseous lucencies. No radiopaque foreign body. IMPRESSION: Oblique lucency through the upper outer right patella with somewhat well corticated margins suggesting either a remote fracture or developmental bipartite patella. An acute right knee fracture is unlikely. Correlate with site of pain. If there is clinical concern for an acute right knee fracture, CT or MRI of the right knee is recommended. Electronically Signed   By: Ilona Sorrel M.D.   On: 03/18/2016 18:09    Procedures Procedures (including critical care time)  Medications Ordered in ED Medications - No data to display   Initial Impression / Assessment and Plan / ED Course  I have reviewed the triage vital signs and the nursing notes.  Pertinent labs & imaging results that were available during my care of the patient were reviewed by me and considered in my medical decision making (see chart for details).  Clinical Course  Patient presented to ED after syncopal episodes 2 days ago. Patient with history of same in July. Lab work showed increased BNP with no  history of heart failure. Mild leukocytosis. And Hgb of 12. Head CT showed not acute findings. Xray of right knee showed possible patellar fracture. CT of knee showed no acute fracture but chronic. D dimer was elevated. No PE noted. However, showed ascending aortic aneurysm and pneumonia. EKG without acute changes and troponin negative. Vital signs are stable. Patient is afebrile and not tachycardic. Patient started on rocephin and azithromycin for CAP. Patient seen and examined by DR. Tegeler who agrees with plan. Will call for admission due to the pna and continued weakness. Talked with hospital medicine who agrees to admit patient. Patient is agreeable to plan.    Final Clinical Impressions(s) / ED Diagnoses   Final diagnoses:  CAP (community acquired pneumonia)  Weakness  Syncope and collapse    New Prescriptions New Prescriptions   No medications on file     GARDELL FARINO, PA-C 03/19/16 0930    Gwenyth Allegra Tegeler, MD 03/19/16 1120

## 2016-03-18 NOTE — ED Triage Notes (Signed)
Per EMS, pt from home.  Pt stated he had weak episode on Sunday. Since then he has had weakness with inability to stand.  No chest pain.  Pt has hx of cancer.  Vitals 130/90, hr 72, 98% ra, 174 cbg

## 2016-03-18 NOTE — Progress Notes (Signed)
error 

## 2016-03-18 NOTE — ED Notes (Signed)
Blood delayed pt in Xray

## 2016-03-18 NOTE — ED Triage Notes (Signed)
Per EMS- patient reported that he fainted 2 days ago and has been crawling around on the floor x 2 days because he could not get up due to weakness. Patient's wife was present and decided to call EMS today.

## 2016-03-18 NOTE — ED Notes (Signed)
Patient transported to CT 

## 2016-03-19 ENCOUNTER — Encounter (HOSPITAL_COMMUNITY): Payer: Self-pay | Admitting: Internal Medicine

## 2016-03-19 ENCOUNTER — Observation Stay (HOSPITAL_BASED_OUTPATIENT_CLINIC_OR_DEPARTMENT_OTHER): Payer: Medicare Other

## 2016-03-19 DIAGNOSIS — D539 Nutritional anemia, unspecified: Secondary | ICD-10-CM | POA: Diagnosis present

## 2016-03-19 DIAGNOSIS — H3530 Unspecified macular degeneration: Secondary | ICD-10-CM | POA: Diagnosis present

## 2016-03-19 DIAGNOSIS — N400 Enlarged prostate without lower urinary tract symptoms: Secondary | ICD-10-CM | POA: Diagnosis present

## 2016-03-19 DIAGNOSIS — R55 Syncope and collapse: Secondary | ICD-10-CM

## 2016-03-19 DIAGNOSIS — I712 Thoracic aortic aneurysm, without rupture: Secondary | ICD-10-CM | POA: Diagnosis present

## 2016-03-19 DIAGNOSIS — R531 Weakness: Secondary | ICD-10-CM

## 2016-03-19 DIAGNOSIS — J189 Pneumonia, unspecified organism: Secondary | ICD-10-CM | POA: Diagnosis present

## 2016-03-19 DIAGNOSIS — Z801 Family history of malignant neoplasm of trachea, bronchus and lung: Secondary | ICD-10-CM | POA: Diagnosis not present

## 2016-03-19 DIAGNOSIS — W19XXXA Unspecified fall, initial encounter: Secondary | ICD-10-CM | POA: Diagnosis present

## 2016-03-19 DIAGNOSIS — Z923 Personal history of irradiation: Secondary | ICD-10-CM | POA: Diagnosis not present

## 2016-03-19 DIAGNOSIS — D630 Anemia in neoplastic disease: Secondary | ICD-10-CM | POA: Diagnosis present

## 2016-03-19 DIAGNOSIS — C062 Malignant neoplasm of retromolar area: Secondary | ICD-10-CM | POA: Diagnosis present

## 2016-03-19 DIAGNOSIS — Z85820 Personal history of malignant melanoma of skin: Secondary | ICD-10-CM | POA: Diagnosis not present

## 2016-03-19 DIAGNOSIS — R911 Solitary pulmonary nodule: Secondary | ICD-10-CM | POA: Diagnosis present

## 2016-03-19 DIAGNOSIS — Z981 Arthrodesis status: Secondary | ICD-10-CM | POA: Diagnosis not present

## 2016-03-19 DIAGNOSIS — I10 Essential (primary) hypertension: Secondary | ICD-10-CM | POA: Diagnosis not present

## 2016-03-19 DIAGNOSIS — Z79899 Other long term (current) drug therapy: Secondary | ICD-10-CM | POA: Diagnosis not present

## 2016-03-19 DIAGNOSIS — E119 Type 2 diabetes mellitus without complications: Secondary | ICD-10-CM | POA: Diagnosis present

## 2016-03-19 DIAGNOSIS — I5189 Other ill-defined heart diseases: Secondary | ICD-10-CM | POA: Diagnosis present

## 2016-03-19 DIAGNOSIS — H4089 Other specified glaucoma: Secondary | ICD-10-CM | POA: Diagnosis present

## 2016-03-19 DIAGNOSIS — R001 Bradycardia, unspecified: Secondary | ICD-10-CM | POA: Diagnosis present

## 2016-03-19 DIAGNOSIS — C148 Malignant neoplasm of overlapping sites of lip, oral cavity and pharynx: Secondary | ICD-10-CM | POA: Diagnosis not present

## 2016-03-19 DIAGNOSIS — K219 Gastro-esophageal reflux disease without esophagitis: Secondary | ICD-10-CM | POA: Diagnosis present

## 2016-03-19 DIAGNOSIS — Z8551 Personal history of malignant neoplasm of bladder: Secondary | ICD-10-CM | POA: Diagnosis not present

## 2016-03-19 LAB — ECHOCARDIOGRAM COMPLETE
CHL CUP MV DEC (S): 289
E/e' ratio: 12.4
EWDT: 289 ms
Height: 68 in
LAVOLA4C: 48.8 mL
LV TDI E'LATERAL: 6.53
LV TDI E'MEDIAL: 4.35
LV e' LATERAL: 6.53 cm/s
LVEEAVG: 12.4
LVEEMED: 12.4
LVOT area: 3.46 cm2
LVOTD: 21 mm
MV Peak grad: 3 mmHg
MV pk E vel: 81 m/s
MVPKAVEL: 113 m/s
RV sys press: 34 mmHg
Reg peak vel: 279 cm/s
TAPSE: 16.9 mm
TRMAXVEL: 279 cm/s
Weight: 2085 oz

## 2016-03-19 LAB — CBC WITH DIFFERENTIAL/PLATELET
BASOS ABS: 0 10*3/uL (ref 0.0–0.1)
BASOS PCT: 0 %
EOS ABS: 0.1 10*3/uL (ref 0.0–0.7)
EOS PCT: 1 %
HCT: 32.9 % — ABNORMAL LOW (ref 39.0–52.0)
Hemoglobin: 11.8 g/dL — ABNORMAL LOW (ref 13.0–17.0)
Lymphocytes Relative: 45 %
Lymphs Abs: 6 10*3/uL — ABNORMAL HIGH (ref 0.7–4.0)
MCH: 36.9 pg — ABNORMAL HIGH (ref 26.0–34.0)
MCHC: 35.9 g/dL (ref 30.0–36.0)
MCV: 102.8 fL — AB (ref 78.0–100.0)
MONO ABS: 0.7 10*3/uL (ref 0.1–1.0)
Monocytes Relative: 5 %
Neutro Abs: 6.7 10*3/uL (ref 1.7–7.7)
Neutrophils Relative %: 50 %
PLATELETS: 198 10*3/uL (ref 150–400)
RBC: 3.2 MIL/uL — ABNORMAL LOW (ref 4.22–5.81)
RDW: 13.5 % (ref 11.5–15.5)
WBC: 13.5 10*3/uL — ABNORMAL HIGH (ref 4.0–10.5)

## 2016-03-19 LAB — COMPREHENSIVE METABOLIC PANEL
ALBUMIN: 2.7 g/dL — AB (ref 3.5–5.0)
ALK PHOS: 76 U/L (ref 38–126)
ALT: 25 U/L (ref 17–63)
AST: 46 U/L — AB (ref 15–41)
Anion gap: 8 (ref 5–15)
BILIRUBIN TOTAL: 1.2 mg/dL (ref 0.3–1.2)
BUN: 11 mg/dL (ref 6–20)
CALCIUM: 8.2 mg/dL — AB (ref 8.9–10.3)
CO2: 30 mmol/L (ref 22–32)
CREATININE: 0.59 mg/dL — AB (ref 0.61–1.24)
Chloride: 95 mmol/L — ABNORMAL LOW (ref 101–111)
GFR calc Af Amer: >60 mL/min (ref >60)
GLUCOSE: 132 mg/dL — AB (ref 65–99)
Potassium: 3.4 mmol/L — ABNORMAL LOW (ref 3.5–5.1)
Sodium: 133 mmol/L — ABNORMAL LOW (ref 135–145)
TOTAL PROTEIN: 5.5 g/dL — AB (ref 6.5–8.1)

## 2016-03-19 LAB — FOLATE: FOLATE: 18 ng/mL (ref >5.9)

## 2016-03-19 LAB — RETICULOCYTES
RBC.: 3.2 MIL/uL — ABNORMAL LOW (ref 4.22–5.81)
Retic Count, Absolute: 96 10*3/uL (ref 19.0–186.0)
Retic Ct Pct: 3 % (ref 0.4–3.1)

## 2016-03-19 LAB — VITAMIN B12: Vitamin B-12: 688 pg/mL (ref 180–914)

## 2016-03-19 LAB — CK: CK TOTAL: 255 U/L (ref 49–397)

## 2016-03-19 LAB — IRON AND TIBC
IRON: 93 ug/dL (ref 45–182)
SATURATION RATIOS: 52 % — AB (ref 17.9–39.5)
TIBC: 178 ug/dL — ABNORMAL LOW (ref 250–450)
UIBC: 85 ug/dL

## 2016-03-19 LAB — FERRITIN: Ferritin: 481 ng/mL — ABNORMAL HIGH (ref 24–336)

## 2016-03-19 LAB — TROPONIN I
TROPONIN I: 0.03 ng/mL — AB (ref <0.03)
Troponin I: 0.03 ng/mL (ref <0.03)
Troponin I: 0.03 ng/mL (ref <0.03)

## 2016-03-19 MED ORDER — ZOLPIDEM TARTRATE 5 MG PO TABS
5.0000 mg | ORAL_TABLET | Freq: Every evening | ORAL | Status: DC | PRN
Start: 2016-03-19 — End: 2016-03-22

## 2016-03-19 MED ORDER — ONDANSETRON HCL 4 MG/2ML IJ SOLN: 4.0000 mg | Freq: Four times a day (QID) | INTRAMUSCULAR | Status: DC | PRN

## 2016-03-19 MED ORDER — ACETAMINOPHEN 650 MG RE SUPP: 650.0000 mg | Freq: Four times a day (QID) | RECTAL | Status: DC | PRN

## 2016-03-19 MED ORDER — FLUTICASONE PROPIONATE 50 MCG/ACT NA SUSP
2.0000 | Freq: Every day | NASAL | Status: DC
  Administered 2016-03-19 – 2016-03-22 (×4): 2 via NASAL
  Filled 2016-03-19: qty 16

## 2016-03-19 MED ORDER — METOPROLOL SUCCINATE ER 50 MG PO TB24
50.0000 mg | ORAL_TABLET | Freq: Every day | ORAL | Status: DC
  Administered 2016-03-19: 50 mg via ORAL
  Filled 2016-03-19 (×2): qty 1

## 2016-03-19 MED ORDER — DEXTROSE 5 % IV SOLN
500.0000 mg | INTRAVENOUS | Status: DC
  Administered 2016-03-19: 500 mg via INTRAVENOUS
  Filled 2016-03-19 (×2): qty 500

## 2016-03-19 MED ORDER — MELATONIN 10 MG PO TABS: 10.0000 mg | ORAL_TABLET | Freq: Every evening | ORAL | Status: DC | PRN

## 2016-03-19 MED ORDER — SODIUM CHLORIDE 0.9 % IV SOLN
INTRAVENOUS | Status: AC
  Administered 2016-03-19 (×2): via INTRAVENOUS

## 2016-03-19 MED ORDER — POTASSIUM CHLORIDE CRYS ER 20 MEQ PO TBCR
40.0000 meq | EXTENDED_RELEASE_TABLET | Freq: Once | ORAL | Status: AC
  Administered 2016-03-19: 40 meq via ORAL
  Filled 2016-03-19: qty 2

## 2016-03-19 MED ORDER — DEXTROSE 5 % IV SOLN
1.0000 g | INTRAVENOUS | Status: DC
  Administered 2016-03-19 – 2016-03-21 (×3): 1 g via INTRAVENOUS
  Filled 2016-03-19 (×4): qty 10

## 2016-03-19 MED ORDER — TRIAMCINOLONE ACETONIDE 0.1 % EX CREA
TOPICAL_CREAM | Freq: Two times a day (BID) | CUTANEOUS | Status: DC
  Administered 2016-03-21 – 2016-03-22 (×2): via TOPICAL
  Filled 2016-03-19: qty 15

## 2016-03-19 MED ORDER — ONDANSETRON HCL 4 MG PO TABS: 4.0000 mg | ORAL_TABLET | Freq: Four times a day (QID) | ORAL | Status: DC | PRN

## 2016-03-19 MED ORDER — DHEA 50 MG PO TABS: 1.0000 | ORAL_TABLET | Freq: Every day | ORAL | Status: DC

## 2016-03-19 MED ORDER — ACETAMINOPHEN 325 MG PO TABS: 650.0000 mg | ORAL_TABLET | Freq: Four times a day (QID) | ORAL | Status: DC | PRN

## 2016-03-19 MED ORDER — SALINE SPRAY 0.65 % NA SOLN
2.0000 | NASAL | Status: DC | PRN
  Filled 2016-03-19: qty 44

## 2016-03-19 MED ORDER — LATANOPROST 0.005 % OP SOLN
1.0000 [drp] | Freq: Every day | OPHTHALMIC | Status: DC
  Administered 2016-03-19 – 2016-03-21 (×4): 1 [drp] via OPHTHALMIC
  Filled 2016-03-19: qty 2.5

## 2016-03-19 MED ORDER — ENOXAPARIN SODIUM 40 MG/0.4ML ~~LOC~~ SOLN
40.0000 mg | SUBCUTANEOUS | Status: DC
  Administered 2016-03-19 – 2016-03-22 (×4): 40 mg via SUBCUTANEOUS
  Filled 2016-03-19 (×4): qty 0.4

## 2016-03-19 MED ORDER — BRIMONIDINE TARTRATE 0.2 % OP SOLN
1.0000 [drp] | Freq: Two times a day (BID) | OPHTHALMIC | Status: DC
  Administered 2016-03-19 – 2016-03-22 (×7): 1 [drp] via OPHTHALMIC
  Filled 2016-03-19: qty 5

## 2016-03-19 MED ORDER — TIMOLOL MALEATE 0.5 % OP SOLN
1.0000 [drp] | Freq: Two times a day (BID) | OPHTHALMIC | Status: DC
  Administered 2016-03-19 – 2016-03-22 (×7): 1 [drp] via OPHTHALMIC
  Filled 2016-03-19: qty 5

## 2016-03-19 MED ORDER — SODIUM FLUORIDE 1.1 % DT CREA
1.0000 | TOPICAL_CREAM | Freq: Every day | DENTAL | Status: DC
Start: 2016-03-19 — End: 2016-03-19

## 2016-03-19 MED ORDER — BRIMONIDINE TARTRATE-TIMOLOL 0.2-0.5 % OP SOLN: 1.0000 [drp] | Freq: Two times a day (BID) | OPHTHALMIC | Status: DC

## 2016-03-19 MED ORDER — KRILL OIL 300 MG PO CAPS: 300.0000 mg | ORAL_CAPSULE | Freq: Every day | ORAL | Status: DC

## 2016-03-19 NOTE — H&P (Signed)
History and Physical    Timothy Kim [id number] DOB: 03-30-1930 DOA: 03/18/2016  PCP: [name], MD  Patient coming from: Home.  Chief Complaint: Loss of consciousness and generalized weakness.  HPI: Timothy Kim is a 82 y.o. male with hypertension, diet-controlled diabetes, lower extremity edema on Lasix, history of CLL and oral cancer under observation was brought to the ER after patient had difficulty ambulating after syncopal episode. Patient states 2 days ago while walking in his deck patient had a syncopal episode and found it difficult to ambulate after the episode. Patient states he may have lost consciousness for a few seconds. But since the episode patient was finding it difficult to walk and his wife helped him to the den later. He was lying on the floor for at least a day and a half and since patient's weakness did not improve patient called EMS and was brought to the ER. Patient denies any incontinence of urine or bowel. CT head did not show anything acute. X-rays show possible right patellar fracture which could be chronic. CT angiogram of the chest was showing ascending aortic aneurysm and pneumonia. EKG shows normal sinus rhythm with point of Troponin negative.   Patient states that last week patient had followed up with patient's ENT surgeon and was started on antibiotics for possible MRSA/drug resistant sinusitis which patient is yet to pick up the medication from the pharmacy.  Patient states he had a similar episode of syncope on July 22 2 months ago. During both episodes patient did not have any palpitations chest pain or shortness of breath prior or after the episode.  ED Course: See history of presenting illness.  Review of Systems: As per HPI, rest all negative.   Past Medical History:  Diagnosis Date  . Alcohol abuse   . Allergic rhinitis   . Anemia in neoplastic disease    chronic  . Bilateral lower extremity edema    feet  .  Borderline diabetes   . BPH (benign prostatic hypertrophy)   . CLL (chronic lymphocytic leukemia) [facility]) oncologist-  dr Heath Lark ([facility])   dx Jan 2014 --- Stage 1--  currently asymptomatic as of Apr 2016 and No active disease  . DDD (degenerative disc disease), cervical   . DDD (degenerative disc disease), lumbosacral   . 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 Dysphasia functional--  speech therapy   post op  radical neck dissection 07-21-2014--  changed diet to no chewy food , softer foods , states with the changes swallowed okay without any issues  . Glaucoma    both eyes  . History of gastroesophageal reflux (GERD)   . History of radiation therapy    09-04-2014 to 10-13-2014  Right retromolar region (tumor bed) and right neck/  60Gy in 30 fractions to tumor bed and 54Gy in 30 fractions to neck  . History of radiation to head and neck region 09-04-2014 to  10-13-2014   right retromolar region and right neck  60 Gy  . History of tracheostomy [facility])    post op radical neck dissection  07-21-2014--  post op tracheostomy on 07-25-2014  decannulated and sutured 07-29-2014  . Hypertension   . Macular degeneration    right eye only  . MRSA (methicillin resistant staph aureus) culture positive   . Nephrolithiasis  right side--  per CT from [facility] 01-17-2015 non-obstructing  . OA (osteoarthritis)   . Organic impotence   . Pulmonary nodule    benign  and stable per last CT  . Radiation-induced dermatitis   . Squamous cell carcinoma of retromolar trigone [facility]) oncologist-  dr Isidore Moos   Stage IVB, pT1b, pN0, Grade 2, +PNI, no LVSI---  S/P  RADICAL NECK DISSECTION AND RADIATION  . 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 Transitional cell carcinoma  of bladder [facility]) urologist-- dr Junious Silk   High - grade  . Venous insufficiency     Past Surgical History:  Procedure Laterality Date  . CATARACT EXTRACTION W/ INTRAOCULAR LENS  IMPLANT, BILATERAL    . CYSTOSCOPY N/A 02/02/2015   Procedure: CYSTOSCOPY, INSTILLATION OF MITOMYCIN C;  Surgeon: Lowella Bandy, MD;  Location: [facility];  Service: Urology;  Laterality: N/A;  . CYSTOSCOPY WITH BIOPSY N/A 03/27/2015   Procedure: CYSTOSCOPY WITH BLADDER  BIOPSY WITH FULGERATION;  Surgeon: Festus Aloe, MD;  Location: [facility];  Service: Urology;  Laterality: N/A;  . INGUINAL HERNIA REPAIR Bilateral right 1988//  left 1970's  . LAMINECTOMY WITH POSTERIOR LATERAL ARTHRODESIS LEVEL 1  09-01-2003   L3 -5 LAMINECTOMY W/ DECOMPRESSION AND FUSION L4-5  . MAXILLECTOMY Right 07/21/14,   [facility]   Radical Neck Dissection, Maxillectomy, Right Marginal Mandibulectomy, dental extractions, Parascauplar fasciocutaeous Free flap reconstruction  . RETINAL DETACHMENT REPAIR W/ SCLERAL BUCKLE LE  11-03-2000  . ROTATOR CUFF REPAIR Right 12-11-2000  . tonsil biopsy Right 05/24/14  . TRANSURETHRAL RESECTION OF BLADDER TUMOR N/A 02/02/2015   Procedure: TRANSURETHRAL RESECTION OF BLADDER TUMOR (TURBT);  Surgeon: Lowella Bandy, MD;  Location: [facility];  Service: Urology;  Laterality: N/A;     reports that he has never smoked. He has never used smokeless tobacco. He reports that he drinks alcohol. He reports that he does not use drugs.  No Known Allergies  Family History  Problem Relation Age of Onset  . Cancer Mother     lung ca  . Alcohol abuse Sister   . Hypertension Neg Hx     family hx  . Sudden death Neg Hx     famiylhx    Prior to Admission medications   Medication Sig Start Date End Date Taking? Authorizing Provider  brimonidine-timolol (COMBIGAN) 0.2-0.5 % ophthalmic solution Place 1 drop into both eyes 2 (two) times daily.    Yes Historical Provider, MD  DHEA  50 MG TABS Take 1 tablet by mouth daily.   Yes Historical Provider, MD  fluticasone (FLONASE) 50 MCG/ACT nasal spray Place 2 sprays into both nostrils daily. 05/02/15  Yes Historical Provider, MD  furosemide (LASIX) 20 MG tablet Take 20 mg by mouth every Monday, Wednesday, and Friday.  12/01/15  Yes Historical Provider, MD  Javier Docker Oil 300 MG CAPS Take 300 mg by mouth daily.   Yes Historical Provider, MD  latanoprost (XALATAN) 0.005 % ophthalmic solution Place 1 drop into both eyes every evening.  06/01/14  Yes Historical Provider, MD  Lutein-Zeaxanthin 15-0.7 MG CAPS Take 1 tablet by mouth daily.   Yes Historical Provider, MD  Melatonin 10 MG TABS Take 10 mg by mouth at bedtime as needed (for sleep).   Yes Historical Provider, MD  metoprolol succinate (TOPROL-XL) 100 MG 24 hr tablet TAKE HALF TABLET IMMEDIATELY FOLLOWING A MEAL.  Currently taking 50mg  tab per his PC Patient  taking differently: Take 50 mg by mouth daily with breakfast.  11/02/14  Yes Marletta Lor, MD  naproxen sodium (ANAPROX) 220 MG tablet Take 220 mg by mouth daily. Reported on 07/27/2015   Yes Historical Provider, MD  potassium chloride SA (K-DUR,KLOR-CON) 20 MEQ tablet Take 1 tablet (20 mEq total) by mouth daily. 11/05/15  Yes Marletta Lor, MD  PRESCRIPTION MEDICATION Take 1 tablet by mouth daily. Chemo medication by mouth 1 time a day   Yes Historical Provider, MD  sodium chloride (OCEAN) 0.65 % nasal spray Place 2 sprays into the nose as needed for congestion.  07/30/14  Yes Historical Provider, MD  sodium fluoride (DENTA 5000 PLUS) 1.1 % CREA dental cream Apply a thin ribbon of gel to tooth brush. Brush teeth for 2 minutes. Spit out excess. Repeat nightly. Patient taking differently: Place 1 application onto teeth at bedtime. Apply a thin ribbon of gel to tooth brush. Brush teeth for 2 minutes. Spit out excess. Repeat nightly. 07/03/15  Yes Lenn Cal, DDS  triamcinolone cream (KENALOG) 0.1 % APPLY TO AFFECTED AREA  TWICE DAILY Patient taking differently: APPLY 1 application TO AFFECTED AREA TWICE DAILY 08/13/15  Yes Marletta Lor, MD  zolpidem (AMBIEN) 5 MG tablet TAKE 1 TABLET BY MOUTH AT BEDTIME AS NEEDED FOR SLEEP 02/27/16  Yes Marletta Lor, MD  metoprolol succinate (TOPROL-XL) 100 MG 24 hr tablet TAKE 1 TABLET IMMEDIATELY FOLLOWING A MEAL Patient not taking: Reported on 03/18/2016 02/23/16   Marletta Lor, MD  omeprazole (PRILOSEC) 40 MG capsule Take 1 capsule (40 mg total) by mouth daily. Patient not taking: Reported on 03/18/2016 10/16/15   Heath Lark, MD    Physical Exam: Vitals:   03/18/16 1930 03/18/16 2030 03/18/16 2130 03/18/16 2250  BP: 124/73 112/82 116/85 129/69  Pulse: 67  71 64  Resp: 12  19 16   Temp:    98.6 F (37 C)  TempSrc:    Oral  SpO2: 98%  97% 98%  Weight:      Height:          Constitutional: Not in distress. Well-built. Vitals:   03/18/16 1930 03/18/16 2030 03/18/16 2130 03/18/16 2250  BP: 124/73 112/82 116/85 129/69  Pulse: 67  71 64  Resp: 12  19 16   Temp:    98.6 F (37 C)  TempSrc:    Oral  SpO2: 98%  97% 98%  Weight:      Height:       Eyes: Anicteric no pallor. ENMT: No discharge from the ears eyes nose or mouth. Neck: No neck rigidity no mass felt. Respiratory: No rhonchi or crepitations. Cardiovascular: S1-S2 heard. Possible diastolic murmur. Abdomen: Soft nontender bowel sounds present. No guarding or rigidity. Musculoskeletal: No obvious joint effusions and patient has weakness generally. Skin: No rash. Has chronic skin changes. Neurologic: Alert awake oriented to time place and person. Generally weak but not definitely focal. Poor reflexes. Psychiatric: Appears normal.   Labs on Admission: I have personally reviewed following labs and imaging studies  CBC:  Recent Labs Lab 03/18/16 1807  WBC 12.4*  NEUTROABS 6.2  HGB 12.0*  HCT 33.9*  MCV 105.9*  PLT [id number]   Basic Metabolic Panel:  Recent Labs Lab 03/18/16 1807    NA 134*  K 3.6  CL 96*  CO2 29  GLUCOSE 113*  BUN 11  CREATININE 0.68  CALCIUM 8.2*   GFR: Estimated Creatinine Clearance: 58.5 mL/min (by C-G formula based on SCr  of 0.68 mg/dL). Liver Function Tests:  Recent Labs Lab 03/18/16 1807  AST 50*  ALT 26  ALKPHOS 76  BILITOT 1.6*  PROT 5.5*  ALBUMIN 2.7*   No results for input(s): LIPASE, AMYLASE in the last 168 hours. No results for input(s): AMMONIA in the last 168 hours. Coagulation Profile: No results for input(s): INR, PROTIME in the last 168 hours. Cardiac Enzymes: No results for input(s): CKTOTAL, CKMB, CKMBINDEX, TROPONINI in the last 168 hours. BNP (last 3 results) No results for input(s): PROBNP in the last 8760 hours. HbA1C: No results for input(s): HGBA1C in the last 72 hours. CBG:  Recent Labs Lab 03/18/16 1620  GLUCAP 148*   Lipid Profile: No results for input(s): CHOL, HDL, LDLCALC, TRIG, CHOLHDL, LDLDIRECT in the last 72 hours. Thyroid Function Tests: No results for input(s): TSH, T4TOTAL, FREET4, T3FREE, THYROIDAB in the last 72 hours. Anemia Panel: No results for input(s): VITAMINB12, FOLATE, FERRITIN, TIBC, IRON, RETICCTPCT in the last 72 hours. Urine analysis:    Component Value Date/Time   LABSPEC 1.010 06/07/2014 1535   PHURINE 8.0 06/07/2014 1535   GLUCOSEU Negative 06/07/2014 1535   HGBUR Negative 06/07/2014 1535   BILIRUBINUR Negative 06/07/2014 1535   KETONESUR 40 06/07/2014 1535   PROTEINUR 30 06/07/2014 1535   UROBILINOGEN 0.2 06/07/2014 1535   NITRITE Negative 06/07/2014 1535   LEUKOCYTESUR Trace 06/07/2014 1535   Sepsis Labs: @LABRCNTIP (procalcitonin:4,lacticidven:4) )No results found for this or any previous visit (from the past 240 hour(s)).   Radiological Exams on Admission: Dg Chest 2 View  Result Date: 03/18/2016 CLINICAL DATA:  Status post fall 2-3 days ago. EXAM: CHEST  2 VIEW COMPARISON:  CT chest 01/04/2015 FINDINGS: There is a stable 17 mm right basilar pulmonary  nodule unchanged compared with the prior exam. There is no focal parenchymal opacity. There is no pleural effusion or pneumothorax. The heart and mediastinal contours are unremarkable. Osteolysis of the distal right clavicle unchanged from the prior exam likely secondary to prior resection or secondary to prior trauma. Chronic right rotator cuff tear. Mild osteoarthritis of the right glenohumeral joint. IMPRESSION: No active cardiopulmonary disease. Electronically Signed   By: Kathreen Devoid   On: 03/18/2016 18:07   Dg Elbow Complete Left  Result Date: 03/18/2016 CLINICAL DATA:  Syncope.  Fall.  Left elbow skin tear. EXAM: LEFT ELBOW - COMPLETE 3+ VIEW COMPARISON:  None. FINDINGS: There is no evidence of fracture, dislocation, or joint effusion. There is no evidence of arthropathy or other focal bone abnormality. Soft tissues are unremarkable. IMPRESSION: Negative. Electronically Signed   By: Kerby Moors M.D.   On: 03/18/2016 18:08   Ct Head Wo Contrast  Result Date: 03/18/2016 CLINICAL DATA:  Fall and unable to get up. EXAM: CT HEAD WITHOUT CONTRAST TECHNIQUE: Contiguous axial images were obtained from the base of the skull through the vertex without intravenous contrast. COMPARISON:  09/08/2003 and 01/04/2015 and MRI 05/22/2010 FINDINGS: Brain: There is extensive low density in the white matter particularly in the parietal white matter. These findings appear to be chronic. Mild cerebral atrophy. No evidence for acute hemorrhage, mass lesion, midline shift, hydrocephalus or large infarct. Vascular: No hyperdense vessel or unexpected calcification. Skull: Partial resection of the right maxillary sinus with a large muscle flap. Multiple surgical clips in this area. Sinuses/Orbits: There is near complete opacification of the right maxillary sinus. There is complete opacification of the left sphenoid sinus. There is some mucosal disease in the ethmoid air cells. Other: None. IMPRESSION: No  acute intracranial  abnormality. Stable extensive white matter disease probably represents chronic small vessel ischemic disease. Extensive paranasal sinus disease involving the right maxillary sinus and left sphenoid sinus. Chronic postsurgical changes along the right side of the face as described. Electronically Signed   By: Markus Daft M.D.   On: 03/18/2016 20:33   Ct Angio Chest Pe W/cm &/or Wo Cm  Result Date: 03/18/2016 CLINICAL DATA:  [place] and unable to get up due to weakness. EXAM: CT ANGIOGRAPHY CHEST WITH CONTRAST TECHNIQUE: Multidetector CT imaging of the chest was performed using the standard protocol during bolus administration of intravenous contrast. Multiplanar CT image reconstructions and MIPs were obtained to evaluate the vascular anatomy. CONTRAST:  87.3 mL Isovue 370 COMPARISON:  Chest CT 01/04/2015 FINDINGS: Cardiovascular: Pulmonary arteries are well opacified on this examination. Negative for pulmonary embolism. The ascending thoracic aorta is enlarged measuring up to 4.6 cm and stable. No evidence for aortic dissection. Atherosclerotic disease at the origin of left subclavian artery without significant stenosis. Great vessels are patent. Mediastinum/Nodes: No significant mediastinal, hilar or axillary lymphadenopathy. No significant pericardial fluid. Lungs/Pleura: The trachea and mainstem bronchi are patent. Again noted is focal air trapping along the medial right lower lobe which is chronic. There is a stable nodule in the right lower lobe that measures roughly 1.9 x 1.3 cm. There are increased densities around this lesion and throughout the right lower lobe which may represent atelectasis. There is dependent atelectasis in the posterior right lower lobe. Focal scarring along the posterior left lung base on sequence 11, image 90. No pneumothorax. Upper Abdomen: Moderate sized hiatal hernia. Hiatal hernia has enlarged since the prior examination. Musculoskeletal: Multilevel degenerative disc disease in the  thoracic spine. No acute bone abnormality. Review of the MIP images confirms the above findings. IMPRESSION: Negative for pulmonary embolism. Volume loss and patchy densities in the right lower lobe. Cannot exclude a small focus of infection or aspiration in this area. Fusiform aneurysm of the ascending thoracic aorta measuring up to 4.6 cm. No evidence for an aortic dissection. Ascending thoracic aortic aneurysm. Recommend semi-annual imaging followup by CTA or MRA and referral to cardiothoracic surgery if not already obtained. This recommendation follows 2010 ACCF/AHA/AATS/ACR/ASA/SCA/SCAI/SIR/STS/SVM Guidelines for the Diagnosis and Management of Patients With Thoracic Aortic Disease. Circulation. 2010; 121: [id number] Stable right lower lobe pulmonary nodule. Moderate size hiatal hernia. Electronically Signed   By: Markus Daft M.D.   On: 03/18/2016 20:48   Ct Knee Right Wo Contrast  Result Date: 03/18/2016 CLINICAL DATA:  Status post syncope and fall. Crawled on the floor for 2 days. Persistent clinical concern for right knee fracture after radiograph. Initial encounter. EXAM: CT OF THE RIGHT KNEE WITHOUT CONTRAST TECHNIQUE: Multidetector CT imaging of the right knee was performed according to the standard protocol. Multiplanar CT image reconstructions were also generated. COMPARISON:  Right knee radiographs performed earlier today at 5:28 p.m. FINDINGS: Bones/Joint/Cartilage There is no evidence of acute fracture or dislocation. A chronic osseous fragment is noted arising at the lateral aspect of the patella, well corticated in appearance. 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 No significant knee joint effusion is seen; trace knee joint fluid remains within normal limits. No significant joint space narrowing is appreciated. The cartilage is not well assessed. Mild chondrocalcinosis is noted. There is minimal marginal osteophyte formation at the medial and lateral compartments,  with mild flattening and slight extrusion of the menisci both medially and laterally. Ligaments Suboptimally assessed by CT.  The anterior and posterior cruciate ligaments appear grossly intact. The medial collateral ligament and lateral collateral ligament complex are grossly unremarkable in appearance. Muscles and Tendons The visualized musculature is unremarkable in appearance. The quadriceps and patellar tendons remain grossly intact. A small enthesophyte is seen arising from the upper pole of the patella. Soft tissues The vasculature is not well assessed without contrast. Scattered vascular calcifications are seen. Mild soft tissue edema is seen tracking about the right knee. Hoffa's fat pad is unremarkable in appearance. IMPRESSION: 1. No evidence of acute fracture or dislocation. 2. 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 3. Mild chondrocalcinosis noted. Minimal marginal osteophyte formation at the medial and lateral compartments, with mild flattening and slight extrusion of the menisci both medially and laterally. 4. Mild soft tissue edema noted tracking about the right knee. 5. Scattered vascular calcifications seen. Electronically Signed   By: Garald Balding M.D.   On: 03/18/2016 21:08   Dg Knee Complete 4 Views Left  Result Date: 03/18/2016 CLINICAL DATA:  Status post fall 2-3 days ago EXAM: LEFT KNEE - COMPLETE 4+ VIEW COMPARISON:  None. FINDINGS: No evidence of fracture, dislocation, or joint effusion. Bipartite superolateral patella. No evidence of arthropathy or other focal bone abnormality. Soft tissues are unremarkable. Peripheral vascular atherosclerotic disease. IMPRESSION: No acute osseous injury of the left knee. Electronically Signed   By: Kathreen Devoid   On: 03/18/2016 18:02   Dg Knee Complete 4 Views Right  Result Date: 03/18/2016 CLINICAL DATA:  Fall 2-3 days prior.  Skin tear in the anterior right knee. EXAM: RIGHT KNEE - COMPLETE 4+ VIEW COMPARISON:  None. FINDINGS: Soft tissue swelling in the anterior/lateral right knee. Oblique lucency through the upper outer right patella demonstrates somewhat well corticated margins. No definite joint effusion. No dislocation. No suspicious focal osseous lesion. No additional linear osseous lucencies. No radiopaque foreign body. IMPRESSION: Oblique lucency through the upper outer right patella with somewhat well corticated margins suggesting either a remote fracture or developmental bipartite patella. An acute right knee fracture is unlikely. Correlate with site of pain. If there is clinical concern for an acute right knee fracture, CT or MRI of the right knee is recommended. Electronically Signed   By: Ilona Sorrel M.D.   On: 03/18/2016 18:09    EKG: Independently reviewed. Normal sinus rhythm with acceptable QTc interval.  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 Syncope - cause not clear. Will closely monitor in telemetry and get 2-D echo. Cycle cardiac markers. Get physical therapy consult and check orthostatics. Hold Lasix for now. Gently hydrate. 2. Possible pneumonia could be from aspiration - patient has been placed on ceftriaxone and Zithromax. 3. Generalized weakness - appears nonfocal. Get physical therapy consult. Check CK levels and TSH. 4. Possible patellar fracture of the right knee which could be chronic. Closely observe. 5. Hypertension on metoprolol. 6. Ascending aortic aneurysm will need close follow-up as outpatient with vascular surgeon. Denies any chest pain at this time. 7. Macrocytic anemia - check anemia panel and follow  CBC. 8. History of CLL oral cancer and bladder cancer being followed  by oncologist.   DVT prophylaxis: Lovenox. Code Status: Full code.  Family Communication: Discussed with patient.  Disposition Plan: Home.  Consults called: Physical therapy.  Admission status: Observation.    Rise Patience MD [facility] Pager [phone].  If 7PM-7AM, please contact night-coverage [email or web address] Password [employer]  03/19/2016, 12:22 AM

## 2016-03-19 NOTE — Progress Notes (Signed)
Timothy Kim is a 82 y.o. male with hypertension, diet-controlled diabetes, lower extremity edema on Lasix, history of CLL and oral cancer under observation was brought to the ER after patient had difficulty ambulating after syncopal episode.  Pt reports this to be a second one after the one in July. He was admitted earlier this am. Please see Dr Moise Boring note in detail.  Plan: 1. Evaluate for cardiac arrythmias, and orthostatic hypotension.  2. Get echo.  3. Monitor on telemetry.  4. Treat infection.   Hosie Poisson, MD [phone]

## 2016-03-19 NOTE — Progress Notes (Addendum)
PHARMACIST - PHYSICIAN ORDER COMMUNICATION  CONCERNING: P&T Medication Policy on Herbal Medications  DESCRIPTION:  This patient's order for:  Melatonin, DHEA, Krill Oil  has been noted.  This product(s) is classified as an "herbal" or natural product. Due to a lack of definitive safety studies or FDA approval, nonstandard manufacturing practices, plus the potential risk of unknown drug-drug interactions while on inpatient medications, the Pharmacy and Therapeutics Committee does not permit the use of "herbal" or natural products of this type within [facility].   ACTION TAKEN: The pharmacy department is unable to verify this order at this time and your patient has been informed of this safety policy. Please reevaluate patient's clinical condition at discharge and address if the herbal or natural product(s) should be resumed at that time.  Leone Haven, PharmD

## 2016-03-19 NOTE — Progress Notes (Signed)
Initial Nutrition Assessment  DOCUMENTATION CODES:   Severe malnutrition in context of chronic illness  INTERVENTION:  Continue Ensure Enlive po BID, each supplement provides 350 kcal and 20 grams of protein.  Encouraged adequate intake of protein and calories with meals and snacks. Discussed soft options patient can tolerate.   NUTRITION DIAGNOSIS:   Malnutrition (Severe) related to cancer and cancer related treatments, chronic illness as evidenced by 24 percent weight loss over 20 months, severe depletion of body fat, severe depletion of muscle mass.  GOAL:   Patient will meet greater than or equal to 90% of their needs  MONITOR:   PO intake, Supplement acceptance, Labs, Weight trends, I & O's  REASON FOR ASSESSMENT:   Malnutrition Screening Tool    ASSESSMENT:   82 y.o. male with hypertension, diet-controlled diabetes, lower extremity edema on Lasix, history of CLL and oral cancer under observation was brought to the ER after patient had difficulty ambulating after syncopal episode.  Patient reports his appetite and intake has been poor since maxillectomy on 07/21/2014. He reports it is difficult to eat because they took out 4 teeth and his jaws do not align. He reports he has been eating only 20% of usual intake prior to that. Usual intake is yogurt for breakfast, skips lunch, and then a small meal for dinner. He was drinking a protein milkshake, but hasn't been lately. UBW was 171 lbs prior to January 2016. He has lost 41 lbs (24% body weight) over 20 months.   Meal Completion: 100% per chart  Medications reviewed and include: NS @ 75 ml/hr. Patient already ordered for Ensure Enlive BID.  Labs reviewed: Sodium 133, Potassium 3.4, Chloride 95, Ferritin 481, Folate WNL, Vitamin B12 WNL.  Nutrition-Focused physical exam completed. Findings are severe fat depletion, severe muscle depletion, and mild edema.   Diet Order:  Diet Carb Modified Fluid consistency: Thin; Room  service appropriate? Yes  Skin:  Reviewed, no issues  Last BM:  03/17/2016  Height:   Ht Readings from Last 1 Encounters:  03/18/16 5\' 8"  (1.727 m)    Weight:   Wt Readings from Last 1 Encounters:  03/18/16 130 lb 5 oz (59.1 kg)    Ideal Body Weight:  70 kg  BMI:  Body mass index is 19.81 kg/m.  Estimated Nutritional Needs:   Kcal:  1800-2100  Protein:  70-90 grams  Fluid:  1.8-2.1 L/day  EDUCATION NEEDS:   Education needs addressed (Small, frequent meals. Increased protein and calorie needs. Soft options.)  Timothy Blade, MS, RD, LDN Pager: [phone] After Hours Pager: [phone]

## 2016-03-19 NOTE — Progress Notes (Signed)
CRITICAL VALUE ALERT  Critical value received:  Troponin 0.03  Date of notification:  03/19/2016  Time of notification:  0250  Critical value read back:Yes.    Nurse who received alert:  Haywood Lasso, RN  MD notified (1st page):  K.Schorr  Time of first page:  0252  MD notified (2nd page):  Time of second page:  Responding MD:    Time MD responded:  Will carry out any new orders.

## 2016-03-19 NOTE — Evaluation (Signed)
Physical Therapy Evaluation Patient Details Name: GEOVANNI ORLANDI MRN: [id number] DOB: Dec 26, 1929 Today's Date: 03/19/2016   History of Present Illness  KOEN KRESSIN is a 82 y.o. male with hypertension, diet-controlled diabetes, lower extremity edema, history of CLL and oral cancer was brought to the ER 03/18/16  after patient had difficulty ambulating after syncopal episode.  Pt reports this to be a second one after the one in July  Clinical Impression  The patient reports feeling fatigued. He did mobilize to bed edge and stand. BP 127/91.  Pt admitted with above diagnosis. Pt currently with functional limitations due to the deficits listed below (see PT Problem List).  Pt will benefit from skilled PT to increase their independence and safety with mobility to allow discharge to the venue listed below.       Follow Up Recommendations Home health PT    Equipment Recommendations  None recommended by PT    Recommendations for Other Services       Precautions / Restrictions Precautions Precautions: Fall      Mobility  Bed Mobility Overal bed mobility: Needs Assistance Bed Mobility: Supine to Sit;Sit to Supine     Supine to sit: Mod assist Sit to supine: Mod assist   General bed mobility comments: assist with trunk and  legs onto bed  Transfers Overall transfer level: Needs assistance Equipment used: Rolling walker (2 wheeled) Transfers: Sit to/from Stand Sit to Stand: Min assist         General transfer comment: cues for hand placement  Ambulation/Gait Ambulation/Gait assistance: Min assist Ambulation Distance (Feet): 4 Feet Assistive device: Rolling walker (2 wheeled)       General Gait Details: side steps along the bed  Stairs            Wheelchair Mobility    Modified Rankin (Stroke Patients Only)       Balance Overall balance assessment: Needs assistance;History of Falls Sitting-balance support: Feet supported;Bilateral upper extremity  supported Sitting balance-Leahy Scale: Fair     Standing balance support: During functional activity;Bilateral upper extremity supported Standing balance-Leahy Scale: Fair                               Pertinent Vitals/Pain Pain Assessment: 0-10 Pain Score: 5  Pain Location: back Pain Descriptors / Indicators: Discomfort;Grimacing;Guarding Pain Intervention(s): Limited activity within patient's tolerance;Monitored during session;Repositioned    Home Living Family/patient expects to be discharged to:: Private residence Living Arrangements: Spouse/significant other Available Help at Discharge: Family Type of Home: House Home Access: Stairs to enter   Technical brewer of Steps: 1 Home Layout: One level Home Equipment: Environmental consultant - 2 wheels      Prior Function Level of Independence: Independent               Hand Dominance        Extremity/Trunk Assessment   Upper Extremity Assessment: Generalized weakness           Lower Extremity Assessment: Generalized weakness      Cervical / Trunk Assessment: Kyphotic  Communication      Cognition Arousal/Alertness: Awake/alert Behavior During Therapy: WFL for tasks assessed/performed Overall Cognitive Status: Within Functional Limits for tasks assessed                      General Comments      Exercises     Assessment/Plan    PT Assessment Patient needs continued  PT services  PT Problem List Decreased strength;Decreased activity tolerance;Decreased balance;Decreased mobility;Decreased knowledge of use of DME;Decreased safety awareness;Decreased knowledge of precautions          PT Treatment Interventions DME instruction;Gait training;Stair training;Functional mobility training;Therapeutic activities;Therapeutic exercise;Patient/family education    PT Goals (Current goals can be found in the Care Plan section)  Acute Rehab PT Goals Patient Stated Goal: to not fall PT Goal  Formulation: With patient Time For Goal Achievement: 04/02/16 Potential to Achieve Goals: Good    Frequency Min 3X/week   Barriers to discharge Decreased caregiver support      Co-evaluation               End of Session   Activity Tolerance: Patient limited by fatigue Patient left: in bed;with call bell/phone within reach;with bed alarm set Nurse Communication: Mobility status    Functional Assessment Tool Used: clinical judgement Functional Limitation: Mobility: Walking and moving around Mobility: Walking and Moving Around Current Status [id number]): At least 20 percent but less than 40 percent impaired, limited or restricted Mobility: Walking and Moving Around Goal Status [phone]): 0 percent impaired, limited or restricted    Time: [id number] PT Time Calculation (min) (ACUTE ONLY): 20 min   Charges:   PT Evaluation $PT Eval Low Complexity: 1 Procedure     PT G Codes:   PT G-Codes **NOT FOR INPATIENT CLASS** Functional Assessment Tool Used: clinical judgement Functional Limitation: Mobility: Walking and moving around Mobility: Walking and Moving Around Current Status [id number]): At least 20 percent but less than 40 percent impaired, limited or restricted Mobility: Walking and Moving Around Goal Status [phone]): 0 percent impaired, limited or restricted    Claretha Cooper 03/19/2016, 4:38 PM  Tresa Endo PT [phone]

## 2016-03-19 NOTE — Progress Notes (Signed)
Pharmacy Antibiotic Note  Timothy Kim is a 82 y.o. male admitted on 03/18/2016 with CAP.  Pharmacy has been consulted for Ceftriaxone dosing.  Plan:  Ceftriaxone 1g IV q24h  Dosage remains stable and need for further dosage adjustment appears unlikely at present.  Pharmacy will sign off at this time.  Please reconsult if a change in clinical status warrants re-evaluation of dosage.   Height: 5\' 8"  (172.7 cm) Weight: 130 lb 5 oz (59.1 kg) IBW/kg (Calculated) : 68.4  Temp (24hrs), Avg:98.4 F (36.9 C), Min:98.3 F (36.8 C), Max:98.6 F (37 C)   Recent Labs Lab 03/18/16 1807 03/19/16 0151  WBC 12.4* 13.5*  CREATININE 0.68 0.59*    Estimated Creatinine Clearance: 58.5 mL/min (by C-G formula based on SCr of 0.59 mg/dL (L)).    No Known Allergies  Microbiology results: None  Thank you for allowing pharmacy to be a part of this patient's care.  Gretta Arab PharmD, BCPS Pager [phone] 03/19/2016 7:38 AM

## 2016-03-19 NOTE — Progress Notes (Signed)
*  PRELIMINARY RESULTS* Echocardiogram 2D Echocardiogram has been performed.  Leavy Cella 03/19/2016, 10:54 AM

## 2016-03-20 ENCOUNTER — Telehealth: Payer: Self-pay

## 2016-03-20 ENCOUNTER — Telehealth: Payer: Self-pay | Admitting: Adult Health

## 2016-03-20 DIAGNOSIS — C148 Malignant neoplasm of overlapping sites of lip, oral cavity and pharynx: Secondary | ICD-10-CM

## 2016-03-20 LAB — CBC
HEMATOCRIT: 32.3 % — AB (ref 39.0–52.0)
HEMOGLOBIN: 11.3 g/dL — AB (ref 13.0–17.0)
MCH: 37.7 pg — ABNORMAL HIGH (ref 26.0–34.0)
MCHC: 35 g/dL (ref 30.0–36.0)
MCV: 107.7 fL — ABNORMAL HIGH (ref 78.0–100.0)
Platelets: 177 10*3/uL (ref 150–400)
RBC: 3 MIL/uL — AB (ref 4.22–5.81)
RDW: 13.7 % (ref 11.5–15.5)
WBC: 10.6 10*3/uL — AB (ref 4.0–10.5)

## 2016-03-20 LAB — URINE MICROSCOPIC-ADD ON

## 2016-03-20 LAB — URINALYSIS, ROUTINE W REFLEX MICROSCOPIC
Bilirubin Urine: NEGATIVE
GLUCOSE, UA: NEGATIVE mg/dL
Hgb urine dipstick: NEGATIVE
KETONES UR: NEGATIVE mg/dL
Nitrite: NEGATIVE
PROTEIN: NEGATIVE mg/dL
Specific Gravity, Urine: 1.023 (ref 1.005–1.030)
pH: 8 (ref 5.0–8.0)

## 2016-03-20 LAB — BASIC METABOLIC PANEL
ANION GAP: 5 (ref 5–15)
BUN: 10 mg/dL (ref 6–20)
CALCIUM: 8.1 mg/dL — AB (ref 8.9–10.3)
CO2: 31 mmol/L (ref 22–32)
Chloride: 99 mmol/L — ABNORMAL LOW (ref 101–111)
Creatinine, Ser: 0.58 mg/dL — ABNORMAL LOW (ref 0.61–1.24)
Glucose, Bld: 107 mg/dL — ABNORMAL HIGH (ref 65–99)
POTASSIUM: 3.7 mmol/L (ref 3.5–5.1)
SODIUM: 135 mmol/L (ref 135–145)

## 2016-03-20 MED ORDER — AZITHROMYCIN 250 MG PO TABS
500.0000 mg | ORAL_TABLET | Freq: Every day | ORAL | Status: DC
  Administered 2016-03-20 – 2016-03-21 (×2): 500 mg via ORAL
  Filled 2016-03-20 (×2): qty 2

## 2016-03-20 MED ORDER — VITAMINS A & D EX OINT
TOPICAL_OINTMENT | CUTANEOUS | Status: AC
  Filled 2016-03-20: qty 5

## 2016-03-20 MED ORDER — METOPROLOL TARTRATE 25 MG PO TABS
12.5000 mg | ORAL_TABLET | Freq: Every day | ORAL | Status: DC
Start: 2016-03-21 — End: 2016-03-22
  Administered 2016-03-21 – 2016-03-22 (×2): 12.5 mg via ORAL
  Filled 2016-03-20 (×2): qty 1

## 2016-03-20 NOTE — Progress Notes (Signed)
PROGRESS NOTE    Timothy Kim  [id number] DOB: 23-Jun-1930 DOA: 03/18/2016 PCP: [name], MD    Brief Narrative: Timothy Leis Coffeyis a 82 y.o.malewith hypertension, diet-controlled diabetes, lower extremity edema on Lasix, history of CLL and oral cancer under observation was brought to the ER afterpatient had difficulty ambulating after syncopal episode.   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  Tele monitor revealed bradycardia and pauses. Metoprolol held today and plan to restart at  12.5 mg daily from tomorrow.  Echocardiogram revealed LVEF of 65 %, ad grade 1 diastolic dysfunction .  Doppler parameters are consistent with elevated  ventricular end-diastolic filling pressure.   Atypical pneumonia: On IV antibiotics.  Recommend checking repeat CXR in 4 weeks to evaluate for resolution.   Squamous cell ca of the retromolar trigone: currently undergoing radiation.   Anemia: macrocytic.  Stable.          DVT prophylaxis: (Lovenox) Code Status: (Full) Family Communication: none at bedside.  Disposition Plan: possibly Saturday.    Consultants:  none  Procedures: none   Antimicrobials: rocephin and zithromax.    Subjective: Reports not feeling well.   Objective: Vitals:   03/19/16 2100 03/20/16 0508 03/20/16 0852 03/20/16 1315  BP: 122/74 125/73 123/68 132/82  Pulse: 74 60  68  Resp: 20 18  20   Temp: 98.5 F (36.9 C) 97.4 F (36.3 C)  97.8 F (36.6 C)  TempSrc: Oral Oral  Oral  SpO2: 96% 98%  100%  Weight:      Height:        Intake/Output Summary (Last 24 hours) at 03/20/16 1758 Last data filed at 03/20/16 1120  Gross  per 24 hour  Intake              420 ml  Output              326 ml  Net               94 ml   Filed Weights   03/18/16 1613  Weight: 59.1 kg (130 lb 5 oz)    Examination:  General exam: Appears calm and comfortable  Respiratory system: Clear to auscultation. Respiratory effort normal. Cardiovascular system: S1 & S2 heard, RRR. No JVD, murmurs,  No pedal edema. Gastrointestinal system: Abdomen is nondistended, soft and nontender. No organomegaly or masses felt. Normal bowel sounds heard. Central nervous system: Alert and oriented. No focal neurological deficits. Extremities: Symmetric 5 x 5 power. Skin: bruising over the right elbow , arm and knee     Data Reviewed: I have personally reviewed following labs and imaging studies  CBC:  Recent Labs Lab 03/18/16 1807 03/19/16 0151 03/20/16 0935  WBC 12.4* 13.5* 10.6*  NEUTROABS 6.2 6.7  --   HGB 12.0* 11.8* 11.3*  HCT 33.9* 32.9* 32.3*  MCV 105.9* 102.8* 107.7*  PLT 216 198 [id number]   Basic Metabolic Panel:  Recent Labs Lab 03/18/16 1807 03/19/16 0151 03/20/16 0935  NA 134* 133* 135  K 3.6 3.4* 3.7  CL 96* 95* 99*  CO2 29 30 31   GLUCOSE 113* 132* 107*  BUN 11 11 10   CREATININE 0.68 0.59*  0.58*  CALCIUM 8.2* 8.2* 8.1*   GFR: Estimated Creatinine Clearance: 58.5 mL/min (by C-G formula based on SCr of 0.58 mg/dL (L)). Liver Function Tests:  Recent Labs Lab 03/18/16 1807 03/19/16 0151  AST 50* 46*  ALT 26 25  ALKPHOS 76 76  BILITOT 1.6* 1.2  PROT 5.5* 5.5*  ALBUMIN 2.7* 2.7*   No results for input(s): LIPASE, AMYLASE in the last 168 hours. No results for input(s): AMMONIA in the last 168 hours. Coagulation Profile: No results for input(s): INR, PROTIME in the last 168 hours. Cardiac Enzymes:  Recent Labs Lab 03/19/16 0151 03/19/16 0707 03/19/16 1310  CKTOTAL 255  --   --   TROPONINI 0.03* 0.03* 0.03*   BNP (last 3 results) No results for input(s): PROBNP in the last 8760 hours. HbA1C: No  results for input(s): HGBA1C in the last 72 hours. CBG:  Recent Labs Lab 03/18/16 1620  GLUCAP 148*   Lipid Profile: No results for input(s): CHOL, HDL, LDLCALC, TRIG, CHOLHDL, LDLDIRECT in the last 72 hours. Thyroid Function Tests: No results for input(s): TSH, T4TOTAL, FREET4, T3FREE, THYROIDAB in the last 72 hours. Anemia Panel:  Recent Labs  03/19/16 0151  VITAMINB12 688  FOLATE 18.0  FERRITIN 481*  TIBC 178*  IRON 93  RETICCTPCT 3.0   Sepsis Labs: No results for input(s): PROCALCITON, LATICACIDVEN in the last 168 hours.  No results found for this or any previous visit (from the past 240 hour(s)).       Radiology Studies: Ct Head Wo Contrast  Result Date: 03/18/2016 CLINICAL DATA:  Fall and unable to get up. EXAM: CT HEAD WITHOUT CONTRAST TECHNIQUE: Contiguous axial images were obtained from the base of the skull through the vertex without intravenous contrast. COMPARISON:  09/08/2003 and 01/04/2015 and MRI 05/22/2010 FINDINGS: Brain: There is extensive low density in the white matter particularly in the parietal white matter. These findings appear to be chronic. Mild cerebral atrophy. No evidence for acute hemorrhage, mass lesion, midline shift, hydrocephalus or large infarct. Vascular: No hyperdense vessel or unexpected calcification. Skull: Partial resection of the right maxillary sinus with a large muscle flap. Multiple surgical clips in this area. Sinuses/Orbits: There is near complete opacification of the right maxillary sinus. There is complete opacification of the left sphenoid sinus. There is some mucosal disease in the ethmoid air cells. Other: None. IMPRESSION: No acute intracranial abnormality. Stable extensive white matter disease probably represents chronic small vessel ischemic disease. Extensive paranasal sinus disease involving the right maxillary sinus and left sphenoid sinus. Chronic postsurgical changes along the right side of the face as described.  Electronically Signed   By: Markus Daft M.D.   On: 03/18/2016 20:33   Ct Angio Chest Pe W/cm &/or Wo Cm  Result Date: 03/18/2016 CLINICAL DATA:  [place] and unable to get up due to weakness. EXAM: CT ANGIOGRAPHY CHEST WITH CONTRAST TECHNIQUE: Multidetector CT imaging of the chest was performed using the standard protocol during bolus administration of intravenous contrast. Multiplanar CT image reconstructions and MIPs were obtained to evaluate the vascular anatomy. CONTRAST:  87.3 mL Isovue 370 COMPARISON:  Chest CT 01/04/2015 FINDINGS: Cardiovascular: Pulmonary arteries are well opacified on this examination. Negative for pulmonary embolism. The ascending thoracic aorta is enlarged measuring up to 4.6 cm and stable. No evidence for aortic dissection. Atherosclerotic disease at the origin of left subclavian artery without significant stenosis. Great vessels are patent. Mediastinum/Nodes: No significant mediastinal, hilar or axillary lymphadenopathy. No significant pericardial fluid. Lungs/Pleura: The trachea and  mainstem bronchi are patent. Again noted is focal air trapping along the medial right lower lobe which is chronic. There is a stable nodule in the right lower lobe that measures roughly 1.9 x 1.3 cm. There are increased densities around this lesion and throughout the right lower lobe which may represent atelectasis. There is dependent atelectasis in the posterior right lower lobe. Focal scarring along the posterior left lung base on sequence 11, image 90. No pneumothorax. Upper Abdomen: Moderate sized hiatal hernia. Hiatal hernia has enlarged since the prior examination. Musculoskeletal: Multilevel degenerative disc disease in the thoracic spine. No acute bone abnormality. Review of the MIP images confirms the above findings. IMPRESSION: Negative for pulmonary embolism. Volume loss and patchy densities in the right lower lobe. Cannot exclude a small focus of infection or aspiration in this area. Fusiform  aneurysm of the ascending thoracic aorta measuring up to 4.6 cm. No evidence for an aortic dissection. Ascending thoracic aortic aneurysm. Recommend semi-annual imaging followup by CTA or MRA and referral to cardiothoracic surgery if not already obtained. This recommendation follows 2010 ACCF/AHA/AATS/ACR/ASA/SCA/SCAI/SIR/STS/SVM Guidelines for the Diagnosis and Management of Patients With Thoracic Aortic Disease. Circulation. 2010; 121: [id number] Stable right lower lobe pulmonary nodule. Moderate size hiatal hernia. Electronically Signed   By: Markus Daft M.D.   On: 03/18/2016 20:48   Ct Knee Right Wo Contrast  Result Date: 03/18/2016 CLINICAL DATA:  Status post syncope and fall. Crawled on the floor for 2 days. Persistent clinical concern for right knee fracture after radiograph. Initial encounter. EXAM: CT OF THE RIGHT KNEE WITHOUT CONTRAST TECHNIQUE: Multidetector CT imaging of the right knee was performed according to the standard protocol. Multiplanar CT image reconstructions were also generated. COMPARISON:  Right knee radiographs performed earlier today at 5:28 p.m. FINDINGS: Bones/Joint/Cartilage There is no evidence of acute fracture or dislocation. A chronic osseous fragment is noted arising at the lateral aspect of the patella, well corticated in appearance. 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 No significant knee joint effusion is seen; trace knee joint fluid remains within normal limits. No significant joint space narrowing is appreciated. The cartilage is not well assessed. Mild chondrocalcinosis is noted. There is minimal marginal osteophyte formation at the medial and lateral compartments, with mild flattening and slight extrusion of the menisci both medially and laterally. Ligaments Suboptimally assessed by CT. The anterior and posterior cruciate ligaments appear grossly intact. The medial collateral ligament and lateral collateral ligament complex are grossly  unremarkable in appearance. Muscles and Tendons The visualized musculature is unremarkable in appearance. The quadriceps and patellar tendons remain grossly intact. A small enthesophyte is seen arising from the upper pole of the patella. Soft tissues The vasculature is not well assessed without contrast. Scattered vascular calcifications are seen. Mild soft tissue edema is seen tracking about the right knee. Hoffa's fat pad is unremarkable in appearance. IMPRESSION: 1. No evidence of acute fracture or dislocation. 2. 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 3. Mild chondrocalcinosis noted. Minimal marginal osteophyte formation at the medial and lateral compartments, with mild flattening and slight extrusion of the menisci both medially and laterally. 4. Mild soft tissue edema noted tracking about the right knee. 5. Scattered vascular calcifications seen. Electronically Signed   By: Garald Balding M.D.   On: 03/18/2016 21:08        Scheduled Meds: . azithromycin  500 mg Oral Q2200  .  brimonidine  1 drop Both Eyes BID   And  . timolol  1 drop Both Eyes BID  . cefTRIAXone (ROCEPHIN)  IV  1 g Intravenous Q24H  . enoxaparin (LOVENOX) injection  40 mg Subcutaneous Q24H  . feeding supplement (ENSURE ENLIVE)  237 mL Oral BID BM  . fluticasone  2 spray Each Nare Daily  . latanoprost  1 drop Both Eyes QHS  . [START ON 03/21/2016] metoprolol tartrate  12.5 mg Oral Daily  . triamcinolone cream   Topical BID  . vitamin A & D       Continuous Infusions:    LOS: 1 day    Time spent: 30 minutes.    Hosie Poisson, MD [facility] Pager [phone] If 7PM-7AM, please contact night-coverage [email or web address] Password [place] 03/20/2016, 5:58 PM

## 2016-03-20 NOTE — Telephone Encounter (Signed)
I received a voicemail from Ms. Bill Salinas, regarding Mr. Antillon.  She shared in her voicemail that he is currently in the hospital and has an appt with Dr. Isidore Moos tomorrow at 10:40 am.  She requested a return call and to be able to speak with Dr. Isidore Moos.   I have forwarded this message to Harlow Asa, RN who will help clarify what Ms. Billman needs to share with Dr. Isidore Moos.   Mike Craze, NP [place] [phone]

## 2016-03-20 NOTE — Progress Notes (Signed)
Physical Therapy Treatment Patient Details Name: Timothy Kim MRN: [id number] DOB: 08/25/1929 Today's Date: 03/20/2016    History of Present Illness  syncope    PT Comments    Assisted pt OOB to amb a greater distance with avg HR 88 and no c/o dizziness.   Follow Up Recommendations  Home health PT     Equipment Recommendations  None recommended by PT    Recommendations for Other Services       Precautions / Restrictions Precautions Precautions: Fall    Mobility  Bed Mobility Overal bed mobility: Needs Assistance Bed Mobility: Supine to Sit Rolling: Supervision;Min guard Sidelying to sit: Supervision;Min guard Supine to sit: Supervision;Min guard Sit to supine: Max assist;Total assist;+2 for physical assistance;Supervision;Min guard Sit to sidelying: Max assist;Total assist;+2 for physical assistance;Supervision;Min guard General bed mobility comments: increased time and use of rail  Transfers Overall transfer level: Needs assistance Equipment used: Rolling walker (2 wheeled) Transfers: Sit to/from Stand Sit to Stand: Supervision;Min guard         General transfer comment: cues for hand placement  Ambulation/Gait Ambulation/Gait assistance: Supervision;Min guard Ambulation Distance (Feet): 125 Feet Assistive device: Rolling walker (2 wheeled) Gait Pattern/deviations: Step-through pattern Gait velocity: WFL   General Gait Details: good alternating gait.  AVG HR 88.  No c/o dizziness.  No syncope.    Stairs            Wheelchair Mobility    Modified Rankin (Stroke Patients Only)       Balance                                    Cognition Arousal/Alertness: Awake/alert Behavior During Therapy: WFL for tasks assessed/performed Overall Cognitive Status: Within Functional Limits for tasks assessed                      Exercises      General Comments        Pertinent Vitals/Pain Pain Assessment: No/denies pain     Home Living                      Prior Function            PT Goals (current goals can now be found in the care plan section) Progress towards PT goals: Progressing toward goals    Frequency    Min 3X/week      PT Plan Current plan remains appropriate    Co-evaluation             End of Session Equipment Utilized During Treatment: Gait belt Activity Tolerance: Patient tolerated treatment well Patient left: in chair;with chair alarm set     Time: 1415-1440 PT Time Calculation (min) (ACUTE ONLY): 25 min  Charges:  $Gait Training: 8-22 mins $Therapeutic Activity: 8-22 mins                    G Codes:      Rica Koyanagi  PTA [facility]  Acute  Rehab Pager      [phone]

## 2016-03-20 NOTE — Progress Notes (Signed)
Spoke with pt concerning discharge needs.  Pt states,"it is to early to tell which direction I am going.  I am waiting for the doctor."

## 2016-03-20 NOTE — Telephone Encounter (Signed)
I received a phone call from Timothy Craze NP. She had received a call from Timothy Kim's significant other Timothy Kim and asked me to call her to speak to her. I called Ms. Timothy Kim and spoke with her and she informed me that Timothy Kim is currently in [facility] on telemetry. Ms. Timothy Kim wanted to let Timothy Kim know this information and tell her how disappointed Timothy Kim was that he would miss his appointment with her tomorrow and that he needed to reschedule. I transferred her to [facility] Engineer, structural) and she was able to reschedule Timothy Kim. Ms. Timothy Kim knows to call me if she has any further questions.

## 2016-03-20 NOTE — Progress Notes (Signed)
Patient Heart rate have been in the 50s metoprolol 50mg  due , Dr. Karleen Hampshire notified and ordered to hold med.

## 2016-03-20 NOTE — Progress Notes (Signed)
PHARMACIST - PHYSICIAN COMMUNICATION DR: Karleen Hampshire CONCERNING: Antibiotic IV to Oral Route Change Policy  RECOMMENDATION: This patient is receiving azithromycin by the intravenous route.  Based on criteria approved by the Pharmacy and Therapeutics Committee, the antibiotic(s) is/are being converted to the equivalent oral dose form(s).   DESCRIPTION: These criteria include:  Patient being treated for a respiratory tract infection, urinary tract infection, cellulitis or clostridium difficile associated diarrhea if on metronidazole  The patient is not neutropenic and does not exhibit a GI malabsorption state  The patient is eating (either orally or via tube) and/or has been taking other orally administered medications for a least 24 hours  The patient is improving clinically and has a Tmax < 100.5  If you have questions about this conversion, please contact the Pharmacy Department  []   [phone] )  Timothy Kim []   [phone] )  [facility] []   [phone] )  [facility] []   [phone] )  [facility] [x]   [phone] )  Bonner Puna   Barkley Boards, PharmD Candidate

## 2016-03-21 ENCOUNTER — Ambulatory Visit
Admission: RE | Admit: 2016-03-21 | Discharge: 2016-03-21 | Disposition: A | Discharge status: Home / Self Care | Payer: Medicare Other | Source: Ambulatory Visit | Attending: Radiation Oncology | Admitting: Radiation Oncology

## 2016-03-21 DIAGNOSIS — I10 Essential (primary) hypertension: Secondary | ICD-10-CM

## 2016-03-21 MED ORDER — TRAMADOL HCL 50 MG PO TABS
50.0000 mg | ORAL_TABLET | Freq: Four times a day (QID) | ORAL | Status: DC | PRN
  Administered 2016-03-21: 50 mg via ORAL
  Filled 2016-03-21: qty 1

## 2016-03-21 NOTE — Progress Notes (Signed)
PROGRESS NOTE    Timothy Kim  [id number] DOB: Jan 21, 1930 DOA: 03/18/2016 PCP: [name], MD    Brief Narrative: Timothy Folgar Coffeyis a 82 y.o.malewith hypertension, diet-controlled diabetes, lower extremity edema on Lasix, history of CLL and oral cancer under observation was brought to the ER afterpatient had difficulty ambulating after syncopal episode.   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  Tele monitor on 9/28 revealed bradycardia and pauses. Metoprolol held on 9/28 and  restarted at  12.5 mg daily . willw atch him on low dose bb overnight .   Echocardiogram revealed LVEF of 65 %, ad grade 1 diastolic dysfunction .  Doppler parameters are consistent with elevated  ventricular end-diastolic filling pressure.   Atypical pneumonia: On IV antibiotics.  Recommend checking repeat CXR in 4 weeks to evaluate for resolution.   Squamous cell ca of the retromolar trigone: currently undergoing radiation.   Anemia: macrocytic.  Stable.    Mild normocytic anemia: Monitor.    Mild leukocytosis: improving, probably from pneumonia.    DVT prophylaxis: (Lovenox) Code Status: (Full) Family Communication: none at bedside.  Disposition Plan: possibly Saturday.    Consultants:  none  Procedures: none   Antimicrobials: rocephin and zithromax.    Subjective: Reports having some back pain after physical therapy. Tramadol added.   Objective: Vitals:   03/20/16 2141 03/21/16 0654 03/21/16 1236 03/21/16 1430  BP: 124/70 140/71 124/86 97/70  Pulse: 70 68 93 91  Resp: 18 (!) 166  19  Temp: 98.2 F (36.8 C) 98.4 F (36.9 C)  98.5 F (36.9 C)    TempSrc: Oral Oral  Oral  SpO2: 100% 100%  100%  Weight:      Height:        Intake/Output Summary (Last 24 hours) at 03/21/16 1605 Last data filed at 03/21/16 1431  Gross per 24 hour  Intake              530 ml  Output             1150 ml  Net             -620 ml   Filed Weights   03/18/16 1613  Weight: 59.1 kg (130 lb 5 oz)    Examination:  General exam: Appears calm and comfortable  Respiratory system: Clear to auscultation. Respiratory effort normal. Cardiovascular system: S1 & S2 heard, RRR. No JVD, murmurs,  No pedal edema. Gastrointestinal system: Abdomen is nondistended, soft and nontender. No organomegaly or masses felt. Normal bowel sounds heard. Central nervous system: Alert and oriented. No focal neurological deficits. Extremities: Symmetric 5 x 5 power. Skin: bruising over the right elbow , arm and knee     Data Reviewed: I have personally reviewed following labs and imaging studies  CBC:  Recent Labs Lab 03/18/16 1807 03/19/16 0151 03/20/16 0935  WBC 12.4* 13.5* 10.6*  NEUTROABS 6.2 6.7  --   HGB 12.0* 11.8* 11.3*  HCT 33.9* 32.9* 32.3*  MCV 105.9* 102.8* 107.7*  PLT 216 198 [id number]   Basic Metabolic Panel:  Recent Labs Lab 03/18/16 1807 03/19/16 0151 03/20/16 0935  NA 134* 133* 135  K 3.6  3.4* 3.7  CL 96* 95* 99*  CO2 29 30 31   GLUCOSE 113* 132* 107*  BUN 11 11 10   CREATININE 0.68 0.59* 0.58*  CALCIUM 8.2* 8.2* 8.1*   GFR: Estimated Creatinine Clearance: 58.5 mL/min (by C-G formula based on SCr of 0.58 mg/dL (L)). Liver Function Tests:  Recent Labs Lab 03/18/16 1807 03/19/16 0151  AST 50* 46*  ALT 26 25  ALKPHOS 76 76  BILITOT 1.6* 1.2  PROT 5.5* 5.5*  ALBUMIN 2.7* 2.7*   No results for input(s): LIPASE, AMYLASE in the last 168 hours. No results for input(s): AMMONIA in the last 168 hours. Coagulation Profile: No results for input(s): INR, PROTIME in the last 168 hours. Cardiac Enzymes:  Recent Labs Lab 03/19/16 0151  03/19/16 0707 03/19/16 1310  CKTOTAL 255  --   --   TROPONINI 0.03* 0.03* 0.03*   BNP (last 3 results) No results for input(s): PROBNP in the last 8760 hours. HbA1C: No results for input(s): HGBA1C in the last 72 hours. CBG:  Recent Labs Lab 03/18/16 1620  GLUCAP 148*   Lipid Profile: No results for input(s): CHOL, HDL, LDLCALC, TRIG, CHOLHDL, LDLDIRECT in the last 72 hours. Thyroid Function Tests: No results for input(s): TSH, T4TOTAL, FREET4, T3FREE, THYROIDAB in the last 72 hours. Anemia Panel:  Recent Labs  03/19/16 0151  VITAMINB12 688  FOLATE 18.0  FERRITIN 481*  TIBC 178*  IRON 93  RETICCTPCT 3.0   Sepsis Labs: No results for input(s): PROCALCITON, LATICACIDVEN in the last 168 hours.  No results found for this or any previous visit (from the past 240 hour(s)).       Radiology Studies: No results found.      Scheduled Meds: . azithromycin  500 mg Oral Q2200  . brimonidine  1 drop Both Eyes BID   And  . timolol  1 drop Both Eyes BID  . cefTRIAXone (ROCEPHIN)  IV  1 g Intravenous Q24H  . enoxaparin (LOVENOX) injection  40 mg Subcutaneous Q24H  . feeding supplement (ENSURE ENLIVE)  237 mL Oral BID BM  . fluticasone  2 spray Each Nare Daily  . latanoprost  1 drop Both Eyes QHS  . metoprolol tartrate  12.5 mg Oral Daily  . triamcinolone cream   Topical BID   Continuous Infusions:    LOS: 2 days    Time spent: 30 minutes.    Hosie Poisson, MD [facility] Pager [phone] If 7PM-7AM, please contact night-coverage [email or web address] Password [email or web address] 03/21/2016, 4:05 PM

## 2016-03-21 NOTE — Progress Notes (Signed)
Physical Therapy Treatment Patient Details Name: Timothy Kim MRN: [id number] DOB: 13-Feb-1930 Today's Date: 03/21/2016    History of Present Illness Timothy Kim is a 82 y.o. male with hypertension, diet-controlled diabetes, lower extremity edema, history of CLL and oral cancer was brought to the ER 03/18/16  after patient had difficulty ambulating after syncopal episode.  Pt reports this to be a second one after the one in July    PT Comments    Pt feeling better.  Assisted OOB to amb a greater distance`.  AVG HR with amb 96.  Tolerated session well.    Follow Up Recommendations  Home health PT     Equipment Recommendations  None recommended by PT    Recommendations for Other Services       Precautions / Restrictions Precautions Precautions: Fall    Mobility  Bed Mobility Overal bed mobility: Needs Assistance Bed Mobility: Supine to Sit     Supine to sit: Supervision;Min guard     General bed mobility comments: increased time and use of rail  Transfers Overall transfer level: Needs assistance Equipment used: Rolling walker (2 wheeled) Transfers: Sit to/from Stand Sit to Stand: Supervision         General transfer comment: good safety cognition  Ambulation/Gait Ambulation/Gait assistance: Supervision;Min guard Ambulation Distance (Feet): 450 Feet Assistive device: Rolling walker (2 wheeled) Gait Pattern/deviations: Step-through pattern Gait velocity: WFL   General Gait Details: good alternating gait.  Avg HR 96.  No c/o dizziness   Stairs            Wheelchair Mobility    Modified Rankin (Stroke Patients Only)       Balance                                    Cognition Arousal/Alertness: Awake/alert Behavior During Therapy: WFL for tasks assessed/performed Overall Cognitive Status: Within Functional Limits for tasks assessed                      Exercises      General Comments        Pertinent  Vitals/Pain Pain Location: overall joint stiffness Pain Descriptors / Indicators: Aching Pain Intervention(s): Monitored during session;Repositioned    Home Living                      Prior Function            PT Goals (current goals can now be found in the care plan section) Progress towards PT goals: Progressing toward goals    Frequency    Min 3X/week      PT Plan Current plan remains appropriate    Co-evaluation             End of Session Equipment Utilized During Treatment: Gait belt Activity Tolerance: Patient tolerated treatment well Patient left: in chair;with chair alarm set     Time: [id number] PT Time Calculation (min) (ACUTE ONLY): 24 min  Charges:  $Gait Training: 8-22 mins $Therapeutic Activity: 8-22 mins                    G Codes:      Rica Koyanagi  PTA [facility]  Acute  Rehab Pager      [phone]

## 2016-03-22 MED ORDER — METOPROLOL SUCCINATE ER 25 MG PO TB24: 25.0000 mg | ORAL_TABLET | Freq: Every day | ORAL | 0 refills | Status: DC

## 2016-03-22 MED ORDER — LEVOFLOXACIN 500 MG PO TABS: 500.0000 mg | ORAL_TABLET | Freq: Every day | ORAL | 0 refills | Status: DC

## 2016-03-22 MED ORDER — ENSURE ENLIVE PO LIQD: 237.0000 mL | Freq: Two times a day (BID) | ORAL | 12 refills | Status: DC

## 2016-03-22 MED ORDER — TRAMADOL HCL 50 MG PO TABS: 50.0000 mg | ORAL_TABLET | Freq: Four times a day (QID) | ORAL | 0 refills | Status: DC | PRN

## 2016-03-22 NOTE — Care Management Important Message (Signed)
Important Message  Patient Details  Name: Timothy Kim MRN: [id number] Date of Birth: [birth year]   Medicare Important Message Given:  Yes    Erenest Rasher, RN 03/22/2016, 2:31 PM

## 2016-03-22 NOTE — Care Management Note (Addendum)
Case Management Note  Patient Details  Name: Timothy Kim MRN: [id number] Date of Birth: 1930-01-05  Subjective/Objective:     Syncope, HTN, cancer                Action/Plan: Discharge Planning: AVS reviewed:  NCM spoke to pt and son, [name] # [phone] at bedside. Pt lives at home with family. Has RW at home. Requesting 3n1 bedside commode for home. Contacted [facility] DME rep for equipment. Offered choice for HH/provided [facility] list. Requested [facility] for [facility]. Contacted [facility] Liaison with new referral. Dtr, Donnetta Simpers is contact at # [phone]. Contacted Dr Karleen Hampshire to clarify abx. Per [facility], pt has Septa DS Rx waiting at pharmacy per son prescribed by ENT. MD will update dc instructions.   PCP Marletta Lor MD   Expected Discharge Date:  03/22/2016              Expected Discharge Plan:  [place]  In-House Referral:  NA  Discharge planning Services  CM Consult  Post Acute Care Choice:  Home Health Choice offered to:  Patient  DME Arranged:  3-N-1 DME Agency:  [place]:  PT, Nurse's Aide, RN [facility] Agency:  [facility] ([facility])  Status of Service:  Completed, signed off  If discussed at [place] of Stay Meetings, dates discussed:    Additional Comments:  Erenest Rasher, RN 03/22/2016, 2:22 PM

## 2016-03-24 NOTE — Discharge Summary (Signed)
Physician Discharge Summary  Timothy Kim [id number] DOB: 1930-02-05 DOA: 03/18/2016  PCP: [name], MD  Admit date: 03/18/2016 Discharge date: 03/22/2016  Admitted From: Home.  Disposition:  HOme.   Recommendations for Outpatient Follow-up:  1. Follow up with PCP in 1-2 weeks 2. Please obtain BMP/CBC in one week 3. Please follow up with cardiology for holter monitor placement.  4. Please follow up with ENT to see if he still needs to be on septra after the completion of levaquin.    Home Health:(YES )  Discharge Condition:stable.  CODE STATUS:full code.  Diet recommendation: Heart Healthy   Brief/Interim Summary: Altus Smedberg Coffeyis a 82 y.o.malewith hypertension, diet-controlled diabetes, lower extremity edema on Lasix, history of CLL and oral cancer under observation was brought to the ER afterpatient had difficulty ambulating after syncopal episode.  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  Tele monitor on 9/28 revealed bradycardia and pauses. Metoprolol held on 9/28 and  restarted at  25 mg on discharge. Echocardiogram revealed LVEF of 65 %, ad grade 1 diastolic dysfunction . Recommend outpatient cardiology follow up with a holter monitor placement.  Doppler parameters are consistent with elevated  ventricular end-diastolic filling pressure.   Atypical pneumonia: On IV antibiotics.  Recommend checking repeat CXR in 4 weeks to evaluate for resolution.   Squamous cell ca of the retromolar trigone: currently undergoing radiation.   Anemia: macrocytic.  Stable.   Mild leukocytosis: improving, probably from pneumonia.    Discharge  Instructions  Discharge Instructions    Diet - low sodium heart healthy    Complete by:  As directed    Discharge instructions    Complete by:  As directed    Please follow up with PCP in one week.  Please follow up with cardiology in 1 to 2 weeks for holter monitor placement. Please hold septra till you finish the levaquin , follow up with ENT either over the phone or in office, to see if you still need to take septra.       Medication List    STOP taking these medications   furosemide 20 MG tablet Commonly known as:  LASIX   naproxen sodium 220 MG tablet Commonly known as:  ANAPROX   potassium chloride SA 20 MEQ tablet Commonly known as:  K-DUR,KLOR-CON     TAKE these medications   brimonidine-timolol 0.2-0.5 % ophthalmic solution Commonly known as:  COMBIGAN Place 1 drop into both eyes 2 (two) times daily. Notes to patient:  Brimonidine and timolol    DHEA 50 MG Tabs Take 1 tablet by mouth daily.   feeding supplement (ENSURE ENLIVE) Liqd Take 237 mLs by mouth 2 (two) times daily between meals.   fluticasone 50 MCG/ACT nasal spray Commonly known as:  FLONASE Place 2 sprays into both nostrils daily.   Krill Oil 300 MG Caps Take 300 mg by mouth daily.   latanoprost 0.005 % ophthalmic solution Commonly known as:  XALATAN Place 1 drop into both eyes every evening.   levofloxacin 500 MG tablet Commonly known as:  LEVAQUIN Take 1 tablet (500 mg total) by mouth daily.   Lutein-Zeaxanthin 15-0.7 MG Caps Take 1 tablet by mouth daily.  Melatonin 10 MG Tabs Take 10 mg by mouth at bedtime as needed (for sleep).   metoprolol succinate 25 MG 24 hr tablet Commonly known as:  TOPROL XL Take 1 tablet (25 mg total) by mouth daily. What changed:  medication strength  how much to take  how to take this  when to take this  additional instructions  Another medication with the same name was removed. Continue taking this medication, and follow the directions you  see here.   omeprazole 40 MG capsule Commonly known as:  PRILOSEC Take 1 capsule (40 mg total) by mouth daily.   PRESCRIPTION MEDICATION Take 1 tablet by mouth daily. Chemo medication by mouth 1 time a day   sodium chloride 0.65 % nasal spray Commonly known as:  OCEAN Place 2 sprays into the nose as needed for congestion.   sodium fluoride 1.1 % Crea dental cream Commonly known as:  DENTA 5000 PLUS Apply a thin ribbon of gel to tooth brush. Brush teeth for 2 minutes. Spit out excess. Repeat nightly. What changed:  how much to take  how to take this  when to take this  additional instructions   traMADol 50 MG tablet Commonly known as:  ULTRAM Take 1 tablet (50 mg total) by mouth every 6 (six) hours as needed for moderate pain.   triamcinolone cream 0.1 % Commonly known as:  KENALOG APPLY TO AFFECTED AREA TWICE DAILY What changed:  See the new instructions.   zolpidem 5 MG tablet Commonly known as:  AMBIEN TAKE 1 TABLET BY MOUTH AT BEDTIME AS NEEDED FOR SLEEP      Follow-up Information    [name], MD. Schedule an appointment as soon as possible for a visit in 1 week(s).   Specialty:  Internal Medicine Contact information: [place] [address] [phone]        [facility] Follow up on 03/24/2016.   Specialty:  Cardiology Why:  for holter monitor placement.  Contact information: [address] [place] .   Why:  Home Health Physical Therapy, RN and aide Contact information: [place] [address] [phone]          No Known Allergies  Consultations:  none   Procedures/Studies: Dg Chest 2 View  Result Date: 03/18/2016 CLINICAL DATA:  Status post fall 2-3 days ago. EXAM: CHEST  2 VIEW COMPARISON:  CT chest 01/04/2015 FINDINGS: There is a stable 17 mm right basilar pulmonary  nodule unchanged compared with the prior exam. There is no focal parenchymal opacity. There is no pleural effusion or pneumothorax. The heart and mediastinal contours are unremarkable. Osteolysis of the distal right clavicle unchanged from the prior exam likely secondary to prior resection or secondary to prior trauma. Chronic right rotator cuff tear. Mild osteoarthritis of the right glenohumeral joint. IMPRESSION: No active cardiopulmonary disease. Electronically Signed   By: Kathreen Devoid   On: 03/18/2016 18:07   Dg Elbow Complete Left  Result Date: 03/18/2016 CLINICAL DATA:  Syncope.  Fall.  Left elbow skin tear. EXAM: LEFT ELBOW - COMPLETE 3+ VIEW COMPARISON:  None. FINDINGS: There is no evidence of fracture, dislocation, or joint effusion. There is no evidence of arthropathy or other focal bone abnormality. Soft tissues are unremarkable. IMPRESSION: Negative. Electronically Signed   By: Kerby Moors M.D.   On: 03/18/2016 18:08   Ct Head  Wo Contrast  Result Date: 03/18/2016 CLINICAL DATA:  Fall and unable to get up. EXAM: CT HEAD WITHOUT CONTRAST TECHNIQUE: Contiguous axial images were obtained from the base of the skull through the vertex without intravenous contrast. COMPARISON:  09/08/2003 and 01/04/2015 and MRI 05/22/2010 FINDINGS: Brain: There is extensive low density in the white matter particularly in the parietal white matter. These findings appear to be chronic. Mild cerebral atrophy. No evidence for acute hemorrhage, mass lesion, midline shift, hydrocephalus or large infarct. Vascular: No hyperdense vessel or unexpected calcification. Skull: Partial resection of the right maxillary sinus with a large muscle flap. Multiple surgical clips in this area. Sinuses/Orbits: There is near complete opacification of the right maxillary sinus. There is complete opacification of the left sphenoid sinus. There is some mucosal disease in the ethmoid air cells. Other: None. IMPRESSION: No acute intracranial  abnormality. Stable extensive white matter disease probably represents chronic small vessel ischemic disease. Extensive paranasal sinus disease involving the right maxillary sinus and left sphenoid sinus. Chronic postsurgical changes along the right side of the face as described. Electronically Signed   By: Markus Daft M.D.   On: 03/18/2016 20:33   Ct Angio Chest Pe W/cm &/or Wo Cm  Result Date: 03/18/2016 CLINICAL DATA:  [place] and unable to get up due to weakness. EXAM: CT ANGIOGRAPHY CHEST WITH CONTRAST TECHNIQUE: Multidetector CT imaging of the chest was performed using the standard protocol during bolus administration of intravenous contrast. Multiplanar CT image reconstructions and MIPs were obtained to evaluate the vascular anatomy. CONTRAST:  87.3 mL Isovue 370 COMPARISON:  Chest CT 01/04/2015 FINDINGS: Cardiovascular: Pulmonary arteries are well opacified on this examination. Negative for pulmonary embolism. The ascending thoracic aorta is enlarged measuring up to 4.6 cm and stable. No evidence for aortic dissection. Atherosclerotic disease at the origin of left subclavian artery without significant stenosis. Great vessels are patent. Mediastinum/Nodes: No significant mediastinal, hilar or axillary lymphadenopathy. No significant pericardial fluid. Lungs/Pleura: The trachea and mainstem bronchi are patent. Again noted is focal air trapping along the medial right lower lobe which is chronic. There is a stable nodule in the right lower lobe that measures roughly 1.9 x 1.3 cm. There are increased densities around this lesion and throughout the right lower lobe which may represent atelectasis. There is dependent atelectasis in the posterior right lower lobe. Focal scarring along the posterior left lung base on sequence 11, image 90. No pneumothorax. Upper Abdomen: Moderate sized hiatal hernia. Hiatal hernia has enlarged since the prior examination. Musculoskeletal: Multilevel degenerative disc disease in the  thoracic spine. No acute bone abnormality. Review of the MIP images confirms the above findings. IMPRESSION: Negative for pulmonary embolism. Volume loss and patchy densities in the right lower lobe. Cannot exclude a small focus of infection or aspiration in this area. Fusiform aneurysm of the ascending thoracic aorta measuring up to 4.6 cm. No evidence for an aortic dissection. Ascending thoracic aortic aneurysm. Recommend semi-annual imaging followup by CTA or MRA and referral to cardiothoracic surgery if not already obtained. This recommendation follows 2010 ACCF/AHA/AATS/ACR/ASA/SCA/SCAI/SIR/STS/SVM Guidelines for the Diagnosis and Management of Patients With Thoracic Aortic Disease. Circulation. 2010; 121: [id number] Stable right lower lobe pulmonary nodule. Moderate size hiatal hernia. Electronically Signed   By: Markus Daft M.D.   On: 03/18/2016 20:48   Ct Knee Right Wo Contrast  Result Date: 03/18/2016 CLINICAL DATA:  Status post syncope and fall. Crawled on the floor for 2 days. Persistent clinical concern for right knee fracture after radiograph.  Initial encounter. EXAM: CT OF THE RIGHT KNEE WITHOUT CONTRAST TECHNIQUE: Multidetector CT imaging of the right knee was performed according to the standard protocol. Multiplanar CT image reconstructions were also generated. COMPARISON:  Right knee radiographs performed earlier today at 5:28 p.m. FINDINGS: Bones/Joint/Cartilage There is no evidence of acute fracture or dislocation. A chronic osseous fragment is noted arising at the lateral aspect of the patella, well corticated in appearance. 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 No significant knee joint effusion is seen; trace knee joint fluid remains within normal limits. No significant joint space narrowing is appreciated. The cartilage is not well assessed. Mild chondrocalcinosis is noted. There is minimal marginal osteophyte formation at the medial and lateral compartments,  with mild flattening and slight extrusion of the menisci both medially and laterally. Ligaments Suboptimally assessed by CT. The anterior and posterior cruciate ligaments appear grossly intact. The medial collateral ligament and lateral collateral ligament complex are grossly unremarkable in appearance. Muscles and Tendons The visualized musculature is unremarkable in appearance. The quadriceps and patellar tendons remain grossly intact. A small enthesophyte is seen arising from the upper pole of the patella. Soft tissues The vasculature is not well assessed without contrast. Scattered vascular calcifications are seen. Mild soft tissue edema is seen tracking about the right knee. Hoffa's fat pad is unremarkable in appearance. IMPRESSION: 1. No evidence of acute fracture or dislocation. 2. 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 3. Mild chondrocalcinosis noted. Minimal marginal osteophyte formation at the medial and lateral compartments, with mild flattening and slight extrusion of the menisci both medially and laterally. 4. Mild soft tissue edema noted tracking about the right knee. 5. Scattered vascular calcifications seen. Electronically Signed   By: Garald Balding M.D.   On: 03/18/2016 21:08   Dg Knee Complete 4 Views Left  Result Date: 03/18/2016 CLINICAL DATA:  Status post fall 2-3 days ago EXAM: LEFT KNEE - COMPLETE 4+ VIEW COMPARISON:  None. FINDINGS: No evidence of fracture, dislocation, or joint effusion. Bipartite superolateral patella. No evidence of arthropathy or other focal bone abnormality. Soft tissues are unremarkable. Peripheral vascular atherosclerotic disease. IMPRESSION: No acute osseous injury of the left knee. Electronically Signed   By: Kathreen Devoid   On: 03/18/2016 18:02   Dg Knee Complete 4 Views Right  Result Date: 03/18/2016 CLINICAL DATA:  Fall 2-3 days prior.  Skin tear in the anterior right knee. EXAM: RIGHT KNEE - COMPLETE 4+ VIEW COMPARISON:  None. FINDINGS: Soft tissue swelling in the anterior/lateral right knee. Oblique lucency through the upper outer right patella demonstrates somewhat well corticated margins. No definite joint effusion. No dislocation. No suspicious focal osseous lesion. No additional linear osseous lucencies. No radiopaque foreign body. IMPRESSION: Oblique lucency through the upper outer right patella with somewhat well corticated margins suggesting either a remote fracture or developmental bipartite patella. An acute right knee fracture is unlikely. Correlate with site of pain. If there is clinical concern for an acute right knee fracture, CT or MRI of the right knee is recommended. Electronically Signed   By: Ilona Sorrel M.D.   On: 03/18/2016 18:09       Subjective:  No new complaints Discharge Exam: Vitals:   03/21/16 2212 03/22/16 0559  BP: 135/87 112/75  Pulse: 85 66  Resp: 20 16  Temp: 98.2 F (36.8 C) 98.7 F (37.1 C)   Vitals:  03/21/16 1236 03/21/16 1430 03/21/16 2212 03/22/16 0559  BP: 124/86 97/70 135/87 112/75  Pulse: 93 91 85 66  Resp:  19 20 16   Temp:  98.5 F (36.9 C) 98.2 F (36.8 C) 98.7 F (37.1 C)  TempSrc:  Oral Oral Oral  SpO2:  100% 99% 99%  Weight:      Height:        General: Pt is alert, awake, not in acute distress Cardiovascular: RRR, S1/S2 +, no rubs, no gallops Respiratory: CTA bilaterally, no wheezing, no rhonchi Abdominal: Soft, NT, ND, bowel sounds + Extremities: no edema, no cyanosis    The results of significant diagnostics from this hospitalization (including imaging, microbiology, ancillary and laboratory) are listed below for reference.     Microbiology: No results found for this or any previous visit (from the past 240 hour(s)).   Labs: BNP (last 3 results)  Recent Labs  03/18/16 1807  BNP 123456*   Basic Metabolic Panel:  Recent Labs Lab  03/18/16 1807 03/19/16 0151 03/20/16 0935  NA 134* 133* 135  K 3.6 3.4* 3.7  CL 96* 95* 99*  CO2 29 30 31   GLUCOSE 113* 132* 107*  BUN 11 11 10   CREATININE 0.68 0.59* 0.58*  CALCIUM 8.2* 8.2* 8.1*   Liver Function Tests:  Recent Labs Lab 03/18/16 1807 03/19/16 0151  AST 50* 46*  ALT 26 25  ALKPHOS 76 76  BILITOT 1.6* 1.2  PROT 5.5* 5.5*  ALBUMIN 2.7* 2.7*   No results for input(s): LIPASE, AMYLASE in the last 168 hours. No results for input(s): AMMONIA in the last 168 hours. CBC:  Recent Labs Lab 03/18/16 1807 03/19/16 0151 03/20/16 0935  WBC 12.4* 13.5* 10.6*  NEUTROABS 6.2 6.7  --   HGB 12.0* 11.8* 11.3*  HCT 33.9* 32.9* 32.3*  MCV 105.9* 102.8* 107.7*  PLT 216 198 177   Cardiac Enzymes:  Recent Labs Lab 03/19/16 0151 03/19/16 0707 03/19/16 1310  CKTOTAL 255  --   --   TROPONINI 0.03* 0.03* 0.03*   BNP: Invalid input(s): POCBNP CBG:  Recent Labs Lab 03/18/16 1620  GLUCAP 148*   D-Dimer No results for input(s): DDIMER in the last 72 hours. Hgb A1c No results for input(s): HGBA1C in the last 72 hours. Lipid Profile No results for input(s): CHOL, HDL, LDLCALC, TRIG, CHOLHDL, LDLDIRECT in the last 72 hours. Thyroid function studies No results for input(s): TSH, T4TOTAL, T3FREE, THYROIDAB in the last 72 hours.  Invalid input(s): FREET3 Anemia work up No results for input(s): VITAMINB12, FOLATE, FERRITIN, TIBC, IRON, RETICCTPCT in the last 72 hours. Urinalysis    Component Value Date/Time   COLORURINE AMBER (A) 03/20/2016 1155   APPEARANCEUR CLEAR 03/20/2016 1155   LABSPEC 1.023 03/20/2016 1155   LABSPEC 1.010 06/07/2014 1535   PHURINE 8.0 03/20/2016 1155   GLUCOSEU NEGATIVE 03/20/2016 1155   GLUCOSEU Negative 06/07/2014 1535   HGBUR NEGATIVE 03/20/2016 1155   BILIRUBINUR NEGATIVE 03/20/2016 1155   BILIRUBINUR Negative 06/07/2014 1535   KETONESUR NEGATIVE 03/20/2016 1155   PROTEINUR NEGATIVE 03/20/2016 1155   UROBILINOGEN 0.2  06/07/2014 1535   NITRITE NEGATIVE 03/20/2016 1155   LEUKOCYTESUR SMALL (A) 03/20/2016 1155   LEUKOCYTESUR Trace 06/07/2014 1535   Sepsis Labs Invalid input(s): PROCALCITONIN,  WBC,  LACTICIDVEN Microbiology No results found for this or any previous visit (from the past 240 hour(s)).   Time coordinating discharge: Over 30 minutes  SIGNED:   Hosie Poisson, MD  [facility] 03/24/2016, 9:02 AM Pager   If  7PM-7AM, please contact night-coverage [email or web address] Password [email or web address]

## 2016-03-25 ENCOUNTER — Encounter: Payer: Self-pay | Admitting: *Deleted

## 2016-03-25 NOTE — Progress Notes (Signed)
Oncology Nurse Navigator Documentation  Flowsheet update.  Gayleen Orem, RN, BSN, [facility] [phone]

## 2016-03-26 ENCOUNTER — Other Ambulatory Visit: Payer: Self-pay | Admitting: Internal Medicine

## 2016-03-28 ENCOUNTER — Encounter: Payer: Self-pay | Admitting: Physician Assistant

## 2016-03-28 ENCOUNTER — Ambulatory Visit (INDEPENDENT_AMBULATORY_CARE_PROVIDER_SITE_OTHER): Payer: Medicare Other | Admitting: Physician Assistant

## 2016-03-28 VITALS — BP 124/80 | HR 90 | Ht 68.0 in | Wt 140.0 lb

## 2016-03-28 DIAGNOSIS — C9110 Chronic lymphocytic leukemia of B-cell type not having achieved remission: Secondary | ICD-10-CM

## 2016-03-28 DIAGNOSIS — E43 Unspecified severe protein-calorie malnutrition: Secondary | ICD-10-CM

## 2016-03-28 DIAGNOSIS — I712 Thoracic aortic aneurysm, without rupture: Secondary | ICD-10-CM

## 2016-03-28 DIAGNOSIS — I10 Essential (primary) hypertension: Secondary | ICD-10-CM | POA: Diagnosis not present

## 2016-03-28 DIAGNOSIS — R600 Localized edema: Secondary | ICD-10-CM

## 2016-03-28 DIAGNOSIS — R55 Syncope and collapse: Secondary | ICD-10-CM | POA: Diagnosis not present

## 2016-03-28 DIAGNOSIS — I7121 Aneurysm of the ascending aorta, without rupture: Secondary | ICD-10-CM

## 2016-03-28 HISTORY — DX: Thoracic aortic aneurysm, without rupture: I71.2

## 2016-03-28 HISTORY — DX: Aneurysm of the ascending aorta, without rupture: I71.21

## 2016-03-28 MED ORDER — FUROSEMIDE 20 MG PO TABS: 20.0000 mg | ORAL_TABLET | ORAL | 11 refills | Status: DC

## 2016-03-28 MED ORDER — POTASSIUM CHLORIDE CRYS ER 20 MEQ PO TBCR: 20.0000 meq | EXTENDED_RELEASE_TABLET | ORAL | 11 refills | Status: DC

## 2016-03-28 NOTE — Progress Notes (Signed)
Cardiology Office Note:    Date:  03/28/2016   ID:  Timothy Kim, DOB June 14, 1930, MRN [id number]  PCP:  Timothy Cowden, MD  Cardiologist:  New - seen by Dr. Ena Dawley today Electrophysiologist:  n/a  Referring MD: Timothy Lor, MD   Chief Complaint  Patient presents with  . Loss of Consciousness    s/p admit to Hospital    History of Present Illness:    Timothy Kim is a 82 y.o. male with a hx of CLL, oral CA, bladder CA, HTN, DM, LE edema  Admitted 9/26-9/30 with syncope. He was not seen by Cardiology. Chest CT was suggestive of community-acquired pneumonia.  He was tx with antibiotics.  Echo demonstrated normal EF. CTA did show 4.6 cm ascending thoracic aortic aneurysm. Head CT was non-acute.  Troponin levels were minimally elevated without any trend (0.03 - 0.03 - 0.03).  The DC summary indicates that Tele demonstrated bradycardia and pauses (I cannot find documentation of this in the chart).  His beta-blocker was held then reduced.  He is referred by Timothy Kim of [facility] to evaluated syncope.  He is here today with his partner, Timothy Kim.  He notes 2 episodes of syncope since July.  His first episode was 7/21.  He was walking to his back deck and passed out.  There was no prodrome.  He denies diaphoresis or confusion upon awakening.  He denies chest pain. He is sedentary.  He denies shortness of breath.  He denies orthopnea, PND.  He does note some dizziness with standing. He denies recurrent syncope.    Hospital Date Reviewed: Labs: Hgb 11.3, Creatinine 0.58, K 3.7, Alb 2.7, TP 5.5, ALT 25, BNP 295.7, DDimer 1.40  Chest CTA 03/18/16 IMPRESSION: Negative for pulmonary embolism. Volume loss and patchy densities in the right lower lobe. Cannot exclude a small focus of infection or aspiration in this area. Fusiform aneurysm of the ascending thoracic aorta measuring up to 4.6 cm. No evidence for an aortic dissection. Ascending thoracic  aortic aneurysm. Recommend semi-annual imaging followup by CTA or MRA and referral to cardiothoracic surgery if not already obtained.  Stable right lower lobe pulmonary nodule. Moderate size hiatal hernia.   PAD Screen 03/28/2016  Previous PAD dx? No  Previous surgical procedure? No  Pain with walking? No  Feet/toe relief with dangling? Yes  Painful, non-healing ulcers? No  Extremities discolored? No     Prior CV studies that were reviewed today include:    Echo 03/19/16 Vigorous LVF, EF 65-70%, normal wall motion, Gr 1 DD, normal RVSF, mild TR, PASP 34 mmHg   Past Medical History:  Diagnosis Date  . Alcohol abuse   . Allergic rhinitis   . Anemia in neoplastic disease    chronic  . Bilateral lower extremity edema    feet  . Borderline diabetes   . BPH (benign prostatic hypertrophy)   . CLL (chronic lymphocytic leukemia) [facility]) oncologist-  dr Heath Lark ([facility])   dx Jan 2014 --- Stage 1--  currently asymptomatic as of Apr 2016 and No active disease  . DDD (degenerative disc disease), cervical   . DDD (degenerative disc disease), lumbosacral   . 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 Dysphasia functional--  speech therapy   post op  radical neck dissection 07-21-2014--  changed diet to no chewy food , softer foods , states with the changes swallowed okay without any issues  . Glaucoma    both eyes  . History of gastroesophageal reflux (GERD)   . History of radiation therapy    09-04-2014 to 10-13-2014  Right retromolar region (tumor bed) and right neck/  60Gy in 30 fractions to tumor bed and 54Gy in 30 fractions to neck  . History of radiation to head and neck region 09-04-2014 to  10-13-2014   right retromolar region and right neck  60 Gy  . History of tracheostomy    post op radical  neck dissection  07-21-2014--  post op tracheostomy on 07-25-2014  decannulated and sutured 07-29-2014  . Hypertension   . Macular degeneration    right eye only  . MRSA (methicillin resistant staph aureus) culture positive   . Nephrolithiasis    right side--  per CT from [facility] 01-17-2015 non-obstructing  . OA (osteoarthritis)   . Organic impotence   . Pulmonary nodule    benign  and stable per last CT  . Radiation-induced dermatitis   . Squamous cell carcinoma of retromolar trigone [facility]) oncologist-  dr Isidore Moos   Stage IVB, pT1b, pN0, Grade 2, +PNI, no LVSI---  S/P  RADICAL NECK DISSECTION AND RADIATION  . Thoracic ascending aortic aneurysm ([place]) 03/28/2016   Chest CTA 9/17: 4.6 cm fusiform ascending thoracic aortic aneurysm >> Plan repeat CTA in 3/18  . 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 Transitional cell carcinoma of bladder [facility]) urologist-- dr Junious Silk   High - grade  . Venous insufficiency     Past Surgical History:  Procedure Laterality Date  . CATARACT EXTRACTION W/ INTRAOCULAR LENS  IMPLANT, BILATERAL    . CYSTOSCOPY N/A 02/02/2015   Procedure: CYSTOSCOPY, INSTILLATION OF MITOMYCIN C;  Surgeon: Lowella Bandy, MD;  Location: [facility];  Service: Urology;  Laterality: N/A;  . CYSTOSCOPY WITH BIOPSY N/A 03/27/2015   Procedure: CYSTOSCOPY WITH BLADDER  BIOPSY WITH FULGERATION;  Surgeon: Festus Aloe, MD;  Location: [facility];  Service: Urology;  Laterality: N/A;  . INGUINAL HERNIA REPAIR Bilateral right 1988//  left 1970's  . LAMINECTOMY WITH POSTERIOR LATERAL ARTHRODESIS LEVEL 1  09-01-2003   L3 -5 LAMINECTOMY W/ DECOMPRESSION AND FUSION L4-5  . MAXILLECTOMY Right 07/21/14,   [facility]   Radical Neck Dissection, Maxillectomy, Right Marginal Mandibulectomy, dental extractions, Parascauplar fasciocutaeous Free flap reconstruction  . RETINAL DETACHMENT REPAIR W/ SCLERAL BUCKLE LE  11-03-2000  .  ROTATOR CUFF REPAIR Right 12-11-2000  . tonsil biopsy Right 05/24/14  . TRANSURETHRAL RESECTION OF BLADDER TUMOR N/A 02/02/2015   Procedure: TRANSURETHRAL RESECTION OF BLADDER TUMOR (TURBT);  Surgeon: Lowella Bandy, MD;  Location: [facility];  Service: Urology;  Laterality: N/A;    Current Medications: Current Meds  Medication Sig  . brimonidine-timolol (COMBIGAN) 0.2-0.5 % ophthalmic solution Place 1 drop into both eyes 2 (two) times daily.   Marland Kitchen DHEA 50 MG TABS Take 1 tablet by mouth daily.  . feeding supplement, ENSURE ENLIVE, (ENSURE ENLIVE) LIQD Take 237 mLs by mouth 2 (two) times daily between meals.  . fluticasone (FLONASE) 50 MCG/ACT nasal spray Place 2 sprays into both nostrils daily.  Timothy Kim Oil 300 MG CAPS Take 300 mg by mouth daily.  Marland Kitchen latanoprost (XALATAN) 0.005 % ophthalmic solution Place 1 drop into both eyes every evening.   . Lutein-Zeaxanthin  15-0.7 MG CAPS Take 1 tablet by mouth daily.  . Melatonin 10 MG TABS Take 10 mg by mouth at bedtime as needed (for sleep).  . metoprolol succinate (TOPROL XL) 25 MG 24 hr tablet Take 1 tablet (25 mg total) by mouth daily.  . sodium chloride (OCEAN) 0.65 % nasal spray Place 2 sprays into the nose as needed for congestion.   . sodium fluoride (DENTA 5000 PLUS) 1.1 % CREA dental cream Apply a thin ribbon of gel to tooth brush. Brush teeth for 2 minutes. Spit out excess. Repeat nightly. (Patient taking differently: Place 1 application onto teeth at bedtime. Apply a thin ribbon of gel to tooth brush. Brush teeth for 2 minutes. Spit out excess. Repeat nightly.)  . traMADol (ULTRAM) 50 MG tablet Take 1 tablet (50 mg total) by mouth every 6 (six) hours as needed for moderate pain.  Marland Kitchen triamcinolone cream (KENALOG) 0.1 % Apply 1 application topically 2 (two) times daily.  Marland Kitchen zolpidem (AMBIEN) 5 MG tablet TAKE 1 TABLET BY MOUTH AT BEDTIME AS NEEDED FOR SLEEP     Allergies:   Review of patient's allergies indicates no known allergies.    Social History   Social History  . Marital status: Single    Spouse name: N/A  . Number of children: 2  . Years of education: N/A   Social History Main Topics  . Smoking status: Never Smoker  . Smokeless tobacco: Never Used  . Alcohol use Yes     Comment: Gin and vodka 3-4 daily   (Hx alcohol withdrawal )  . Drug use: No  . Sexual activity: Not on file   Other Topics Concern  . Not on file   Social History Narrative   Patient is divorced with 2 children.   Patient has never smoked. Patient has never used smokeless tobacco.    Patient drinks gin or vodka on a daily basis.   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   ROS:   Please see the history of present illness.    ROS All other systems reviewed and are negative.   EKGs/Labs/Other Test Reviewed:    EKG:  EKG is  ordered today.  The ekg ordered today demonstrates NSR, HR 91, normal axis, low voltage, QTc 455 ms  Recent Labs: 10/16/2015: TSH 1.694 03/18/2016: B Natriuretic Peptide 295.7 03/19/2016: ALT 25 03/20/2016: BUN 10; Creatinine, Ser 0.58; Hemoglobin 11.3; Platelets 177; Potassium 3.7; Sodium 135   Recent Lipid Panel    Component Value Date/Time   CHOL 203 (H) 01/07/2012 0936   TRIG 65.0 01/07/2012 0936   HDL 79.20 01/07/2012 0936   CHOLHDL 3 01/07/2012 0936   VLDL 13.0 01/07/2012 0936   LDLCALC 101 (H) 12/12/2009 0000   LDLDIRECT 104.4 01/07/2012 0936     Physical Exam:    VS:  BP 124/80   Pulse 90   Ht 5\' 8"  (1.727 m)   Wt 140 lb (63.5 kg)   BMI 21.29 kg/m     Orthostatic VS for the past 24 hrs (Last 3 readings):  BP- Lying Pulse- Lying BP- Sitting Pulse- Sitting BP- Standing at 0 minutes Pulse- Standing at 0 minutes BP- Standing at 3 minutes Pulse- Standing at 3 minutes  03/28/16 1237 137/89 90  142/88 94 131/86 107 150/88 101     Wt Readings from Last 3 Encounters:  03/28/16 140 lb (63.5 kg)  03/18/16 130 lb 5 oz (59.1 kg)  01/04/16 143 lb (64.9 kg)     Physical Exam  Constitutional: He is oriented to person, place, and time. He appears well-developed. He appears cachectic. No distress.  HENT:  Head: Normocephalic and atraumatic.  Eyes: No scleral icterus.  Neck: No JVD present.  Cardiovascular: Normal rate, regular rhythm and normal heart sounds.   No murmur heard. Pulmonary/Chest: He has decreased breath sounds. He has no wheezes. He has no rales.  Abdominal: Soft. There is no tenderness.  Musculoskeletal: He exhibits edema.  2+ bilat LE edema   Neurological: He is alert and oriented to person, place, and time.  Skin: Skin is warm and dry.  Psychiatric: He has a normal mood and affect.    ASSESSMENT:    1. Syncope, unspecified syncope type   2. Thoracic ascending aortic aneurysm ([place])   3. Protein-calorie malnutrition, severe ([place])   4. Essential hypertension   5. Bilateral leg edema   6. CLL (chronic lymphocytic leukemia) ([facility])    PLAN:    In order of problems listed above:  1. Syncope - Etiology not clear.  Suspect vaso-vagal as he did have pneumonia and was on Lasix at that the time. However, he has had recurrent syncope.  He does not have a prodrome but has been standing both times this has happened.  His ortho VS do not show a significant BP drop today but his HR does go up some 90 >> 107.  Records indicate his HR was low in the hospital but I do no see any documentation of this.    -  No driving x 6 mos.  -  Event monitor x 30 days.  2. Thoracic aortic aneurysm - Continue beta-blocker.  Arrange FU CTA in 6 mos. Refer to TCTS if any larger. No AI on echo or exam.  3. Protein calorie malnutrition - I suspect his low Alb is contributing to his edema. Work on diet.  He can discuss Megace with his PCP to see if appropriate.   4. HTN - Controlled.  5. Leg  edema - Likely venous insufficiency + low Alb contributing.  His edema is worse since Lasix was stopped in the hospital.  With his HR increasing, I am hesitant to have him take this QD.  Will resume Lasix 20 mg every other day along with K.  BMET in 1 week.   6. CLL - Followed by Oncology.    Medication Adjustments/Labs and Tests Ordered: Current medicines are reviewed at length with the patient today.  Concerns regarding medicines are outlined above.  Medication changes, Labs and Tests ordered today are outlined in the Patient Instructions noted below. Patient Instructions  Medication Instructions:  Restart Furosemide (Lasix) 20 mg every other day.   Take Potassium 20 mEq with the Furosemide (Lasix) every other day.  Labwork: 1 week - BMET   Testing/Procedures: 1. Event Monitor x 30 days 2. Chest CT angiography in 6 mos (3/18)  Follow-Up: Dr. Ena Dawley in 2-3 mos.  Any Other Special Instructions Will Be Listed Below (If Applicable). Ask Timothy Cowden, MD if Megace is a medication you can try for your appetite.  If you need a refill on your cardiac medications before your next appointment, please call your pharmacy.   Signed, Timothy Dopp, PA-C  03/28/2016 12:52 PM    [facility] [place], [place], [place]  [address] Phone: [phone]; Fax: [phone]   The patient was seen, examined and discussed with Timothy Dopp, PA-C and I agree with the above.   TAHJE SAVALA is a 82 y.o. male with a hx of CLL, oral CA, bladder CA, HTN, DM, LE edema, Who is coming for evaluation of recurrent syncope. He was admitted September 26-30 is with a syncope, cardiology was not consulted at the time. It revealed normal LV EF [id number] grade 1 diastolic dysfunction, and only mild tricuspid regurgitation. He was treated for community-acquired pneumonia and discharged home. He had a minimal troponin elevation at the time with flat trend. He is chest CT  showed 4.6 cm ascending aortic aneurysm. Patient had total of 2 syncopal episodes without any warning signs of palpitation chest pain or syncope. On one occasion he was in his backyard walking when he passed out there was no urinary or stool incontinence at the time. The second episodes happen again in July with similar scenario no prodromes his beta blocker dose was decreased after this first episode. Recheck his orthostatics today that were negative. We'll start 30 day event monitor. We will schedule thoracic CT angiography in 6 months to monitor or size of ascending aortic aneurysm. His blood pressure is well controlled. He has chronic lower extremity edema that is most probably multifactorial secondary to low albumin and protein, however he improved in the past with use of Lasix we'll continue Lasix 20 mg daily. Repeat the med in 1 week.  Ena Dawley 03/28/2016

## 2016-03-28 NOTE — Patient Instructions (Signed)
Medication Instructions:  Restart Furosemide (Lasix) 20 mg every other day.   Take Potassium 20 mEq with the Furosemide (Lasix) every other day.  Labwork: 1 week - BMET   Testing/Procedures: 1. Event Monitor x 30 days 2. Chest CT angiography in 6 mos (3/18)  Follow-Up: Dr. Ena Dawley in 2-3 mos.  Any Other Special Instructions Will Be Listed Below (If Applicable). Ask [name], MD if Megace is a medication you can try for your appetite.  If you need a refill on your cardiac medications before your next appointment, please call your pharmacy.

## 2016-04-02 ENCOUNTER — Encounter: Payer: Self-pay | Admitting: Physician Assistant

## 2016-04-03 ENCOUNTER — Telehealth: Payer: Self-pay | Admitting: Internal Medicine

## 2016-04-03 ENCOUNTER — Ambulatory Visit (INDEPENDENT_AMBULATORY_CARE_PROVIDER_SITE_OTHER): Payer: Medicare Other

## 2016-04-03 ENCOUNTER — Other Ambulatory Visit: Payer: Medicare Other | Admitting: *Deleted

## 2016-04-03 DIAGNOSIS — I10 Essential (primary) hypertension: Secondary | ICD-10-CM | POA: Diagnosis not present

## 2016-04-03 DIAGNOSIS — R55 Syncope and collapse: Secondary | ICD-10-CM | POA: Diagnosis not present

## 2016-04-03 DIAGNOSIS — R600 Localized edema: Secondary | ICD-10-CM | POA: Diagnosis not present

## 2016-04-03 LAB — BASIC METABOLIC PANEL
BUN: 5 mg/dL — AB (ref 7–25)
CALCIUM: 8.6 mg/dL (ref 8.6–10.3)
CO2: 27 mmol/L (ref 20–31)
CREATININE: 0.79 mg/dL (ref 0.70–1.11)
Chloride: 101 mmol/L (ref 98–110)
Glucose, Bld: 79 mg/dL (ref 65–99)
POTASSIUM: 4.5 mmol/L (ref 3.5–5.3)
Sodium: 139 mmol/L (ref 135–146)

## 2016-04-03 NOTE — Telephone Encounter (Signed)
Error/ltd ° °

## 2016-04-04 ENCOUNTER — Encounter: Payer: Self-pay | Admitting: Radiation Oncology

## 2016-04-04 ENCOUNTER — Ambulatory Visit
Admission: RE | Admit: 2016-04-04 | Discharge: 2016-04-04 | Disposition: A | Discharge status: Home / Self Care | Payer: Medicare Other | Source: Ambulatory Visit | Attending: Radiation Oncology | Admitting: Radiation Oncology

## 2016-04-04 VITALS — BP 116/82 | HR 54 | Temp 97.5°F | Ht 68.0 in | Wt 142.6 lb

## 2016-04-04 DIAGNOSIS — Z923 Personal history of irradiation: Secondary | ICD-10-CM | POA: Insufficient documentation

## 2016-04-04 DIAGNOSIS — R1310 Dysphagia, unspecified: Secondary | ICD-10-CM | POA: Diagnosis not present

## 2016-04-04 DIAGNOSIS — C148 Malignant neoplasm of overlapping sites of lip, oral cavity and pharynx: Secondary | ICD-10-CM | POA: Diagnosis not present

## 2016-04-04 DIAGNOSIS — R635 Abnormal weight gain: Secondary | ICD-10-CM

## 2016-04-04 DIAGNOSIS — C062 Malignant neoplasm of retromolar area: Secondary | ICD-10-CM | POA: Diagnosis present

## 2016-04-04 IMAGING — PT NM PET TUM IMG INITIAL (PI) SKULL BASE T - THIGH
6 series · 25 of 25 positions shown · non-contrast
Comparison: None.

CLINICAL DATA: Initial treatment strategy for tonsillar carcinoma.

EXAM:
NUCLEAR MEDICINE PET SKULL BASE TO THIGH
TECHNIQUE: 8.5 mCi F-18 FDG was injected intravenously. Full-ring PET imaging
was performed from the skull base to thigh after the radiotracer. CT
data was obtained and used for attenuation correction and anatomic
localization.
FASTING BLOOD GLUCOSE:  Value: 121 mg/dl

[Series 3: pet hn_sk_thigh ac · axial · 5.0mm · 4.07mm/px · z∈[-1356,-496]mm · 7 of 216 slices shown]
[im 1/216]
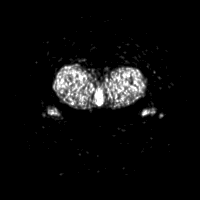
[im 36/216]
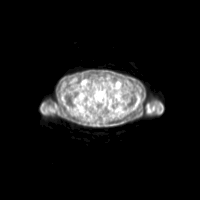
[im 72/216]
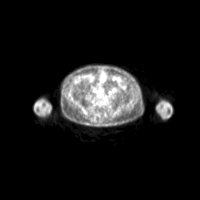
[im 108/216]
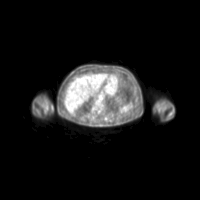
[im 144/216]
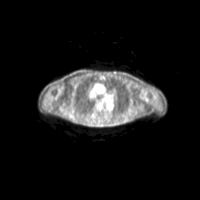
[im 180/216]
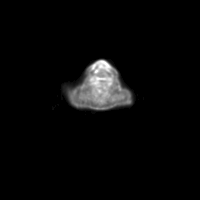
[im 216/216]
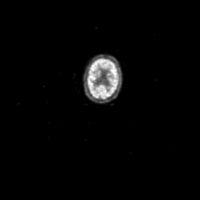

[Series 4: ct hn_sk_th 5.0 b31f · axial · 5.0mm · 0.90mm/px · z∈[-1356,-496]mm · 6 of 216 slices shown]
[im 1/216]
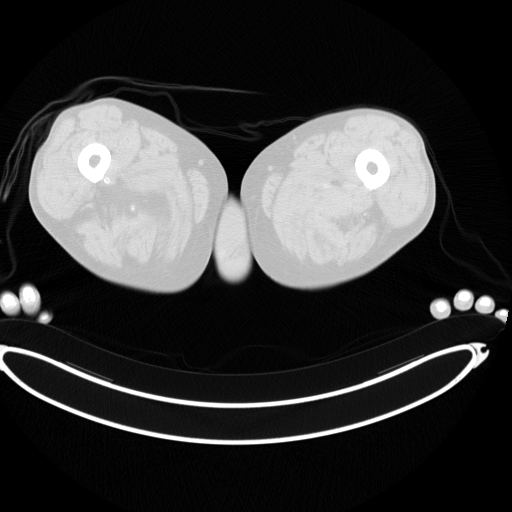
[im 44/216]
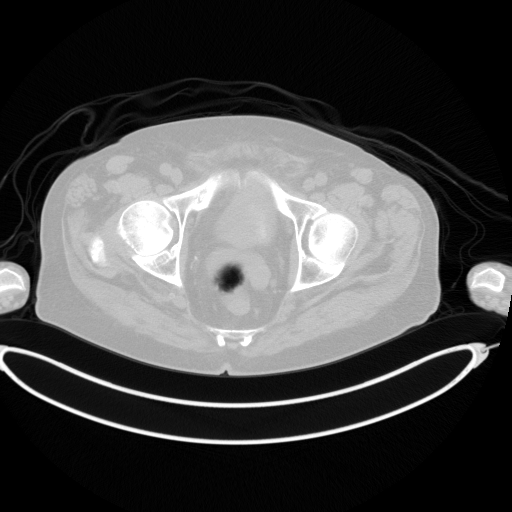
[im 87/216]
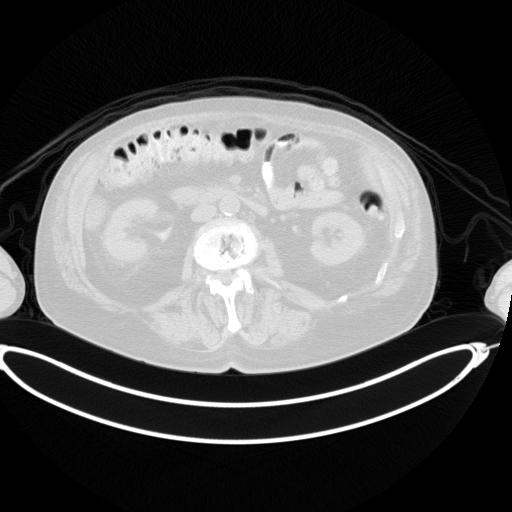
[im 130/216]
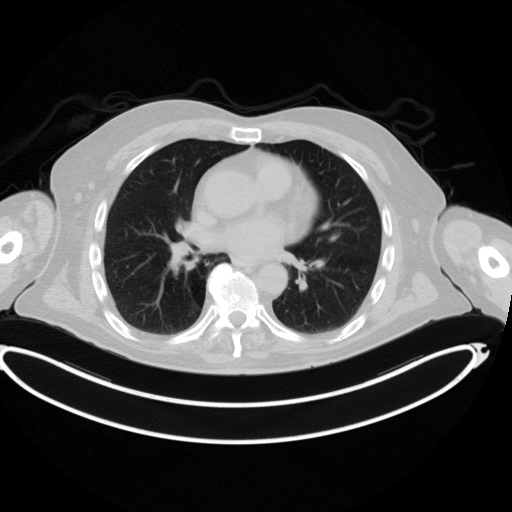
[im 173/216]
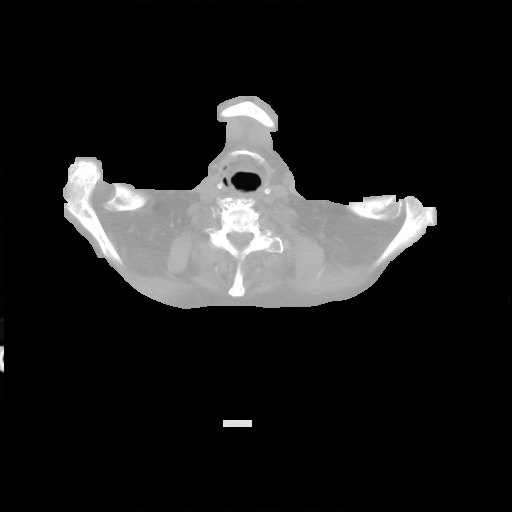
[im 216/216  brain]
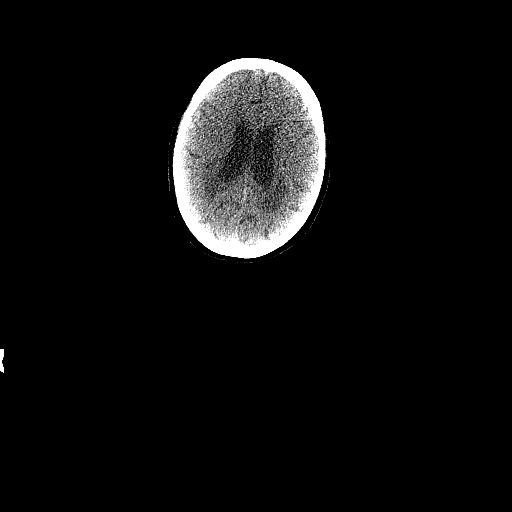

[Series 6: ct hn_sk_th 5.0 b70f (id)_bone · axial · 5.0mm · 0.63mm/px · z∈[-934,-678]mm · 2 of 65 slices shown]
[im 1/65  bone]
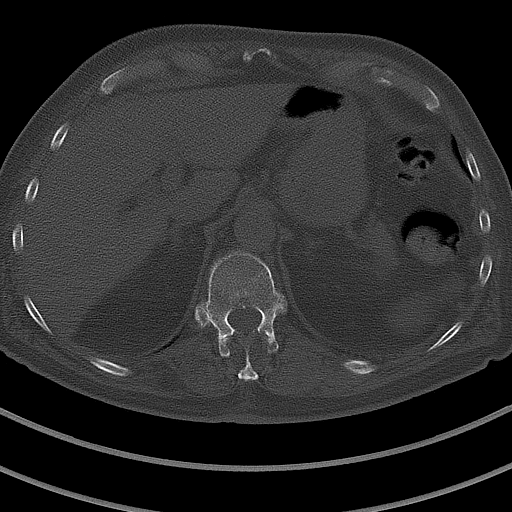
[im 65/65  bone]
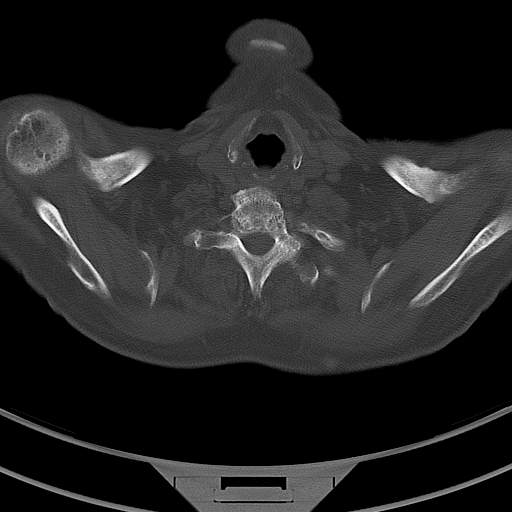

[Series 8: pet hn_sk_thigh nac · axial · 5.0mm · 4.07mm/px · z∈[-1356,-496]mm · 6 of 216 slices shown]
[im 1/216]
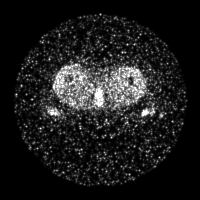
[im 44/216]
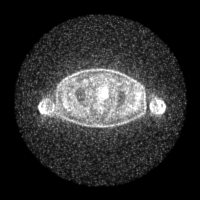
[im 87/216]
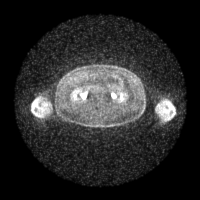
[im 130/216]
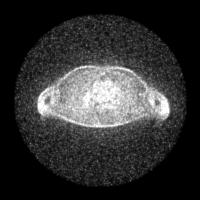
[im 173/216]
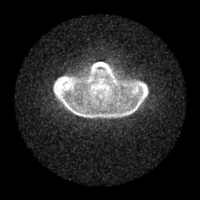
[im 216/216]
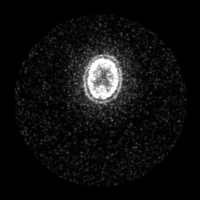

[Series 603: mip collection<mip range> · coronal · 1.79mm/px · 1 of 32 slices shown]
[im 1/32]
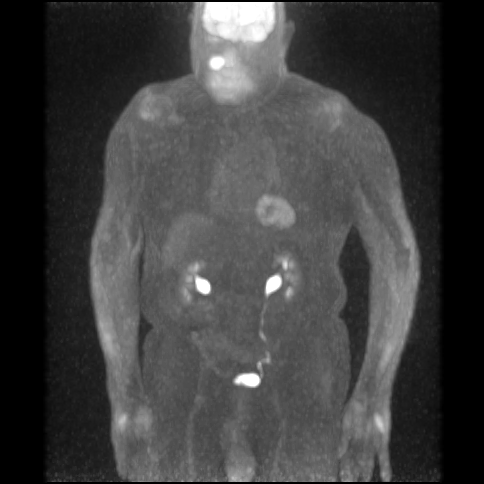

[Series 604: range-ct hn_sk_th 5.0 (id)<alpha range> · 3 of 91 slices shown]
[im 1/91]
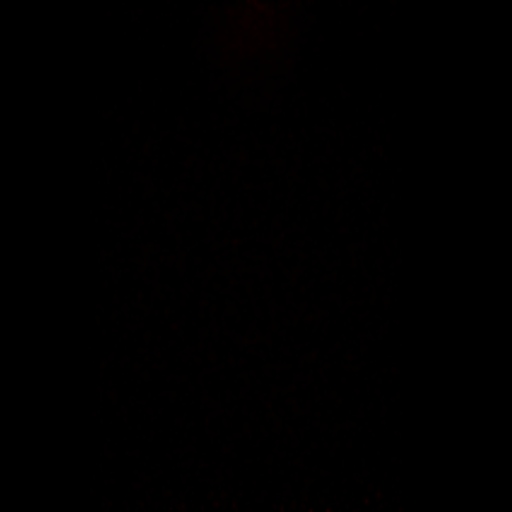
[im 46/91]
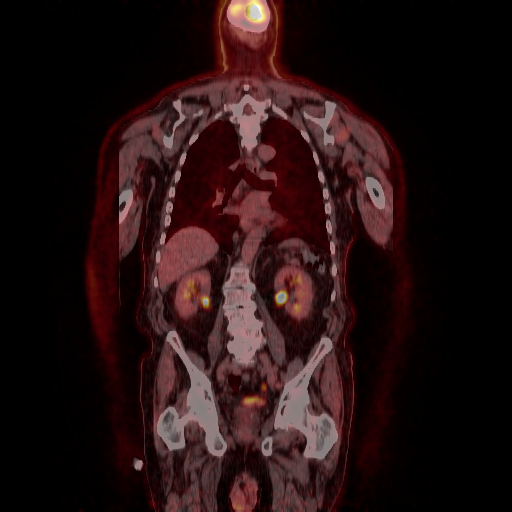
[im 91/91]
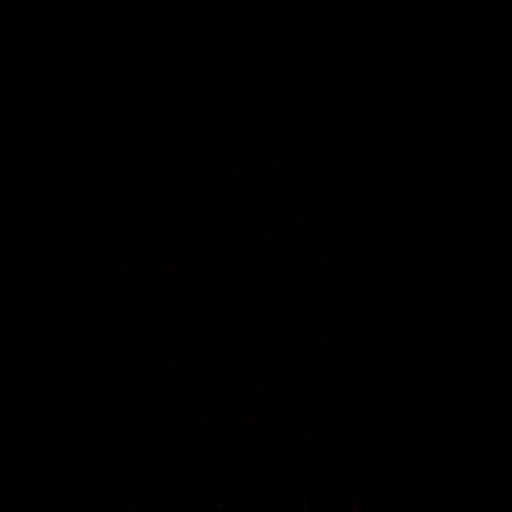

[25 of 25 positions shown; findings below may reference images not displayed]

FINDINGS: NECK

Focal hypermetabolic activity is seen corresponding to asymmetric
soft tissue density in the superior right oropharynx just below the
level of the pterygoid plates. This has an SUV max of 13.5. This is
consistent with known history of primary tonsillar carcinoma.

No hypermetabolic cervical lymph nodes are identified.

CHEST

No hypermetabolic mediastinal or hilar nodes. A 1.8 cm right lower
lobe pulmonary nodule is seen on image 47 which shows absence of
hypermetabolic activity. A 3 mm pulmonary nodule is seen in the
medial left lower lobe on image 52, series 6.

ABDOMEN/PELVIS

No abnormal hypermetabolic activity within the liver, pancreas,
adrenal glands, or spleen. No hypermetabolic lymph nodes in the
abdomen or pelvis.

CT images show asymmetric wall thickening in the urinary bladder.
This could be due to cystitis or neoplasm. Sigmoid diverticulosis
noted, without evidence of diverticulitis.

SKELETON

No focal hypermetabolic activity to suggest skeletal metastasis.
IMPRESSION: Hypermetabolic asymmetric soft tissue density in the superior right
oropharynx, just below the level of the pterygoid plates. This is
consistent with known primary tonsillar carcinoma.

No evidence of hypermetabolic cervical lymph nodes or definite
distant metastatic disease.

1.8 cm right lower lobe nodule shows no metabolic activity,
suggesting benign etiology. Recommend close followup by CT in 3-4
months.

Asymmetric wall thickening of urinary bladder, which could be due to
cystitis or bladder carcinoma. Recommend correlation with urinalysis
and consider cystoscopy for further evaluation.

## 2016-04-04 NOTE — Progress Notes (Signed)
Radiation Oncology         [phone] ________________________________  Name: Timothy Kim MRN: [id number]  Date: 04/04/2016  DOB: 11/11/1929  Follow-Up Visit Note  CC: [name], MD  Philomena Doheny, MD  Diagnosis and Prior Radiotherapy:       ICD-9-CM ICD-10-CM   1. 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  He was recently discharged from the hospital for management of syncope and bradycardia. CT angiogram of the chest revealed a stable right lower lobe pulmonary nodule which was 1.9 cm, stable, compared to CT of the chest in July 2016. In fact, this has been stable since 2004 according to reports and is considered a benign process. CT of the head w/o contrast also on 03/18/16 was negative for acute changes.  Pain issues, if any: No pain. Using a feeding tube?: No Weight changes, if any:     Wt Readings from Last 3 Encounters:  04/04/16 142 lb 9.6 oz (64.7 kg)  03/28/16 140 lb (63.5 kg)  03/18/16 130 lb 5 oz (59.1 kg)   Swallowing issues, if any: He reports difficulty swallowing. He uses a straw to help swallow liquids easier. He needs to swallow several times for liquid to get down. He chews his food well to help him swallow because of his surgery. He is not eating much due to the difficulty and energy required to eat.  Smoking or chewing tobacco? No Using fluoride trays daily? He is using a fluoride toothpaste.  Last ENT visit was on: He has an  appointment in 2 weeks with Dr. Erik Obey. Also follows with Dr Vicie Mutters ENT. Other notable issues, if any:  He is wearing a heart monitor to evaluate a slow heart rate which has caused recent fainting.    ALLERGIES:  has No Known Allergies.  Meds: Current Outpatient Prescriptions  Medication Sig Dispense Refill  . brimonidine-timolol (COMBIGAN) 0.2-0.5 % ophthalmic solution Place 1 drop into both eyes 2 (two) times daily.     Marland Kitchen DHEA 50 MG TABS Take 1 tablet by mouth daily.    . feeding supplement, ENSURE ENLIVE, (ENSURE ENLIVE) LIQD Take 237 mLs by mouth 2 (two) times daily between meals. 237 mL 12  . fluticasone (FLONASE) 50 MCG/ACT nasal spray Place 2 sprays into both nostrils daily.  4  . furosemide (LASIX) 20 MG tablet Take 1 tablet (20 mg total) by mouth every other day. 30 tablet 11  . Krill Oil 300 MG CAPS Take 300 mg by mouth daily.    Marland Kitchen latanoprost (XALATAN) 0.005 % ophthalmic solution Place 1 drop into both eyes every evening.   4  . Lutein-Zeaxanthin 15-0.7 MG CAPS Take 1 tablet by mouth daily.    . Melatonin 10 MG TABS Take 10 mg by mouth at bedtime as needed (for sleep).    . metoprolol succinate (TOPROL XL) 25 MG 24 hr tablet Take 1 tablet (25 mg total) by mouth daily. 30 tablet 0  .  potassium chloride SA (K-DUR,KLOR-CON) 20 MEQ tablet Take 1 tablet (20 mEq total) by mouth every other day. 30 tablet 11  . sodium chloride (OCEAN) 0.65 % nasal spray Place 2 sprays into the nose as needed for congestion.     . sodium fluoride (DENTA 5000 PLUS) 1.1 % CREA dental cream Apply a thin ribbon of gel to tooth brush. Brush teeth for 2 minutes. Spit out excess. Repeat nightly. (Patient taking differently: Place 1 application onto teeth at bedtime. Apply a thin ribbon of gel to tooth brush. Brush teeth for 2 minutes. Spit out excess. Repeat nightly.) 51 g 0  . traMADol (ULTRAM) 50 MG tablet Take 1 tablet (50 mg total) by mouth every 6 (six) hours as needed for moderate pain. 30 tablet 0  .  triamcinolone cream (KENALOG) 0.1 % Apply 1 application topically 2 (two) times daily.    Marland Kitchen zolpidem (AMBIEN) 5 MG tablet TAKE 1 TABLET BY MOUTH AT BEDTIME AS NEEDED FOR SLEEP 30 tablet 2   No current facility-administered medications for this encounter.     Physical Findings: The patient is in no acute distress. Patient is alert and oriented.    Wt Readings from Last 3 Encounters:  04/04/16 142 lb 9.6 oz (64.7 kg)  03/28/16 140 lb (63.5 kg)  03/18/16 130 lb 5 oz (59.1 kg)    height is 5\' 8"  (1.727 m) and weight is 142 lb 9.6 oz (64.7 kg). His temperature is 97.5 F (36.4 C). His blood pressure is 116/82 and his pulse is 54 (abnormal). His oxygen saturation is 97%.    General: Alert and oriented, in no acute distress HEENT: Head is normocephalic. Extraocular movements are intact. Oropharynx is clear. No evidence of recurrence. The graft over his right oral cavity appears healthy. Neck: Neck is supple, no palpable cervical or supraclavicular lymphadenopathy.  Lymph: Neck and right face notable lymphedema. Heart: Regular in rate and rhythm. Chest: Clear to auscultation bilaterally. Heart monitor in place. Abdomen: No tenderness to palpation. Extremities: Edema in his ankles bilaterally. Psychiatric: Judgment and insight are intact. Affect is appropriate. MSK: Uses a cane to ambulate.  Lab Findings: Lab Results  Component Value Date   WBC 10.6 (H) 03/20/2016   HGB 11.3 (L) 03/20/2016   HCT 32.3 (L) 03/20/2016   MCV 107.7 (H) 03/20/2016   PLT 177 03/20/2016   CMP     Component Value Date/Time   NA 139 04/03/2016 1021   NA 135 (L) 10/16/2015 1039   K 4.5 04/03/2016 1021   K 3.0 (LL) 10/16/2015 1039   CL 101 04/03/2016 1021   CO2 27 04/03/2016 1021   CO2 36 (H) 10/16/2015 1039   GLUCOSE 79 04/03/2016 1021   GLUCOSE 129 10/16/2015 1039   BUN 5 (L) 04/03/2016 1021   BUN 11.8 10/16/2015 1039   CREATININE 0.79 04/03/2016 1021   CREATININE 1.1 10/16/2015 1039   CALCIUM 8.6  04/03/2016 1021   CALCIUM 9.9 10/16/2015 1039   PROT 5.5 (L) 03/19/2016 0151   PROT 7.5 10/16/2015 1039   ALBUMIN 2.7 (L) 03/19/2016 0151   ALBUMIN 3.4 (L) 10/16/2015 1039   AST 46 (H) 03/19/2016 0151   AST 49 (H) 10/16/2015 1039   ALT 25 03/19/2016 0151   ALT 20 10/16/2015 1039   ALKPHOS 76 03/19/2016 0151   ALKPHOS 104 10/16/2015 1039   BILITOT 1.2 03/19/2016 0151   BILITOT 0.81 10/16/2015 1039   GFRNONAA >60 03/20/2016 0935   GFRAA >60 03/20/2016 0935  Lab Results  Component Value Date   TSH 1.694 10/16/2015    Radiographic Findings: Dg Chest 2 View  Result Date: 03/18/2016 CLINICAL DATA:  Status post fall 2-3 days ago. EXAM: CHEST  2 VIEW COMPARISON:  CT chest 01/04/2015 FINDINGS: There is a stable 17 mm right basilar pulmonary nodule unchanged compared with the prior exam. There is no focal parenchymal opacity. There is no pleural effusion or pneumothorax. The heart and mediastinal contours are unremarkable. Osteolysis of the distal right clavicle unchanged from the prior exam likely secondary to prior resection or secondary to prior trauma. Chronic right rotator cuff tear. Mild osteoarthritis of the right glenohumeral joint. IMPRESSION: No active cardiopulmonary disease. Electronically Signed   By: Kathreen Devoid   On: 03/18/2016 18:07   Dg Elbow Complete Left  Result Date: 03/18/2016 CLINICAL DATA:  Syncope.  Fall.  Left elbow skin tear. EXAM: LEFT ELBOW - COMPLETE 3+ VIEW COMPARISON:  None. FINDINGS: There is no evidence of fracture, dislocation, or joint effusion. There is no evidence of arthropathy or other focal bone abnormality. Soft tissues are unremarkable. IMPRESSION: Negative. Electronically Signed   By: Kerby Moors M.D.   On: 03/18/2016 18:08   Ct Head Wo Contrast  Result Date: 03/18/2016 CLINICAL DATA:  Fall and unable to get up. EXAM: CT HEAD WITHOUT CONTRAST TECHNIQUE: Contiguous axial images were obtained from the base of the skull through the vertex  without intravenous contrast. COMPARISON:  09/08/2003 and 01/04/2015 and MRI 05/22/2010 FINDINGS: Brain: There is extensive low density in the white matter particularly in the parietal white matter. These findings appear to be chronic. Mild cerebral atrophy. No evidence for acute hemorrhage, mass lesion, midline shift, hydrocephalus or large infarct. Vascular: No hyperdense vessel or unexpected calcification. Skull: Partial resection of the right maxillary sinus with a large muscle flap. Multiple surgical clips in this area. Sinuses/Orbits: There is near complete opacification of the right maxillary sinus. There is complete opacification of the left sphenoid sinus. There is some mucosal disease in the ethmoid air cells. Other: None. IMPRESSION: No acute intracranial abnormality. Stable extensive white matter disease probably represents chronic small vessel ischemic disease. Extensive paranasal sinus disease involving the right maxillary sinus and left sphenoid sinus. Chronic postsurgical changes along the right side of the face as described. Electronically Signed   By: Markus Daft M.D.   On: 03/18/2016 20:33   Ct Angio Chest Pe W/cm &/or Wo Cm  Result Date: 03/18/2016 CLINICAL DATA:  [place] and unable to get up due to weakness. EXAM: CT ANGIOGRAPHY CHEST WITH CONTRAST TECHNIQUE: Multidetector CT imaging of the chest was performed using the standard protocol during bolus administration of intravenous contrast. Multiplanar CT image reconstructions and MIPs were obtained to evaluate the vascular anatomy. CONTRAST:  87.3 mL Isovue 370 COMPARISON:  Chest CT 01/04/2015 FINDINGS: Cardiovascular: Pulmonary arteries are well opacified on this examination. Negative for pulmonary embolism. The ascending thoracic aorta is enlarged measuring up to 4.6 cm and stable. No evidence for aortic dissection. Atherosclerotic disease at the origin of left subclavian artery without significant stenosis. Great vessels are patent.  Mediastinum/Nodes: No significant mediastinal, hilar or axillary lymphadenopathy. No significant pericardial fluid. Lungs/Pleura: The trachea and mainstem bronchi are patent. Again noted is focal air trapping along the medial right lower lobe which is chronic. There is a stable nodule in the right lower lobe that measures roughly 1.9 x 1.3 cm. There are increased densities around this lesion and throughout the right lower lobe which may represent  atelectasis. There is dependent atelectasis in the posterior right lower lobe. Focal scarring along the posterior left lung base on sequence 11, image 90. No pneumothorax. Upper Abdomen: Moderate sized hiatal hernia. Hiatal hernia has enlarged since the prior examination. Musculoskeletal: Multilevel degenerative disc disease in the thoracic spine. No acute bone abnormality. Review of the MIP images confirms the above findings. IMPRESSION: Negative for pulmonary embolism. Volume loss and patchy densities in the right lower lobe. Cannot exclude a small focus of infection or aspiration in this area. Fusiform aneurysm of the ascending thoracic aorta measuring up to 4.6 cm. No evidence for an aortic dissection. Ascending thoracic aortic aneurysm. Recommend semi-annual imaging followup by CTA or MRA and referral to cardiothoracic surgery if not already obtained. This recommendation follows 2010 ACCF/AHA/AATS/ACR/ASA/SCA/SCAI/SIR/STS/SVM Guidelines for the Diagnosis and Management of Patients With Thoracic Aortic Disease. Circulation. 2010; 121: [id number] Stable right lower lobe pulmonary nodule. Moderate size hiatal hernia. Electronically Signed   By: Markus Daft M.D.   On: 03/18/2016 20:48   Ct Knee Right Wo Contrast  Result Date: 03/18/2016 CLINICAL DATA:  Status post syncope and fall. Crawled on the floor for 2 days. Persistent clinical concern for right knee fracture after radiograph. Initial encounter. EXAM: CT OF THE RIGHT KNEE WITHOUT CONTRAST TECHNIQUE: Multidetector  CT imaging of the right knee was performed according to the standard protocol. Multiplanar CT image reconstructions were also generated. COMPARISON:  Right knee radiographs performed earlier today at 5:28 p.m. FINDINGS: Bones/Joint/Cartilage There is no evidence of acute fracture or dislocation. A chronic osseous fragment is noted arising at the lateral aspect of the patella, well corticated in appearance. 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 No significant knee joint effusion is seen; trace knee joint fluid remains within normal limits. No significant joint space narrowing is appreciated. The cartilage is not well assessed. Mild chondrocalcinosis is noted. There is minimal marginal osteophyte formation at the medial and lateral compartments, with mild flattening and slight extrusion of the menisci both medially and laterally. Ligaments Suboptimally assessed by CT. The anterior and posterior cruciate ligaments appear grossly intact. The medial collateral ligament and lateral collateral ligament complex are grossly unremarkable in appearance. Muscles and Tendons The visualized musculature is unremarkable in appearance. The quadriceps and patellar tendons remain grossly intact. A small enthesophyte is seen arising from the upper pole of the patella. Soft tissues The vasculature is not well assessed without contrast. Scattered vascular calcifications are seen. Mild soft tissue edema is seen tracking about the right knee. Hoffa's fat pad is unremarkable in appearance. IMPRESSION: 1. No evidence of acute fracture or dislocation. 2. 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 3. Mild chondrocalcinosis noted. Minimal marginal osteophyte formation at the medial and lateral compartments, with mild flattening and slight extrusion of the menisci both medially and laterally. 4.  Mild soft tissue edema noted tracking about the right knee. 5. Scattered vascular calcifications seen. Electronically Signed   By: Garald Balding M.D.   On: 03/18/2016 21:08   Dg Knee Complete 4 Views Left  Result Date: 03/18/2016 CLINICAL DATA:  Status post fall 2-3 days ago EXAM: LEFT KNEE - COMPLETE 4+ VIEW COMPARISON:  None. FINDINGS: No evidence of fracture, dislocation, or joint effusion. Bipartite superolateral patella. No evidence of arthropathy or other focal bone abnormality. Soft tissues are unremarkable. Peripheral vascular atherosclerotic disease. IMPRESSION: No acute osseous injury of the left  knee. Electronically Signed   By: Kathreen Devoid   On: 03/18/2016 18:02   Dg Knee Complete 4 Views Right  Result Date: 03/18/2016 CLINICAL DATA:  Fall 2-3 days prior. Skin tear in the anterior right knee. EXAM: RIGHT KNEE - COMPLETE 4+ VIEW COMPARISON:  None. FINDINGS: Soft tissue swelling in the anterior/lateral right knee. Oblique lucency through the upper outer right patella demonstrates somewhat well corticated margins. No definite joint effusion. No dislocation. No suspicious focal osseous lesion. No additional linear osseous lucencies. No radiopaque foreign body. IMPRESSION: Oblique lucency through the upper outer right patella with somewhat well corticated margins suggesting either a remote fracture or developmental bipartite patella. An acute right knee fracture is unlikely. Correlate with site of pain. If there is clinical concern for an acute right knee fracture, CT or MRI of the right knee is recommended. Electronically Signed   By: Ilona Sorrel M.D.   On: 03/18/2016 18:09    Impression/Plan:     1) Head and Neck Cancer Status: No evidence of recurrence    2) Nutritional Status: Gaining weight. Has trouble masticating.  I printed off recipes for pureed food.  Discussed other nutritious options w/ him and his wife. Wt Readings from Last 3 Encounters:  04/04/16 142 lb 9.6 oz (64.7 kg)    03/28/16 140 lb (63.5 kg)  03/18/16 130 lb 5 oz (59.1 kg)    3) Risk Factors: The patient has been educated about risk factors including alcohol and tobacco abuse; they understand that avoidance of alcohol and tobacco is important to prevent recurrences as well as other cancers  4) Swallowing: Complaints of trouble swallowing. The patient has been discharged from [facility] on 07/30/15. I encouraged the patient to continuing eating softer foods.  5) Dental: The patient has been encouraged to continue regular followup with dentistry, and dental hygiene maintenance.  6) Thyroid function: The patient understands the importance of monitoring his thyroid levels and function in regards to the management of his disease. Check yearly  Lab Results  Component Value Date   TSH 1.694 10/16/2015    7) Social: No active social issues to address at this time.  8) I offered the patient a referral to physical therapy for his neck and facial lymphedema. He reports going to physical therapy and given a face mask, but he is claustrophobic. He also has limited ROM concerning his lower back and shoulders. I will refer him back to physical therapy.  9) He will follow up with Renato Battles, NP in 6 months. He is scheduled to see Dr. Erik Obey and Dr. Vicie Mutters within the next month. The patient understands that he can access his appointments and medical records via [place]. All vocalized questions and concerns have been addressed.  25 minutes spent face to face with patient, over 50% on counseling and care coordination.  _____________________________   Eppie Gibson, MD  This document serves as a record of services personally performed by Eppie Gibson, MD. It was created on her behalf by Darcus Austin, a trained medical scribe. The creation of this record is based on the scribe's personal observations and the provider's statements to them. This document has been checked and approved by the attending provider.

## 2016-04-04 NOTE — Progress Notes (Signed)
[name] is here for follow up of radiation completed 10/13/14 to his Right Retromolar region.   Pain issues, if any: No Using a feeding tube?: No Weight changes, if any:  Wt Readings from Last 3 Encounters:  04/04/16 142 lb 9.6 oz (64.7 kg)  03/28/16 140 lb (63.5 kg)  03/18/16 130 lb 5 oz (59.1 kg)   Swallowing issues, if any: He reports difficulty swallowing. He uses a straw to help swallow liquids easier. He needs to swallow several times for liquid to get down. He chews his food well to help him swallow because of his surgery. He is not eating much due to the difficulty and energy required to eat.  Smoking or chewing tobacco? No Using fluoride trays daily? He is using a fluoride toothpaste.  Last ENT visit was on: He has an appointment in 2 weeks with Dr. Erik Obey.  Other notable issues, if any:  He is wearing a heart monitor to evaluate a slow heart rate which has caused recent fainting.  He has lower extremity edema present today.   BP 116/82   Pulse (!) 54   Temp 97.5 F (36.4 C)   Ht 5\' 8"  (1.727 m)   Wt 142 lb 9.6 oz (64.7 kg)   SpO2 97% Comment: room air  BMI 21.68 kg/m

## 2016-04-07 ENCOUNTER — Telehealth: Payer: Self-pay | Admitting: *Deleted

## 2016-04-07 ENCOUNTER — Other Ambulatory Visit: Payer: Self-pay | Admitting: Physician Assistant

## 2016-04-07 NOTE — Telephone Encounter (Signed)
CALLED PATIENT TO INFORM OF LAB ON 10-03-16 @ 9 AM AND AN APPT. WITH GRETCHEN DAWSON ON 10-03-16 @ 9:30 AM, SPOKE WITH PATIENT AND HE IS AWARE OF THESE APPTS.

## 2016-04-09 ENCOUNTER — Other Ambulatory Visit: Payer: Self-pay | Admitting: *Deleted

## 2016-04-09 ENCOUNTER — Other Ambulatory Visit: Payer: Self-pay | Admitting: Physician Assistant

## 2016-04-09 ENCOUNTER — Ambulatory Visit: Payer: Medicare Other | Admitting: Physical Therapy

## 2016-04-09 MED ORDER — METOPROLOL SUCCINATE ER 25 MG PO TB24: 25.0000 mg | ORAL_TABLET | Freq: Every day | ORAL | 6 refills | Status: DC

## 2016-04-09 NOTE — Telephone Encounter (Signed)
metoprolol succinate (TOPROL XL) 25 MG 24 hr tablet  Medication  Date: 04/09/2016 Department: [address] Office Ordering/Authorizing: Liliane Shi, PA-C  Order Providers   Prescribing Provider Encounter Provider  Liliane Shi, PA-C [name], CMA  Medication Detail    Disp Refills Start End   metoprolol succinate (TOPROL XL) 25 MG 24 hr tablet 30 tablet 6 04/09/2016    Sig - Route: Take 1 tablet (25 mg total) by mouth daily. - Oral   E-Prescribing Status: Receipt confirmed by pharmacy (04/09/2016 8:46 AM EDT)   Pharmacy   [id number] - [place], [address] AT [address]

## 2016-04-10 ENCOUNTER — Other Ambulatory Visit: Payer: Self-pay | Admitting: *Deleted

## 2016-04-10 DIAGNOSIS — I10 Essential (primary) hypertension: Secondary | ICD-10-CM

## 2016-04-10 DIAGNOSIS — R600 Localized edema: Secondary | ICD-10-CM

## 2016-04-10 MED ORDER — POTASSIUM CHLORIDE CRYS ER 20 MEQ PO TBCR: 20.0000 meq | EXTENDED_RELEASE_TABLET | ORAL | 3 refills | Status: DC

## 2016-04-14 ENCOUNTER — Ambulatory Visit (INDEPENDENT_AMBULATORY_CARE_PROVIDER_SITE_OTHER): Payer: Medicare Other | Admitting: Internal Medicine

## 2016-04-14 ENCOUNTER — Encounter: Payer: Self-pay | Admitting: Internal Medicine

## 2016-04-14 VITALS — BP 128/82 | HR 67 | Temp 97.6°F | Ht 68.0 in | Wt 140.2 lb

## 2016-04-14 DIAGNOSIS — Z23 Encounter for immunization: Secondary | ICD-10-CM

## 2016-04-14 DIAGNOSIS — D649 Anemia, unspecified: Secondary | ICD-10-CM | POA: Diagnosis not present

## 2016-04-14 DIAGNOSIS — I10 Essential (primary) hypertension: Secondary | ICD-10-CM

## 2016-04-14 LAB — BASIC METABOLIC PANEL
BUN: 8 mg/dL (ref 6–23)
CALCIUM: 9.2 mg/dL (ref 8.4–10.5)
CO2: 32 mEq/L (ref 19–32)
CREATININE: 0.86 mg/dL (ref 0.40–1.50)
Chloride: 96 mEq/L (ref 96–112)
GFR: 90.05 mL/min (ref >60.00)
GLUCOSE: 120 mg/dL — AB (ref 70–99)
Potassium: 4.8 mEq/L (ref 3.5–5.1)
Sodium: 136 mEq/L (ref 135–145)

## 2016-04-14 LAB — CBC WITH DIFFERENTIAL/PLATELET
BASOS PCT: 0.4 % (ref 0.0–3.0)
Basophils Absolute: 0 10*3/uL (ref 0.0–0.1)
EOS PCT: 0.2 % (ref 0.0–5.0)
Eosinophils Absolute: 0 10*3/uL (ref 0.0–0.7)
HEMATOCRIT: 36.5 % — AB (ref 39.0–52.0)
HEMOGLOBIN: 12.4 g/dL — AB (ref 13.0–17.0)
LYMPHS PCT: 35 % (ref 12.0–46.0)
Lymphs Abs: 3.6 10*3/uL (ref 0.7–4.0)
MCHC: 34.1 g/dL (ref 30.0–36.0)
MCV: 109.9 fl — AB (ref 78.0–100.0)
Monocytes Absolute: 0.5 10*3/uL (ref 0.1–1.0)
Monocytes Relative: 5 % (ref 3.0–12.0)
Neutro Abs: 6.1 10*3/uL (ref 1.4–7.7)
Neutrophils Relative %: 59.4 % (ref 43.0–77.0)
Platelets: 182 10*3/uL (ref 150.0–400.0)
RBC: 3.32 Mil/uL — ABNORMAL LOW (ref 4.22–5.81)
RDW: 15.2 % (ref 11.5–15.5)
WBC: 10.3 10*3/uL (ref 4.0–10.5)

## 2016-04-14 NOTE — Patient Instructions (Signed)
Keep legs elevated as much as possible  Limit your sodium (Salt) intake  His compression stockings  ENT follow-up as scheduled  Return here in 4 weeks for follow-up

## 2016-04-14 NOTE — Progress Notes (Signed)
Subjective:    Patient ID: Timothy Kim, male    DOB: 01-03-1930, 82 y.o.   MRN: [id number]  HPI  82 year old patient who is seen following a recent hospital discharge.  He was admitted for evaluation of syncope.  He is hospitalized for 4 days and discharged approximately 3 weeks ago. He has been seen by ENT and is completing antibiotics for chronic sinusitis. His main complaint today is worsening lower extremity edema.  He has resumed furosemide. The patient was admitted for generalized weakness associated with syncope in the setting of community-acquired pneumonia.  Evaluation revealed significant bradycardia and metoprolol was discontinued on September 28 and restarted on a lower dose.  Echocardiogram revealed a normal ejection fraction of 65%.  Evaluation also included a chest CTA.  The revealed no evidence of acute pulmonary embolism.  He also had evaluation of both knees without evidence of fracture.  BNP was normal.  His lower extremity edema has been chronic.  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  He has a history of spinal stenosis and chronic low back pain.  Chest CTA was negative for pulmonary embolism but did suggest a small focus of infection in the right lower lobe.  The patient was treated with ceftriaxone and azithromycin  Past Medical History:  Diagnosis Date  . Alcohol abuse   . Allergic rhinitis   . Anemia in neoplastic disease    chronic  . Bilateral lower extremity edema    feet  . Borderline diabetes   . BPH (benign prostatic hypertrophy)   . CLL (chronic lymphocytic leukemia) [facility]) oncologist-  dr Heath Lark ([facility])   dx Jan 2014 --- Stage 1--  currently asymptomatic as of Apr 2016 and No active disease  . DDD (degenerative disc disease), cervical   . DDD (degenerative disc disease), lumbosacral   . Derangement of TMJ (temporomandibular joint) right side   post op radical neck dissection  07-21-2014--  misalignment right side mandible--  causes discomfort with chewy food like steak--  changed diet to accommendate  . Dysphasia functional--  speech therapy   post op  radical neck dissection 07-21-2014--  changed diet to no chewy food , softer foods , states with the changes swallowed okay without any issues  . Glaucoma    both eyes  . History of gastroesophageal reflux (GERD)   . History of radiation therapy    09-04-2014 to 10-13-2014  Right retromolar region (tumor bed) and right neck/  60Gy in 30 fractions to tumor bed and 54Gy in 30 fractions to neck  . History of radiation to head and neck region 09-04-2014 to  10-13-2014   right retromolar region and right neck  60 Gy  . History of tracheostomy    post op radical neck dissection  07-21-2014--  post op tracheostomy on 07-25-2014  decannulated and sutured 07-29-2014  . Hypertension   . Macular degeneration    right eye only  . MRSA (methicillin resistant staph aureus) culture positive   . Nephrolithiasis    right side--  per CT from [facility] 01-17-2015 non-obstructing  . OA (osteoarthritis)   . Organic impotence   . Pulmonary nodule    benign  and stable per last CT  . Radiation-induced dermatitis   . Squamous cell carcinoma of retromolar trigone [facility]) oncologist-  dr Isidore Moos   Stage IVB, pT1b, pN0, Grade 2, +PNI, no LVSI---  S/P  RADICAL NECK DISSECTION  AND RADIATION  . Thoracic ascending aortic aneurysm ([place]) 03/28/2016   Chest CTA 9/17: 4.6 cm fusiform ascending thoracic aortic aneurysm >> Plan repeat CTA in 3/18  . 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 Transitional cell carcinoma of bladder [facility]) urologist-- dr Junious Silk   High - grade  . Venous insufficiency      Social History   Social History  . Marital status: Single    Spouse name: N/A  . Number of children: 2  . Years of education: N/A   Occupational History  . Not on file.   Social History Main  Topics  . Smoking status: Never Smoker  . Smokeless tobacco: Never Used  . Alcohol use Yes     Comment: Gin and vodka 3-4 daily   (Hx alcohol withdrawal )  . Drug use: No  . Sexual activity: Not on file   Other Topics Concern  . Not on file   Social History Narrative   Patient is divorced with 2 children.   Patient has never smoked. Patient has never used smokeless tobacco.    Patient drinks gin or vodka on a daily basis.   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 CATARACT EXTRACTION W/ INTRAOCULAR LENS  IMPLANT, BILATERAL    . CYSTOSCOPY N/A 02/02/2015   Procedure: CYSTOSCOPY, INSTILLATION OF MITOMYCIN C;  Surgeon: Lowella Bandy, MD;  Location: [facility];  Service: Urology;  Laterality: N/A;  . CYSTOSCOPY WITH BIOPSY N/A 03/27/2015   Procedure: CYSTOSCOPY WITH BLADDER  BIOPSY WITH FULGERATION;  Surgeon: Festus Aloe, MD;  Location: [facility];  Service: Urology;  Laterality: N/A;  . INGUINAL HERNIA REPAIR Bilateral right 1988//  left 1970's  . LAMINECTOMY WITH POSTERIOR LATERAL ARTHRODESIS LEVEL 1  09-01-2003   L3 -5 LAMINECTOMY W/ DECOMPRESSION AND FUSION L4-5  . MAXILLECTOMY Right 07/21/14,   [facility]   Radical Neck Dissection, Maxillectomy, Right Marginal Mandibulectomy, dental extractions, Parascauplar fasciocutaeous Free flap reconstruction  . RETINAL DETACHMENT REPAIR W/ SCLERAL BUCKLE LE  11-03-2000  . ROTATOR CUFF REPAIR Right 12-11-2000  . tonsil biopsy Right 05/24/14  . TRANSURETHRAL RESECTION OF BLADDER TUMOR N/A 02/02/2015   Procedure: TRANSURETHRAL RESECTION OF BLADDER TUMOR (TURBT);  Surgeon: Lowella Bandy, MD;  Location: [facility];  Service: Urology;  Laterality: N/A;    Family History  Problem Relation Age of Onset  . Cancer Mother     lung ca  . Alcohol abuse Sister   .  Hypertension Neg Hx     family hx  . Sudden death Neg Hx     famiylhx  . Heart attack Neg Hx     No Known Allergies  Current Outpatient Prescriptions on File Prior to Visit  Medication Sig Dispense Refill  . brimonidine-timolol (COMBIGAN) 0.2-0.5 % ophthalmic solution Place 1 drop into both eyes 2 (two) times daily.     Marland Kitchen DHEA 50 MG TABS Take 1 tablet by mouth daily.    . feeding supplement, ENSURE ENLIVE, (ENSURE ENLIVE) LIQD Take 237 mLs by mouth 2 (two) times daily between meals. 237 mL 12  . fluticasone (FLONASE) 50 MCG/ACT nasal spray Place 2 sprays into both nostrils daily.  4  . furosemide (LASIX) 20 MG tablet Take 1 tablet (  20 mg total) by mouth every other day. 30 tablet 11  . Krill Oil 300 MG CAPS Take 300 mg by mouth daily.    Marland Kitchen latanoprost (XALATAN) 0.005 % ophthalmic solution Place 1 drop into both eyes every evening.   4  . Lutein-Zeaxanthin 15-0.7 MG CAPS Take 1 tablet by mouth daily.    . Melatonin 10 MG TABS Take 10 mg by mouth at bedtime as needed (for sleep).    . metoprolol succinate (TOPROL XL) 25 MG 24 hr tablet Take 1 tablet (25 mg total) by mouth daily. 30 tablet 6  . potassium chloride SA (K-DUR,KLOR-CON) 20 MEQ tablet Take 1 tablet (20 mEq total) by mouth every other day. 90 tablet 3  . sodium chloride (OCEAN) 0.65 % nasal spray Place 2 sprays into the nose as needed for congestion.     . sodium fluoride (DENTA 5000 PLUS) 1.1 % CREA dental cream Apply a thin ribbon of gel to tooth brush. Brush teeth for 2 minutes. Spit out excess. Repeat nightly. (Patient taking differently: Place 1 application onto teeth at bedtime. Apply a thin ribbon of gel to tooth brush. Brush teeth for 2 minutes. Spit out excess. Repeat nightly.) 51 g 0  . traMADol (ULTRAM) 50 MG tablet Take 1 tablet (50 mg total) by mouth every 6 (six) hours as needed for moderate pain. 30 tablet 0  . triamcinolone cream (KENALOG) 0.1 % Apply 1 application topically 2 (two) times daily.    Marland Kitchen zolpidem  (AMBIEN) 5 MG tablet TAKE 1 TABLET BY MOUTH AT BEDTIME AS NEEDED FOR SLEEP 30 tablet 2   No current facility-administered medications on file prior to visit.     There were no vitals taken for this visit.    Review of Systems  Constitutional: Positive for fatigue. Negative for appetite change, chills and fever.  HENT: Negative for congestion, dental problem, ear pain, hearing loss, sore throat, tinnitus, trouble swallowing and voice change.   Eyes: Negative for pain, discharge and visual disturbance.  Respiratory: Negative for cough, chest tightness, wheezing and stridor.   Cardiovascular: Positive for leg swelling. Negative for chest pain and palpitations.  Gastrointestinal: Negative for abdominal distention, abdominal pain, blood in stool, constipation, diarrhea, nausea and vomiting.  Genitourinary: Negative for difficulty urinating, discharge, flank pain, genital sores, hematuria and urgency.  Musculoskeletal: Positive for back pain. Negative for arthralgias, gait problem, joint swelling, myalgias and neck stiffness.  Skin: Negative for rash.  Neurological: Positive for weakness. Negative for dizziness, syncope, speech difficulty, numbness and headaches.  Hematological: Negative for adenopathy. Does not bruise/bleed easily.  Psychiatric/Behavioral: Negative for behavioral problems and dysphoric mood. The patient is not nervous/anxious.        Objective:   Physical Exam  Constitutional: He is oriented to person, place, and time. He appears well-developed.  HENT:  Head: Normocephalic.  Right Ear: External ear normal.  Left Ear: External ear normal.  Mouth/Throat: Oropharynx is clear and moist.  Eyes: Conjunctivae and EOM are normal.  Neck: Normal range of motion.  Cardiovascular: Normal rate, regular rhythm and normal heart sounds.   Rate 70  Pulmonary/Chest: Breath sounds normal. No respiratory distress. He has no rales.  Abdominal: Bowel sounds are normal.  Musculoskeletal:  Normal range of motion. He exhibits edema. He exhibits no tenderness.  Prominent lower extremity edema distal to the knees  Neurological: He is alert and oriented to person, place, and time.  Psychiatric: He has a normal mood and affect. His behavior is normal.  Assessment & Plan:   History of syncope.  No reoccurrence History sinus bradycardia, stable Lower extremity edema.  Will check electrolytes today.  Will increase furosemide to a twice a day regimen for 5 days then decrease to one daily.  Low-salt diet, elevation, compression hose.  All discussed and encouraged  Recheck 4 weeks Chronic sinusitis.  Follow-up ENT  [name]

## 2016-04-17 ENCOUNTER — Ambulatory Visit: Payer: Medicare Other | Attending: Radiation Oncology | Admitting: Physical Therapy

## 2016-04-17 ENCOUNTER — Encounter: Payer: Self-pay | Admitting: Physical Therapy

## 2016-04-17 DIAGNOSIS — R293 Abnormal posture: Secondary | ICD-10-CM | POA: Diagnosis not present

## 2016-04-17 DIAGNOSIS — M25611 Stiffness of right shoulder, not elsewhere classified: Secondary | ICD-10-CM | POA: Diagnosis not present

## 2016-04-17 DIAGNOSIS — M6281 Muscle weakness (generalized): Secondary | ICD-10-CM

## 2016-04-17 DIAGNOSIS — I890 Lymphedema, not elsewhere classified: Secondary | ICD-10-CM

## 2016-04-17 NOTE — Therapy (Signed)
[place], [place], [address] Phone: [phone]   Fax:  [phone]  Physical Therapy Evaluation  Patient Details  Name: Timothy Kim MRN: [id number] Date of Birth: 10-13-1929 Referring Provider: Dr. Isidore Moos  Encounter Date: 04/17/2016      PT End of Session - 04/17/16 1201    Visit Number 1   Number of Visits 9   Date for PT Re-Evaluation 05/15/16   PT Start Time 1020   PT Stop Time 1102   PT Time Calculation (min) 42 min   Activity Tolerance Patient tolerated treatment well   Behavior During Therapy [facility] for tasks assessed/performed      Past Medical History:  Diagnosis Date  . Alcohol abuse   . Allergic rhinitis   . Anemia in neoplastic disease    chronic  . Bilateral lower extremity edema    feet  . Borderline diabetes   . BPH (benign prostatic hypertrophy)   . CLL (chronic lymphocytic leukemia) [facility]) oncologist-  dr Heath Lark ([facility])   dx Jan 2014 --- Stage 1--  currently asymptomatic as of Apr 2016 and No active disease  . DDD (degenerative disc disease), cervical   . DDD (degenerative disc disease), lumbosacral   . 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 Dysphasia functional--  speech therapy   post op  radical neck dissection 07-21-2014--  changed diet to no chewy food , softer foods , states with the changes swallowed okay without any issues  . Glaucoma    both eyes  . History of gastroesophageal reflux (GERD)   . History of radiation therapy    09-04-2014 to 10-13-2014  Right retromolar region (tumor bed) and right neck/  60Gy in 30 fractions to tumor bed and 54Gy in 30 fractions to neck  . History of radiation to head and neck region 09-04-2014 to  10-13-2014   right retromolar region  and right neck  60 Gy  . History of tracheostomy    post op radical neck dissection  07-21-2014--  post op tracheostomy on 07-25-2014  decannulated and sutured 07-29-2014  . Hypertension   . Macular degeneration    right eye only  . MRSA (methicillin resistant staph aureus) culture positive   . Nephrolithiasis    right side--  per CT from [facility] 01-17-2015 non-obstructing  . OA (osteoarthritis)   . Organic impotence   . Pulmonary nodule    benign  and stable per last CT  . Radiation-induced dermatitis   . Squamous cell carcinoma of retromolar trigone [facility]) oncologist-  dr Isidore Moos   Stage IVB, pT1b, pN0, Grade 2, +PNI, no LVSI---  S/P  RADICAL NECK DISSECTION AND RADIATION  . Thoracic ascending aortic aneurysm ([place]) 03/28/2016   Chest CTA 9/17: 4.6 cm fusiform ascending thoracic aortic aneurysm >> Plan repeat CTA in 3/18  . 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 Transitional cell carcinoma of bladder [facility]) urologist-- dr Junious Silk   High - grade  . Venous insufficiency     Past Surgical History:  Procedure Laterality Date  . CATARACT EXTRACTION W/ INTRAOCULAR LENS  IMPLANT, BILATERAL    . CYSTOSCOPY N/A 02/02/2015   Procedure: CYSTOSCOPY, INSTILLATION OF MITOMYCIN C;  Surgeon: Lowella Bandy, MD;  Location:  [place];  Service: Urology;  Laterality: N/A;  . CYSTOSCOPY WITH BIOPSY N/A 03/27/2015   Procedure: CYSTOSCOPY WITH BLADDER  BIOPSY WITH FULGERATION;  Surgeon: Festus Aloe, MD;  Location: [facility];  Service: Urology;  Laterality: N/A;  . INGUINAL HERNIA REPAIR Bilateral right 1988//  left 1970's  . LAMINECTOMY WITH POSTERIOR LATERAL ARTHRODESIS LEVEL 1  09-01-2003   L3 -5 LAMINECTOMY W/ DECOMPRESSION AND FUSION L4-5  . MAXILLECTOMY Right 07/21/14,   [facility]   Radical Neck Dissection, Maxillectomy, Right Marginal Mandibulectomy, dental extractions, Parascauplar fasciocutaeous Free flap  reconstruction  . RETINAL DETACHMENT REPAIR W/ SCLERAL BUCKLE LE  11-03-2000  . ROTATOR CUFF REPAIR Right 12-11-2000  . tonsil biopsy Right 05/24/14  . TRANSURETHRAL RESECTION OF BLADDER TUMOR N/A 02/02/2015   Procedure: TRANSURETHRAL RESECTION OF BLADDER TUMOR (TURBT);  Surgeon: Lowella Bandy, MD;  Location: [facility];  Service: Urology;  Laterality: N/A;    There were no vitals filed for this visit.       Subjective Assessment - 04/17/16 1030    Subjective Patient reports he has not been wearing his head and neck garment for management of edema. He says he went to bed with it on and woke up almost screaming because he thought someone was strangling him and he has not put it back on since. Patient has not tried to wear it at all during the day. He states he should have but he just hasn't done it. Patient has states he has degenerative spinal stenosis and has difficulty standing up straight. He feels that this makes it harder for him to stand up straight so he uses a cane.    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 Swelling has been at the operation and has not gone down  On 07/22/2015 at [facility] he had maxillectomy with r rim mandibulectomy, dental extractions, right parascapular fasciocutaneous free flap reconstruction  due to right tonsillar cancer.  He followed up with radiation  competed 10/13/2014.   Patient Stated Goals to get this swelling down   Currently in Pain? 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 Isidore Moos   Onset Date/Surgical Date 06/06/14   Prior Therapy lymphedema therapy in Feb 2017 at this clinic     Precautions   Precautions Other (comment)   Precaution Comments cancer precautions, lymphedema     Restrictions   Weight  Bearing Restrictions No     Balance Screen   Has the patient fallen in the past 6 months Yes  patient reports he fainted 2 and fell   How many times? 2   Has the patient had a decrease in activity level because of a fear of falling?  No   Is the patient reluctant to leave their home because of a fear of falling?  No     Home Environment   Living Environment Private residence   Living Arrangements Spouse/significant other   Available Help at Discharge Family   Type of [place] to enter   Entrance Stairs-Number of Steps 1   [place] One level     Prior Function   Level of Independence Independent with basic ADLs;Independent  with gait   Leisure pt states he walks as much as he can at home but states unfortunately he sits a lot     Cognition   Overall Cognitive Status Within Functional Limits for tasks assessed     Observation/Other Assessments   Observations pt has visible swelling in right cheek and chin with fullness in left lip.  He has assymetrical movment at lips he has indentation and contraction of skin at right lateral neck and fullness below chin on left side of neck.  pt also has edema in both lower legs and reported he is wearing compression stockings occasionally but he has not done that in a few days. He was able to wear normal shoes  LE edema will not be addressed this visit    Skin Integrity no open areas observed.    Other Surveys  --  LLIS: 37% impairment     Posture/Postural Control   Posture/Postural Control Postural limitations   Postural Limitations Rounded Shoulders;Forward head;Increased thoracic kyphosis   Posture Comments --     AROM   Right Shoulder Flexion 80 Degrees   Right Shoulder ABduction 84 Degrees   Left Shoulder Flexion 131 Degrees   Left Shoulder ABduction 151 Degrees     Strength   Right Hip Flexion 3-/5   Right Hip ABduction 5/5   Left Hip Flexion 3+/5   Left Hip ABduction 5/5     Palpation   Palpation  comment tightness from radiation fibrosis at right lateral neck with fullness in cheek and right chin      Ambulation/Gait   Ambulation/Gait Assistance 6: Modified independent (Device/Increase time)  pt using single point cane for ambulation           LYMPHEDEMA/ONCOLOGY QUESTIONNAIRE - 04/17/16 1040      Type   Cancer Type squamous cell of tonsil     Surgeries   Other Surgery Date 06/06/14     Treatment   Active Chemotherapy Treatment No   Past Chemotherapy Treatment No   Active Radiation Treatment No   Past Radiation Treatment Yes   Current Hormone Treatment No   Past Hormone Therapy No     What other symptoms do you have   Are you Having Heaviness or Tightness Yes   Are you having Pain No   Are you having pitting edema No     Lymphedema Assessments   Lymphedema Assessments Head and Neck     Head and Neck   Right Corner of mouth to where ear lobe meets face 11 cm   Left Corner of mouth to where ear lobe meets face 11 cm   4 cm superior to sternal notch around neck 40 cm   6 cm superior to sternal notch around neck 41 cm   8 cm superior to sternal notch around neck 44 cm   Other pt has asymmetry of neck from jaw resection and radiation                        PT Education - 04/17/16 1202    Education provided Yes   Education Details importance of wearing compression garments for management of edema   Person(s) Educated Patient   Methods Explanation   Comprehension Verbalized understanding                [facility] Goals - 04/17/16 1209      CC Long Term Goal  #1   Title Patient will be  independent in a home exercise program for strengthening and stretching   Time 4   Period Weeks   Status New     CC Long Term Goal  #2   Title Patient will demonstrate 3+/5 strength in right hip flexor to allow him to don his pants with less difficulty   Baseline 3-/5   Time 4   Period Weeks   Status New     CC Long Term Goal  #3    Title Patient will be able to independently manage his edema through use of appropriate compression garments and self MLD   Time 4   Period Weeks   Status New     CC Long Term Goal  #4   Title Decrease LLIS score to less than or equal to 20% impairment for improved function   Baseline 37% impairment   Time 4   Period Weeks   Status New     CC Long Term Goal  #5   Title Patient to demonstrate a 1 cm decrease in edema 8 cm superior to sternal notch around neck    Baseline 44   Time 4   Period Weeks   Status New            Plan - 04/17/16 1203    Clinical Impression Statement Patient presents to PT today for continued facial and neck lymphedema. Patient states he has not been wearing his compression garment because he woke up in the middle of the night and felt like it was strangling him. Upon assessment it was noted that patient has hip flexion weakness with R greater than left and he reports difficulty dressing himself due to this. He has decreased shoulder ROM bilaterally with R worse than L and reports this is related to rotator cuff dysfunction that has been long standing. Patient ambulates with a cane at all times. He has degenerative disk disease and states it is hard for him to stand up straight and he uses the cane to help with this. Paitent would beneift from skilled PT services for strengthening of hip flexors, ROM of bilateral shoulders, exercises to help improve standing posture and for management of fascial and neck edema.    Rehab Potential Good   Clinical Impairments Affecting Rehab Potential Sugery and radiation to neck    PT Frequency 2x / week   PT Duration 4 weeks   PT Treatment/Interventions Manual lymph drainage;Taping;Manual techniques;Therapeutic exercise;ADLs/Self Care Home Management;Patient/family education;Vasopneumatic Device;Passive range of motion;Scar mobilization;Therapeutic activities   PT Next Visit Plan begin supine decompression exercises for back,  gentle PROM that is pain free to R shoulder, hip strengthening exercises, MLD to face and neck   Consulted and Agree with Plan of Care Patient      Patient will benefit from skilled therapeutic intervention in order to improve the following deficits and impairments:  Increased fascial restricitons, Postural dysfunction, Decreased range of motion, Decreased strength, Impaired UE functional use, Increased edema  Visit Diagnosis: Lymphedema, not elsewhere classified - Plan: PT plan of care cert/re-cert  Muscle weakness (generalized) - Plan: PT plan of care cert/re-cert  Stiffness of right shoulder, not elsewhere classified - Plan: PT plan of care cert/re-cert  Abnormal posture - Plan: PT plan of care cert/re-cert      G-Codes - [id number]    Functional Assessment Tool Used LLIS   Functional Limitation Other PT primary   Other PT Primary Current Status [id number]) At least 20 percent but less than 40 percent impaired, limited  or restricted   Other PT Primary Goal Status [id number]) At least 1 percent but less than 20 percent impaired, limited or restricted       Problem List Patient Active Problem List   Diagnosis Date Noted  . Bilateral leg edema 03/28/2016  . Thoracic ascending aortic aneurysm ([place]) 03/28/2016  . Syncope 03/19/2016  . Weakness generalized 03/19/2016  . Macrocytic anemia 03/19/2016  . Pneumonia 03/18/2016  . Drug-induced hypokalemia 10/16/2015  . History of bladder cancer 02/08/2015  . Anemia in neoplastic disease 10/17/2014  . 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 Squamous cell carcinoma of retromolar trigone ([place]) 06/07/2014  . Abnormal weight loss 05/31/2014  . Hemoptysis 05/31/2014  . Cancer of the lip, oral cavity, and pharynx ([place]) 05/30/2014  . CLL (chronic lymphocytic leukemia) ([place]) 07/07/2012  . ALLERGIC RHINITIS 02/09/2008  . 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 OSTEOARTHRITIS 06/09/2007  . LOW BACK PAIN 06/09/2007  . Diabetes mellitus without complication  ([place]) 0000000  . Essential hypertension 03/03/2007  . GERD 03/03/2007  . BENIGN PROSTATIC HYPERTROPHY 03/03/2007    Alexia Freestone 04/17/2016, 12:23 PM  [place], [place], [address] Phone: [phone]   Fax:  [phone]  Name: JENTEZEN ARD MRN: [id number] Date of Birth: [birth year]  Allyson Sabal, PT 04/17/16 12:24 PM

## 2016-04-22 DIAGNOSIS — J329 Chronic sinusitis, unspecified: Secondary | ICD-10-CM | POA: Diagnosis not present

## 2016-04-29 ENCOUNTER — Ambulatory Visit: Payer: Medicare Other

## 2016-05-01 ENCOUNTER — Ambulatory Visit: Payer: Medicare Other | Attending: Radiation Oncology | Admitting: Physical Therapy

## 2016-05-01 ENCOUNTER — Encounter: Payer: Self-pay | Admitting: Physical Therapy

## 2016-05-01 DIAGNOSIS — M25611 Stiffness of right shoulder, not elsewhere classified: Secondary | ICD-10-CM | POA: Diagnosis not present

## 2016-05-01 DIAGNOSIS — M6281 Muscle weakness (generalized): Secondary | ICD-10-CM | POA: Diagnosis not present

## 2016-05-01 DIAGNOSIS — I890 Lymphedema, not elsewhere classified: Secondary | ICD-10-CM | POA: Diagnosis not present

## 2016-05-01 DIAGNOSIS — R293 Abnormal posture: Secondary | ICD-10-CM | POA: Insufficient documentation

## 2016-05-01 NOTE — Therapy (Signed)
[place], [place], [address] Phone: [phone]   Fax:  [phone]  Physical Therapy Treatment  Patient Details  Name: Timothy Kim MRN: [id number] Date of Birth: 11/16/1929 Referring Provider: Dr. Isidore Moos  Encounter Date: 05/01/2016      PT End of Session - 05/01/16 1156    Visit Number 2   Number of Visits 9   Date for PT Re-Evaluation 05/15/16   PT Start Time 1102   PT Stop Time 1159   PT Time Calculation (min) 57 min   Activity Tolerance Patient tolerated treatment well   Behavior During Therapy [facility] for tasks assessed/performed      Past Medical History:  Diagnosis Date  . Alcohol abuse   . Allergic rhinitis   . Anemia in neoplastic disease    chronic  . Bilateral lower extremity edema    feet  . Borderline diabetes   . BPH (benign prostatic hypertrophy)   . CLL (chronic lymphocytic leukemia) [facility]) oncologist-  dr Heath Lark ([facility])   dx Jan 2014 --- Stage 1--  currently asymptomatic as of Apr 2016 and No active disease  . DDD (degenerative disc disease), cervical   . DDD (degenerative disc disease), lumbosacral   . 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 Dysphasia functional--  speech therapy   post op  radical neck dissection 07-21-2014--  changed diet to no chewy food , softer foods , states with the changes swallowed okay without any issues  . Glaucoma    both eyes  . History of gastroesophageal reflux (GERD)   . History of radiation therapy    09-04-2014 to 10-13-2014  Right retromolar region (tumor bed) and right neck/  60Gy in 30 fractions to tumor bed and 54Gy in 30 fractions to neck  . History of radiation to head and neck region 09-04-2014 to  10-13-2014   right retromolar region  and right neck  60 Gy  . History of tracheostomy    post op radical neck dissection  07-21-2014--  post op tracheostomy on 07-25-2014  decannulated and sutured 07-29-2014  . Hypertension   . Macular degeneration    right eye only  . MRSA (methicillin resistant staph aureus) culture positive   . Nephrolithiasis    right side--  per CT from [facility] 01-17-2015 non-obstructing  . OA (osteoarthritis)   . Organic impotence   . Pulmonary nodule    benign  and stable per last CT  . Radiation-induced dermatitis   . Squamous cell carcinoma of retromolar trigone [facility]) oncologist-  dr Isidore Moos   Stage IVB, pT1b, pN0, Grade 2, +PNI, no LVSI---  S/P  RADICAL NECK DISSECTION AND RADIATION  . Thoracic ascending aortic aneurysm ([place]) 03/28/2016   Chest CTA 9/17: 4.6 cm fusiform ascending thoracic aortic aneurysm >> Plan repeat CTA in 3/18  . 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 Transitional cell carcinoma of bladder [facility]) urologist-- dr Junious Silk   High - grade  . Venous insufficiency     Past Surgical History:  Procedure Laterality Date  . CATARACT EXTRACTION W/ INTRAOCULAR LENS  IMPLANT, BILATERAL    . CYSTOSCOPY N/A 02/02/2015   Procedure: CYSTOSCOPY, INSTILLATION OF MITOMYCIN C;  Surgeon: Lowella Bandy, MD;  Location:  [place];  Service: Urology;  Laterality: N/A;  . CYSTOSCOPY WITH BIOPSY N/A 03/27/2015   Procedure: CYSTOSCOPY WITH BLADDER  BIOPSY WITH FULGERATION;  Surgeon: Festus Aloe, MD;  Location: [facility];  Service: Urology;  Laterality: N/A;  . INGUINAL HERNIA REPAIR Bilateral right 1988//  left 1970's  . LAMINECTOMY WITH POSTERIOR LATERAL ARTHRODESIS LEVEL 1  09-01-2003   L3 -5 LAMINECTOMY W/ DECOMPRESSION AND FUSION L4-5  . MAXILLECTOMY Right 07/21/14,   [facility]   Radical Neck Dissection, Maxillectomy, Right Marginal Mandibulectomy, dental extractions, Parascauplar fasciocutaeous Free flap  reconstruction  . RETINAL DETACHMENT REPAIR W/ SCLERAL BUCKLE LE  11-03-2000  . ROTATOR CUFF REPAIR Right 12-11-2000  . tonsil biopsy Right 05/24/14  . TRANSURETHRAL RESECTION OF BLADDER TUMOR N/A 02/02/2015   Procedure: TRANSURETHRAL RESECTION OF BLADDER TUMOR (TURBT);  Surgeon: Lowella Bandy, MD;  Location: [facility];  Service: Urology;  Laterality: N/A;    There were no vitals filed for this visit.      Subjective Assessment - 05/01/16 1116    Subjective Patient reports he knows we need to address the face swelling but his biggest complaint is weakness and also some leg swelling.   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 Swelling has been at the operation and has not gone down  On 07/22/2015 at [facility] he had maxillectomy with r rim mandibulectomy, dental extractions, right parascapular fasciocutaneous free flap reconstruction  due to right tonsillar cancer.  He followed up with radiation  competed 10/13/2014.   Patient Stated Goals to get this swelling down   Currently in Pain? No/denies                         [facility] Adult PT Treatment/Exercise - 05/01/16 0001      Exercises   Exercises Shoulder;Lumbar;Knee/Hip     Lumbar Exercises: Aerobic   Stationary Bike Nustep x5 minutes at level 3     Lumbar Exercises: Supine   Bridge 10 reps;3 seconds     Knee/Hip Exercises: Supine   Short Arc Quad Sets AROM;Both;10 reps;1 set   Hip Adduction Isometric Strengthening;Both;10 reps  Squeezing pillow with 3 second holds in hooklying   Straight Leg Raises AROM;Both;5 reps  With manual assistance     Shoulder Exercises: Supine   Other Supine Exercises Scapular retraction x10 supine with verbal cues for technique                PT Education - 05/01/16 1155    Education provided Yes   Education Details HEP for lower body strengthening   Person(s) Educated  Patient;Spouse   Methods Explanation;Demonstration;Handout   Comprehension Verbalized understanding;Returned demonstration                [facility] Goals - 04/17/16 1209      CC Long Term Goal  #1   Title Patient will be independent in a home exercise program for strengthening and stretching   Time 4   Period Weeks   Status New     CC Long Term Goal  #2   Title Patient will demonstrate 3+/5 strength in right hip flexor to allow him to don his pants with less difficulty   Baseline 3-/5   Time 4   Period Weeks   Status New     CC Long Term Goal  #3   Title Patient  will be able to independently manage his edema through use of appropriate compression garments and self MLD   Time 4   Period Weeks   Status New     CC Long Term Goal  #4   Title Decrease LLIS score to less than or equal to 20% impairment for improved function   Baseline 37% impairment   Time 4   Period Weeks   Status New     CC Long Term Goal  #5   Title Patient to demonstrate a 1 cm decrease in edema 8 cm superior to sternal notch around neck    Baseline 44   Time 4   Period Weeks   Status New            Plan - 05/01/16 1157    Clinical Impression Statement Patient is very weak and fatigued with ambulation.  He did very well today focusing on exercise and was pleased to do see that he tolerated that.  We discussed many aspects of getting him more functional including wearing stockings for leg swelling, getting a tub chair to shower independently, doing leg exercises (given today), and getting up every hour to walk around a few minutes.  These tasks should improve with strengthening.     Rehab Potential Good   Clinical Impairments Affecting Rehab Potential Sugery and radiation to neck    PT Frequency 2x / week   PT Duration 4 weeks   PT Treatment/Interventions Manual lymph drainage;Taping;Manual techniques;Therapeutic exercise;ADLs/Self Care Home Management;Patient/family  education;Vasopneumatic Device;Passive range of motion;Scar mobilization;Therapeutic activities   PT Next Visit Plan Continue with focus on strengthening.  NuStep at beginning or end of treatment.  If order for tub chair is back from Dr. Isidore Moos, give to patient to take to [place] body strengthening   Consulted and Agree with Plan of Care Patient;Family member/caregiver   Family Member Consulted Partner Belenda Cruise)      Patient will benefit from skilled therapeutic intervention in order to improve the following deficits and impairments:  Increased fascial restricitons, Postural dysfunction, Decreased range of motion, Decreased strength, Impaired UE functional use, Increased edema  Visit Diagnosis: Muscle weakness (generalized)  Stiffness of right shoulder, not elsewhere classified  Abnormal posture  Lymphedema, not elsewhere classified     Problem List Patient Active Problem List   Diagnosis Date Noted  . Bilateral leg edema 03/28/2016  . Thoracic ascending aortic aneurysm ([place]) 03/28/2016  . Syncope 03/19/2016  . Weakness generalized 03/19/2016  . Macrocytic anemia 03/19/2016  . Pneumonia 03/18/2016  . Drug-induced hypokalemia 10/16/2015  . History of bladder cancer 02/08/2015  . Anemia in neoplastic disease 10/17/2014  . 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 Squamous cell carcinoma of retromolar trigone ([place]) 06/07/2014  . Abnormal weight loss 05/31/2014  . Hemoptysis 05/31/2014  . Cancer of the lip, oral cavity, and pharynx ([place]) 05/30/2014  . CLL (chronic lymphocytic leukemia) ([place]) 07/07/2012  . ALLERGIC RHINITIS 02/09/2008  . 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 OSTEOARTHRITIS 06/09/2007  . LOW BACK PAIN 06/09/2007  . Diabetes mellitus without complication ([place]) 0000000  . Essential hypertension 03/03/2007  . GERD 03/03/2007  . BENIGN PROSTATIC HYPERTROPHY 03/03/2007   Annia Friendly, PT 05/01/16 12:21 PM  [place], [place], [address] Phone: [phone]   Fax:  [phone]  Name: Timothy Kim MRN: [id number] Date of Birth: 02-18-1930

## 2016-05-01 NOTE — Patient Instructions (Signed)
Bridging    Slowly raise buttocks from floor, keeping stomach tight. Repeat _10___ times per set. Do __1__ sets per session. Do _2___ sessions per day.  http://orth.exer.us/1096   Copyright  VHI. All rights reserved.  Knee Extension: Short Arc (Eccentric) - Supine or Sitting    Lie on back with roll under knee. Extend knee. Slowly lower foot for 3-5 seconds. __10_ reps per set, __2_ sets per day, _7__ days per week.  http://ecce.exer.us/142   Copyright  VHI. All rights reserved.  HIP: Flexion / KNEE: Extension, Straight Leg Raise (Weight)    Place weight around leg. Raise leg up, keeping knee straight. Perform slowly. Hold _2__ seconds.  __10_ reps per set, _2__ sets per day, __7_ days per week   Copyright  VHI. All rights reserved.  Hip (Front)    Begin sitting tall, both feet flat on floor. Inhale, then exhale while lifting knee as high as is comfortable, keeping upper body straight and still. Slowly return to starting position. Repeat __10__ times each leg. Do __2__ sets per session. Do __2__ sessions per day.  Copyright  VHI. All rights reserved.

## 2016-05-06 ENCOUNTER — Ambulatory Visit: Payer: Medicare Other

## 2016-05-06 DIAGNOSIS — I890 Lymphedema, not elsewhere classified: Secondary | ICD-10-CM | POA: Diagnosis not present

## 2016-05-06 DIAGNOSIS — M25611 Stiffness of right shoulder, not elsewhere classified: Secondary | ICD-10-CM | POA: Diagnosis not present

## 2016-05-06 DIAGNOSIS — M6281 Muscle weakness (generalized): Secondary | ICD-10-CM

## 2016-05-06 DIAGNOSIS — R293 Abnormal posture: Secondary | ICD-10-CM

## 2016-05-06 NOTE — Therapy (Signed)
[place], [place], [address] Phone: [phone]   Fax:  [phone]  Physical Therapy Treatment  Patient Details  Name: Timothy Kim MRN: [id number] Date of Birth: [birth year] Referring Provider: Dr. Isidore Moos  Encounter Date: 05/06/2016      PT End of Session - 05/06/16 1146    Visit Number 3   Number of Visits 9   Date for PT Re-Evaluation 05/15/16   PT Start Time 1107   PT Stop Time 1150   PT Time Calculation (min) 43 min   Activity Tolerance Patient tolerated treatment well   Behavior During Therapy [facility] for tasks assessed/performed      Past Medical History:  Diagnosis Date  . Alcohol abuse   . Allergic rhinitis   . Anemia in neoplastic disease    chronic  . Bilateral lower extremity edema    feet  . Borderline diabetes   . BPH (benign prostatic hypertrophy)   . CLL (chronic lymphocytic leukemia) [facility]) oncologist-  dr Heath Lark ([facility])   dx Jan 2014 --- Stage 1--  currently asymptomatic as of Apr 2016 and No active disease  . DDD (degenerative disc disease), cervical   . DDD (degenerative disc disease), lumbosacral   . 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 Dysphasia functional--  speech therapy   post op  radical neck dissection 07-21-2014--  changed diet to no chewy food , softer foods , states with the changes swallowed okay without any issues  . Glaucoma    both eyes  . History of gastroesophageal reflux (GERD)   . History of radiation therapy    09-04-2014 to 10-13-2014  Right retromolar region (tumor bed) and right neck/  60Gy in 30 fractions to tumor bed and 54Gy in 30 fractions to neck  . History of radiation to head and neck region 09-04-2014 to  10-13-2014   right retromolar region  and right neck  60 Gy  . History of tracheostomy    post op radical neck dissection  07-21-2014--  post op tracheostomy on 07-25-2014  decannulated and sutured 07-29-2014  . Hypertension   . Macular degeneration    right eye only  . MRSA (methicillin resistant staph aureus) culture positive   . Nephrolithiasis    right side--  per CT from [facility] 01-17-2015 non-obstructing  . OA (osteoarthritis)   . Organic impotence   . Pulmonary nodule    benign  and stable per last CT  . Radiation-induced dermatitis   . Squamous cell carcinoma of retromolar trigone [facility]) oncologist-  dr Isidore Moos   Stage IVB, pT1b, pN0, Grade 2, +PNI, no LVSI---  S/P  RADICAL NECK DISSECTION AND RADIATION  . Thoracic ascending aortic aneurysm ([place]) 03/28/2016   Chest CTA 9/17: 4.6 cm fusiform ascending thoracic aortic aneurysm >> Plan repeat CTA in 3/18  . 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 Transitional cell carcinoma of bladder [facility]) urologist-- dr Junious Silk   High - grade  . Venous insufficiency     Past Surgical History:  Procedure Laterality Date  . CATARACT EXTRACTION W/ INTRAOCULAR LENS  IMPLANT, BILATERAL    . CYSTOSCOPY N/A 02/02/2015   Procedure: CYSTOSCOPY, INSTILLATION OF MITOMYCIN C;  Surgeon: Lowella Bandy, MD;  Location:  [place];  Service: Urology;  Laterality: N/A;  . CYSTOSCOPY WITH BIOPSY N/A 03/27/2015   Procedure: CYSTOSCOPY WITH BLADDER  BIOPSY WITH FULGERATION;  Surgeon: Festus Aloe, MD;  Location: [facility];  Service: Urology;  Laterality: N/A;  . INGUINAL HERNIA REPAIR Bilateral right 1988//  left 1970's  . LAMINECTOMY WITH POSTERIOR LATERAL ARTHRODESIS LEVEL 1  09-01-2003   L3 -5 LAMINECTOMY W/ DECOMPRESSION AND FUSION L4-5  . MAXILLECTOMY Right 07/21/14,   [facility]   Radical Neck Dissection, Maxillectomy, Right Marginal Mandibulectomy, dental extractions, Parascauplar fasciocutaeous Free flap  reconstruction  . RETINAL DETACHMENT REPAIR W/ SCLERAL BUCKLE LE  11-03-2000  . ROTATOR CUFF REPAIR Right 12-11-2000  . tonsil biopsy Right 05/24/14  . TRANSURETHRAL RESECTION OF BLADDER TUMOR N/A 02/02/2015   Procedure: TRANSURETHRAL RESECTION OF BLADDER TUMOR (TURBT);  Surgeon: Lowella Bandy, MD;  Location: [facility];  Service: Urology;  Laterality: N/A;    There were no vitals filed for this visit.      Subjective Assessment - 05/06/16 1116    Subjective Been feeling worse gradually since last visit, losing my appetite. Barely made it in here today. But felt noworse after last visit, liked all the exercises.    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 Swelling has been at the operation and has not gone down  On 07/22/2015 at [facility] he had maxillectomy with r rim mandibulectomy, dental extractions, right parascapular fasciocutaneous free flap reconstruction  due to right tonsillar cancer.  He followed up with radiation  competed 10/13/2014.   Patient Stated Goals to get this swelling down   Currently in Pain? No/denies                         [facility] Adult PT Treatment/Exercise - 05/06/16 0001      Lumbar Exercises: Aerobic   Stationary Bike --     Lumbar Exercises: Supine   Bridge 10 reps;3 seconds     Knee/Hip Exercises: Aerobic   Nustep Level 3 for 7 minutes with therapist present     Knee/Hip Exercises: Seated   Long Arc Quad AROM;Strengthening;Both;1 set;10 reps;2 sets  Did second set after supine exercises   Ball Squeeze 20 times with 3-5 second holds   Marching Limitations Marching with bil LE's 10 times each LE, 2 sets and second set after supine exercises.     Knee/Hip Exercises: Supine   Short Arc Quad Sets AROM;Both;10 reps;1 set   Straight Leg Raises AROM;Both;2 sets;5 reps  With manual resistance     Shoulder Exercises: Supine   External Rotation  Strengthening;Both;10 reps;Theraband  2 sets of 10 times   Theraband Level (Shoulder External Rotation) Level 2 (Red)   Other Supine Exercises Scapular retraction x10 supine with verbal cues for technique and therapist demonstration                        [facility] Goals - 04/17/16 1209      CC Long Term Goal  #1   Title Patient will be independent in a home exercise program for strengthening and stretching   Time 4   Period Weeks   Status New     CC Long Term Goal  #2   Title Patient will demonstrate 3+/5 strength in right hip flexor to allow him to don his pants with less difficulty  Baseline 3-/5   Time 4   Period Weeks   Status New     CC Long Term Goal  #3   Title Patient will be able to independently manage his edema through use of appropriate compression garments and self MLD   Time 4   Period Weeks   Status New     CC Long Term Goal  #4   Title Decrease LLIS score to less than or equal to 20% impairment for improved function   Baseline 37% impairment   Time 4   Period Weeks   Status New     CC Long Term Goal  #5   Title Patient to demonstrate a 1 cm decrease in edema 8 cm superior to sternal notch around neck    Baseline 44   Time 4   Period Weeks   Status New            Plan - 05/06/16 1146    Clinical Impression Statement Pt tolerated strengthening exercises very well today requiring only a few short rest breaks and he reports feeling good after session as well. Continued encouraging him throughout session today in regards of exercising as tolerated during his day and partner also present for this. Pt and wife were encouraged at end of session by all pt was able to tolerate today. Pt will continue to benefit from strengthening exercises to further promote his recovery from recent cancer treatment.    Rehab Potential Good   Clinical Impairments Affecting Rehab Potential Sugery and radiation to neck    PT Frequency 2x / week   PT  Duration 4 weeks   PT Treatment/Interventions Manual lymph drainage;Taping;Manual techniques;Therapeutic exercise;ADLs/Self Care Home Management;Patient/family education;Vasopneumatic Device;Passive range of motion;Scar mobilization;Therapeutic activities   PT Next Visit Plan Assess goals next visit. Continue with focus on strengthening and issue HEP for this.  NuStep at beginning or end of treatment.  If order for tub chair is back from Dr. Isidore Moos, give to patient to take to [place] and Agree with Plan of Care Patient;Family member/caregiver   Family Member Consulted Partner Belenda Cruise)      Patient will benefit from skilled therapeutic intervention in order to improve the following deficits and impairments:  Increased fascial restricitons, Postural dysfunction, Decreased range of motion, Decreased strength, Impaired UE functional use, Increased edema  Visit Diagnosis: Muscle weakness (generalized)  Stiffness of right shoulder, not elsewhere classified  Abnormal posture     Problem List Patient Active Problem List   Diagnosis Date Noted  . Bilateral leg edema 03/28/2016  . Thoracic ascending aortic aneurysm ([place]) 03/28/2016  . Syncope 03/19/2016  . Weakness generalized 03/19/2016  . Macrocytic anemia 03/19/2016  . Pneumonia 03/18/2016  . Drug-induced hypokalemia 10/16/2015  . History of bladder cancer 02/08/2015  . Anemia in neoplastic disease 10/17/2014  . 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 Squamous cell carcinoma of retromolar trigone ([place]) 06/07/2014  . Abnormal weight loss 05/31/2014  . Hemoptysis 05/31/2014  . Cancer of the lip, oral cavity, and pharynx ([place]) 05/30/2014  . CLL (chronic lymphocytic leukemia) ([place]) 07/07/2012  . ALLERGIC RHINITIS 02/09/2008  . 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 OSTEOARTHRITIS 06/09/2007  . LOW BACK PAIN 06/09/2007  . Diabetes mellitus without complication ([place]) 0000000  . Essential hypertension 03/03/2007  . GERD  03/03/2007  . BENIGN PROSTATIC HYPERTROPHY 03/03/2007    Otelia Limes, PTA 05/06/2016, 12:02 PM  [place], [place], [address] Phone:  [phone]   Fax:  [phone]  Name: Timothy Kim MRN: [id number] Date of Birth: Apr 06, 1930

## 2016-05-08 ENCOUNTER — Telehealth: Payer: Self-pay | Admitting: Physical Therapy

## 2016-05-08 ENCOUNTER — Ambulatory Visit: Payer: Medicare Other | Admitting: Physical Therapy

## 2016-05-08 DIAGNOSIS — I890 Lymphedema, not elsewhere classified: Secondary | ICD-10-CM | POA: Diagnosis not present

## 2016-05-08 DIAGNOSIS — M25611 Stiffness of right shoulder, not elsewhere classified: Secondary | ICD-10-CM

## 2016-05-08 DIAGNOSIS — M6281 Muscle weakness (generalized): Secondary | ICD-10-CM

## 2016-05-08 DIAGNOSIS — R293 Abnormal posture: Secondary | ICD-10-CM | POA: Diagnosis not present

## 2016-05-08 NOTE — Telephone Encounter (Signed)
Informed pt that his script for his tub chair is here and he can pick it up at his next visit.  Pt. Verbalized understanding.

## 2016-05-08 NOTE — Therapy (Signed)
[place], [place], [address] Phone: [phone]   Fax:  [phone]  Physical Therapy Treatment  Patient Details  Name: Timothy Kim MRN: [id number] Date of Birth: [birth year] Referring Provider: Dr. Isidore Moos  Encounter Date: 05/08/2016      PT End of Session - 05/08/16 1623    Visit Number 4   Number of Visits 9   Date for PT Re-Evaluation 05/15/16   PT Start Time [id number]  pt arrived late due to traffic jam    PT Stop Time 1430   PT Time Calculation (min) 28 min   Activity Tolerance Patient tolerated treatment well   Behavior During Therapy [facility] for tasks assessed/performed      Past Medical History:  Diagnosis Date  . Alcohol abuse   . Allergic rhinitis   . Anemia in neoplastic disease    chronic  . Bilateral lower extremity edema    feet  . Borderline diabetes   . BPH (benign prostatic hypertrophy)   . CLL (chronic lymphocytic leukemia) [facility]) oncologist-  dr Heath Lark ([facility])   dx Jan 2014 --- Stage 1--  currently asymptomatic as of Apr 2016 and No active disease  . DDD (degenerative disc disease), cervical   . DDD (degenerative disc disease), lumbosacral   . 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 Dysphasia functional--  speech therapy   post op  radical neck dissection 07-21-2014--  changed diet to no chewy food , softer foods , states with the changes swallowed okay without any issues  . Glaucoma    both eyes  . History of gastroesophageal reflux (GERD)   . History of radiation therapy    09-04-2014 to 10-13-2014  Right retromolar region (tumor bed) and right neck/  60Gy in 30 fractions to tumor bed and 54Gy in 30 fractions to neck  . History of radiation to head and neck region 09-04-2014 to   10-13-2014   right retromolar region and right neck  60 Gy  . History of tracheostomy    post op radical neck dissection  07-21-2014--  post op tracheostomy on 07-25-2014  decannulated and sutured 07-29-2014  . Hypertension   . Macular degeneration    right eye only  . MRSA (methicillin resistant staph aureus) culture positive   . Nephrolithiasis    right side--  per CT from [facility] 01-17-2015 non-obstructing  . OA (osteoarthritis)   . Organic impotence   . Pulmonary nodule    benign  and stable per last CT  . Radiation-induced dermatitis   . Squamous cell carcinoma of retromolar trigone [facility]) oncologist-  dr Isidore Moos   Stage IVB, pT1b, pN0, Grade 2, +PNI, no LVSI---  S/P  RADICAL NECK DISSECTION AND RADIATION  . Thoracic ascending aortic aneurysm ([place]) 03/28/2016   Chest CTA 9/17: 4.6 cm fusiform ascending thoracic aortic aneurysm >> Plan repeat CTA in 3/18  . 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 Transitional cell carcinoma of bladder [facility]) urologist-- dr Junious Silk   High - grade  . Venous insufficiency     Past Surgical History:  Procedure Laterality Date  . CATARACT EXTRACTION W/ INTRAOCULAR LENS  IMPLANT, BILATERAL    . CYSTOSCOPY N/A 02/02/2015   Procedure: CYSTOSCOPY, INSTILLATION OF  MITOMYCIN C;  Surgeon: Lowella Bandy, MD;  Location: [facility];  Service: Urology;  Laterality: N/A;  . CYSTOSCOPY WITH BIOPSY N/A 03/27/2015   Procedure: CYSTOSCOPY WITH BLADDER  BIOPSY WITH FULGERATION;  Surgeon: Festus Aloe, MD;  Location: [facility];  Service: Urology;  Laterality: N/A;  . INGUINAL HERNIA REPAIR Bilateral right 1988//  left 1970's  . LAMINECTOMY WITH POSTERIOR LATERAL ARTHRODESIS LEVEL 1  09-01-2003   L3 -5 LAMINECTOMY W/ DECOMPRESSION AND FUSION L4-5  . MAXILLECTOMY Right 07/21/14,   [facility]   Radical Neck Dissection, Maxillectomy, Right Marginal Mandibulectomy, dental extractions, Parascauplar  fasciocutaeous Free flap reconstruction  . RETINAL DETACHMENT REPAIR W/ SCLERAL BUCKLE LE  11-03-2000  . ROTATOR CUFF REPAIR Right 12-11-2000  . tonsil biopsy Right 05/24/14  . TRANSURETHRAL RESECTION OF BLADDER TUMOR N/A 02/02/2015   Procedure: TRANSURETHRAL RESECTION OF BLADDER TUMOR (TURBT);  Surgeon: Lowella Bandy, MD;  Location: [facility];  Service: Urology;  Laterality: N/A;    There were no vitals filed for this visit.      Subjective Assessment - 05/08/16 1408    Subjective Pt states he will be using the walk in shower . He received the prescription for the tub bench and knows where to get it.    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 Swelling has been at the operation and has not gone down  On 07/22/2015 at [facility] he had maxillectomy with r rim mandibulectomy, dental extractions, right parascapular fasciocutaneous free flap reconstruction  due to right tonsillar cancer.  He followed up with radiation  competed 10/13/2014.   Patient Stated Goals to get this swelling down   Currently in Pain? No/denies                         [facility] Adult PT Treatment/Exercise - 05/08/16 0001      Knee/Hip Exercises: Standing   Heel Raises Both;5 reps   Knee Flexion AROM;10 reps   Hip Abduction AROM;Both;10 reps   Functional Squat 1 set;10 reps     Shoulder Exercises: Standing   Row Strengthening;Both;20 reps;Theraband  10 with both arms, 10 with alternating arms    Theraband Level (Shoulder Row) Level 1 (Yellow)                        [facility] Goals - 04/17/16 1209      CC Long Term Goal  #1   Title Patient will be independent in a home exercise program for strengthening and stretching   Time 4   Period Weeks   Status New     CC Long Term Goal  #2   Title Patient will demonstrate 3+/5 strength in right hip flexor to allow him to don his pants with  less difficulty   Baseline 3-/5   Time 4   Period Weeks   Status New     CC Long Term Goal  #3   Title Patient will be able to independently manage his edema through use of appropriate compression garments and self MLD   Time 4   Period Weeks   Status New     CC Long Term Goal  #4   Title Decrease LLIS score to less than or equal to 20% impairment for improved function   Baseline 37% impairment   Time 4   Period Weeks  Status New     CC Long Term Goal  #5   Title Patient to demonstrate a 1 cm decrease in edema 8 cm superior to sternal notch around neck    Baseline 44   Time 4   Period Weeks   Status New            Plan - 05/08/16 1625    Clinical Impression Statement Pt arrived late today, but was able to participate well with strengthening exericses and felt that he could follow through at home. Explained shower/tub chair options with patient/family who received prescription and know where to go to [facility] . He participated well today and seemed pleased with his progress    Rehab Potential Good   Clinical Impairments Affecting Rehab Potential Sugery and radiation to neck    PT Treatment/Interventions Manual lymph drainage;Taping;Manual techniques;Therapeutic exercise;ADLs/Self Care Home Management;Patient/family education;Vasopneumatic Device;Passive range of motion;Scar mobilization;Therapeutic activities   PT Next Visit Plan Assess goals next visit as very short visit today . Continue with focus on strengthening and issue HEP for this.  NuStep at beginning or end of treatment.     Consulted and Agree with Plan of Care Patient;Family member/caregiver      Patient will benefit from skilled therapeutic intervention in order to improve the following deficits and impairments:  Increased fascial restricitons, Postural dysfunction, Decreased range of motion, Decreased strength, Impaired UE functional use, Increased edema  Visit Diagnosis: Muscle weakness  (generalized)  Stiffness of right shoulder, not elsewhere classified  Abnormal posture  Lymphedema, not elsewhere classified     Problem List Patient Active Problem List   Diagnosis Date Noted  . Bilateral leg edema 03/28/2016  . Thoracic ascending aortic aneurysm ([place]) 03/28/2016  . Syncope 03/19/2016  . Weakness generalized 03/19/2016  . Macrocytic anemia 03/19/2016  . Pneumonia 03/18/2016  . Drug-induced hypokalemia 10/16/2015  . History of bladder cancer 02/08/2015  . Anemia in neoplastic disease 10/17/2014  . 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 Squamous cell carcinoma of retromolar trigone ([place]) 06/07/2014  . Abnormal weight loss 05/31/2014  . Hemoptysis 05/31/2014  . Cancer of the lip, oral cavity, and pharynx ([place]) 05/30/2014  . CLL (chronic lymphocytic leukemia) ([place]) 07/07/2012  . ALLERGIC RHINITIS 02/09/2008  . 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 OSTEOARTHRITIS 06/09/2007  . LOW BACK PAIN 06/09/2007  . Diabetes mellitus without complication ([place]) 0000000  . Essential hypertension 03/03/2007  . GERD 03/03/2007  . BENIGN PROSTATIC HYPERTROPHY 03/03/2007   Donato Heinz. Owens Shark PT  Norwood Levo 05/08/2016, 4:30 PM  [place], [place], [address] Phone: [phone]   Fax:  [phone]  Name: Timothy Kim MRN: [id number] Date of Birth: May 21, 1930

## 2016-05-08 NOTE — Patient Instructions (Signed)
Sit to Stand / Stand to Sit / Transfers    Sit on edge of a solid chair with arms, feet flat on floor. Lean forward over feet and stand up with hands on chair arms. Sit down slowly with hands on chair arms. Repeat __5-10__ times per session  Copyright  VHI. All rights reserved.    Wrap theraband around a doorknob and close the door that 2 ends of the theraband are handing loose.  Stand facing the door and pull back 10 times with both arms, 10 times with one arm at a time.    Practice standing on one leg for 5 seconds at time

## 2016-05-09 ENCOUNTER — Encounter: Payer: Self-pay | Admitting: Physician Assistant

## 2016-05-13 ENCOUNTER — Ambulatory Visit: Payer: Medicare Other | Admitting: Physical Therapy

## 2016-05-13 DIAGNOSIS — M6281 Muscle weakness (generalized): Secondary | ICD-10-CM

## 2016-05-13 DIAGNOSIS — M25611 Stiffness of right shoulder, not elsewhere classified: Secondary | ICD-10-CM

## 2016-05-13 DIAGNOSIS — R293 Abnormal posture: Secondary | ICD-10-CM | POA: Diagnosis not present

## 2016-05-13 DIAGNOSIS — I890 Lymphedema, not elsewhere classified: Secondary | ICD-10-CM | POA: Diagnosis not present

## 2016-05-13 NOTE — Therapy (Signed)
[place], [place], [address] Phone: [phone]   Fax:  [phone]  Physical Therapy Treatment  Patient Details  Name: Timothy Kim MRN: [id number] Date of Birth: 01-18-1930 Referring Provider: Dr. Isidore Moos  Encounter Date: 05/13/2016      PT End of Session - 05/13/16 1148    Visit Number 5   Number of Visits 9   Date for PT Re-Evaluation 05/15/16   PT Start Time 1100   PT Stop Time 1148   PT Time Calculation (min) 48 min   Activity Tolerance Patient tolerated treatment well   Behavior During Therapy [facility] for tasks assessed/performed      Past Medical History:  Diagnosis Date  . Alcohol abuse   . Allergic rhinitis   . Anemia in neoplastic disease    chronic  . Bilateral lower extremity edema    feet  . Borderline diabetes   . BPH (benign prostatic hypertrophy)   . CLL (chronic lymphocytic leukemia) [facility]) oncologist-  dr Heath Lark ([facility])   dx Jan 2014 --- Stage 1--  currently asymptomatic as of Apr 2016 and No active disease  . DDD (degenerative disc disease), cervical   . DDD (degenerative disc disease), lumbosacral   . 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 Dysphasia functional--  speech therapy   post op  radical neck dissection 07-21-2014--  changed diet to no chewy food , softer foods , states with the changes swallowed okay without any issues  . Glaucoma    both eyes  . History of gastroesophageal reflux (GERD)   . History of radiation therapy    09-04-2014 to 10-13-2014  Right retromolar region (tumor bed) and right neck/  60Gy in 30 fractions to tumor bed and 54Gy in 30 fractions to neck  . History of radiation to head and neck region 09-04-2014 to  10-13-2014   right retromolar region  and right neck  60 Gy  . History of syncope    a. Event monitor 11/17: No evidence of significant AVB or > 3 second pause.; plan ILR if recurrent syncope in the future  . History of tracheostomy    post op radical neck dissection  07-21-2014--  post op tracheostomy on 07-25-2014  decannulated and sutured 07-29-2014  . Hypertension   . Macular degeneration    right eye only  . MRSA (methicillin resistant staph aureus) culture positive   . Nephrolithiasis    right side--  per CT from [facility] 01-17-2015 non-obstructing  . OA (osteoarthritis)   . Organic impotence   . Pulmonary nodule    benign  and stable per last CT  . Radiation-induced dermatitis   . Squamous cell carcinoma of retromolar trigone [facility]) oncologist-  dr Isidore Moos   Stage IVB, pT1b, pN0, Grade 2, +PNI, no LVSI---  S/P  RADICAL NECK DISSECTION AND RADIATION  . Thoracic ascending aortic aneurysm ([place]) 03/28/2016   Chest CTA 9/17: 4.6 cm fusiform ascending thoracic aortic aneurysm >> Plan repeat CTA in 3/18  . 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 Transitional cell carcinoma of bladder [facility]) urologist-- dr Junious Silk   High - grade  . Venous insufficiency     Past Surgical History:  Procedure Laterality Date  .  CATARACT EXTRACTION W/ INTRAOCULAR LENS  IMPLANT, BILATERAL    . CYSTOSCOPY N/A 02/02/2015   Procedure: CYSTOSCOPY, INSTILLATION OF MITOMYCIN C;  Surgeon: Lowella Bandy, MD;  Location: [facility];  Service: Urology;  Laterality: N/A;  . CYSTOSCOPY WITH BIOPSY N/A 03/27/2015   Procedure: CYSTOSCOPY WITH BLADDER  BIOPSY WITH FULGERATION;  Surgeon: Festus Aloe, MD;  Location: [facility];  Service: Urology;  Laterality: N/A;  . INGUINAL HERNIA REPAIR Bilateral right 1988//  left 1970's  . LAMINECTOMY WITH POSTERIOR LATERAL ARTHRODESIS LEVEL 1  09-01-2003   L3 -5 LAMINECTOMY W/ DECOMPRESSION AND FUSION L4-5  . MAXILLECTOMY Right 07/21/14,    [facility]   Radical Neck Dissection, Maxillectomy, Right Marginal Mandibulectomy, dental extractions, Parascauplar fasciocutaeous Free flap reconstruction  . RETINAL DETACHMENT REPAIR W/ SCLERAL BUCKLE LE  11-03-2000  . ROTATOR CUFF REPAIR Right 12-11-2000  . tonsil biopsy Right 05/24/14  . TRANSURETHRAL RESECTION OF BLADDER TUMOR N/A 02/02/2015   Procedure: TRANSURETHRAL RESECTION OF BLADDER TUMOR (TURBT);  Surgeon: Lowella Bandy, MD;  Location: [facility];  Service: Urology;  Laterality: N/A;    There were no vitals filed for this visit.      Subjective Assessment - 05/13/16 1106    Subjective I have not been eating enough and my energy level is very low. I just do not have any energy today.    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 Swelling has been at the operation and has not gone down  On 07/22/2015 at [facility] he had maxillectomy with r rim mandibulectomy, dental extractions, right parascapular fasciocutaneous free flap reconstruction  due to right tonsillar cancer.  He followed up with radiation  competed 10/13/2014.   Patient Stated Goals to get this swelling down   Currently in Pain? No/denies   Pain Score 0-No pain                         [facility] Adult PT Treatment/Exercise - 05/13/16 0001      Lumbar Exercises: Supine   Bridge 10 reps;3 seconds     Knee/Hip Exercises: Standing   Heel Raises Both;10 reps   Knee Flexion AROM;10 reps   Hip Abduction AROM;Both;10 reps   Functional Squat 1 set;10 reps     Knee/Hip Exercises: Seated   Long Arc Quad AROM;Strengthening;Both;1 set;10 reps   [facility] 20 times with 3-5 second holds   Marching Limitations Marching with bil LE's 10 times each LE, 1 set     Knee/Hip Exercises: Supine   Short Arc Quad Sets Both;10 reps;1 set;Strengthening  with 1 lb weight   Straight Leg Raises AROM;Both;2 sets;5 reps  with therapist  assist on RLE so pt could clear mat                        [facility] Goals - 05/13/16 1108      CC Long Term Goal  #1   Title Patient will be independent in a home exercise program for strengthening and stretching   Time 4   Period Weeks   Status On-going     CC Long Term Goal  #2   Title Patient will demonstrate 3+/5 strength in right hip flexor to allow him to don his pants with less difficulty   Baseline 3-/5, 05/13/16- 3-/5   Time 4   Period Weeks  Status On-going     CC Long Term Goal  #3   Title Patient will be able to independently manage his edema through use of appropriate compression garments and self MLD   Baseline 05/13/16- I am a procastinator and the garment makes me claustrophobic, pt has not been wearing his compression stockings   Time 4   Period Weeks   Status On-going     CC Long Term Goal  #4   Title Decrease LLIS score to less than or equal to 20% impairment for improved function   Baseline 37% impairment   Time 4   Period Weeks   Status On-going     CC Long Term Goal  #5   Title Patient to demonstrate a 1 cm decrease in edema 8 cm superior to sternal notch around neck    Baseline 44, 05/13/16- 43.5 cm   Time 4   Period Weeks   Status On-going            Plan - 05/13/16 1149    Clinical Impression Statement Patient arrived today not feeling well. He states he has no energy because he has not been able to eat much. Pt has not been consistent with his home exercise program. Encouraged pt and his wife about the importance of performing HEP daily to progress towards goals in therapy. Assessed pt's goals today in therapy. Pt has not been wearing his compression stockings or his head and neck garment and was encouraged to do so. His neck edema is down 1/2 cm. Pt continues to demonstrate increased weakness of right hip today.    Rehab Potential Good   Clinical Impairments Affecting Rehab Potential Sugery and radiation to neck     PT Frequency 2x / week   PT Duration 4 weeks   PT Treatment/Interventions Manual lymph drainage;Taping;Manual techniques;Therapeutic exercise;ADLs/Self Care Home Management;Patient/family education;Vasopneumatic Device;Passive range of motion;Scar mobilization;Therapeutic activities   PT Next Visit Plan Continue with focus on strengthening and issue HEP for this.  NuStep at beginning or end of treatment.     PT Home Exercise Plan Lower body strengthening   Consulted and Agree with Plan of Care Patient      Patient will benefit from skilled therapeutic intervention in order to improve the following deficits and impairments:  Increased fascial restricitons, Postural dysfunction, Decreased range of motion, Decreased strength, Impaired UE functional use, Increased edema  Visit Diagnosis: Muscle weakness (generalized)  Stiffness of right shoulder, not elsewhere classified     Problem List Patient Active Problem List   Diagnosis Date Noted  . Bilateral leg edema 03/28/2016  . Thoracic ascending aortic aneurysm ([place]) 03/28/2016  . Syncope 03/19/2016  . Weakness generalized 03/19/2016  . Macrocytic anemia 03/19/2016  . Pneumonia 03/18/2016  . Drug-induced hypokalemia 10/16/2015  . History of bladder cancer 02/08/2015  . Anemia in neoplastic disease 10/17/2014  . 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 Squamous cell carcinoma of retromolar trigone ([place]) 06/07/2014  . Abnormal weight loss 05/31/2014  . Hemoptysis 05/31/2014  . Cancer of the lip, oral cavity, and pharynx ([place]) 05/30/2014  . CLL (chronic lymphocytic leukemia) ([place]) 07/07/2012  . ALLERGIC RHINITIS 02/09/2008  . 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 OSTEOARTHRITIS 06/09/2007  . LOW BACK PAIN 06/09/2007  . Diabetes mellitus without complication ([place]) 0000000  . Essential hypertension 03/03/2007  . GERD 03/03/2007  . BENIGN PROSTATIC HYPERTROPHY 03/03/2007    Alexia Freestone 05/13/2016, 11:51 AM  [place],  [place], [address] Phone: [phone]   Fax:  [phone]  Name: Timothy Kim MRN: [id number] Date of Birth: 07-Apr-1930  Allyson Sabal, PT 05/13/16 11:52 AM

## 2016-05-14 ENCOUNTER — Ambulatory Visit: Payer: Medicare Other | Admitting: Physical Therapy

## 2016-05-14 DIAGNOSIS — M6281 Muscle weakness (generalized): Secondary | ICD-10-CM | POA: Diagnosis not present

## 2016-05-14 DIAGNOSIS — I890 Lymphedema, not elsewhere classified: Secondary | ICD-10-CM

## 2016-05-14 DIAGNOSIS — R293 Abnormal posture: Secondary | ICD-10-CM

## 2016-05-14 DIAGNOSIS — M25611 Stiffness of right shoulder, not elsewhere classified: Secondary | ICD-10-CM

## 2016-05-14 NOTE — Therapy (Signed)
[place], [place], [address] Phone: [phone]   Fax:  [phone]  Physical Therapy Treatment  Patient Details  Name: Timothy Kim MRN: [id number] Date of Birth: 28-Aug-1929 Referring Provider: Dr. Isidore Moos  Encounter Date: 05/14/2016      PT End of Session - 05/14/16 1151    Visit Number 6   Number of Visits 9   Date for PT Re-Evaluation 05/15/16   PT Start Time 1100   PT Stop Time 1145   PT Time Calculation (min) 45 min   Activity Tolerance Patient tolerated treatment well   Behavior During Therapy [facility] for tasks assessed/performed      Past Medical History:  Diagnosis Date  . Alcohol abuse   . Allergic rhinitis   . Anemia in neoplastic disease    chronic  . Bilateral lower extremity edema    feet  . Borderline diabetes   . BPH (benign prostatic hypertrophy)   . CLL (chronic lymphocytic leukemia) [facility]) oncologist-  dr Heath Lark ([facility])   dx Jan 2014 --- Stage 1--  currently asymptomatic as of Apr 2016 and No active disease  . DDD (degenerative disc disease), cervical   . DDD (degenerative disc disease), lumbosacral   . 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 Dysphasia functional--  speech therapy   post op  radical neck dissection 07-21-2014--  changed diet to no chewy food , softer foods , states with the changes swallowed okay without any issues  . Glaucoma    both eyes  . History of gastroesophageal reflux (GERD)   . History of radiation therapy    09-04-2014 to 10-13-2014  Right retromolar region (tumor bed) and right neck/  60Gy in 30 fractions to tumor bed and 54Gy in 30 fractions to neck  . History of radiation to head and neck region 09-04-2014 to  10-13-2014   right retromolar region  and right neck  60 Gy  . History of syncope    a. Event monitor 11/17: No evidence of significant AVB or > 3 second pause.; plan ILR if recurrent syncope in the future  . History of tracheostomy    post op radical neck dissection  07-21-2014--  post op tracheostomy on 07-25-2014  decannulated and sutured 07-29-2014  . Hypertension   . Macular degeneration    right eye only  . MRSA (methicillin resistant staph aureus) culture positive   . Nephrolithiasis    right side--  per CT from [facility] 01-17-2015 non-obstructing  . OA (osteoarthritis)   . Organic impotence   . Pulmonary nodule    benign  and stable per last CT  . Radiation-induced dermatitis   . Squamous cell carcinoma of retromolar trigone [facility]) oncologist-  dr Isidore Moos   Stage IVB, pT1b, pN0, Grade 2, +PNI, no LVSI---  S/P  RADICAL NECK DISSECTION AND RADIATION  . Thoracic ascending aortic aneurysm ([place]) 03/28/2016   Chest CTA 9/17: 4.6 cm fusiform ascending thoracic aortic aneurysm >> Plan repeat CTA in 3/18  . 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 Transitional cell carcinoma of bladder [facility]) urologist-- dr Junious Silk   High - grade  . Venous insufficiency     Past Surgical History:  Procedure Laterality Date  .  CATARACT EXTRACTION W/ INTRAOCULAR LENS  IMPLANT, BILATERAL    . CYSTOSCOPY N/A 02/02/2015   Procedure: CYSTOSCOPY, INSTILLATION OF MITOMYCIN C;  Surgeon: Lowella Bandy, MD;  Location: [facility];  Service: Urology;  Laterality: N/A;  . CYSTOSCOPY WITH BIOPSY N/A 03/27/2015   Procedure: CYSTOSCOPY WITH BLADDER  BIOPSY WITH FULGERATION;  Surgeon: Festus Aloe, MD;  Location: [facility];  Service: Urology;  Laterality: N/A;  . INGUINAL HERNIA REPAIR Bilateral right 1988//  left 1970's  . LAMINECTOMY WITH POSTERIOR LATERAL ARTHRODESIS LEVEL 1  09-01-2003   L3 -5 LAMINECTOMY W/ DECOMPRESSION AND FUSION L4-5  . MAXILLECTOMY Right 07/21/14,    [facility]   Radical Neck Dissection, Maxillectomy, Right Marginal Mandibulectomy, dental extractions, Parascauplar fasciocutaeous Free flap reconstruction  . RETINAL DETACHMENT REPAIR W/ SCLERAL BUCKLE LE  11-03-2000  . ROTATOR CUFF REPAIR Right 12-11-2000  . tonsil biopsy Right 05/24/14  . TRANSURETHRAL RESECTION OF BLADDER TUMOR N/A 02/02/2015   Procedure: TRANSURETHRAL RESECTION OF BLADDER TUMOR (TURBT);  Surgeon: Lowella Bandy, MD;  Location[facility];  Service: Urology;  Laterality: N/A;    There were no vitals filed for this visit.      Subjective Assessment - 05/14/16 1108    Subjective Pt states he is not feeling well.  He has difficulty getting up in the morning and getting going.  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 Swelling has been at the operation and has not gone down  On 07/22/2015 at [facility] he had maxillectomy with r rim mandibulectomy, dental extractions, right parascapular fasciocutaneous free flap reconstruction  due to right tonsillar cancer.  He followed up with radiation  competed 10/13/2014.   Patient Stated Goals to get this swelling down   Currently in Pain? No/denies                         [facility] Adult PT Treatment/Exercise - 05/14/16 0001      Self-Care   Self-Care Other Self-Care Comments   Other Self-Care Comments  educated pt and caregiver to look into community silver sneakers programs and/or tai chi classes at the cancer center     Knee/Hip Exercises: Seated   Sit to Sand 10 reps;with UE support  from elevated mat with cues for full extension in standing      Knee/Hip Exercises: Supine   Short Arc Quad Sets Strengthening;Both;2 sets;15 reps   Short Arc Quad Sets Limitations 1 set with no  weight , 1 set with 2.5 pounds on each leg    Straight Leg Raises Strengthening;Both  assist to help lift right leg      Shoulder Exercises: Supine   Protraction Strengthening   Protraction Weight (lbs) 2.5 on left arm no weight on right arm    Flexion Strengthening;Both;10 reps   Shoulder Flexion Weight (lbs) 2.5 punds on left arm, no weight on right arm    Other Supine Exercises bicep curls with 1.5 pound on cane    Other Supine Exercises triceps "skull crushers" 2.5 pounds on left side, 0 weight on left side.                         [facility]  Clinic Goals - 05/13/16 1108      CC Long Term Goal  #1   Title Patient will be independent in a home exercise program for strengthening and stretching   Time 4   Period Weeks   Status On-going     CC Long Term Goal  #2   Title Patient will demonstrate 3+/5 strength in right hip flexor to allow him to don his pants with less difficulty   Baseline 3-/5, 05/13/16- 3-/5   Time 4   Period Weeks   Status On-going     CC Long Term Goal  #3   Title Patient will be able to independently manage his edema through use of appropriate compression garments and self MLD   Baseline 05/13/16- I am a procastinator and the garment makes me claustrophobic, pt has not been wearing his compression stockings   Time 4   Period Weeks   Status On-going     CC Long Term Goal  #4   Title Decrease LLIS score to less than or equal to 20% impairment for improved function   Baseline 37% impairment   Time 4   Period Weeks   Status On-going     CC Long Term Goal  #5   Title Patient to demonstrate a 1 cm decrease in edema 8 cm superior to sternal notch around neck    Baseline 44, 05/13/16- 43.5 cm   Time 4   Period Weeks   Status On-going            Plan - 05/14/16 1152    Clinical Impression Statement Pt continues to struggle with not feeling well and not eating well.  Wife says that he always does better after he comes to therapy.  He  admits to not being able to follow through with home exercise.  Encourage pt and wife to look into community exercise options to continue with activity.  Will need recert or discharge next session    Rehab Potential Good   Clinical Impairments Affecting Rehab Potential Sugery and radiation to neck    PT Frequency 2x / week   PT Duration 4 weeks   PT Treatment/Interventions Manual lymph drainage;Taping;Manual techniques;Therapeutic exercise;ADLs/Self Care Home Management;Patient/family education;Vasopneumatic Device;Passive range of motion;Scar mobilization;Therapeutic activities   PT Next Visit Plan Continue with focus on strengthening and issue HEP for this.  NuStep at beginning or end of treatment.   Recert or discharge    Consulted and Agree with Plan of Care Patient      Patient will benefit from skilled therapeutic intervention in order to improve the following deficits and impairments:  Increased fascial restricitons, Postural dysfunction, Decreased range of motion, Decreased strength, Impaired UE functional use, Increased edema  Visit Diagnosis: Muscle weakness (generalized)  Stiffness of right shoulder, not elsewhere classified  Abnormal posture  Lymphedema, not elsewhere classified     Problem List Patient Active Problem List   Diagnosis Date Noted  . Bilateral leg edema 03/28/2016  . Thoracic ascending aortic aneurysm ([place]) 03/28/2016  . Syncope 03/19/2016  . Weakness generalized 03/19/2016  . Macrocytic anemia 03/19/2016  . Pneumonia 03/18/2016  . Drug-induced hypokalemia 10/16/2015  . History of bladder cancer 02/08/2015  . Anemia in neoplastic disease 10/17/2014  . 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 Squamous cell carcinoma of retromolar trigone ([place]) 06/07/2014  . Abnormal weight loss 05/31/2014  . Hemoptysis 05/31/2014  . Cancer of the lip, oral cavity, and pharynx ([place]) 05/30/2014  . CLL (chronic lymphocytic leukemia) ([place]) 07/07/2012  .  ALLERGIC RHINITIS  02/09/2008  . 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 OSTEOARTHRITIS 06/09/2007  . LOW BACK PAIN 06/09/2007  . Diabetes mellitus without complication ([place]) 94/17/4081  . Essential hypertension 03/03/2007  . GERD 03/03/2007  . BENIGN PROSTATIC HYPERTROPHY 03/03/2007   Donato Heinz. Owens Shark PT   Norwood Levo 05/14/2016, 11:55 AM  [place], [place], [address] Phone: [phone]   Fax:  [phone]  Name: Timothy Kim MRN: [id number] Date of Birth: 27-Mar-1930

## 2016-05-20 ENCOUNTER — Ambulatory Visit: Payer: Medicare Other | Admitting: Physical Therapy

## 2016-05-20 DIAGNOSIS — M6281 Muscle weakness (generalized): Secondary | ICD-10-CM | POA: Diagnosis not present

## 2016-05-20 DIAGNOSIS — M25611 Stiffness of right shoulder, not elsewhere classified: Secondary | ICD-10-CM

## 2016-05-20 DIAGNOSIS — I890 Lymphedema, not elsewhere classified: Secondary | ICD-10-CM | POA: Diagnosis not present

## 2016-05-20 DIAGNOSIS — R293 Abnormal posture: Secondary | ICD-10-CM | POA: Diagnosis not present

## 2016-05-20 NOTE — Therapy (Signed)
[place], [place], [address] Phone: [phone]   Fax:  [phone]  Physical Therapy Treatment  Patient Details  Name: Timothy Kim MRN: [id number] Date of Birth: 07-07-1929 Referring Provider: Dr. Isidore Moos  Encounter Date: 05/20/2016      PT End of Session - 05/20/16 1222    Visit Number 7   Number of Visits 17   Date for PT Re-Evaluation 06/20/16   PT Start Time 1105   PT Stop Time 1145   PT Time Calculation (min) 40 min   Activity Tolerance Patient tolerated treatment well      Past Medical History:  Diagnosis Date  . Alcohol abuse   . Allergic rhinitis   . Anemia in neoplastic disease    chronic  . Bilateral lower extremity edema    feet  . Borderline diabetes   . BPH (benign prostatic hypertrophy)   . CLL (chronic lymphocytic leukemia) [facility]) oncologist-  dr Heath Lark ([facility])   dx Jan 2014 --- Stage 1--  currently asymptomatic as of Apr 2016 and No active disease  . DDD (degenerative disc disease), cervical   . DDD (degenerative disc disease), lumbosacral   . 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 Dysphasia functional--  speech therapy   post op  radical neck dissection 07-21-2014--  changed diet to no chewy food , softer foods , states with the changes swallowed okay without any issues  . Glaucoma    both eyes  . History of gastroesophageal reflux (GERD)   . History of radiation therapy    09-04-2014 to 10-13-2014  Right retromolar region (tumor bed) and right neck/  60Gy in 30 fractions to tumor bed and 54Gy in 30 fractions to neck  . History of radiation to head and neck region 09-04-2014 to  10-13-2014   right retromolar region and right neck  60 Gy  . History of syncope    a. Event  monitor 11/17: No evidence of significant AVB or > 3 second pause.; plan ILR if recurrent syncope in the future  . History of tracheostomy    post op radical neck dissection  07-21-2014--  post op tracheostomy on 07-25-2014  decannulated and sutured 07-29-2014  . Hypertension   . Macular degeneration    right eye only  . MRSA (methicillin resistant staph aureus) culture positive   . Nephrolithiasis    right side--  per [facility] from [facility] 01-17-2015 non-obstructing  . OA (osteoarthritis)   . Organic impotence   . Pulmonary nodule    benign  and stable per last CT  . Radiation-induced dermatitis   . Squamous cell carcinoma of retromolar trigone [facility]) oncologist-  dr Isidore Moos   Stage IVB, pT1b, pN0, Grade 2, +PNI, no LVSI---  S/P  RADICAL NECK DISSECTION AND RADIATION  . Thoracic ascending aortic aneurysm ([place]) 03/28/2016   Chest CTA 9/17: 4.6 cm fusiform ascending thoracic aortic aneurysm >> Plan repeat CTA in 3/18  . 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 Transitional cell carcinoma of bladder [facility]) urologist-- dr Junious Silk   High - grade  . Venous insufficiency     Past Surgical History:  Procedure Laterality Date  . CATARACT EXTRACTION W/ INTRAOCULAR LENS  IMPLANT, BILATERAL    .  CYSTOSCOPY N/A 02/02/2015   Procedure: CYSTOSCOPY, INSTILLATION OF MITOMYCIN C;  Surgeon: Lowella Bandy, MD;  Location: [facility];  Service: Urology;  Laterality: N/A;  . CYSTOSCOPY WITH BIOPSY N/A 03/27/2015   Procedure: CYSTOSCOPY WITH BLADDER  BIOPSY WITH FULGERATION;  Surgeon: Festus Aloe, MD;  Location: [facility];  Service: Urology;  Laterality: N/A;  . INGUINAL HERNIA REPAIR Bilateral right 1988//  left 1970's  . LAMINECTOMY WITH POSTERIOR LATERAL ARTHRODESIS LEVEL 1  09-01-2003   L3 -5 LAMINECTOMY W/ DECOMPRESSION AND FUSION L4-5  . MAXILLECTOMY Right 07/21/14,   [facility]   Radical Neck Dissection, Maxillectomy, Right  Marginal Mandibulectomy, dental extractions, Parascauplar fasciocutaeous Free flap reconstruction  . RETINAL DETACHMENT REPAIR W/ SCLERAL BUCKLE LE  11-03-2000  . ROTATOR CUFF REPAIR Right 12-11-2000  . tonsil biopsy Right 05/24/14  . TRANSURETHRAL RESECTION OF BLADDER TUMOR N/A 02/02/2015   Procedure: TRANSURETHRAL RESECTION OF BLADDER TUMOR (TURBT);  Surgeon: Lowella Bandy, MD;  Location: [facility];  Service: Urology;  Laterality: N/A;    There were no vitals filed for this visit.      Subjective Assessment - 05/20/16 1114    Subjective Pt states he is not any better off than he was 4 weeks ago.  Pt attributes it to the fact that he is not getting the nutrition he needs.  He is just not able to eat because he cannot chew well because his jaws are misaligned.  After about 5 or 10 minutes he is just worn out.  His back is not any better.  He has not been able to follow through with exercise at home. He does not think another visit to the nutrutionist necessary. He does not want to go to a communtiy exercise program.    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 Swelling has been at the operation and has not gone down  On 07/22/2015 at [facility] he had maxillectomy with r rim mandibulectomy, dental extractions, right parascapular fasciocutaneous free flap reconstruction  due to right tonsillar cancer.  He followed up with radiation  competed 10/13/2014.            [facility] PT Assessment - 05/20/16 0001      Ambulation/Gait   Gait velocity .94 m/s or community ambulator                      [facility] Adult PT Treatment/Exercise - 05/20/16 0001      Self-Care   Self-Care Other Self-Care Comments   Other Self-Care Comments  encouraged pt and caregiver to see Mike Craze for survivorship assist      Knee/Hip Exercises: Standing   Other Standing  Knee Exercises 2 sets of 10 reps of sit to stand with arm assist.    Other Standing Knee Exercises 3 minutes of standing weight shift with arm movement, encouraged pt to dance at home to at least one song.                PT Education - 05/20/16 1221    Education provided Yes   Education Details HEP for sit to stand and dancing for weight shift and endurance    Person(s) Educated Patient;Spouse   Methods Explanation;Demonstration;Handout   Comprehension Verbalized understanding;Returned demonstration                [facility]  Goals - 05/20/16 1124      CC Long Term Goal  #1   Title Patient will be independent in a home exercise program for strengthening and stretching   Baseline Pt states he doesn't have a detailed memory and wants more written exercises    Time 4   Period Weeks   Status On-going     CC Long Term Goal  #2   Title Patient will demonstrate 3+/5 strength in right hip flexor to allow him to don his pants with less difficulty   Baseline 3-/5, 05/13/16- 3-/5. 3/5 on 05/20/2016 still has difficulty with donning pants    Time 4   Period Weeks   Status On-going     CC Long Term Goal  #3   Title Patient will be able to independently manage his edema through use of appropriate compression garments and self MLD   Baseline 05/13/16- I am a procastinator and the garment makes me claustrophobic, pt has not been wearing his compression stockings. 04/24/2016 pt states he feels that swelling is increasing a little bit, but he still hasn't worn "the mask" much    Time 4   Period Weeks   Status On-going     CC Long Term Goal  #4   Title Decrease LLIS score to less than or equal to 20% impairment for improved function   Baseline 37% impairment   Status On-going     CC Long Term Goal  #5   Title Patient to demonstrate a 1 cm decrease in edema 8 cm superior to sternal notch around neck    Baseline 44, 05/13/16- 43.5 cm. 41.5 on 05/20/2016   Status Achieved             Plan - 05/20/16 1222    Clinical Impression Statement Pt feels that he has not made progress.  He states he knows what he should do but is not following up at home. He does not want to do community exercise because he is self consious about the way he looks. Wife states he does better after coming here, but he has not been able to follow through on his own. Feel that he would benefit from survivorship counselling and they said they had a good appt. with Elzie Rings in the past and think they may have another upcoming.  Would like to continue working with him for antoher 4 weeks to fine tune a home program he can follow through with and help him transition to [employer].    Rehab Potential Good   Clinical Impairments Affecting Rehab Potential Sugery and radiation to neck    PT Frequency 2x / week   PT Duration 4 weeks   PT Treatment/Interventions Manual lymph drainage;Taping;Manual techniques;Therapeutic exercise;ADLs/Self Care Home Management;Patient/family education;Vasopneumatic Device;Passive range of motion;Scar mobilization;Therapeutic activities;Balance training   PT Next Visit Plan Continue with focus on strengthening and fine tune HEP for this.  NuStep at beginning or end of treatment.     Consulted and Agree with Plan of Care Patient;Family member/caregiver   Family Member Consulted Partner Belenda Cruise)      Patient will benefit from skilled therapeutic intervention in order to improve the following deficits and impairments:  Increased fascial restricitons, Postural dysfunction, Decreased range of motion, Decreased strength, Impaired UE functional use, Increased edema  Visit Diagnosis: Muscle weakness (generalized)  Stiffness of right shoulder, not elsewhere classified  Abnormal posture  Lymphedema, not elsewhere classified     Problem List Patient Active Problem List   Diagnosis  Date Noted  . Bilateral leg edema 03/28/2016  . Thoracic ascending aortic  aneurysm ([place]) 03/28/2016  . Syncope 03/19/2016  . Weakness generalized 03/19/2016  . Macrocytic anemia 03/19/2016  . Pneumonia 03/18/2016  . Drug-induced hypokalemia 10/16/2015  . History of bladder cancer 02/08/2015  . Anemia in neoplastic disease 10/17/2014  . 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 Squamous cell carcinoma of retromolar trigone ([place]) 06/07/2014  . Abnormal weight loss 05/31/2014  . Hemoptysis 05/31/2014  . Cancer of the lip, oral cavity, and pharynx ([place]) 05/30/2014  . CLL (chronic lymphocytic leukemia) ([place]) 07/07/2012  . ALLERGIC RHINITIS 02/09/2008  . 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 OSTEOARTHRITIS 06/09/2007  . LOW BACK PAIN 06/09/2007  . Diabetes mellitus without complication ([place]) 0000000  . Essential hypertension 03/03/2007  . GERD 03/03/2007  . BENIGN PROSTATIC HYPERTROPHY 03/03/2007   Donato Heinz. Owens Shark PT  Norwood Levo 05/20/2016, 12:29 PM  [place], [place], [address] Phone: [phone]   Fax:  [phone]  Name: DEVONTEZ ASCHER MRN: [id number] Date of Birth: [birth year]

## 2016-05-20 NOTE — Patient Instructions (Signed)
Sit to Stand / Stand to Sit / Transfers    Sit on edge of a solid chair with arms, feet flat on floor. Lean forward over feet and stand up with hands on chair arms. Sit down slowly with hands on chair arms. Repeat __10__ times per session. Do __2__ sessions per day.  Copyright  VHI. All rights reserved.    Put some music and dance for 1 song! :-)

## 2016-05-22 ENCOUNTER — Ambulatory Visit: Payer: Medicare Other

## 2016-05-22 DIAGNOSIS — R293 Abnormal posture: Secondary | ICD-10-CM | POA: Diagnosis not present

## 2016-05-22 DIAGNOSIS — M25611 Stiffness of right shoulder, not elsewhere classified: Secondary | ICD-10-CM

## 2016-05-22 DIAGNOSIS — M6281 Muscle weakness (generalized): Secondary | ICD-10-CM | POA: Diagnosis not present

## 2016-05-22 DIAGNOSIS — I890 Lymphedema, not elsewhere classified: Secondary | ICD-10-CM | POA: Diagnosis not present

## 2016-05-22 NOTE — Therapy (Addendum)
[place], [place], [address] Phone: [phone]   Fax:  [phone]  Physical Therapy Treatment  Patient Details  Name: Timothy Kim MRN: [id number] Date of Birth: [birth year] Referring Provider: Dr. Isidore Moos  Encounter Date: 05/22/2016      PT End of Session - 05/22/16 1203    Visit Number 8   Number of Visits 17   Date for PT Re-Evaluation 06/20/16   PT Start Time 1108   PT Stop Time 1157   PT Time Calculation (min) 49 min   Activity Tolerance Patient tolerated treatment well   Behavior During Therapy [facility] for tasks assessed/performed      Past Medical History:  Diagnosis Date  . Alcohol abuse   . Allergic rhinitis   . Anemia in neoplastic disease    chronic  . Bilateral lower extremity edema    feet  . Borderline diabetes   . BPH (benign prostatic hypertrophy)   . CLL (chronic lymphocytic leukemia) [facility]) oncologist-  dr Heath Lark ([facility])   dx Jan 2014 --- Stage 1--  currently asymptomatic as of Apr 2016 and No active disease  . DDD (degenerative disc disease), cervical   . DDD (degenerative disc disease), lumbosacral   . 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 Dysphasia functional--  speech therapy   post op  radical neck dissection 07-21-2014--  changed diet to no chewy food , softer foods , states with the changes swallowed okay without any issues  . Glaucoma    both eyes  . History of gastroesophageal reflux (GERD)   . History of radiation therapy    09-04-2014 to 10-13-2014  Right retromolar region (tumor bed) and right neck/  60Gy in 30 fractions to tumor bed and 54Gy in 30 fractions to neck  . History of radiation to head and neck region 09-04-2014 to  10-13-2014   right retromolar region  and right neck  60 Gy  . History of syncope    a. Event monitor 11/17: No evidence of significant AVB or > 3 second pause.; plan ILR if recurrent syncope in the future  . History of tracheostomy    post op radical neck dissection  07-21-2014--  post op tracheostomy on 07-25-2014  decannulated and sutured 07-29-2014  . Hypertension   . Macular degeneration    right eye only  . MRSA (methicillin resistant staph aureus) culture positive   . Nephrolithiasis    right side--  per CT from [facility] 01-17-2015 non-obstructing  . OA (osteoarthritis)   . Organic impotence   . Pulmonary nodule    benign  and stable per last CT  . Radiation-induced dermatitis   . Squamous cell carcinoma of retromolar trigone [facility]) oncologist-  dr Isidore Moos   Stage IVB, pT1b, pN0, Grade 2, +PNI, no LVSI---  S/P  RADICAL NECK DISSECTION AND RADIATION  . Thoracic ascending aortic aneurysm ([place]) 03/28/2016   Chest CTA 9/17: 4.6 cm fusiform ascending thoracic aortic aneurysm >> Plan repeat CTA in 3/18  . 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 Transitional cell carcinoma of bladder [facility]) urologist-- dr Junious Silk   High - grade  . Venous insufficiency     Past Surgical History:  Procedure Laterality Date  .  CATARACT EXTRACTION W/ INTRAOCULAR LENS  IMPLANT, BILATERAL    . CYSTOSCOPY N/A 02/02/2015   Procedure: CYSTOSCOPY, INSTILLATION OF MITOMYCIN C;  Surgeon: Lowella Bandy, MD;  Location: [facility];  Service: Urology;  Laterality: N/A;  . CYSTOSCOPY WITH BIOPSY N/A 03/27/2015   Procedure: CYSTOSCOPY WITH BLADDER  BIOPSY WITH FULGERATION;  Surgeon: Festus Aloe, MD;  Location: [facility];  Service: Urology;  Laterality: N/A;  . INGUINAL HERNIA REPAIR Bilateral right 1988//  left 1970's  . LAMINECTOMY WITH POSTERIOR LATERAL ARTHRODESIS LEVEL 1  09-01-2003   L3 -5 LAMINECTOMY W/ DECOMPRESSION AND FUSION L4-5  . MAXILLECTOMY Right 07/21/14,    [facility]   Radical Neck Dissection, Maxillectomy, Right Marginal Mandibulectomy, dental extractions, Parascauplar fasciocutaeous Free flap reconstruction  . RETINAL DETACHMENT REPAIR W/ SCLERAL BUCKLE LE  11-03-2000  . ROTATOR CUFF REPAIR Right 12-11-2000  . tonsil biopsy Right 05/24/14  . TRANSURETHRAL RESECTION OF BLADDER TUMOR N/A 02/02/2015   Procedure: TRANSURETHRAL RESECTION OF BLADDER TUMOR (TURBT);  Surgeon: Lowella Bandy, MD;  [facility];  Service: Urology;  Laterality: N/A;    There were no vitals filed for this visit.      Subjective Assessment - 05/22/16 1119    Subjective Pt reports feeling really dizzy today which is effecting his balance. It has been getting worse as of late. Did a few of my exercises yesterday and it went okay but tried the sit-stand one and I could only do 2 due to feeling so weak.    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 Swelling has been at the operation and has not gone down  On 07/22/2015 at [facility] he had maxillectomy with r rim mandibulectomy, dental extractions, right parascapular fasciocutaneous free flap reconstruction  due to right tonsillar cancer.  He followed up with radiation  competed 10/13/2014.   Patient Stated Goals to get this swelling down   Currently in Pain? No/denies                         [facility] Adult PT Treatment/Exercise - 05/22/16 0001      Self-Care   Self-Care Other Self-Care Comments   Other Self-Care Comments  Spent time today discussing with pt and wife about seeking counsel/making an appt with Mike Craze the nurse navigator as she can direct him to someone to talk to about his increasing dizzines and to also discuss his lack of nutrition/appetite concerns due to lack of appetite. Pt really wants to do more but is feeling held back by his lack of endurance and now dizziness that has been getting  worse/more frequent as of late.      Elbow Exercises   Elbow Flexion Strengthening;Both;20 reps;Seated  2 lbs with 2 sets of 10     Knee/Hip Exercises: Seated   Long Arc Quad AROM;Strengthening;Both;2 sets;5 reps  5 second holds   [facility] 2 sets of 10 times with 5 second hold bolster squeezes.   Knee/Hip Flexion Alternate marching 10 times each leg.   Hamstring Curl Strengthening;Both;10 reps  Therapist holding yellow theraband     Shoulder Exercises: Seated   External Rotation Strengthening;Both;10 reps;Theraband   Theraband Level (Shoulder External Rotation) Level 1 (Yellow)   External Rotation Limitations Limited ROM with Rt UE   Flexion Strengthening;10 reps;Theraband   Theraband Level (Shoulder Flexion) Level 1 (Yellow)   Flexion Limitations With arms  at shoulder width and slight pull on yellow theraband flexing up to about 60 degrees due to Rt shoulder    Other Seated Exercises Bil D2 with yellow theraband 10 times each but modified on Rt due to limited shouder ROM.                PT Education - 05/22/16 1200    Education provided Yes   Education Details Issued sheet with some seated and standing LE exercises so as pt can options to work these in througout his day. Spent time educating pt on importance of trying to do what he can when he can and also try to incorporate walkingmore often as able around the house for at least 5-7 minutes every few hours and increase this as able.    Person(s) Educated Patient;Spouse   Methods Explanation;Demonstration;Handout   Comprehension Verbalized understanding;Returned demonstration;Need further instruction                [facility] Goals - 05/20/16 1124      CC Long Term Goal  #1   Title Patient will be independent in a home exercise program for strengthening and stretching   Baseline Pt states he doesn't have a detailed memory and wants more written exercises    Time 4   Period Weeks   Status On-going      CC Long Term Goal  #2   Title Patient will demonstrate 3+/5 strength in right hip flexor to allow him to don his pants with less difficulty   Baseline 3-/5, 05/13/16- 3-/5. 3/5 on 05/20/2016 still has difficulty with donning pants    Time 4   Period Weeks   Status On-going     CC Long Term Goal  #3   Title Patient will be able to independently manage his edema through use of appropriate compression garments and self MLD   Baseline 05/13/16- I am a procastinator and the garment makes me claustrophobic, pt has not been wearing his compression stockings. 04/24/2016 pt states he feels that swelling is increasing a little bit, but he still hasn't worn "the mask" much    Time 4   Period Weeks   Status On-going     CC Long Term Goal  #4   Title Decrease LLIS score to less than or equal to 20% impairment for improved function   Baseline 37% impairment   Status On-going     CC Long Term Goal  #5   Title Patient to demonstrate a 1 cm decrease in edema 8 cm superior to sternal notch around neck    Baseline 44, 05/13/16- 43.5 cm. 41.5 on 05/20/2016   Status Achieved            Plan - 05/22/16 1203    Clinical Impression Statement Pt did very well today though we had to stay seated due to him having increased dizziness which he says has progressively been getting worse as of late. He wanted to start with NuStep but due to dizziness felt the walk to [facility] wasn't doable today. Suggested strongly to pt and wife that they call and make an appt with Mike Craze, nurse navigator, to discuss his increasing dizziness and lack of appetite. Also suggested they get back in touch with nutrionist due to his lack of appetite as well. They verbalized understanding and agreed to start with Elzie Rings as his next scheduled visit isn't until April. Pt did very well with understanding and technique of exercises today and seemed  encouraged to start trying doing exercises as able throughout day as this may  be easier than alot at once.    Rehab Potential Good   Clinical Impairments Affecting Rehab Potential Sugery and radiation to neck    PT Frequency 2x / week   PT Duration 4 weeks   PT Treatment/Interventions Manual lymph drainage;Taping;Manual techniques;Therapeutic exercise;ADLs/Self Care Home Management;Patient/family education;Vasopneumatic Device;Passive range of motion;Scar mobilization;Therapeutic activities;Balance training   PT Next Visit Plan Continue with focus on strengthening and fine tune HEP for this.  NuStep at beginning or end of treatment when able.   PT Home Exercise Plan Lower body strengthening in sitting and standing; also to begin walking routine and to document this to track his progress   Consulted and Agree with Plan of Care Patient      Patient will benefit from skilled therapeutic intervention in order to improve the following deficits and impairments:  Increased fascial restricitons, Postural dysfunction, Decreased range of motion, Decreased strength, Impaired UE functional use, Increased edema  Visit Diagnosis: Muscle weakness (generalized)  Stiffness of right shoulder, not elsewhere classified  Abnormal posture     Problem List Patient Active Problem List   Diagnosis Date Noted  . Bilateral leg edema 03/28/2016  . Thoracic ascending aortic aneurysm ([place]) 03/28/2016  . Syncope 03/19/2016  . Weakness generalized 03/19/2016  . Macrocytic anemia 03/19/2016  . Pneumonia 03/18/2016  . Drug-induced hypokalemia 10/16/2015  . History of bladder cancer 02/08/2015  . Anemia in neoplastic disease 10/17/2014  . 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 Squamous cell carcinoma of retromolar trigone ([place]) 06/07/2014  . Abnormal weight loss 05/31/2014  . Hemoptysis 05/31/2014  . Cancer of the lip, oral cavity, and pharynx ([place]) 05/30/2014  . CLL (chronic lymphocytic leukemia) ([place]) 07/07/2012  . ALLERGIC RHINITIS 02/09/2008  . 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  OSTEOARTHRITIS 06/09/2007  . LOW BACK PAIN 06/09/2007  . Diabetes mellitus without complication ([place]) 16/03/9603  . Essential hypertension 03/03/2007  . GERD 03/03/2007  . BENIGN PROSTATIC HYPERTROPHY 03/03/2007    Otelia Limes, PTA 05/22/2016, 12:11 PM  [place], [place], [address] Phone: [phone]   Fax:  [phone]  Name: GIORDANO GETMAN MRN: [id number] Date of Birth: 02-02-1930   PHYSICAL THERAPY DISCHARGE SUMMARY  Visits from Start of Care: 8  Current functional level related to goals / functional outcomes: As above   Remaining deficits: As above    Education / Equipment: Home exercise  Plan: Patient agrees to discharge.  Patient goals were partially met. Patient is being discharged due to not returning since the last visit.  ?????    [place]ry Diego, PT 08/11/17 11:37 AM

## 2016-05-22 NOTE — Patient Instructions (Signed)
Cancer Rehab [phone] Start sprinkling exercises in throughout your day and as able!!   Also begin walking numerous times a day for as long as able (start at least 5-7 minutes a time.)    KNEE: Extension, Long Arc Quads - Sitting    Raise leg until knee is straight. _10__ reps per set, hold for 5 seconds. _2-3__ sets per day.   Copyright  VHI. All rights reserved.   -Seated marching 10-20 times, slowly and controlled. 2-3 times a day at least.   -Pillow squeeze holding 5 seconds each time, 20 times. 2-3 times a day at least.   Then standing at counter do 3 way leg swings:  Marching in place Side leg swings Back leg swings  10 times each leg, 1-2 sets. 2-3 times a day at least

## 2016-05-27 ENCOUNTER — Ambulatory Visit: Payer: Medicare Other

## 2016-05-27 ENCOUNTER — Other Ambulatory Visit: Payer: Self-pay | Admitting: Internal Medicine

## 2016-05-29 ENCOUNTER — Encounter: Payer: Self-pay | Admitting: Physical Therapy

## 2016-05-29 DIAGNOSIS — C672 Malignant neoplasm of lateral wall of bladder: Secondary | ICD-10-CM | POA: Diagnosis not present

## 2016-05-29 DIAGNOSIS — Z8551 Personal history of malignant neoplasm of bladder: Secondary | ICD-10-CM | POA: Diagnosis not present

## 2016-05-30 ENCOUNTER — Other Ambulatory Visit: Payer: Self-pay | Admitting: Internal Medicine

## 2016-06-03 ENCOUNTER — Encounter: Payer: Self-pay | Admitting: Physical Therapy

## 2016-06-03 DIAGNOSIS — J329 Chronic sinusitis, unspecified: Secondary | ICD-10-CM | POA: Diagnosis not present

## 2016-06-04 ENCOUNTER — Ambulatory Visit: Payer: Medicare Other

## 2016-06-10 ENCOUNTER — Encounter: Payer: Self-pay | Admitting: Physical Therapy

## 2016-06-11 ENCOUNTER — Encounter: Payer: Self-pay | Admitting: Cardiology

## 2016-06-11 ENCOUNTER — Ambulatory Visit (INDEPENDENT_AMBULATORY_CARE_PROVIDER_SITE_OTHER): Payer: Medicare Other | Admitting: Cardiology

## 2016-06-11 VITALS — BP 114/62 | HR 80 | Ht 68.0 in | Wt 132.0 lb

## 2016-06-11 DIAGNOSIS — I10 Essential (primary) hypertension: Secondary | ICD-10-CM

## 2016-06-11 DIAGNOSIS — R55 Syncope and collapse: Secondary | ICD-10-CM

## 2016-06-11 DIAGNOSIS — I7121 Aneurysm of the ascending aorta, without rupture: Secondary | ICD-10-CM

## 2016-06-11 DIAGNOSIS — I712 Thoracic aortic aneurysm, without rupture: Secondary | ICD-10-CM

## 2016-06-11 DIAGNOSIS — R109 Unspecified abdominal pain: Secondary | ICD-10-CM | POA: Diagnosis not present

## 2016-06-11 DIAGNOSIS — E43 Unspecified severe protein-calorie malnutrition: Secondary | ICD-10-CM

## 2016-06-11 DIAGNOSIS — R600 Localized edema: Secondary | ICD-10-CM | POA: Diagnosis not present

## 2016-06-11 MED ORDER — FUROSEMIDE 20 MG PO TABS: 20.0000 mg | ORAL_TABLET | Freq: Two times a day (BID) | ORAL | 3 refills | Status: DC

## 2016-06-11 NOTE — Progress Notes (Signed)
Cardiology Office Note:    Date:  06/11/2016   ID:  Timothy Kim, DOB 1929-07-04, MRN [id number]  PCP:  [name], MD  Cardiologist:  New - seen by Dr. Ena Dawley today Electrophysiologist:  n/a  Referring MD: Marletta Lor, MD   No chief complaint on file.   History of Present Illness:    Timothy Kim is a 82 y.o. male with a hx of CLL, oral CA, bladder CA, HTN, DM, LE edema Admitted 9/26-9/30 with syncope. He was not seen by Cardiology. Chest CT was suggestive of community-acquired pneumonia.  He was tx with antibiotics.  Echo demonstrated normal EF. CTA did show 4.6 cm ascending thoracic aortic aneurysm. Head CT was non-acute.  Troponin levels were minimally elevated without any trend (0.03 - 0.03 - 0.03).  The DC summary indicates that Tele demonstrated bradycardia and pauses (I cannot find documentation of this in the chart).  His beta-blocker was held then reduced.  He is referred by Dr. Hosie Poisson of [facility] to evaluated syncope.  He is here today with his partner, Timothy Kim.  He notes 2 episodes of syncope since July.  His first episode was 7/21.  He was walking to his back deck and passed out.  There was no prodrome.  He denies diaphoresis or confusion upon awakening.  He denies chest pain. He is sedentary.  He denies shortness of breath.  He denies orthopnea, PND.  He does note some dizziness with standing. He denies recurrent syncope.    06/11/2016 - This is a 6 weeks follow-up, the patient's major problem is inability to eat secondary to his jaw problem with low protein diet and protein replacement, patient states that his lower extremity edema has been chronic persistent despite Lasix and use of compression stockings. He has been losing weight. He denies any orthopnea or proximal nocturnal dyspnea. He has noticed abdominal pain in the mid epigastrium that resolves with food intake. He denies any constipation or diarrhea. Denies any chest  pain.  Chest CTA 03/18/16 IMPRESSION: Negative for pulmonary embolism. Volume loss and patchy densities in the right lower lobe. Cannot exclude a small focus of infection or aspiration in this area. Fusiform aneurysm of the ascending thoracic aorta measuring up to 4.6 cm. No evidence for an aortic dissection. Ascending thoracic aortic aneurysm. Recommend semi-annual imaging followup by CTA or MRA and referral to cardiothoracic surgery if not already obtained.  Stable right lower lobe pulmonary nodule. Moderate size hiatal hernia.   PAD Screen 03/28/2016  Previous PAD dx? No  Previous surgical procedure? No  Pain with walking? No  Feet/toe relief with dangling? Yes  Painful, non-healing ulcers? No  Extremities discolored? No     Prior CV studies that were reviewed today include:    Echo 03/19/16 Vigorous LVF, EF 65-70%, normal wall motion, Gr 1 DD, normal RVSF, mild TR, PASP 34 mmHg   Past Medical History:  Diagnosis Date  . Alcohol abuse   . Allergic rhinitis   . Anemia in neoplastic disease    chronic  . Bilateral lower extremity edema    feet  . Borderline diabetes   . BPH (benign prostatic hypertrophy)   . CLL (chronic lymphocytic leukemia) [facility]) oncologist-  dr Heath Lark ([facility])   dx Jan 2014 --- Stage 1--  currently asymptomatic as of Apr 2016 and No active disease  . DDD (degenerative disc disease), cervical   . DDD (degenerative disc disease), lumbosacral   . 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 Dysphasia functional--  speech therapy   post op  radical neck dissection 07-21-2014--  changed diet to no chewy food , softer foods , states with the changes swallowed okay without any issues  . Glaucoma    both eyes  . History of gastroesophageal reflux (GERD)   . History of  radiation therapy    09-04-2014 to 10-13-2014  Right retromolar region (tumor bed) and right neck/  60Gy in 30 fractions to tumor bed and 54Gy in 30 fractions to neck  . History of radiation to head and neck region 09-04-2014 to  10-13-2014   right retromolar region and right neck  60 Gy  . History of syncope    a. Event monitor 11/17: No evidence of significant AVB or > 3 second pause.; plan ILR if recurrent syncope in the future  . History of tracheostomy    post op radical neck dissection  07-21-2014--  post op tracheostomy on 07-25-2014  decannulated and sutured 07-29-2014  . Hypertension   . Macular degeneration    right eye only  . MRSA (methicillin resistant staph aureus) culture positive   . Nephrolithiasis    right side--  per CT from [facility] 01-17-2015 non-obstructing  . OA (osteoarthritis)   . Organic impotence   . Pulmonary nodule    benign  and stable per last CT  . Radiation-induced dermatitis   . Squamous cell carcinoma of retromolar trigone [facility]) oncologist-  dr Isidore Moos   Stage IVB, pT1b, pN0, Grade 2, +PNI, no LVSI---  S/P  RADICAL NECK DISSECTION AND RADIATION  . Thoracic ascending aortic aneurysm ([place]) 03/28/2016   Chest CTA 9/17: 4.6 cm fusiform ascending thoracic aortic aneurysm >> Plan repeat CTA in 3/18  . 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 Transitional cell carcinoma of bladder [facility]) urologist-- dr Junious Silk   High - grade  . Venous insufficiency     Past Surgical History:  Procedure Laterality Date  . CATARACT EXTRACTION W/ INTRAOCULAR LENS  IMPLANT, BILATERAL    . CYSTOSCOPY N/A 02/02/2015   Procedure: CYSTOSCOPY, INSTILLATION OF MITOMYCIN C;  Surgeon: Lowella Bandy, MD;  Location: [facility];  Service: Urology;  Laterality: N/A;  . CYSTOSCOPY WITH BIOPSY N/A 03/27/2015   Procedure: CYSTOSCOPY WITH BLADDER  BIOPSY WITH FULGERATION;  Surgeon: Festus Aloe, MD;  Location[facility];  Service: Urology;  Laterality: N/A;  . INGUINAL HERNIA REPAIR Bilateral right 1988//  left 1970's  . LAMINECTOMY WITH POSTERIOR LATERAL ARTHRODESIS LEVEL 1  09-01-2003   L3 -5 LAMINECTOMY W/ DECOMPRESSION AND FUSION L4-5  . MAXILLECTOMY Right 07/21/14,   [facility]   Radical Neck Dissection, Maxillectomy, Right Marginal Mandibulectomy, dental extractions, Parascauplar fasciocutaeous Free flap reconstruction  . RETINAL DETACHMENT REPAIR W/ SCLERAL BUCKLE LE  11-03-2000  . ROTATOR CUFF REPAIR Right 12-11-2000  . tonsil biopsy Right 05/24/14  . TRANSURETHRAL RESECTION OF BLADDER TUMOR N/A 02/02/2015   Procedure: TRANSURETHRAL RESECTION OF BLADDER TUMOR (TURBT);  Surgeon: Lowella Bandy, MD;  [facility];  Service: Urology;  Laterality: N/A;    Current Medications: Current Meds  Medication Sig  . brimonidine-timolol (COMBIGAN) 0.2-0.5 % ophthalmic solution Place 1 drop into both eyes 2 (two) times daily.   Marland Kitchen  DHEA 50 MG TABS Take 1 tablet by mouth daily.  . feeding supplement, ENSURE ENLIVE, (ENSURE ENLIVE) LIQD Take 237 mLs by mouth 2 (two) times daily between meals.  . fluticasone (FLONASE) 50 MCG/ACT nasal spray Place 2 sprays into both nostrils daily.  Javier Docker Oil 300 MG CAPS Take 300 mg by mouth daily.  Marland Kitchen latanoprost (XALATAN) 0.005 % ophthalmic solution Place 1 drop into both eyes every evening.   . Lutein-Zeaxanthin 15-0.7 MG CAPS Take 1 tablet by mouth daily.  . Melatonin 10 MG TABS Take 10 mg by mouth at bedtime as needed (for sleep).  . metoprolol succinate (TOPROL XL) 25 MG 24 hr tablet Take 1 tablet (25 mg total) by mouth daily.  . potassium chloride SA (K-DUR,KLOR-CON) 20 MEQ tablet Take 1 tablet (20 mEq total) by mouth every other day.  . sodium chloride (OCEAN) 0.65 % nasal spray Place 2 sprays into the nose as needed for congestion.   . sodium fluoride (DENTA 5000 PLUS) 1.1 % CREA dental cream Apply a thin ribbon of gel to tooth brush. Brush teeth  for 2 minutes. Spit out excess. Repeat nightly. (Patient taking differently: Place 1 application onto teeth at bedtime. Apply a thin ribbon of gel to tooth brush. Brush teeth for 2 minutes. Spit out excess. Repeat nightly.)  . traMADol (ULTRAM) 50 MG tablet Take 1 tablet (50 mg total) by mouth every 6 (six) hours as needed for moderate pain.  Marland Kitchen triamcinolone cream (KENALOG) 0.1 % Apply 1 application topically 2 (two) times daily.  Marland Kitchen zolpidem (AMBIEN) 5 MG tablet TAKE 1 TABLET BY MOUTH AT BEDTIME AS NEEDED FOR SLEEP  . [DISCONTINUED] furosemide (LASIX) 20 MG tablet Take 1 tablet (20 mg total) by mouth every other day.     Allergies:   Patient has no known allergies.   Social History   Social History  . Marital status: Single    Spouse name: N/A  . Number of children: 2  . Years of education: N/A   Social History Main Topics  . Smoking status: Never Smoker  . Smokeless tobacco: Never Used  . Alcohol use Yes     Comment: Gin and vodka 3-4 daily   (Hx alcohol withdrawal )  . Drug use: No  . Sexual activity: Not Asked   Other Topics Concern  . None   Social History Narrative   Patient is divorced with 2 children.   Patient has never smoked. Patient has never used smokeless tobacco.    Patient drinks gin or vodka on a daily basis.   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   ROS:   Please see the history of present illness.    ROS All other systems reviewed and are negative.   EKGs/Labs/Other Test Reviewed:    EKG:  EKG is  ordered today.  The ekg ordered today demonstrates NSR, HR 91, normal axis, low voltage, QTc 455 ms  Recent Labs: 10/16/2015: TSH 1.694 03/18/2016: B Natriuretic Peptide 295.7 03/19/2016: ALT 25 04/14/2016: BUN 8; Creatinine, Ser 0.86; Hemoglobin 12.4;  Platelets 182.0; Potassium 4.8; Sodium 136   Recent Lipid Panel    Component Value Date/Time   CHOL 203 (H) 01/07/2012 0936   TRIG 65.0 01/07/2012 0936   HDL 79.20  01/07/2012 0936   CHOLHDL 3 01/07/2012 0936   VLDL 13.0 01/07/2012 0936   LDLCALC 101 (H) 12/12/2009 0000   LDLDIRECT 104.4 01/07/2012 0936     Physical Exam:    VS:  BP 114/62   Pulse 80   Ht 5\' 8"  (1.727 m)   Wt 132 lb (59.9 kg)   BMI 20.07 kg/m     No data found.    Wt Readings from Last 3 Encounters:  06/11/16 132 lb (59.9 kg)  04/14/16 140 lb 4 oz (63.6 kg)  04/04/16 142 lb 9.6 oz (64.7 kg)     Physical Exam  Constitutional: He is oriented to person, place, and time. He appears well-developed. He appears cachectic. No distress.  HENT:  Head: Normocephalic and atraumatic.  Eyes: No scleral icterus.  Neck: No JVD present.  Cardiovascular: Normal rate, regular rhythm and normal heart sounds.   No murmur heard. Pulmonary/Chest: He has decreased breath sounds. He has no wheezes. He has no rales.  Abdominal: Soft. There is no tenderness.  Musculoskeletal: He exhibits edema.  2+ bilat LE edema   Neurological: He is alert and oriented to person, place, and time.  Skin: Skin is warm and dry.  Psychiatric: He has a normal mood and affect.    ASSESSMENT:    1. Essential hypertension   2. Bilateral leg edema   3. Abdominal pain, unspecified abdominal location    PLAN:    In order of problems listed above:  1. Lower extremity edema that is multifactorial secondary to venous insufficiency and low albumin level. We will increase his Lasix from 20 mg daily to 20 mg in the morning and 20 in the early afternoon. There is not enough room for further increase in diuretics as he is rather hypotensive and has prior history of syncope. - We will follow the patient in 4 weeks with BMP at that time.  2. Syncope - Etiology not clear.  Suspect vaso-vagal as he did have pneumonia and was on Lasix at that the time.  30 day event monitor didn't show any arrhythmias or pauses.   3. Thoracic aortic aneurysm - Continue beta-blocker.  Arrange FU CTA in 6 mos. Refer to TCTS if any larger. No AI on echo or exam.  4. Epigastric pain resolving with food - we will refer to [facility] per patient wishes for further evaluation. He has never had an EGD and doesn't remember last colonoscopy.  5. HTN - Controlled.  6. CLL - Followed by Oncology.   Medication Adjustments/Labs and Tests Ordered: Current medicines are reviewed at length with the patient today.  Concerns regarding medicines are outlined above.  Medication changes, Labs and Tests ordered today are outlined in the Patient Instructions noted below. Patient Instructions  Medication Instructions:   INCREASE YOUR LASIX (FUROSEMIDE) TO 20 MG TWICE DAILY---TAKE YOUR 1ST DOSE AT 8 AM AND THEN TAKE YOUR 2ND DOSE AT 2 PM EVERYDAY     You have been referred to [place]      Follow-Up:  4 WEEKS WITH AN EXTENDER IN OUR OFFICE       If you need a refill on your cardiac medications before your next appointment, please call your pharmacy.   Signed, Ena Dawley, MD  06/11/2016 11:17 PM    [facility] [address], [place], [place]  [address] Phone: [phone]; Fax: [phone]

## 2016-06-11 NOTE — Patient Instructions (Signed)
Medication Instructions:   INCREASE YOUR LASIX (FUROSEMIDE) TO 20 MG TWICE DAILY---TAKE YOUR 1ST DOSE AT 8 AM AND THEN TAKE YOUR 2ND DOSE AT 2 PM EVERYDAY     You have been referred to [place]      Follow-Up:  4 WEEKS WITH AN EXTENDER IN OUR OFFICE       If you need a refill on your cardiac medications before your next appointment, please call your pharmacy.

## 2016-06-13 DIAGNOSIS — C672 Malignant neoplasm of lateral wall of bladder: Secondary | ICD-10-CM | POA: Diagnosis not present

## 2016-06-13 DIAGNOSIS — C679 Malignant neoplasm of bladder, unspecified: Secondary | ICD-10-CM | POA: Diagnosis not present

## 2016-06-18 DIAGNOSIS — Z85828 Personal history of other malignant neoplasm of skin: Secondary | ICD-10-CM | POA: Diagnosis not present

## 2016-06-18 DIAGNOSIS — L570 Actinic keratosis: Secondary | ICD-10-CM | POA: Diagnosis not present

## 2016-06-18 DIAGNOSIS — L309 Dermatitis, unspecified: Secondary | ICD-10-CM | POA: Diagnosis not present

## 2016-06-24 ENCOUNTER — Encounter: Payer: Self-pay | Admitting: Physical Therapy

## 2016-06-26 ENCOUNTER — Encounter: Payer: Self-pay | Admitting: Physical Therapy

## 2016-07-01 DIAGNOSIS — R634 Abnormal weight loss: Secondary | ICD-10-CM | POA: Diagnosis not present

## 2016-07-01 DIAGNOSIS — K5900 Constipation, unspecified: Secondary | ICD-10-CM | POA: Diagnosis not present

## 2016-07-01 DIAGNOSIS — R1084 Generalized abdominal pain: Secondary | ICD-10-CM | POA: Diagnosis not present

## 2016-07-09 ENCOUNTER — Ambulatory Visit: Payer: Self-pay | Admitting: Cardiology

## 2016-07-15 ENCOUNTER — Other Ambulatory Visit: Payer: Self-pay | Admitting: Internal Medicine

## 2016-07-16 DIAGNOSIS — C672 Malignant neoplasm of lateral wall of bladder: Secondary | ICD-10-CM | POA: Diagnosis not present

## 2016-07-17 DIAGNOSIS — H35362 Drusen (degenerative) of macula, left eye: Secondary | ICD-10-CM | POA: Diagnosis not present

## 2016-07-17 DIAGNOSIS — H353212 Exudative age-related macular degeneration, right eye, with inactive choroidal neovascularization: Secondary | ICD-10-CM | POA: Diagnosis not present

## 2016-07-17 DIAGNOSIS — H353133 Nonexudative age-related macular degeneration, bilateral, advanced atrophic without subfoveal involvement: Secondary | ICD-10-CM | POA: Diagnosis not present

## 2016-07-17 DIAGNOSIS — H35351 Cystoid macular degeneration, right eye: Secondary | ICD-10-CM | POA: Diagnosis not present

## 2016-07-22 ENCOUNTER — Encounter (HOSPITAL_COMMUNITY): Payer: Self-pay | Admitting: *Deleted

## 2016-07-22 ENCOUNTER — Other Ambulatory Visit: Payer: Self-pay | Admitting: Gastroenterology

## 2016-07-22 NOTE — Progress Notes (Addendum)
Mr Timothy Kim denies chest pain or shortness of breath.  Mr Timothy Kim reports that he has not had a syncope spell since 03/16/16.  Mr Timothy Kim is not taking potassium- "I haven't taken it in a couple months, I read where that too much is not good for you."  Patient saw Dr Timothy Kim, his cardiologist 06/11/16, he did not tell her that is is not taking it.  Patient is taking Lasix 1 time a day instead of 2 that Dr Timothy Kim had increased it to . Dr Timothy Kim not states that she wanted to recheck BMET 4 weeks after office visit, patient was not aware of this. I spoke with Dr Timothy Kim and he asked for a BMP to be drawn pre procedure tomorrow.

## 2016-07-23 ENCOUNTER — Encounter (HOSPITAL_COMMUNITY)
Admission: RE | Disposition: A | Discharge status: Home / Self Care | Payer: Self-pay | Source: Ambulatory Visit | Attending: Gastroenterology

## 2016-07-23 ENCOUNTER — Ambulatory Visit (HOSPITAL_COMMUNITY): Payer: Medicare Other | Admitting: Certified Registered Nurse Anesthetist

## 2016-07-23 ENCOUNTER — Ambulatory Visit (HOSPITAL_COMMUNITY)
Admission: RE | Admit: 2016-07-23 | Discharge: 2016-07-23 | Disposition: A | Discharge status: Home / Self Care | Payer: Medicare Other | Source: Ambulatory Visit | Attending: Gastroenterology | Admitting: Gastroenterology

## 2016-07-23 ENCOUNTER — Encounter (HOSPITAL_COMMUNITY): Payer: Self-pay | Admitting: Certified Registered Nurse Anesthetist

## 2016-07-23 DIAGNOSIS — Z79899 Other long term (current) drug therapy: Secondary | ICD-10-CM | POA: Diagnosis not present

## 2016-07-23 DIAGNOSIS — K449 Diaphragmatic hernia without obstruction or gangrene: Secondary | ICD-10-CM | POA: Diagnosis not present

## 2016-07-23 DIAGNOSIS — K640 First degree hemorrhoids: Secondary | ICD-10-CM | POA: Insufficient documentation

## 2016-07-23 DIAGNOSIS — K222 Esophageal obstruction: Secondary | ICD-10-CM | POA: Diagnosis not present

## 2016-07-23 DIAGNOSIS — Z6820 Body mass index (BMI) 20.0-20.9, adult: Secondary | ICD-10-CM | POA: Diagnosis not present

## 2016-07-23 DIAGNOSIS — K5900 Constipation, unspecified: Secondary | ICD-10-CM | POA: Diagnosis not present

## 2016-07-23 DIAGNOSIS — K269 Duodenal ulcer, unspecified as acute or chronic, without hemorrhage or perforation: Secondary | ICD-10-CM | POA: Insufficient documentation

## 2016-07-23 DIAGNOSIS — R634 Abnormal weight loss: Secondary | ICD-10-CM | POA: Insufficient documentation

## 2016-07-23 DIAGNOSIS — R109 Unspecified abdominal pain: Secondary | ICD-10-CM | POA: Diagnosis not present

## 2016-07-23 DIAGNOSIS — Z923 Personal history of irradiation: Secondary | ICD-10-CM | POA: Insufficient documentation

## 2016-07-23 DIAGNOSIS — I10 Essential (primary) hypertension: Secondary | ICD-10-CM | POA: Diagnosis not present

## 2016-07-23 DIAGNOSIS — R1084 Generalized abdominal pain: Secondary | ICD-10-CM | POA: Diagnosis present

## 2016-07-23 DIAGNOSIS — K219 Gastro-esophageal reflux disease without esophagitis: Secondary | ICD-10-CM | POA: Diagnosis not present

## 2016-07-23 DIAGNOSIS — K5730 Diverticulosis of large intestine without perforation or abscess without bleeding: Secondary | ICD-10-CM | POA: Diagnosis not present

## 2016-07-23 DIAGNOSIS — K2980 Duodenitis without bleeding: Secondary | ICD-10-CM | POA: Insufficient documentation

## 2016-07-23 DIAGNOSIS — E1151 Type 2 diabetes mellitus with diabetic peripheral angiopathy without gangrene: Secondary | ICD-10-CM | POA: Diagnosis not present

## 2016-07-23 DIAGNOSIS — Q439 Congenital malformation of intestine, unspecified: Secondary | ICD-10-CM | POA: Diagnosis not present

## 2016-07-23 DIAGNOSIS — R1013 Epigastric pain: Secondary | ICD-10-CM | POA: Diagnosis not present

## 2016-07-23 DIAGNOSIS — Z7951 Long term (current) use of inhaled steroids: Secondary | ICD-10-CM | POA: Insufficient documentation

## 2016-07-23 HISTORY — PX: ESOPHAGOGASTRODUODENOSCOPY: SHX5428

## 2016-07-23 HISTORY — PX: COLONOSCOPY: SHX5424

## 2016-07-23 HISTORY — DX: Pneumonia, unspecified organism: J18.9

## 2016-07-23 HISTORY — DX: Personal history of urinary calculi: Z87.442

## 2016-07-23 LAB — BASIC METABOLIC PANEL
Anion gap: 17 — ABNORMAL HIGH (ref 5–15)
BUN: 6 mg/dL (ref 6–20)
CALCIUM: 9.3 mg/dL (ref 8.9–10.3)
CHLORIDE: 93 mmol/L — AB (ref 101–111)
CO2: 26 mmol/L (ref 22–32)
CREATININE: 1.02 mg/dL (ref 0.61–1.24)
GFR calc non Af Amer: >60 mL/min (ref >60)
Glucose, Bld: 75 mg/dL (ref 65–99)
Potassium: 3.6 mmol/L (ref 3.5–5.1)
SODIUM: 136 mmol/L (ref 135–145)

## 2016-07-23 SURGERY — EGD (ESOPHAGOGASTRODUODENOSCOPY)
Anesthesia: Monitor Anesthesia Care

## 2016-07-23 MED ORDER — LACTATED RINGERS IV SOLN
INTRAVENOUS | Status: DC
  Administered 2016-07-23: 1000 mL via INTRAVENOUS

## 2016-07-23 MED ORDER — PROPOFOL 500 MG/50ML IV EMUL
INTRAVENOUS | Status: DC | PRN
  Administered 2016-07-23: 50 ug/kg/min via INTRAVENOUS

## 2016-07-23 MED ORDER — BUTAMBEN-TETRACAINE-BENZOCAINE 2-2-14 % EX AERO
INHALATION_SPRAY | CUTANEOUS | Status: DC | PRN
  Administered 2016-07-23: 1 via TOPICAL

## 2016-07-23 MED ORDER — SODIUM CHLORIDE 0.9 % IV SOLN: INTRAVENOUS | Status: DC

## 2016-07-23 MED ORDER — SODIUM CHLORIDE 0.9 % IV SOLN
INTRAVENOUS | Status: DC
  Administered 2016-07-23: 08:00:00 via INTRAVENOUS

## 2016-07-23 NOTE — Anesthesia Preprocedure Evaluation (Addendum)
Anesthesia Evaluation  Patient identified by MRN, date of birth, ID band Patient awake    Reviewed: Allergy & Precautions, NPO status , Patient's Chart, lab work & pertinent test results  Airway Mallampati: II  TM Distance: >3 FB Neck ROM: Full    Dental  (+) Teeth Intact, Dental Advisory Given   Pulmonary neg pulmonary ROS,    Pulmonary exam normal        Cardiovascular hypertension, Pt. on medications and Pt. on home beta blockers + Peripheral Vascular Disease (4.6cm thoracic ascending aneurysm)  Normal cardiovascular exam     Neuro/Psych negative neurological ROS     GI/Hepatic GERD  Controlled,Head and neck CA s/p radical neck dissection and radiation.   Endo/Other  diabetes  Renal/GU negative Renal ROS     Musculoskeletal  (+) Arthritis , Osteoarthritis,    Abdominal   Peds  Hematology  (+) anemia ,   Anesthesia Other Findings   Reproductive/Obstetrics                          Lab Results  Component Value Date   WBC 10.3 04/14/2016   HGB 12.4 (L) 04/14/2016   HCT 36.5 (L) 04/14/2016   MCV 109.9 (H) 04/14/2016   PLT 182.0 04/14/2016   Lab Results  Component Value Date   CREATININE 0.86 04/14/2016   BUN 8 04/14/2016   NA 136 04/14/2016   K 4.8 04/14/2016   CL 96 04/14/2016   CO2 32 04/14/2016    Anesthesia Physical Anesthesia Plan  ASA: III  Anesthesia Plan: MAC   Post-op Pain Management:    Induction: Intravenous  Airway Management Planned: Natural Airway and Nasal Cannula  Additional Equipment:   Intra-op Plan:   Post-operative Plan:   Informed Consent: I have reviewed the patients History and Physical, chart, labs and discussed the procedure including the risks, benefits and alternatives for the proposed anesthesia with the patient or authorized representative who has indicated his/her understanding and acceptance.     Plan Discussed with: CRNA,  Anesthesiologist and Surgeon  Anesthesia Plan Comments:       Anesthesia Quick Evaluation

## 2016-07-23 NOTE — Transfer of Care (Signed)
Immediate Anesthesia Transfer of Care Note  Patient: Timothy Kim  Procedure(s) Performed: Procedure(s): ESOPHAGOGASTRODUODENOSCOPY (EGD) (N/A) COLONOSCOPY (N/A)  Patient Location: Endoscopy Unit  Anesthesia Type:MAC  Level of Consciousness: awake, alert , oriented and patient cooperative  Airway & Oxygen Therapy: Patient Spontanous Breathing and Patient connected to nasal cannula oxygen  Post-op Assessment: Report given to RN, Post -op Vital signs reviewed and stable and Patient moving all extremities X 4  Post vital signs: Reviewed and stable  Last Vitals:  Vitals:   07/23/16 0823  BP: 126/68  Pulse: 96  Resp: 13  Temp: 36.8 C    Last Pain:  Vitals:   07/23/16 0823  TempSrc: Oral         Complications: No apparent anesthesia complications

## 2016-07-23 NOTE — Op Note (Signed)
[facility] Patient Name: Timothy Kim Procedure Date : 07/23/2016 MRN: [id number] Attending MD: Lear Ng , MD Date of Birth: April 11, 1930 CSN: [id number] Age: 83 Admit Type: Outpatient Procedure:                Colonoscopy Indications:              This is the patient's first colonoscopy,                            Generalized abdominal pain, Weight loss Providers:                Lear Ng, MD, Cleda Daub, RN, Corliss Parish, Technician Referring MD:              Medicines:                Propofol per Anesthesia, Monitored Anesthesia Care Complications:            No immediate complications. Estimated Blood Loss:     Estimated blood loss: none. Procedure:                Pre-Anesthesia Assessment:                           - Prior to the procedure, a History and Physical                            was performed, and patient medications and                            allergies were reviewed. The patient's tolerance of                            previous anesthesia was also reviewed. The risks                            and benefits of the procedure and the sedation                            options and risks were discussed with the patient.                            All questions were answered, and informed consent                            was obtained. Prior Anticoagulants: The patient has                            taken no previous anticoagulant or antiplatelet                            agents. ASA Grade Assessment: III - A patient with  severe systemic disease. After reviewing the risks                            and benefits, the patient was deemed in                            satisfactory condition to undergo the procedure.                           - Prior to the procedure, a History and Physical                            was performed, and patient medications and            allergies were reviewed. The patient's tolerance of                            previous anesthesia was also reviewed. The risks                            and benefits of the procedure and the sedation                            options and risks were discussed with the patient.                            All questions were answered, and informed consent                            was obtained. Prior Anticoagulants: The patient has                            taken no previous anticoagulant or antiplatelet                            agents. ASA Grade Assessment: III - A patient with                            severe systemic disease. After reviewing the risks                            and benefits, the patient was deemed in                            satisfactory condition to undergo the procedure.                           After obtaining informed consent, the colonoscope                            was passed under direct vision. Throughout the                            procedure, the patient's blood pressure,  pulse, and                            oxygen saturations were monitored continuously. The                            EC-[id number] ([id number]) scope was introduced through                            the anus and advanced to the the cecum, identified                            by appendiceal orifice and ileocecal valve. The                            colonoscopy was somewhat difficult due to multiple                            diverticula in the colon, significant looping, a                            tortuous colon and fair prep. Successful completion                            of the procedure was aided by straightening and                            shortening the scope to obtain bowel loop reduction                            and lavage. The patient tolerated the procedure                            well. The quality of the bowel preparation was                            fair.  The ileocecal valve, appendiceal orifice, and                            rectum were photographed. Scope In: 9:26:40 AM Scope Out: 9:37:34 AM Scope Withdrawal Time: 0 hours 3 minutes 41 seconds  Total Procedure Duration: 0 hours 10 minutes 54 seconds  Findings:      The perianal and digital rectal examinations were normal.      Multiple small and large-mouthed diverticula were found in the sigmoid       colon and descending colon.      Internal hemorrhoids were found during retroflexion. The hemorrhoids       were small and Grade I (internal hemorrhoids that do not prolapse). Impression:               - Preparation of the colon was fair.                           - Diverticulosis in the sigmoid colon and in the  descending colon.                           - Internal hemorrhoids.                           - No specimens collected. Moderate Sedation:      N/A - MAC procedure Recommendation:           - Repeat colonoscopy is not recommended for                            screening purposes.                           - High fiber diet.                           - Continue present medications.                           - Patient has a contact number available for                            emergencies. The signs and symptoms of potential                            delayed complications were discussed with the                            patient. Return to normal activities tomorrow.                            Written discharge instructions were provided to the                            patient. Procedure Code(s):        --- Professional ---                           [phone], Colonoscopy, flexible; diagnostic, including                            collection of specimen(s) by brushing or washing,                            when performed (separate procedure) Diagnosis Code(s):        --- Professional ---                           R10.84, Generalized abdominal pain                            R63.4, Abnormal weight loss                           K64.0, First degree hemorrhoids  K57.30, Diverticulosis of large intestine without                            perforation or abscess without bleeding CPT copyright 2016 [employer]. All rights reserved. The codes documented in this report are preliminary and upon coder review may  be revised to meet current compliance requirements. Lear Ng, MD 07/23/2016 10:01:47 AM This report has been signed electronically. Number of Addenda: 0

## 2016-07-23 NOTE — Op Note (Signed)
[facility] Patient Name: Timothy Kim Procedure Date : 07/23/2016 MRN: [id number] Attending MD: Lear Ng , MD Date of Birth: October 11, 1929 CSN: [id number] Age: 83 Admit Type: Outpatient Procedure:                Upper GI endoscopy Indications:              Generalized abdominal pain, Weight loss Providers:                Lear Ng, MD, Cleda Daub, RN, Corliss Parish, Technician Referring MD:             Marletta Lor, MD Medicines:                Propofol per Anesthesia Complications:            No immediate complications. Estimated Blood Loss:     Estimated blood loss was minimal. Procedure:                Pre-Anesthesia Assessment:                           - Prior to the procedure, a History and Physical                            was performed, and patient medications and                            allergies were reviewed. The patient's tolerance of                            previous anesthesia was also reviewed. The risks                            and benefits of the procedure and the sedation                            options and risks were discussed with the patient.                            All questions were answered, and informed consent                            was obtained. Prior Anticoagulants: The patient has                            taken no previous anticoagulant or antiplatelet                            agents. ASA Grade Assessment: III - A patient with                            severe systemic disease. After reviewing the risks  and benefits, the patient was deemed in                            satisfactory condition to undergo the procedure.                           After obtaining informed consent, the endoscope was                            passed under direct vision. Throughout the                            procedure, the patient's blood pressure,  pulse, and                            oxygen saturations were monitored continuously. The                            EG-[id number] ([id number]) scope was introduced through the                            mouth, and advanced to the second part of duodenum.                            The upper GI endoscopy was accomplished without                            difficulty. The patient tolerated the procedure                            well. Scope In: Scope Out: Findings:      One moderate (circumferential scarring or stenosis; an endoscope may       pass) benign-appearing, intrinsic stenosis was found in the lower third       of the esophagus. The stricture was traversed with mild resistance from       passage of the endoscope causing dilation of the stricture at the GEJ       from passage of the endoscope.      The Z-line was found 38 cm from the incisors.      A medium-sized hiatal hernia was present.      The exam of the stomach was otherwise normal.      One non-bleeding cratered duodenal ulcer with no stigmata of bleeding       was found in the duodenal bulb. The lesion was 8 mm in largest dimension.      Segmental moderate mucosal changes characterized by congestion and       erythema were found in the duodenal bulb. Biopsies were taken with a       cold forceps for histology. Estimated blood loss was minimal.      The second portion of the duodenum was normal. Impression:               - Benign-appearing esophageal stenosis (dilated                            from  passage of the endoscope).                           - Z-line, 38 cm from the incisors.                           - Medium-sized hiatal hernia.                           - One non-bleeding duodenal ulcer with no stigmata                            of bleeding.                           - Mucosal changes in the duodenum. Biopsied.                           - Normal second portion of the duodenum. Moderate Sedation:      N/A - MAC  procedure Recommendation:           - Await pathology results.                           - Use Prilosec (omeprazole) 40 mg PO BID.                           - No aspirin, ibuprofen, naproxen, or other                            non-steroidal anti-inflammatory drugs.                           - Resume previous diet.                           - Patient has a contact number available for                            emergencies. The signs and symptoms of potential                            delayed complications were discussed with the                            patient. Return to normal activities tomorrow.                            Written discharge instructions were provided to the                            patient. Procedure Code(s):        --- Professional ---                           [phone], Esophagogastroduodenoscopy, flexible,  transoral; with biopsy, single or multiple Diagnosis Code(s):        --- Professional ---                           R10.84, Generalized abdominal pain                           K26.9, Duodenal ulcer, unspecified as acute or                            chronic, without hemorrhage or perforation                           K22.2, Esophageal obstruction                           K44.9, Diaphragmatic hernia without obstruction or                            gangrene                           K31.89, Other diseases of stomach and duodenum                           R63.4, Abnormal weight loss CPT copyright 2016 American Medical Association. All rights reserved. The codes documented in this report are preliminary and upon coder review may  be revised to meet current compliance requirements. Lear Ng, MD 07/23/2016 9:57:45 AM This report has been signed electronically. Number of Addenda: 0

## 2016-07-23 NOTE — Interval H&P Note (Signed)
History and Physical Interval Note:  07/23/2016 8:37 AM  Timothy Kim  has presented today for surgery, with the diagnosis of abd pain; weight loss; constipation  The various methods of treatment have been discussed with the patient and family. After consideration of risks, benefits and other options for treatment, the patient has consented to  Procedure(s): ESOPHAGOGASTRODUODENOSCOPY (EGD) (N/A) COLONOSCOPY (N/A) as a surgical intervention .  The patient's history has been reviewed, patient examined, no change in status, stable for surgery.  I have reviewed the patient's chart and labs.  Questions were answered to the patient's satisfaction.     [place] C.

## 2016-07-23 NOTE — Discharge Instructions (Signed)
Colonoscopy, Adult, Care After This sheet gives you information about how to care for yourself after your procedure. Your doctor may also give you more specific instructions. If you have problems or questions, call your doctor. Follow these instructions at home: General instructions  For the first 24 hours after the procedure:  Do not drive or use machinery.  Do not sign important documents.  Do not drink alcohol.  Do your daily activities more slowly than normal.  Eat foods that are soft and easy to digest.  Rest often.  Take over-the-counter or prescription medicines only as told by your doctor.  It is up to you to get the results of your procedure. Ask your doctor, or the department performing the procedure, when your results will be ready. To help cramping and bloating:  Try walking around.  Put heat on your belly (abdomen) as told by your doctor. Use a heat source that your doctor recommends, such as a moist heat pack or a heating pad.  Put a towel between your skin and the heat source.  Leave the heat on for 20-30 minutes.  Remove the heat if your skin turns bright red. This is especially important if you cannot feel pain, heat, or cold. You can get burned. Eating and drinking  Drink enough fluid to keep your pee (urine) clear or pale yellow.  Return to your normal diet as told by your doctor. Avoid heavy or fried foods that are hard to digest.  Avoid drinking alcohol for as long as told by your doctor. Contact a doctor if:  You have blood in your poop (stool) 2-3 days after the procedure. Get help right away if:  You have more than a small amount of blood in your poop.  You see large clumps of tissue (blood clots) in your poop.  Your belly is swollen.  You feel sick to your stomach (nauseous).  You throw up (vomit).  You have a fever.  You have belly pain that gets worse, and medicine does not help your pain. This information is not intended to replace  advice given to you by your health care provider. Make sure you discuss any questions you have with your health care provider. Document Released: 07/12/2010 Document Revised: 03/03/2016 Document Reviewed: 03/03/2016 Elsevier Interactive Patient Education  2017 [place]. Esophagogastroduodenoscopy, Care After Introduction Refer to this sheet in the next few weeks. These instructions provide you with information about caring for yourself after your procedure. Your health care provider may also give you more specific instructions. Your treatment has been planned according to current medical practices, but problems sometimes occur. Call your health care provider if you have any problems or questions after your procedure. What can I expect after the procedure? After the procedure, it is common to have:  A sore throat.  Nausea.  Bloating.  Dizziness.  Fatigue. Follow these instructions at home:  Do not eat or drink anything until the numbing medicine (local anesthetic) has worn off and your gag reflex has returned. You will know that the local anesthetic has worn off when you can swallow comfortably.  Do not drive for 24 hours if you received a medicine to help you relax (sedative).  If your health care provider took a tissue sample for testing during the procedure, make sure to get your test results. This is your responsibility. Ask your health care provider or the department performing the test when your results will be ready.  Keep all follow-up visits as told by  your health care provider. This is important. Contact a health care provider if:  You cannot stop coughing.  You are not urinating.  You are urinating less than usual. Get help right away if:  You have trouble swallowing.  You cannot eat or drink.  You have throat or chest pain that gets worse.  You are dizzy or light-headed.  You faint.  You have nausea or vomiting.  You have chills.  You have a  fever.  You have severe abdominal pain.  You have black, tarry, or bloody stools. This information is not intended to replace advice given to you by your health care provider. Make sure you discuss any questions you have with your health care provider. Document Released: 05/26/2012 Document Revised: 11/15/2015 Document Reviewed: 05/03/2015  2017 Elsevier

## 2016-07-23 NOTE — Anesthesia Postprocedure Evaluation (Signed)
Anesthesia Post Note  Patient: Timothy Kim  Procedure(s) Performed: Procedure(s) (LRB): ESOPHAGOGASTRODUODENOSCOPY (EGD) (N/A) COLONOSCOPY (N/A)  Patient location during evaluation: PACU Anesthesia Type: MAC Level of consciousness: awake and alert Pain management: pain level controlled Vital Signs Assessment: post-procedure vital signs reviewed and stable Respiratory status: spontaneous breathing, nonlabored ventilation, respiratory function stable and patient connected to nasal cannula oxygen Cardiovascular status: stable and blood pressure returned to baseline Anesthetic complications: no       Last Vitals:  Vitals:   07/23/16 1020 07/23/16 1028  BP: 140/76 (!) 147/72  Pulse: 93 93  Resp: 16 14  Temp:      Last Pain:  Vitals:   07/23/16 0950  TempSrc: Oral                 Tiajuana Amass

## 2016-07-23 NOTE — H&P (Signed)
Date of Initial H&P: 07/01/16  History reviewed, patient examined, no change in status, stable for surgery.

## 2016-07-23 NOTE — Anesthesia Procedure Notes (Signed)
Procedure Name: MAC Date/Time: 07/23/2016 9:02 AM Performed by: Carney Living Pre-anesthesia Checklist: Patient identified, Emergency Drugs available, Suction available, Patient being monitored and Timeout performed Patient Re-evaluated:Patient Re-evaluated prior to inductionOxygen Delivery Method: Nasal cannula

## 2016-07-24 ENCOUNTER — Encounter (HOSPITAL_COMMUNITY): Payer: Self-pay | Admitting: Gastroenterology

## 2016-07-24 LAB — H. PYLORI ANTIBODY, IGG: H Pylori IgG: <0.8 Index Value (ref 0.00–0.79)

## 2016-07-25 ENCOUNTER — Encounter: Payer: Self-pay | Admitting: Cardiology

## 2016-07-25 ENCOUNTER — Ambulatory Visit (INDEPENDENT_AMBULATORY_CARE_PROVIDER_SITE_OTHER): Payer: Medicare Other | Admitting: Cardiology

## 2016-07-25 DIAGNOSIS — R109 Unspecified abdominal pain: Secondary | ICD-10-CM

## 2016-07-25 DIAGNOSIS — R600 Localized edema: Secondary | ICD-10-CM

## 2016-07-25 DIAGNOSIS — I10 Essential (primary) hypertension: Secondary | ICD-10-CM

## 2016-07-25 NOTE — Progress Notes (Signed)
07/25/2016 Timothy Kim   1929/11/02  [id number]  Primary Physician [name], MD Primary Cardiologist: Dr. Meda Coffee    Reason for Visit/CC: Lower Extremity Edema.   HPI:  83 y/o male, followed by Dr. Meda Coffee, who presents to clinic for 4 week f/u for LEE.    He has a h/o CLL, oral CA, bladder CA, HTN, DM, LE edema. He was first referred to Dr. Meda Coffee 02/2016 after being admitted for syncope. Chest CT was suggestive of community-acquired pneumonia. He was tx with antibiotics. Echo demonstrated normal EF. CTA did show 4.6 cm ascending thoracic aortic aneurysm. Head CT was non-acute. Troponin levels were minimally elevated without any trend (0.03 - 0.03 - 0.03). The DC summary indicates that Tele demonstrated bradycardia and pauses.His beta-blocker was held then reduced. Dr. Meda Coffee ordered for him to be evaluated with a 30 day monitor. This did not show any arrhthymias or pauses.  The etiology of his syncope was unclear. It was felt that perhaps he had vaso-vagal syncope repeated to his PNA and use of diuretics at that time (was on lasix).   He was seen in f/u by Dr. Meda Coffee on 06/11/16. He was doing well w/o recurrent syncope. He did however complain of bilateral LEE that had been chronic despite taking lasix.  In addition, he has difficulty eating due to a problem with his jaw (h/o oral cancer). He drinks protein replacement drinks. Dr. Meda Coffee felt that his LEE was multifactorial secondary to venous insufficiency and low albumin level. She elected to increase his Lasix from 20 mg daily to 20 mg BID.  He now presents back for f/u. He continues to have significant edema. He has noticed any improvement with increased dose of diuretic. However he feels that his edema is not any worse. He continues to have difficulties eating given his strong problem. He tries to take in as much protein as possible through protein shakes.  In clinic today his blood pressure is low. Systolic  blood pressure is in the upper 80s however he is asymptomatic. He reports that his blood pressure has been running on the lower side here recently. He denies any dizziness, lightheadedness, fatigue, dyspnea, chest pain, syncope/near-syncope.   Active Medications  No outpatient prescriptions have been marked as taking for the 07/25/16 encounter (Office Visit) with [name], PA-C.     No Known Allergies     Past Medical History:  Diagnosis Date  . Alcohol abuse   . Allergic rhinitis   . Anemia in neoplastic disease    chronic  . Bilateral lower extremity edema    feet  . Borderline diabetes   . BPH (benign prostatic hypertrophy)   . CLL (chronic lymphocytic leukemia) [facility]) oncologist-  dr Heath Lark ([facility])   dx Jan 2014 --- Stage 1--  currently asymptomatic as of Apr 2016 and No active disease  . DDD (degenerative disc disease), cervical   . DDD (degenerative disc disease), lumbosacral   . 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 Dysphasia functional--  speech therapy   post op  radical neck dissection 07-21-2014--  changed diet to no chewy food , softer foods , states with the changes swallowed okay without any issues  . Glaucoma    both eyes  . History of gastroesophageal reflux (GERD)   . History  of kidney stones    right side--  per CT from [facility] 01-17-2015 non-obstructing  . History of radiation therapy    09-04-2014 to 10-13-2014  Right retromolar region (tumor bed) and right neck/  60Gy in 30 fractions to tumor bed and 54Gy in 30 fractions to neck  . History of radiation to head and neck region 09-04-2014 to  10-13-2014   right retromolar region and right neck  60 Gy  . History of syncope    a. Event monitor 11/17: No evidence of  significant AVB or > 3 second pause.; plan ILR if recurrent syncope in the future  . History of tracheostomy    post op radical neck dissection  07-21-2014--  post op tracheostomy on 07-25-2014  decannulated and sutured 07-29-2014  . Hypertension   . Macular degeneration    right eye only  . MRSA (methicillin resistant staph aureus) culture positive   . OA (osteoarthritis)   . Organic impotence   . Pneumonia 12/2015  . Pulmonary nodule    benign  and stable per last CT  . Radiation-induced dermatitis   . 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) oncologist-  dr Isidore Moos   Stage IVB, pT1b, pN0, Grade 2, +PNI, no LVSI---  S/P  RADICAL NECK DISSECTION AND RADIATION  . Thoracic ascending aortic aneurysm ([place]) 03/28/2016   Chest CTA 9/17: 4.6 cm fusiform ascending thoracic aortic aneurysm >> Plan repeat CTA in 3/18  . 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 Transitional cell carcinoma of bladder [facility]) urologist-- dr Junious Silk   High - grade  . Venous insufficiency          Family History  Problem Relation Age of Onset  . Cancer Mother     lung ca  . Alcohol abuse Sister   . Hypertension Neg Hx     family hx  . Sudden death Neg Hx     famiylhx  . Heart attack Neg Hx         Past Surgical History:  Procedure Laterality Date  . CATARACT EXTRACTION W/ INTRAOCULAR LENS  IMPLANT, BILATERAL    . COLONOSCOPY N/A 07/23/2016   Procedure: COLONOSCOPY;  Surgeon: Wilford Corner, MD;  Location: [facility] ENDOSCOPY;  Service: Endoscopy;  Laterality: N/A;  . CYSTOSCOPY N/A 02/02/2015   Procedure: CYSTOSCOPY, INSTILLATION OF MITOMYCIN C;  Surgeon: Lowella Bandy, MD;  Location: [facility];  Service: Urology;  Laterality: N/A;  . CYSTOSCOPY WITH BIOPSY N/A 03/27/2015   Procedure: CYSTOSCOPY WITH BLADDER  BIOPSY WITH FULGERATION;  Surgeon: Festus Aloe, MD;  Location: [facility];  Service:  Urology;  Laterality: N/A;  . ESOPHAGOGASTRODUODENOSCOPY N/A 07/23/2016   Procedure: ESOPHAGOGASTRODUODENOSCOPY (EGD);  Surgeon: Wilford Corner, MD;  Location: [facility] ENDOSCOPY;  Service: Endoscopy;  Laterality: N/A;  . INGUINAL HERNIA REPAIR Bilateral right 1988//  left 1970's  . LAMINECTOMY WITH POSTERIOR LATERAL ARTHRODESIS LEVEL 1  09-01-2003   L3 -5 LAMINECTOMY W/ DECOMPRESSION AND FUSION L4-5  . MAXILLECTOMY Right 07/21/14,   [facility]   Radical Neck Dissection, Maxillectomy, Right Marginal Mandibulectomy, dental extractions, Parascauplar fasciocutaeous Free flap reconstruction  . RETINAL DETACHMENT REPAIR W/ SCLERAL BUCKLE LE  11-03-2000  . ROTATOR CUFF REPAIR Right 12-11-2000  . tonsil biopsy Right 05/24/14  . TRANSURETHRAL RESECTION OF BLADDER TUMOR N/A 02/02/2015   Procedure: TRANSURETHRAL RESECTION OF BLADDER TUMOR (TURBT);  Surgeon: Lowella Bandy, MD;  Location: [facility];  Service:  Urology;  Laterality: N/A;   Social History        Social History  . Marital status: Single    Spouse name: N/A  . Number of children: 2  . Years of education: N/A      Occupational History  . Not on file.         Social History Main Topics  . Smoking status: Never Smoker  . Smokeless tobacco: Never Used  . Alcohol use Yes     Comment: vodka 3-4 daily   (Hx alcohol withdrawal )  . Drug use: No  . Sexual activity: Not on file       Other Topics Concern  . Not on file      Social History Narrative   Patient is divorced with 2 children.   Patient has never smoked. Patient has never used smokeless tobacco.    Patient drinks gin or vodka on a daily basis.   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  Cardiovascular: negative for chest pain, dyspnea on exertion, edema, orthopnea,  palpitations, paroxysmal nocturnal dyspnea or shortness of breath Dermatological: negative for rash Respiratory: negative for cough or wheezing Urologic: negative for hematuria Abdominal: negative for nausea, vomiting, diarrhea, bright red blood per rectum, melena, or hematemesis Neurologic: negative for visual changes, syncope, or dizziness All other systems reviewed and are otherwise negative except as noted above.   Physical Exam:  Height 5\' 8"  (1.727 m).  General appearance: alert, cooperative and no distress Neck: no carotid bruit and no JVD Lungs: clear to auscultation bilaterally Heart: regular rate and rhythm, S1, S2 normal, no murmur, click, rub or gallop Extremities: 2+ bilateral LEE pitting edema (chronic) Pulses: 2+ and symmetric Skin: Skin color, texture, turgor normal. No rashes or lesions Neurologic: Grossly normal  EKG not performed   ASSESSMENT AND PLAN:   1. LEE: felt to be multifactorial from chronic venous insufficiency and low albumin from low protein intake. 2D echo 02/2016 showed normal LVEF.  His lasix was increased at his last OV 12/20 to 20 mg BID. Despite the increase, he has had no improvement with his edema. He is hypotensive but asymptomatic. We will reduce Lasix to 20 mg once daily as risk of hypotension is > than benifit. We discussed having him elevate his legs while at home, above the level of the heart. He was also instructed to restart daily use of his compression stockings to help with edema. I reviewed recent labs from 2 days ago, 1/31. BMP showed normal renal function and K.   2. Hypotension: BP is in the [id number] systolic in clinic today. He notes he was in the 80s at home the other day. He is asymptomatic. No dizziness, syncope/ near syncope. He has had no improvement with increased dose of Lasix. We will reduce lasix back down to 20 mg once daily. Pt advised to wear his compression stockings and stay well hydrated. He will contract [place] if any  continued hypotension at home or development of symptoms.   3. Syncope: ? Etiology. Felt to be possibly vaso-vagal syncope from his PNA and diuertic use. W/u at [facility] was unremarkable. 30 day monitor showed no arrthymias or pauses. He denies any further recurrence. He was advised to start back wearing compression stockings.   PLAN  F/u with Dr. Meda Coffee in 6 months   [facility] PA-C 07/25/2016 10:15 AM

## 2016-07-25 NOTE — Patient Instructions (Addendum)
Your physician has recommended you make the following change in your medication: DECREASE  FUROSEMIDE  TO  [place] physician wants you to follow-up in:6  MONTHS   WITH  DR   Johann Capers will receive a reminder letter in the mail two months in advance. If you don't receive a letter, please call our office to schedule the follow-up appointment.

## 2016-07-25 NOTE — Progress Notes (Signed)
07/25/2016 Timothy Kim   14-Dec-1929  [id number]  Primary Physician Timothy Cowden, MD Primary Cardiologist: Dr. Meda Kim    Reason for Visit/CC: Lower Extremity Edema.   HPI:  83 y/o male, followed by Dr. Meda Kim, who presents to clinic for 4 week f/u for LEE.    He has a h/o CLL, oral CA, bladder CA, HTN, DM, LE edema. He was first referred to Dr. Meda Kim 02/2016 after being admitted for syncope. Chest CT was suggestive of community-acquired pneumonia.  He was tx with antibiotics.  Echo demonstrated normal EF. CTA did show 4.6 cm ascending thoracic aortic aneurysm. Head CT was non-acute.  Troponin levels were minimally elevated without any trend (0.03 - 0.03 - 0.03).  The DC summary indicates that Tele demonstrated bradycardia and pauses. His beta-blocker was held then reduced. Dr. Meda Kim ordered for him to be evaluated with a 30 day monitor. This did not show any arrhthymias or pauses.  The etiology of his syncope was unclear. It was felt that perhaps he had vaso-vagal syncope repeated to his PNA and use of diuretics at that time (was on lasix).   He was seen in f/u by Dr. Meda Kim on 06/11/16. He was doing well w/o recurrent syncope. He did however complain of bilateral LEE that had been chronic despite taking lasix.  In addition, he has difficulty eating due to a problem with his jaw (h/o oral cancer). He drinks protein replacement drinks. Dr. Meda Kim felt that his LEE was multifactorial secondary to venous insufficiency and low albumin level. She elected to increase his Lasix from 20 mg daily to 20 mg BID.  He now presents back for f/u. He continues to have significant edema. He has noticed any improvement with increased dose of diuretic. However he feels that his edema is not any worse. He continues to have difficulties eating given his strong problem. He tries to take in as much protein as possible through protein shakes.  In clinic today his blood pressure is low. Systolic blood  pressure is in the upper 80s however he is asymptomatic. He reports that his blood pressure has been running on the lower side here recently. He denies any dizziness, lightheadedness, fatigue, dyspnea, chest pain, syncope/near-syncope.   No outpatient prescriptions have been marked as taking for the 07/25/16 encounter (Office Visit) with Timothy Pandy, PA-C.   No Known Allergies Past Medical History:  Diagnosis Date  . Alcohol abuse   . Allergic rhinitis   . Anemia in neoplastic disease    chronic  . Bilateral lower extremity edema    feet  . Borderline diabetes   . BPH (benign prostatic hypertrophy)   . CLL (chronic lymphocytic leukemia) [facility]) oncologist-  dr Timothy Kim ([facility])   dx Jan 2014 --- Stage 1--  currently asymptomatic as of Apr 2016 and No active disease  . DDD (degenerative disc disease), cervical   . DDD (degenerative disc disease), lumbosacral   . 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 Dysphasia functional--  speech therapy   post op  radical neck dissection 07-21-2014--  changed diet to no chewy food , softer foods , states with the changes swallowed okay without any issues  . Glaucoma    both eyes  . History of gastroesophageal reflux (GERD)   . History of kidney stones  right side--  per CT from [facility] 01-17-2015 non-obstructing  . History of radiation therapy    09-04-2014 to 10-13-2014  Right retromolar region (tumor bed) and right neck/  60Gy in 30 fractions to tumor bed and 54Gy in 30 fractions to neck  . History of radiation to head and neck region 09-04-2014 to  10-13-2014   right retromolar region and right neck  60 Gy  . History of syncope    a. Event monitor 11/17: No evidence of significant AVB or > 3 second pause.; plan ILR if recurrent syncope in  the future  . History of tracheostomy    post op radical neck dissection  07-21-2014--  post op tracheostomy on 07-25-2014  decannulated and sutured 07-29-2014  . Hypertension   . Macular degeneration    right eye only  . MRSA (methicillin resistant staph aureus) culture positive   . OA (osteoarthritis)   . Organic impotence   . Pneumonia 12/2015  . Pulmonary nodule    benign  and stable per last CT  . Radiation-induced dermatitis   . 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 Thoracic ascending aortic aneurysm (Timothy Kim) 03/28/2016   Chest CTA 9/17: 4.6 cm fusiform ascending thoracic aortic aneurysm >> Plan repeat CTA in 3/18  . 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 Venous insufficiency    Family History  Problem Relation Age of Onset  . Cancer Mother     lung ca  . Alcohol abuse Sister   . Hypertension Neg Hx     family hx  . Sudden death Neg Hx     famiylhx  . Heart attack Neg Hx    Past Surgical History:  Procedure Laterality Date  . CATARACT EXTRACTION W/ INTRAOCULAR LENS  IMPLANT, BILATERAL    . COLONOSCOPY N/A 07/23/2016   Procedure: COLONOSCOPY;  Surgeon: Timothy Corner, MD;  Location[facility] ENDOSCOPY;  Service: Endoscopy;  Laterality: N/A;  . CYSTOSCOPY N/A 02/02/2015   Procedure: CYSTOSCOPY, INSTILLATION OF MITOMYCIN C;  Surgeon: Timothy Bandy, MD;  Location: [facility];  Service: Urology;  Laterality: N/A;  . CYSTOSCOPY WITH BIOPSY N/A 03/27/2015   Procedure: CYSTOSCOPY WITH BLADDER  BIOPSY WITH FULGERATION;  Surgeon: Timothy Aloe, MD;  Location: [facility];  Service: Urology;  Laterality: N/A;  . ESOPHAGOGASTRODUODENOSCOPY N/A 07/23/2016   Procedure: ESOPHAGOGASTRODUODENOSCOPY (EGD);   Surgeon: Timothy Corner, MD;  Location: [facility] ENDOSCOPY;  Service: Endoscopy;  Laterality: N/A;  . INGUINAL HERNIA REPAIR Bilateral right 1988//  left 1970's  . LAMINECTOMY WITH POSTERIOR LATERAL ARTHRODESIS LEVEL 1  09-01-2003   L3 -5 LAMINECTOMY W/ DECOMPRESSION AND FUSION L4-5  . MAXILLECTOMY Right 07/21/14,   [facility]   Radical Neck Dissection, Maxillectomy, Right Marginal Mandibulectomy, dental extractions, Parascauplar fasciocutaeous Free flap reconstruction  . RETINAL DETACHMENT REPAIR W/ SCLERAL BUCKLE LE  11-03-2000  . ROTATOR CUFF REPAIR Right 12-11-2000  . tonsil biopsy Right 05/24/14  . TRANSURETHRAL RESECTION OF BLADDER TUMOR N/A 02/02/2015   Procedure: TRANSURETHRAL RESECTION OF BLADDER TUMOR (TURBT);  Surgeon: Timothy Bandy, MD;  Location: [facility];  Service: Urology;  Laterality: N/A;   Social History   Social History  . Marital status: Single  Spouse name: N/A  . Number of children: 2  . Years of education: N/A   Occupational History  . Not on file.   Social History Main Topics  . Smoking status: Never Smoker  . Smokeless tobacco: Never Used  . Alcohol use Yes     Comment: vodka 3-4 daily   (Hx alcohol withdrawal )  . Drug use: No  . Sexual activity: Not on file   Other Topics Concern  . Not on file   Social History Narrative   Patient is divorced with 2 children.   Patient has never smoked. Patient has never used smokeless tobacco.    Patient drinks gin or vodka on a daily basis.   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  Cardiovascular: negative for chest pain, dyspnea on exertion, edema, orthopnea, palpitations, paroxysmal nocturnal dyspnea or shortness of breath Dermatological: negative for rash Respiratory: negative for cough or wheezing Urologic: negative for  hematuria Abdominal: negative for nausea, vomiting, diarrhea, bright red blood per rectum, melena, or hematemesis Neurologic: negative for visual changes, syncope, or dizziness All other systems reviewed and are otherwise negative except as noted above.   Physical Exam:  Height 5\' 8"  (1.727 m).  General appearance: alert, cooperative and no distress Neck: no carotid bruit and no JVD Lungs: clear to auscultation bilaterally Heart: regular rate and rhythm, S1, S2 normal, no murmur, click, rub or gallop Extremities: 2+ bilateral LEE pitting edema (chronic) Pulses: 2+ and symmetric Skin: Skin color, texture, turgor normal. No rashes or lesions Neurologic: Grossly normal  EKG not performed   ASSESSMENT AND PLAN:   1. LEE: felt to be multifactorial from chronic venous insufficiency and low albumin from low protein intake. 2D echo 02/2016 showed normal LVEF.  His lasix was increased at his last OV 12/20 to 20 mg BID. Despite the increase, he has had no improvement with his edema. He is hypotensive but asymptomatic. We will reduce Lasix to 20 mg once daily as risk of hypotension is > than benifit. We discussed having him elevate his legs while at home, above the level of the heart. He was also instructed to restart daily use of his compression stockings to help with edema. I reviewed recent labs from 2 days ago, 1/31. BMP showed normal renal function and K.   2. Hypotension: BP is in the [id number] systolic in clinic today. He notes he was in the 80s at home the other day. He is asymptomatic. No dizziness, syncope/ near syncope. He has had no improvement with increased dose of Lasix. We will reduce lasix back down to 20 mg once daily. Pt advised to wear his compression stockings and stay well hydrated. He will contract [place] if any continued hypotension at home or development of symptoms.   3. Syncope: ? Etiology. Felt to be possibly vaso-vagal syncope from his PNA and diuertic use. W/u at [facility] was  unremarkable. 30 day monitor showed no arrthymias or pauses. He denies any further recurrence. He was advised to start back wearing compression stockings.   PLAN  F/u with Dr. Meda Kim in 6 months   Cornell Bourbon [facility] PA-C 07/25/2016 10:15 AM

## 2016-08-21 ENCOUNTER — Other Ambulatory Visit: Payer: Self-pay | Admitting: Internal Medicine

## 2016-09-19 ENCOUNTER — Telehealth: Payer: Self-pay | Admitting: *Deleted

## 2016-09-19 ENCOUNTER — Other Ambulatory Visit: Payer: Medicare Other | Admitting: *Deleted

## 2016-09-19 DIAGNOSIS — I10 Essential (primary) hypertension: Secondary | ICD-10-CM | POA: Diagnosis not present

## 2016-09-19 LAB — BASIC METABOLIC PANEL
BUN/Creatinine Ratio: 20 (ref 10–24)
BUN: 15 mg/dL (ref 8–27)
CALCIUM: 9.3 mg/dL (ref 8.6–10.2)
CHLORIDE: 87 mmol/L — AB (ref 96–106)
CO2: 30 mmol/L — AB (ref 18–29)
Creatinine, Ser: 0.75 mg/dL — ABNORMAL LOW (ref 0.76–1.27)
GFR calc Af Amer: 97 mL/min/{1.73_m2} (ref >59)
GFR, EST NON AFRICAN AMERICAN: 84 mL/min/{1.73_m2} (ref >59)
GLUCOSE: 107 mg/dL — AB (ref 65–99)
POTASSIUM: 3.8 mmol/L (ref 3.5–5.2)
Sodium: 135 mmol/L (ref 134–144)

## 2016-09-19 NOTE — Addendum Note (Signed)
Addended by: Eulis Foster on: 09/19/2016 10:13 AM   Modules accepted: Orders

## 2016-09-19 NOTE — Telephone Encounter (Signed)
Pt notified of lab results by phone with verbal understanding. Pt thanked me for my call tonight.

## 2016-09-19 NOTE — Telephone Encounter (Signed)
-----   Message from Liliane Shi, [place] sent at 09/19/2016  4:50 PM EDT ----- Please call patient: The kidney function (BUN, Creatinine) and potassium are normal. All other parameters are within acceptable limits and no further intervention or testing required. Continue with current treatment plan. Richardson Dopp, PA-C   09/19/2016 4:49 PM

## 2016-09-26 ENCOUNTER — Telehealth: Payer: Self-pay | Admitting: *Deleted

## 2016-09-26 ENCOUNTER — Ambulatory Visit (INDEPENDENT_AMBULATORY_CARE_PROVIDER_SITE_OTHER)
Admission: RE | Admit: 2016-09-26 | Discharge: 2016-09-26 | Disposition: A | Discharge status: Home / Self Care | Payer: Medicare Other | Source: Ambulatory Visit | Attending: Physician Assistant | Admitting: Physician Assistant

## 2016-09-26 ENCOUNTER — Encounter: Payer: Self-pay | Admitting: Physician Assistant

## 2016-09-26 DIAGNOSIS — I7121 Aneurysm of the ascending aorta, without rupture: Secondary | ICD-10-CM

## 2016-09-26 DIAGNOSIS — I712 Thoracic aortic aneurysm, without rupture: Secondary | ICD-10-CM

## 2016-09-26 MED ORDER — IOPAMIDOL (ISOVUE-370) INJECTION 76%
100.0000 mL | Freq: Once | INTRAVENOUS | Status: AC | PRN
  Administered 2016-09-26: 100 mL via INTRAVENOUS

## 2016-09-26 NOTE — Telephone Encounter (Signed)
-----   Message from Liliane Shi, [place] sent at 09/26/2016  1:54 PM EDT ----- Please call the patient. His ascending thoracic aortic aneurysm is stable without change in size. Continue current Rx. Repeat in 1 year.   Please fax a copy of this study result to his PCP:  [name], MD and his Oncologist. Thanks! Richardson Dopp, PA-C    09/26/2016 1:50 PM

## 2016-09-26 NOTE — Telephone Encounter (Signed)
Pt has been notified of CT-A results by phone with verbal understanding to findings. Pt agreeable to repeat CT-A in 1 year. I will fax results to PCP as well as to Oncologist, Dr. Eppie Gibson and Dr. Heath Lark. Pt thanked me for my time and my call today.

## 2016-10-01 NOTE — Progress Notes (Signed)
  Mr. Creech presents for follow up of radiation completed 10/13/2014 to his Right retromolar region and right neck.   Pain issues, if any: He reports lower back pain when bending over. He has an appointment to see an orthopedist 10/24/16. He has degenerative spinal stenosis.  Using a feeding tube?: no Weight changes, if any:  Wt Readings from Last 3 Encounters:  10/03/16 140 lb (63.5 kg)  07/25/16 138 lb 1.9 oz (62.7 kg)  07/23/16 132 lb (59.9 kg)   Swallowing issues, if any: He is only able to eat soft food items. He is using a protein powder supplement daily.  Smoking or chewing tobacco? No Using fluoride trays daily? He is using fluoride toothpaste.  Last ENT visit was on: Dr. Marijo Conception 47/65/46, Dr. Erik Obey 50/35/46 Other notable issues, if any:  He reports some swelling to the Right side of his jaw which he observed starting about 4-5 months ago.   BP 118/84   Pulse 71   Temp 97.9 F (36.6 C)   Ht 5\' 8"  (1.727 m)   Wt 140 lb (63.5 kg)   SpO2 98% Comment: room air  BMI 21.29 kg/m

## 2016-10-03 ENCOUNTER — Encounter: Payer: Self-pay | Admitting: Radiation Oncology

## 2016-10-03 ENCOUNTER — Encounter: Payer: Self-pay | Admitting: Adult Health

## 2016-10-03 ENCOUNTER — Ambulatory Visit
Admission: RE | Admit: 2016-10-03 | Discharge: 2016-10-03 | Disposition: A | Discharge status: Home / Self Care | Payer: Medicare Other | Source: Ambulatory Visit | Attending: Radiation Oncology | Admitting: Radiation Oncology

## 2016-10-03 VITALS — BP 118/84 | HR 71 | Temp 97.9°F | Ht 68.0 in | Wt 140.0 lb

## 2016-10-03 DIAGNOSIS — Z79899 Other long term (current) drug therapy: Secondary | ICD-10-CM | POA: Diagnosis not present

## 2016-10-03 DIAGNOSIS — B370 Candidal stomatitis: Secondary | ICD-10-CM | POA: Insufficient documentation

## 2016-10-03 DIAGNOSIS — R635 Abnormal weight gain: Secondary | ICD-10-CM

## 2016-10-03 DIAGNOSIS — Z85818 Personal history of malignant neoplasm of other sites of lip, oral cavity, and pharynx: Secondary | ICD-10-CM | POA: Diagnosis not present

## 2016-10-03 DIAGNOSIS — C148 Malignant neoplasm of overlapping sites of lip, oral cavity and pharynx: Secondary | ICD-10-CM | POA: Insufficient documentation

## 2016-10-03 DIAGNOSIS — C062 Malignant neoplasm of retromolar area: Secondary | ICD-10-CM

## 2016-10-03 DIAGNOSIS — Z08 Encounter for follow-up examination after completed treatment for malignant neoplasm: Secondary | ICD-10-CM | POA: Diagnosis not present

## 2016-10-03 DIAGNOSIS — Z1329 Encounter for screening for other suspected endocrine disorder: Secondary | ICD-10-CM

## 2016-10-03 DIAGNOSIS — B379 Candidiasis, unspecified: Secondary | ICD-10-CM | POA: Diagnosis not present

## 2016-10-03 DIAGNOSIS — R5381 Other malaise: Secondary | ICD-10-CM

## 2016-10-03 DIAGNOSIS — R5383 Other fatigue: Secondary | ICD-10-CM

## 2016-10-03 LAB — TSH: TSH: 2.647 m(IU)/L (ref 0.320–4.118)

## 2016-10-03 MED ORDER — FLUCONAZOLE 10 MG/ML PO SUSR: ORAL | 0 refills | Status: DC

## 2016-10-03 NOTE — Progress Notes (Signed)
Radiation Oncology         [phone] ________________________________  Name: Timothy Kim MRN: [id number]  Date: 10/03/2016  DOB: March 25, 1930  Follow-Up Visit Note  CC: [name], MD  Philomena Doheny, MD  Diagnosis and Prior Radiotherapy:       ICD-9-CM ICD-10-CM   1. 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) 145.6 C06.2 fluconazole (DIFLUCAN) 10 MG/ML suspension  2. Cancer of the lip, oral cavity, and pharynx (HCC) 149.8 C14.8      STAGE IVB pT4b pN0 Grade 2 Squamous cell carcinoma, Right Retromolar Trigone; +PNI, no LVSI  Chief Complaint: Follow up of radiation completed to the right retromolar region and right neck    Radiation treatment dates:    09/04/14 - 10/13/14 : Right retromolar region (tumor bed) and right neck treated to  60 Gy in 30 fractions to tumor bed and 54 Gy in 30 fractions to neck  Narrative:  The patient returns today for routine follow-up appointment with radiation oncology.  CT angiogram of the chest on 09/26/16 revealed a stable right lower lobe pulmonary nodule compared to previous scans. In fact, this has been stable since 2004 according to reports and is considered a benign process. CT of the head w/o contrast also on 03/18/16 was negative for acute changes.  On review of systems, the patient reports lower back pain when bending over. He has an appointment to see an orthopedist on 10/24/16 for degenerative spinal stenosis. He has gained 8 lbs in the last 2.5 months. The patient reports he is only able to eat soft foods, and is using a protein powder supplement daily. He reports swelling to the right jaw which he initially observed 4-5 months ago. He is not using tobacco products at this time. The patient's most recent ENT appointment was with Dr. Erik Obey on 90/24/09.  He has no visit with Dr Vicie Mutters but was mean to see him this Spring per Dr Waynard Edwards note.  He reports Dr. Wilford Corner is treating the patient with antibiotics for an  ulcer in the esophagus.  Stenosis was also noted. He is also taking antibiotics per Dr. Erik Obey for sinus problems.    ALLERGIES:  has No Known Allergies.  Meds: Current Outpatient Prescriptions  Medication Sig Dispense Refill  . brimonidine-timolol (COMBIGAN) 0.2-0.5 % ophthalmic solution Place 1 drop into both eyes 2 (two) times daily.     Marland Kitchen DHEA 50 MG TABS Take 1 tablet by mouth daily.    Marland Kitchen doxycycline (VIBRAMYCIN) 50 MG capsule TAKE ONE CAPSULE BY MOUTH TWICE A DAY    . fluticasone (FLONASE) 50 MCG/ACT nasal spray Place 2 sprays into both nostrils daily.  4  . furosemide (LASIX) 20 MG tablet Take 20 mg by mouth.    . latanoprost (XALATAN) 0.005 % ophthalmic solution Place 1 drop into both eyes every evening.   4  . Lutein-Zeaxanthin 15-0.7 MG CAPS Take 1 tablet by mouth daily.    . Melatonin 10 MG TABS Take 10 mg by mouth at bedtime as needed (for sleep).    . metoprolol succinate (TOPROL XL) 25 MG 24 hr tablet Take 1 tablet (25 mg total) by mouth daily. 30 tablet 6  . sodium chloride (OCEAN) 0.65 % nasal spray Place 2 sprays into the nose as needed for congestion.     . sodium fluoride (DENTA 5000 PLUS) 1.1 % CREA dental cream Apply a thin ribbon of gel to tooth brush. Brush teeth for 2 minutes. Spit out excess.  Repeat nightly. (Patient taking differently: Place 1 application onto teeth at bedtime. Apply a thin ribbon of gel to tooth brush. Brush teeth for 2 minutes. Spit out excess. Repeat nightly.) 51 g 0  . triamcinolone cream (KENALOG) 0.1 % APPLY TO AFFECTED AREA TWICE DAILY 80 g 2  . feeding supplement, ENSURE ENLIVE, (ENSURE ENLIVE) LIQD Take 237 mLs by mouth 2 (two) times daily between meals. (Patient not taking: Reported on 10/03/2016) 237 mL 12  . potassium chloride SA (K-DUR,KLOR-CON) 20 MEQ tablet Take 1 tablet (20 mEq total) by mouth every other day. (Patient not taking: Reported on 10/03/2016) 90 tablet 3  . traMADol (ULTRAM) 50 MG tablet Take 1 tablet (50 mg total) by mouth  every 6 (six) hours as needed for moderate pain. (Patient not taking: Reported on 10/03/2016) 30 tablet 0  . zolpidem (AMBIEN) 5 MG tablet TAKE 1 TABLET AT BEDTIME AS NEEDED FOR SLEEP (Patient not taking: Reported on 10/03/2016) 30 tablet 2   No current facility-administered medications for this encounter.     Physical Findings: The patient is in no acute distress. Patient is alert and oriented.    Wt Readings from Last 3 Encounters:  10/03/16 140 lb (63.5 kg)  07/25/16 138 lb 1.9 oz (62.7 kg)  07/23/16 132 lb (59.9 kg)    height is 5\' 8"  (1.727 m) and weight is 140 lb (63.5 kg). His temperature is 97.9 F (36.6 C). His blood pressure is 118/84 and his pulse is 71. His oxygen saturation is 98%.   General: Alert and oriented, in no acute distress. HEENT: Patient has some patchy thrush along the right buccal mucosa. No sign of recurrence. Post op changes.  Facial edema is relatively stable. Neck: No palpable cervical or supraclavicular lymphadenopathy.  Lymph: see HEENT and Neck Exam. Heart: Regular in rate and rhythm. Chest: Clear to auscultation bilaterally. Psychiatric: Judgment and insight are intact. Affect is appropriate. MSK: Uses a cane to ambulate.  Lab Findings: Lab Results  Component Value Date   WBC 10.3 04/14/2016   HGB 12.4 (L) 04/14/2016   HCT 36.5 (L) 04/14/2016   MCV 109.9 (H) 04/14/2016   PLT 182.0 04/14/2016   CMP     Component Value Date/Time   NA 135 09/19/2016 1013   NA 135 (L) 10/16/2015 1039   K 3.8 09/19/2016 1013   K 3.0 (LL) 10/16/2015 1039   CL 87 (L) 09/19/2016 1013   CO2 30 (H) 09/19/2016 1013   CO2 36 (H) 10/16/2015 1039   GLUCOSE 107 (H) 09/19/2016 1013   GLUCOSE 75 07/23/2016 0815   GLUCOSE 129 10/16/2015 1039   BUN 15 09/19/2016 1013   BUN 11.8 10/16/2015 1039   CREATININE 0.75 (L) 09/19/2016 1013   CREATININE 0.79 04/03/2016 1021   CREATININE 1.1 10/16/2015 1039   CALCIUM 9.3 09/19/2016 1013   CALCIUM 9.9 10/16/2015 1039   PROT 5.5  (L) 03/19/2016 0151   PROT 7.5 10/16/2015 1039   ALBUMIN 2.7 (L) 03/19/2016 0151   ALBUMIN 3.4 (L) 10/16/2015 1039   AST 46 (H) 03/19/2016 0151   AST 49 (H) 10/16/2015 1039   ALT 25 03/19/2016 0151   ALT 20 10/16/2015 1039   ALKPHOS 76 03/19/2016 0151   ALKPHOS 104 10/16/2015 1039   BILITOT 1.2 03/19/2016 0151   BILITOT 0.81 10/16/2015 1039   GFRNONAA 84 09/19/2016 1013   GFRAA 97 09/19/2016 1013     Lab Results  Component Value Date   TSH 2.647 10/03/2016    Radiographic  Findings: Ct Angio Chest Aorta W/cm &/or Wo/cm  Result Date: 09/26/2016 CLINICAL DATA:  Thoracic ascending aortic aneurysm. EXAM: CT ANGIOGRAPHY CHEST WITH CONTRAST TECHNIQUE: Multidetector CT imaging of the chest was performed using the standard protocol during bolus administration of intravenous contrast. Multiplanar CT image reconstructions and MIPs were obtained to evaluate the vascular anatomy. CONTRAST:  100 mL Isovue 370 intravenously. COMPARISON:  CT scan of March 18, 2016. FINDINGS: Cardiovascular: 4.2 cm ascending thoracic aortic aneurysm which appears to be stable compared to prior exam based on my own measurements. Atherosclerosis of thoracic aorta is noted without aneurysm formation. Great vessels are widely patent without significant stenosis. Transverse aortic arch measures 2.9 cm. Proximal descending thoracic aorta measures 2.5 cm. Normal cardiac size. No pericardial effusion. Mediastinum/Nodes: No enlarged mediastinal, hilar, or axillary lymph nodes. Thyroid gland, trachea, and esophagus demonstrate no significant findings. Stable moderate size hiatal hernia. Lungs/Pleura: No pneumothorax or pleural effusion is noted. 19 x 11 mm right lower lobe nodule is noted which is unchanged compared to prior exam. There is stable adjacent scarring. Upper Abdomen: No acute abnormality. Musculoskeletal: No chest wall abnormality. No acute or significant osseous findings. Review of the MIP images confirms the above  findings. IMPRESSION: 4.2 cm ascending thoracic aortic aneurysm which is stable in size compared to prior exam based on my own measurements. Recommend annual imaging followup by CTA or MRA. This recommendation follows 2010 ACCF/AHA/AATS/ACR/ASA/SCA/SCAI/SIR/STS/SVM Guidelines for the Diagnosis and Management of Patients with Thoracic Aortic Disease. Circulation. 2010; 121: B510-C585. Stable right lower lobe pulmonary nodule. Stable moderate size hiatal hernia. Electronically Signed   By: Marijo Conception, M.D.   On: 09/26/2016 13:44    Impression/Plan:     1) Head and Neck Cancer Status: No evidence of recurrence on clinical exam.  2) Nutritional Status: Gaining weight.  Wt Readings from Last 3 Encounters:  10/03/16 140 lb (63.5 kg)  07/25/16 138 lb 1.9 oz (62.7 kg)  07/23/16 132 lb (59.9 kg)   3) Risk Factors: The patient has been educated about risk factors including alcohol and tobacco abuse; they understand that avoidance of alcohol and tobacco is important to prevent recurrences as well as other cancers.  4) Swallowing: following with GI, Dr. Wilford Corner ... Consideration of Dilation of stenosis of esophagus to be determined per him  5) Dental: The patient has been encouraged to continue regular followup with dentistry, and dental hygiene maintenance.   6) Thyroid function: WNL; The patient understands the importance of monitoring his thyroid levels and function in regards to the management of his disease. Check yearly.  Lab Results  Component Value Date   TSH 2.647 10/03/2016   7) Social: No active social issues to address at this time.  8) I will prescribe liquid fluconazole 3 week supply for oral thrush - he is on ABX which likely caused this. The patient will continue to check for thrush in the future and request refills on fluconazole as needed.  9) I will refer the patient back to Dr. Vicie Mutters to see the patient in 6 months. The patient will follow up with me in 1 year. I  encouraged him to contact the clinic sooner with any questions or concerns that may arise in the interim.  I encouraged the patient to speak to his pharmacist about getting his medications in liquid form due to difficulty swallowing.  I will CC Dr. Suella Broad and Dr. Wilford Corner on my note. I recommend the patient make sure Dr. Nelva Bush knows  the patient has a history of bladder and oral cancer. If MRI or other imaging would be prudent to rule out metastatic disease, Dr. Nelva Bush may order this.  This would be uncommon without local recurrence or lung mets, but possible. The patient will ask Dr. Michail Sermon if there is anything else that can be done to alleviate the patient's swallowing issues.   25 minutes spent face to face with patient, over 50% on counseling and care coordination.  _____________________________   Eppie Gibson, MD  This document serves as a record of services personally performed by Eppie Gibson, MD. It was created on her behalf by Maryla Morrow, a trained medical scribe. The creation of this record is based on the scribe's personal observations and the provider's statements to them. This document has been checked and approved by the attending provider.

## 2016-10-15 ENCOUNTER — Ambulatory Visit (HOSPITAL_BASED_OUTPATIENT_CLINIC_OR_DEPARTMENT_OTHER): Payer: Medicare Other | Admitting: Hematology and Oncology

## 2016-10-15 ENCOUNTER — Telehealth: Payer: Self-pay | Admitting: Hematology and Oncology

## 2016-10-15 ENCOUNTER — Other Ambulatory Visit (HOSPITAL_BASED_OUTPATIENT_CLINIC_OR_DEPARTMENT_OTHER): Payer: Medicare Other

## 2016-10-15 DIAGNOSIS — Z85818 Personal history of malignant neoplasm of other sites of lip, oral cavity, and pharynx: Secondary | ICD-10-CM | POA: Diagnosis not present

## 2016-10-15 DIAGNOSIS — R634 Abnormal weight loss: Secondary | ICD-10-CM | POA: Diagnosis not present

## 2016-10-15 DIAGNOSIS — Z85819 Personal history of malignant neoplasm of unspecified site of lip, oral cavity, and pharynx: Secondary | ICD-10-CM

## 2016-10-15 DIAGNOSIS — C9110 Chronic lymphocytic leukemia of B-cell type not having achieved remission: Secondary | ICD-10-CM

## 2016-10-15 DIAGNOSIS — R5381 Other malaise: Secondary | ICD-10-CM

## 2016-10-15 DIAGNOSIS — Z8551 Personal history of malignant neoplasm of bladder: Secondary | ICD-10-CM | POA: Diagnosis not present

## 2016-10-15 DIAGNOSIS — Z1329 Encounter for screening for other suspected endocrine disorder: Secondary | ICD-10-CM

## 2016-10-15 DIAGNOSIS — R1310 Dysphagia, unspecified: Secondary | ICD-10-CM | POA: Diagnosis not present

## 2016-10-15 DIAGNOSIS — C148 Malignant neoplasm of overlapping sites of lip, oral cavity and pharynx: Secondary | ICD-10-CM

## 2016-10-15 DIAGNOSIS — R5383 Other fatigue: Secondary | ICD-10-CM

## 2016-10-15 LAB — CBC WITH DIFFERENTIAL/PLATELET
BASO%: 0.9 % (ref 0.0–2.0)
Basophils Absolute: 0.1 10*3/uL (ref 0.0–0.1)
EOS ABS: 0.1 10*3/uL (ref 0.0–0.5)
EOS%: 0.7 % (ref 0.0–7.0)
HCT: 34.7 % — ABNORMAL LOW (ref 38.4–49.9)
HEMOGLOBIN: 12.1 g/dL — AB (ref 13.0–17.1)
LYMPH%: 32.7 % (ref 14.0–49.0)
MCH: 36.8 pg — ABNORMAL HIGH (ref 27.2–33.4)
MCHC: 34.8 g/dL (ref 32.0–36.0)
MCV: 105.7 fL — ABNORMAL HIGH (ref 79.3–98.0)
MONO#: 1 10*3/uL — ABNORMAL HIGH (ref 0.1–0.9)
MONO%: 11.3 % (ref 0.0–14.0)
NEUT%: 54.4 % (ref 39.0–75.0)
NEUTROS ABS: 4.6 10*3/uL (ref 1.5–6.5)
Platelets: 217 10*3/uL (ref 140–400)
RBC: 3.28 10*6/uL — ABNORMAL LOW (ref 4.20–5.82)
RDW: 13.6 % (ref 11.0–14.6)
WBC: 8.4 10*3/uL (ref 4.0–10.3)
lymph#: 2.8 10*3/uL (ref 0.9–3.3)

## 2016-10-15 LAB — TSH: TSH: 2.111 m[IU]/L (ref 0.320–4.118)

## 2016-10-15 NOTE — Telephone Encounter (Signed)
Gave patient AVS  And calender per 4/25 los.

## 2016-10-16 ENCOUNTER — Encounter: Payer: Self-pay | Admitting: Hematology and Oncology

## 2016-10-16 DIAGNOSIS — R1310 Dysphagia, unspecified: Secondary | ICD-10-CM | POA: Insufficient documentation

## 2016-10-16 NOTE — Progress Notes (Signed)
[facility] OFFICE PROGRESS NOTE  Patient Care Team: Marletta Lor, MD as PCP - General Jodi Marble, MD as Consulting Physician (Otolaryngology) Heath Lark, MD as Consulting Physician (Hematology and Oncology) Karie Mainland, RD as Dietitian (Nutrition) Eppie Gibson, MD as Attending Physician (Radiation Oncology)  SUMMARY OF ONCOLOGIC HISTORY: Oncology History   Oral cancer   Staging form: Lip and Oral Cavity, AJCC 7th Edition     Clinical: Stage IVA (T4a, N0, M0) - Signed by Heath Lark, MD on 06/07/2014       CLL (chronic lymphocytic leukemia) ([place])   07/21/2012 Initial Diagnosis    Accession #: [id number] FLow cytometry confirmed CLL       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 Biopsy was performed on the right tonsil      05/24/2014 Pathology Results    Accession: [id number] right tonsil biopsy confirmed squamous cell carcinoma.      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      09/04/2014 - 10/13/2014 Radiation Therapy    Rec'd Helical IMRT to right retromolar region (tumor bed) and right neck: 60 Gy in 30 fractions to tumor bed and 54 Gy in 30 fractions to neck.      01/04/2015 Imaging    CT Neck:  No residual oral cavity tumor or lymphadenopathy identified.                                 01/04/2015 Imaging    CT  Chest:  No evidence of metastatic disease.   Pulmonary nodule in right base has remained stable since 2004 and is compatible with a benign process.        History of bladder cancer   02/02/2015 Surgery    He underwent TURBT      02/02/2015 Pathology Results    Accession: [id number] TURBT result showed high grade urothelial cancer      03/27/2015 Surgery    He had Cystoscopy, bladder biopsy and fulguration, 2 - 5 cm      03/27/2015 Pathology Results    Accession: [id number] pathology was negative for residual disease      09/26/2016 Imaging    CT angiogram showed ascending thoracic aortic aneurysm which is stable in size compared to prior exam based on my own measurements. Stable right lower lobe pulmonary nodule. Stable moderate size hiatal hernia       INTERVAL HISTORY: Please see below for problem oriented charting. He returns for further follow-up. He complained of dysphagia and recent weight loss. Apparently, he had EGD and was found to have ulcer, treated appropriately. He denies new lymphadenopathy.  REVIEW OF SYSTEMS:   Constitutional: Denies fevers, chills  Eyes: Denies blurriness of vision Ears, nose, mouth, throat, and face: Denies mucositis or sore throat Respiratory: Denies cough, dyspnea  or wheezes Cardiovascular: Denies palpitation, chest discomfort or lower extremity swelling Gastrointestinal:  Denies nausea, heartburn or change in bowel habits Skin: Denies abnormal skin rashes Lymphatics: Denies new lymphadenopathy or easy bruising Neurological:Denies numbness, tingling or new weaknesses Behavioral/Psych: Mood is stable, no new changes  All other systems were reviewed with the patient and are negative.  I have reviewed the past medical history, past surgical history, social history and family history with the patient and they are unchanged from previous note.  ALLERGIES:  has No Known Allergies.  MEDICATIONS:  Current Outpatient Prescriptions  Medication  Sig Dispense Refill  . brimonidine-timolol (COMBIGAN) 0.2-0.5 % ophthalmic solution Place 1 drop into both eyes 2 (two) times daily.     Marland Kitchen DHEA 50 MG TABS Take 1 tablet by mouth daily.    Marland Kitchen doxycycline (VIBRAMYCIN) 50 MG capsule TAKE ONE CAPSULE BY MOUTH TWICE A DAY    . feeding supplement, ENSURE ENLIVE, (ENSURE ENLIVE) LIQD Take 237 mLs by mouth 2 (two) times daily between meals. (Patient not taking: Reported on 10/03/2016) 237 mL 12  . fluconazole (DIFLUCAN) 10 MG/ML suspension Take 5mL (200mg ) today and then 76mL (100mg ) daily for 20 more days. 220 mL 0  . fluticasone (FLONASE) 50 MCG/ACT nasal spray Place 2 sprays into both nostrils daily.  4  . furosemide (LASIX) 20 MG tablet Take 20 mg by mouth.    . latanoprost (XALATAN) 0.005 % ophthalmic solution Place 1 drop into both eyes every evening.   4  . Lutein-Zeaxanthin 15-0.7 MG CAPS Take 1 tablet by mouth daily.    . Melatonin 10 MG TABS Take 10 mg by mouth at bedtime as needed (for sleep).    . metoprolol succinate (TOPROL XL) 25 MG 24 hr tablet Take 1 tablet (25 mg total) by mouth daily. 30 tablet 6  . potassium chloride SA (K-DUR,KLOR-CON) 20 MEQ tablet Take 1 tablet (20 mEq total) by mouth every other day. (Patient not taking: Reported on 10/03/2016) 90 tablet 3  . sodium chloride (OCEAN) 0.65 % nasal spray Place 2 sprays into the nose as needed for congestion.     . sodium fluoride (DENTA 5000 PLUS) 1.1 % CREA dental cream Apply a thin ribbon of gel to tooth brush. Brush teeth for 2 minutes. Spit out excess. Repeat nightly. (Patient taking differently: Place 1 application onto teeth at bedtime. Apply a thin ribbon of gel to tooth brush. Brush teeth for 2 minutes. Spit out excess. Repeat nightly.) 51 g 0  . traMADol (ULTRAM) 50 MG tablet Take 1 tablet (50 mg total) by mouth every 6 (six) hours as needed for moderate pain. (Patient not taking: Reported on 10/03/2016) 30 tablet 0  . triamcinolone cream (KENALOG) 0.1 % APPLY TO AFFECTED AREA  TWICE DAILY 80 g 2  . zolpidem (AMBIEN) 5 MG tablet TAKE 1 TABLET AT BEDTIME AS NEEDED FOR SLEEP (Patient not taking: Reported on 10/03/2016) 30 tablet 2   No current facility-administered medications for this visit.     PHYSICAL EXAMINATION: ECOG PERFORMANCE STATUS: 1 - Symptomatic but completely ambulatory  Vitals:   10/15/16 1058  BP: (!) 104/58  Pulse: 72  Resp: 18  Temp: 98.3 F (36.8 C)   Filed Weights   10/15/16 1058  Weight: 132 lb 1.6 oz (59.9 kg)    GENERAL:alert, no distress and comfortable SKIN: skin color, texture, turgor are normal, no rashes or significant lesions EYES: normal, Conjunctiva are pink and non-injected, sclera clear OROPHARYNX: Noted significant facial surgery.  No  thrush  LYMPH:  no palpable lymphadenopathy in the cervical, axillary or inguinal LUNGS: clear to auscultation and percussion with normal breathing effort HEART: regular rate & rhythm and no murmurs and no lower extremity edema ABDOMEN:abdomen soft, non-tender and normal bowel sounds Musculoskeletal:no cyanosis of digits and no clubbing  NEURO: alert & oriented x 3 with fluent speech, no focal motor/sensory deficits  LABORATORY DATA:  I have reviewed the data as listed    Component Value Date/Time   NA 135 09/19/2016 1013   NA 135 (L) 10/16/2015 1039   K 3.8 09/19/2016 1013   K 3.0 (LL) 10/16/2015 1039   CL 87 (L) 09/19/2016 1013   CO2 30 (H) 09/19/2016 1013   CO2 36 (H) 10/16/2015 1039   GLUCOSE 107 (H) 09/19/2016 1013   GLUCOSE 75 07/23/2016 0815   GLUCOSE 129 10/16/2015 1039   BUN 15 09/19/2016 1013   BUN 11.8 10/16/2015 1039   CREATININE 0.75 (L) 09/19/2016 1013   CREATININE 0.79 04/03/2016 1021   CREATININE 1.1 10/16/2015 1039   CALCIUM 9.3 09/19/2016 1013   CALCIUM 9.9 10/16/2015 1039   PROT 5.5 (L) 03/19/2016 0151   PROT 7.5 10/16/2015 1039   ALBUMIN 2.7 (L) 03/19/2016 0151   ALBUMIN 3.4 (L) 10/16/2015 1039   AST 46 (H) 03/19/2016 0151   AST 49 (H) 10/16/2015 1039    ALT 25 03/19/2016 0151   ALT 20 10/16/2015 1039   ALKPHOS 76 03/19/2016 0151   ALKPHOS 104 10/16/2015 1039   BILITOT 1.2 03/19/2016 0151   BILITOT 0.81 10/16/2015 1039   GFRNONAA 84 09/19/2016 1013   GFRAA 97 09/19/2016 1013    No results found for: SPEP, UPEP  Lab Results  Component Value Date   WBC 8.4 10/15/2016   NEUTROABS 4.6 10/15/2016   HGB 12.1 (L) 10/15/2016   HCT 34.7 (L) 10/15/2016   MCV 105.7 (H) 10/15/2016   PLT 217 10/15/2016      Chemistry      Component Value Date/Time   NA 135 09/19/2016 1013   NA 135 (L) 10/16/2015 1039   K 3.8 09/19/2016 1013   K 3.0 (LL) 10/16/2015 1039   CL 87 (L) 09/19/2016 1013   CO2 30 (H) 09/19/2016 1013   CO2 36 (H) 10/16/2015 1039   BUN 15 09/19/2016 1013   BUN 11.8 10/16/2015 1039   CREATININE 0.75 (L) 09/19/2016 1013   CREATININE 0.79 04/03/2016 1021   CREATININE 1.1 10/16/2015 1039      Component Value Date/Time   CALCIUM 9.3 09/19/2016 1013   CALCIUM 9.9 10/16/2015 1039   ALKPHOS 76 03/19/2016 0151   ALKPHOS 104 10/16/2015 1039   AST 46 (H) 03/19/2016 0151   AST 49 (H) 10/16/2015 1039   ALT 25 03/19/2016 0151   ALT 20 10/16/2015 1039   BILITOT 1.2 03/19/2016 0151   BILITOT 0.81 10/16/2015 1039       RADIOGRAPHIC STUDIES: I have personally reviewed the radiological images as listed and agreed with the findings in the report. Ct Angio Chest Aorta W/cm &/or Wo/cm  Result Date: 09/26/2016 CLINICAL DATA:  Thoracic ascending aortic aneurysm. EXAM: CT ANGIOGRAPHY CHEST WITH CONTRAST TECHNIQUE: Multidetector CT imaging of the chest was performed using the standard protocol during bolus administration of intravenous contrast. Multiplanar CT image reconstructions and MIPs were obtained to evaluate the vascular anatomy. CONTRAST:  100 mL Isovue 370 intravenously. COMPARISON:  CT scan of March 18, 2016. FINDINGS: Cardiovascular: 4.2 cm ascending thoracic aortic aneurysm which appears to be stable  compared to prior exam  based on my own measurements. Atherosclerosis of thoracic aorta is noted without aneurysm formation. Great vessels are widely patent without significant stenosis. Transverse aortic arch measures 2.9 cm. Proximal descending thoracic aorta measures 2.5 cm. Normal cardiac size. No pericardial effusion. Mediastinum/Nodes: No enlarged mediastinal, hilar, or axillary lymph nodes. Thyroid gland, trachea, and esophagus demonstrate no significant findings. Stable moderate size hiatal hernia. Lungs/Pleura: No pneumothorax or pleural effusion is noted. 19 x 11 mm right lower lobe nodule is noted which is unchanged compared to prior exam. There is stable adjacent scarring. Upper Abdomen: No acute abnormality. Musculoskeletal: No chest wall abnormality. No acute or significant osseous findings. Review of the MIP images confirms the above findings. IMPRESSION: 4.2 cm ascending thoracic aortic aneurysm which is stable in size compared to prior exam based on my own measurements. Recommend annual imaging followup by CTA or MRA. This recommendation follows 2010 ACCF/AHA/AATS/ACR/ASA/SCA/SCAI/SIR/STS/SVM Guidelines for the Diagnosis and Management of Patients with Thoracic Aortic Disease. Circulation. 2010; 121: K957-M734. Stable right lower lobe pulmonary nodule. Stable moderate size hiatal hernia. Electronically Signed   By: Marijo Conception, M.D.   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 Clinically, he has no evidence of disease I will defer for future follow-up to ENT and radiation oncology   CLL (chronic lymphocytic leukemia) He remained with low-grade disease burden stage I only. No need for treatment. I will see him back in one year  Dysphagia He had recent weight loss secondary to profound dysphagia and recent infection The infection has cleared up I recommend close ENT follow-up in consideration for esophageal dilatation if  needed to help with the dysphagia. I recommend he call his ENT physician for follow-up   No orders of the defined types were placed in this encounter.  All questions were answered. The patient knows to call the clinic with any problems, questions or concerns. No barriers to learning was detected. I spent 15 minutes counseling the patient face to face. The total time spent in the appointment was 20 minutes and more than 50% was on counseling and review of test results     Heath Lark, MD 10/16/2016 7:44 AM

## 2016-10-16 NOTE — Assessment & Plan Note (Signed)
He has completed all treatment. Clinically, he has no evidence of disease I will defer for future follow-up to ENT and radiation oncology

## 2016-10-16 NOTE — Assessment & Plan Note (Signed)
He had recent weight loss secondary to profound dysphagia and recent infection The infection has cleared up I recommend close ENT follow-up in consideration for esophageal dilatation if needed to help with the dysphagia. I recommend he call his ENT physician for follow-up

## 2016-10-16 NOTE — Assessment & Plan Note (Signed)
He remained with low-grade disease burden stage I only. No need for treatment. I will see him back in one year

## 2016-10-24 DIAGNOSIS — M47816 Spondylosis without myelopathy or radiculopathy, lumbar region: Secondary | ICD-10-CM | POA: Diagnosis not present

## 2016-10-24 DIAGNOSIS — M961 Postlaminectomy syndrome, not elsewhere classified: Secondary | ICD-10-CM | POA: Diagnosis not present

## 2016-10-24 DIAGNOSIS — M5136 Other intervertebral disc degeneration, lumbar region: Secondary | ICD-10-CM | POA: Diagnosis not present

## 2016-11-11 DIAGNOSIS — Z9622 Myringotomy tube(s) status: Secondary | ICD-10-CM | POA: Diagnosis not present

## 2016-11-11 DIAGNOSIS — K222 Esophageal obstruction: Secondary | ICD-10-CM | POA: Diagnosis not present

## 2016-11-11 DIAGNOSIS — K259 Gastric ulcer, unspecified as acute or chronic, without hemorrhage or perforation: Secondary | ICD-10-CM | POA: Diagnosis not present

## 2016-11-11 DIAGNOSIS — H6981 Other specified disorders of Eustachian tube, right ear: Secondary | ICD-10-CM | POA: Diagnosis not present

## 2016-11-11 DIAGNOSIS — H6121 Impacted cerumen, right ear: Secondary | ICD-10-CM | POA: Diagnosis not present

## 2016-11-11 DIAGNOSIS — Z923 Personal history of irradiation: Secondary | ICD-10-CM | POA: Diagnosis not present

## 2016-11-11 DIAGNOSIS — R1314 Dysphagia, pharyngoesophageal phase: Secondary | ICD-10-CM | POA: Diagnosis not present

## 2016-11-11 DIAGNOSIS — Z85818 Personal history of malignant neoplasm of other sites of lip, oral cavity, and pharynx: Secondary | ICD-10-CM | POA: Diagnosis not present

## 2016-11-12 DIAGNOSIS — K222 Esophageal obstruction: Secondary | ICD-10-CM | POA: Insufficient documentation

## 2016-11-13 DIAGNOSIS — H6123 Impacted cerumen, bilateral: Secondary | ICD-10-CM | POA: Insufficient documentation

## 2016-11-14 ENCOUNTER — Other Ambulatory Visit: Payer: Self-pay | Admitting: Otolaryngology

## 2016-11-14 DIAGNOSIS — R1314 Dysphagia, pharyngoesophageal phase: Secondary | ICD-10-CM

## 2016-11-21 ENCOUNTER — Other Ambulatory Visit: Payer: Self-pay | Admitting: Internal Medicine

## 2016-11-26 ENCOUNTER — Other Ambulatory Visit: Payer: Self-pay | Admitting: Internal Medicine

## 2016-11-27 ENCOUNTER — Ambulatory Visit
Admission: RE | Admit: 2016-11-27 | Discharge: 2016-11-27 | Disposition: A | Discharge status: Home / Self Care | Payer: Medicare Other | Source: Ambulatory Visit | Attending: Otolaryngology | Admitting: Otolaryngology

## 2016-11-27 DIAGNOSIS — R1314 Dysphagia, pharyngoesophageal phase: Secondary | ICD-10-CM | POA: Diagnosis not present

## 2016-12-01 DIAGNOSIS — C672 Malignant neoplasm of lateral wall of bladder: Secondary | ICD-10-CM | POA: Diagnosis not present

## 2016-12-17 DIAGNOSIS — C062 Malignant neoplasm of retromolar area: Secondary | ICD-10-CM | POA: Diagnosis not present

## 2016-12-17 DIAGNOSIS — H9113 Presbycusis, bilateral: Secondary | ICD-10-CM | POA: Diagnosis not present

## 2016-12-17 DIAGNOSIS — R1314 Dysphagia, pharyngoesophageal phase: Secondary | ICD-10-CM | POA: Diagnosis not present

## 2016-12-17 DIAGNOSIS — R42 Dizziness and giddiness: Secondary | ICD-10-CM | POA: Insufficient documentation

## 2016-12-17 DIAGNOSIS — H6981 Other specified disorders of Eustachian tube, right ear: Secondary | ICD-10-CM | POA: Diagnosis not present

## 2016-12-18 ENCOUNTER — Other Ambulatory Visit: Payer: Self-pay | Admitting: Otolaryngology

## 2016-12-18 DIAGNOSIS — C062 Malignant neoplasm of retromolar area: Secondary | ICD-10-CM

## 2016-12-23 ENCOUNTER — Other Ambulatory Visit: Payer: Self-pay | Admitting: Internal Medicine

## 2016-12-23 DIAGNOSIS — R1310 Dysphagia, unspecified: Secondary | ICD-10-CM | POA: Diagnosis not present

## 2016-12-23 DIAGNOSIS — K219 Gastro-esophageal reflux disease without esophagitis: Secondary | ICD-10-CM | POA: Diagnosis not present

## 2016-12-23 NOTE — Telephone Encounter (Signed)
11/26/16 was last refill. Rx must last 30 days. May call in Rx on 12/26/16. 

## 2016-12-25 ENCOUNTER — Ambulatory Visit
Admission: RE | Admit: 2016-12-25 | Discharge: 2016-12-25 | Disposition: A | Discharge status: Home / Self Care | Payer: Medicare Other | Source: Ambulatory Visit | Attending: Otolaryngology | Admitting: Otolaryngology

## 2016-12-25 DIAGNOSIS — C062 Malignant neoplasm of retromolar area: Secondary | ICD-10-CM

## 2016-12-25 MED ORDER — IOPAMIDOL (ISOVUE-300) INJECTION 61%
75.0000 mL | Freq: Once | INTRAVENOUS | Status: AC | PRN
  Administered 2016-12-25: 75 mL via INTRAVENOUS

## 2016-12-25 NOTE — Telephone Encounter (Signed)
Pt wife is aware med will be call in on 12-26-16

## 2016-12-26 NOTE — Telephone Encounter (Signed)
Rx is awaiting Dr Raliegh Ip signiture

## 2016-12-29 ENCOUNTER — Encounter: Payer: Self-pay | Admitting: Family Medicine

## 2017-01-02 ENCOUNTER — Ambulatory Visit: Payer: Medicare Other

## 2017-01-05 ENCOUNTER — Encounter: Payer: Self-pay | Admitting: Internal Medicine

## 2017-01-05 ENCOUNTER — Ambulatory Visit (INDEPENDENT_AMBULATORY_CARE_PROVIDER_SITE_OTHER): Payer: Medicare Other | Admitting: Internal Medicine

## 2017-01-05 VITALS — BP 100/52 | HR 78 | Temp 98.0°F | Ht 68.0 in | Wt 142.8 lb

## 2017-01-05 DIAGNOSIS — M79662 Pain in left lower leg: Secondary | ICD-10-CM

## 2017-01-05 DIAGNOSIS — E119 Type 2 diabetes mellitus without complications: Secondary | ICD-10-CM | POA: Diagnosis not present

## 2017-01-05 DIAGNOSIS — M7989 Other specified soft tissue disorders: Secondary | ICD-10-CM | POA: Diagnosis not present

## 2017-01-05 DIAGNOSIS — I10 Essential (primary) hypertension: Secondary | ICD-10-CM | POA: Diagnosis not present

## 2017-01-05 DIAGNOSIS — R600 Localized edema: Secondary | ICD-10-CM | POA: Diagnosis not present

## 2017-01-05 DIAGNOSIS — C062 Malignant neoplasm of retromolar area: Secondary | ICD-10-CM | POA: Diagnosis not present

## 2017-01-05 MED ORDER — AMOXICILLIN-POT CLAVULANATE 875-125 MG PO TABS: 1.0000 | ORAL_TABLET | Freq: Two times a day (BID) | ORAL | 0 refills | Status: DC

## 2017-01-05 NOTE — Patient Instructions (Addendum)
Limit your sodium (Salt) intake  Keep lower extremities elevated as much as possible  Venous Doppler ultrasound as scheduled  Return in one week for follow-up  Take your antibiotic as prescribed until ALL of it is gone, but stop if you develop a rash, swelling, or any side effects of the medication.  Contact our office as soon as possible if  there are side effects of the medication.  Continue furosemide twice daily   Cellulitis, Adult Cellulitis is a skin infection. The infected area is usually red and tender. This condition occurs most often in the arms and lower legs. The infection can travel to the muscles, blood, and underlying tissue and become serious. It is very important to get treated for this condition. What are the causes? Cellulitis is caused by bacteria. The bacteria enter through a break in the skin, such as a cut, burn, insect bite, open sore, or crack. What increases the risk? This condition is more likely to occur in people who:  Have a weak defense system (immune system).  Have open wounds on the skin such as cuts, burns, bites, and scrapes. Bacteria can enter the body through these open wounds.  Are older.  Have diabetes.  Have a type of long-lasting (chronic) liver disease (cirrhosis) or kidney disease.  Use IV drugs.  What are the signs or symptoms? Symptoms of this condition include:  Redness, streaking, or spotting on the skin.  Swollen area of the skin.  Tenderness or pain when an area of the skin is touched.  Warm skin.  Fever.  Chills.  Blisters.  How is this diagnosed? This condition is diagnosed based on a medical history and physical exam. You may also have tests, including:  Blood tests.  Lab tests.  Imaging tests.  How is this treated? Treatment for this condition may include:  Medicines, such as antibiotic medicines or antihistamines.  Supportive care, such as rest and application of cold or warm cloths (cold or warm  compresses) to the skin.  Hospital care, if the condition is severe.  The infection usually gets better within 1-2 days of treatment. Follow these instructions at home:  Take over-the-counter and prescription medicines only as told by your health care provider.  If you were prescribed an antibiotic medicine, take it as told by your health care provider. Do not stop taking the antibiotic even if you start to feel better.  Drink enough fluid to keep your urine clear or pale yellow.  Do not touch or rub the infected area.  Raise (elevate) the infected area above the level of your heart while you are sitting or lying down.  Apply warm or cold compresses to the affected area as told by your health care provider.  Keep all follow-up visits as told by your health care provider. This is important. These visits let your health care provider make sure a more serious infection is not developing. Contact a health care provider if:  You have a fever.  Your symptoms do not improve within 1-2 days of starting treatment.  Your bone or joint underneath the infected area becomes painful after the skin has healed.  Your infection returns in the same area or another area.  You notice a swollen bump in the infected area.  You develop new symptoms.  You have a general ill feeling (malaise) with muscle aches and pains. Get help right away if:  Your symptoms get worse.  You feel very sleepy.  You develop vomiting or diarrhea that  persists.  You notice red streaks coming from the infected area.  Your red area gets larger or turns dark in color. This information is not intended to replace advice given to you by your health care provider. Make sure you discuss any questions you have with your health care provider. Document Released: 03/19/2005 Document Revised: 10/18/2015 Document Reviewed: 04/18/2015 Elsevier Interactive Patient Education  2017 [employer].

## 2017-01-05 NOTE — Progress Notes (Signed)
Subjective:    Patient ID: SHREYANSH TIFFANY, male    DOB: 17-Dec-1929, 83 y.o.   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 He has a history of chronic lower extremity edema and has been on furosemide 20 mg daily.  For the past several days she has had increasing pain and swelling involving the left lower leg.  He has developed an open wound involving the anterior lower leg just proximal to the ankle.  The leg has become redder and more painful. No prior history of DVT.  Past Medical History:  Diagnosis Date  . Alcohol abuse   . Allergic rhinitis   . Anemia in neoplastic disease    chronic  . Bilateral lower extremity edema    feet  . Borderline diabetes   . BPH (benign prostatic hypertrophy)   . CLL (chronic lymphocytic leukemia) [facility]) oncologist-  dr Heath Lark ([facility])   dx Jan 2014 --- Stage 1--  currently asymptomatic as of Apr 2016 and No active disease  . DDD (degenerative disc disease), cervical   . DDD (degenerative disc disease), lumbosacral   . 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 Dysphasia functional--  speech therapy   post op  radical neck dissection 07-21-2014--  changed diet to no chewy food , softer foods , states with the changes swallowed okay without any issues  . Glaucoma    both eyes  . History of gastroesophageal reflux (GERD)   . History of kidney stones    right side--  per CT from [facility] 01-17-2015 non-obstructing  . History of radiation therapy    09-04-2014 to 10-13-2014  Right retromolar region (tumor bed) and right neck/  60Gy in 30 fractions to tumor bed and 54Gy in 30  fractions to neck  . History of radiation to head and neck region 09-04-2014 to  10-13-2014   right retromolar region and right neck  60 Gy  . History of syncope    a. Event monitor 11/17: No evidence of significant AVB or > 3 second pause.; plan ILR if recurrent syncope in the future  . History of tracheostomy    post op radical neck dissection  07-21-2014--  post op tracheostomy on 07-25-2014  decannulated and sutured 07-29-2014  . Hypertension   . Macular degeneration    right eye only  . MRSA (methicillin resistant staph aureus) culture positive   . OA (osteoarthritis)   . Organic impotence   . Pneumonia 12/2015  . Pulmonary nodule    benign  and stable per last CT  . Radiation-induced dermatitis   . Squamous cell carcinoma of retromolar trigone [facility]) oncologist-  dr Isidore Moos   Stage IVB, pT1b, pN0, Grade 2, +PNI, no LVSI---  S/P  RADICAL NECK DISSECTION AND RADIATION  . Thoracic ascending aortic aneurysm ([place]) 03/28/2016   Chest CTA 9/17: 4.6 cm fusiform ascending thoracic aortic aneurysm // Chest CTA 4/18: Ascending thoracic aortic aneurysm 4.2 cm >> repeat 1 year  . 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 Transitional cell carcinoma of bladder [facility]) urologist-- dr Junious Silk  High - grade  . Venous insufficiency      Social History   Social History  . Marital status: Single    Spouse name: N/A  . Number of children: 2  . Years of education: N/A   Occupational History  . Not on file.   Social History Main Topics  . Smoking status: Never Smoker  . Smokeless tobacco: Never Used  . Alcohol use Yes     Comment: vodka 3-4 daily   (Hx alcohol withdrawal )  . Drug use: No  . Sexual activity: Not on file   Other Topics Concern  . Not on file   Social History Narrative   Patient is divorced with 2 children.   Patient has never smoked. Patient has never used smokeless tobacco.    Patient drinks gin or vodka on a daily basis.    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 CATARACT EXTRACTION W/ INTRAOCULAR LENS  IMPLANT, BILATERAL    . COLONOSCOPY N/A 07/23/2016   Procedure: COLONOSCOPY;  Surgeon: Wilford Corner, MD;  Location: [facility] ENDOSCOPY;  Service: Endoscopy;  Laterality: N/A;  . CYSTOSCOPY N/A 02/02/2015   Procedure: CYSTOSCOPY, INSTILLATION OF MITOMYCIN C;  Surgeon: Lowella Bandy, MD;  Location: [facility];  Service: Urology;  Laterality: N/A;  . CYSTOSCOPY WITH BIOPSY N/A 03/27/2015   Procedure: CYSTOSCOPY WITH BLADDER  BIOPSY WITH FULGERATION;  Surgeon: Festus Aloe, MD;  Location: [facility];  Service: Urology;  Laterality: N/A;  . ESOPHAGOGASTRODUODENOSCOPY N/A 07/23/2016   Procedure: ESOPHAGOGASTRODUODENOSCOPY (EGD);  Surgeon: Wilford Corner, MD;  Location: [facility] ENDOSCOPY;  Service: Endoscopy;  Laterality: N/A;  . INGUINAL HERNIA REPAIR Bilateral right 1988//  left 1970's  . LAMINECTOMY WITH POSTERIOR LATERAL ARTHRODESIS LEVEL 1  09-01-2003   L3 -5 LAMINECTOMY W/ DECOMPRESSION AND FUSION L4-5  . MAXILLECTOMY Right 07/21/14,   [facility]   Radical Neck Dissection, Maxillectomy, Right Marginal Mandibulectomy, dental extractions, Parascauplar fasciocutaeous Free flap reconstruction  . RETINAL DETACHMENT REPAIR W/ SCLERAL BUCKLE LE  11-03-2000  . ROTATOR CUFF REPAIR Right 12-11-2000  . tonsil biopsy Right 05/24/14  . TRANSURETHRAL RESECTION OF BLADDER TUMOR N/A 02/02/2015   Procedure: TRANSURETHRAL RESECTION OF BLADDER TUMOR (TURBT);  Surgeon: Lowella Bandy, MD;  [facility]  Service: Urology;  Laterality: N/A;    Family History  Problem Relation Age of Onset  . Cancer Mother        lung ca  . Alcohol abuse Sister   . Hypertension Neg Hx        family hx  . Sudden death Neg Hx        famiylhx  . Heart attack Neg Hx       No Known Allergies  Current Outpatient Prescriptions on File Prior to Visit  Medication Sig Dispense Refill  . brimonidine-timolol (COMBIGAN) 0.2-0.5 % ophthalmic solution Place 1 drop into both eyes 2 (two) times daily.     Marland Kitchen DHEA 50 MG TABS Take 1 tablet by mouth daily.    . feeding supplement, ENSURE ENLIVE, (ENSURE ENLIVE) LIQD Take 237 mLs by mouth 2 (two) times daily between meals. (Patient not taking: Reported on 10/03/2016) 237 mL 12  . furosemide (LASIX) 20 MG tablet Take 20 mg by mouth.    . latanoprost (XALATAN) 0.005 % ophthalmic solution Place  1 drop into both eyes every evening.   4  . Lutein-Zeaxanthin 15-0.7 MG CAPS Take 1 tablet by mouth daily.    . Melatonin 10 MG TABS Take 10 mg by mouth at bedtime as needed (for sleep).    . metoprolol succinate (TOPROL XL) 25 MG 24 hr tablet Take 1 tablet (25 mg total) by mouth daily. 30 tablet 6  . sodium chloride (OCEAN) 0.65 % nasal spray Place 2 sprays into the nose as needed for congestion.     . sodium fluoride (DENTA 5000 PLUS) 1.1 % CREA dental cream Apply a thin ribbon of gel to tooth brush. Brush teeth for 2 minutes. Spit out excess. Repeat nightly. (Patient taking differently: Place 1 application onto teeth at bedtime. Apply a thin ribbon of gel to tooth brush. Brush teeth for 2 minutes. Spit out excess. Repeat nightly.) 51 g 0  . traMADol (ULTRAM) 50 MG tablet Take 1 tablet (50 mg total) by mouth every 6 (six) hours as needed for moderate pain. (Patient not taking: Reported on 10/03/2016) 30 tablet 0  . triamcinolone cream (KENALOG) 0.1 % APPLY TO AFFECTED AREA TWICE DAILY 80 g 2  . zolpidem (AMBIEN) 5 MG tablet TAKE 1 TABLET BY MOUTH AT BEDTIME 30 tablet 0   No current facility-administered medications on file prior to visit.     BP (!) 100/52 (BP Location: Left Arm, Patient Position: Sitting, Cuff Size: Normal)   Pulse 78   Temp 98 F (36.7 C) (Oral)   Ht 5\' 8"  (1.727 m)   Wt 142 lb 12.8 oz (64.8 kg)   SpO2 98%   BMI  21.71 kg/m     Review of Systems  Constitutional: Positive for activity change.  Respiratory: Negative for shortness of breath and wheezing.   Cardiovascular: Positive for leg swelling.  Skin: Positive for rash and wound.  Neurological: Positive for weakness.       Objective:   Physical Exam  Constitutional: He appears well-developed and well-nourished. No distress.  Afebrile Blood pressure low normal No tachycardia O2 sat ration 98%  Pulmonary/Chest: Effort normal and breath sounds normal.  Musculoskeletal: He exhibits edema.  Chronic right lower leg, ankle and peel edema Significant left leg edema distal to the left knee The calf was erythematous, warm to touch and tender Area of desquamation involving the anterior lower leg just proximal to the ankle.          Assessment & Plan:   Cellulitis, left lower leg.  Will attempt to elevate local wound care discussed.  Will place on antibiotic therapy.  We'll check venous stop or to exclude unlikely DVT Chronic lower extremity edema.  Continue furosemide twice a day History of impaired glucose tolerance.  Stable off meds Essential hypertension.  Blood pressure low normal.  Continue metoprolol and diuretic therapy   We'll recheck in one week Check a lower extremity venous Doppler Consider referral to wound center next week if no dramatic improvement  [name]

## 2017-01-06 ENCOUNTER — Ambulatory Visit (HOSPITAL_COMMUNITY)
Admission: RE | Admit: 2017-01-06 | Discharge: 2017-01-06 | Disposition: A | Discharge status: Home / Self Care | Payer: Medicare Other | Source: Ambulatory Visit | Attending: Cardiovascular Disease | Admitting: Cardiovascular Disease

## 2017-01-06 DIAGNOSIS — M79662 Pain in left lower leg: Secondary | ICD-10-CM | POA: Diagnosis not present

## 2017-01-06 DIAGNOSIS — M7989 Other specified soft tissue disorders: Secondary | ICD-10-CM | POA: Diagnosis not present

## 2017-01-12 ENCOUNTER — Ambulatory Visit (INDEPENDENT_AMBULATORY_CARE_PROVIDER_SITE_OTHER): Payer: Medicare Other | Admitting: Internal Medicine

## 2017-01-12 ENCOUNTER — Encounter: Payer: Self-pay | Admitting: Internal Medicine

## 2017-01-12 VITALS — BP 98/60 | HR 72 | Temp 98.0°F | Ht 68.0 in | Wt 143.2 lb

## 2017-01-12 DIAGNOSIS — E119 Type 2 diabetes mellitus without complications: Secondary | ICD-10-CM | POA: Diagnosis not present

## 2017-01-12 DIAGNOSIS — I10 Essential (primary) hypertension: Secondary | ICD-10-CM | POA: Diagnosis not present

## 2017-01-12 NOTE — Progress Notes (Signed)
Subjective:    Patient ID: Timothy Kim, male    DOB: [birth year], 83 y.o.   MRN: [id number]  HPI  83 year old patient with complex past medical history.  He was seen last week and started on antibiotic therapy for a left lower extremity cellulitis. He has been on Augmentin, which he tolerates well. No fever, chills or other constitutional complaints.  His left leg has improved with much less swelling and erythema.  Past Medical History:  Diagnosis Date  . Alcohol abuse   . Allergic rhinitis   . Anemia in neoplastic disease    chronic  . Bilateral lower extremity edema    feet  . Borderline diabetes   . BPH (benign prostatic hypertrophy)   . CLL (chronic lymphocytic leukemia) [facility]) oncologist-  dr Heath Lark ([facility])   dx Jan 2014 --- Stage 1--  currently asymptomatic as of Apr 2016 and No active disease  . DDD (degenerative disc disease), cervical   . DDD (degenerative disc disease), lumbosacral   . 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 Dysphasia functional--  speech therapy   post op  radical neck dissection 07-21-2014--  changed diet to no chewy food , softer foods , states with the changes swallowed okay without any issues  . Glaucoma    both eyes  . History of gastroesophageal reflux (GERD)   . History of kidney stones    right side--  per CT from [facility] 01-17-2015 non-obstructing  . History of radiation therapy    09-04-2014 to 10-13-2014  Right retromolar region (tumor bed) and right neck/  60Gy in 30 fractions to tumor bed and 54Gy in 30 fractions to neck  . History of radiation to head and neck region 09-04-2014 to  10-13-2014   right retromolar region and right neck  60 Gy  . History of syncope    a. Event monitor 11/17: No evidence of significant AVB or > 3  second pause.; plan ILR if recurrent syncope in the future  . History of tracheostomy    post op radical neck dissection  07-21-2014--  post op tracheostomy on 07-25-2014  decannulated and sutured 07-29-2014  . Hypertension   . Macular degeneration    right eye only  . MRSA (methicillin resistant staph aureus) culture positive   . OA (osteoarthritis)   . Organic impotence   . Pneumonia 12/2015  . Pulmonary nodule    benign  and stable per last CT  . Radiation-induced dermatitis   . Squamous cell carcinoma of retromolar trigone [facility]) oncologist-  dr Isidore Moos   Stage IVB, pT1b, pN0, Grade 2, +PNI, no LVSI---  S/P  RADICAL NECK DISSECTION AND RADIATION  . Thoracic ascending aortic aneurysm ([place]) 03/28/2016   Chest CTA 9/17: 4.6 cm fusiform ascending thoracic aortic aneurysm // Chest CTA 4/18: Ascending thoracic aortic aneurysm 4.2 cm >> repeat 1 year  . 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 Transitional cell carcinoma of bladder [facility]) urologist-- dr Junious Silk   High - grade  . Venous insufficiency      Social History   Social History  . Marital status: Single    Spouse name: N/A  . Number of children: 2  . Years of education: N/A   Occupational History  .  Not on file.   Social History Main Topics  . Smoking status: Never Smoker  . Smokeless tobacco: Never Used  . Alcohol use Yes     Comment: vodka 3-4 daily   (Hx alcohol withdrawal )  . Drug use: No  . Sexual activity: Not on file   Other Topics Concern  . Not on file   Social History Narrative   Patient is divorced with 2 children.   Patient has never smoked. Patient has never used smokeless tobacco.    Patient drinks gin or vodka on a daily basis.   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 CATARACT EXTRACTION W/ INTRAOCULAR LENS   IMPLANT, BILATERAL    . COLONOSCOPY N/A 07/23/2016   Procedure: COLONOSCOPY;  Surgeon: Wilford Corner, MD;  Location: [facility] ENDOSCOPY;  Service: Endoscopy;  Laterality: N/A;  . CYSTOSCOPY N/A 02/02/2015   Procedure: CYSTOSCOPY, INSTILLATION OF MITOMYCIN C;  Surgeon: Lowella Bandy, MD;  Location: [facility];  Service: Urology;  Laterality: N/A;  . CYSTOSCOPY WITH BIOPSY N/A 03/27/2015   Procedure: CYSTOSCOPY WITH BLADDER  BIOPSY WITH FULGERATION;  Surgeon: Festus Aloe, MD;  Location: [facility];  Service: Urology;  Laterality: N/A;  . ESOPHAGOGASTRODUODENOSCOPY N/A 07/23/2016   Procedure: ESOPHAGOGASTRODUODENOSCOPY (EGD);  Surgeon: Wilford Corner, MD;  Location: [facility] ENDOSCOPY;  Service: Endoscopy;  Laterality: N/A;  . INGUINAL HERNIA REPAIR Bilateral right 1988//  left 1970's  . LAMINECTOMY WITH POSTERIOR LATERAL ARTHRODESIS LEVEL 1  09-01-2003   L3 -5 LAMINECTOMY W/ DECOMPRESSION AND FUSION L4-5  . MAXILLECTOMY Right 07/21/14,   [facility]   Radical Neck Dissection, Maxillectomy, Right Marginal Mandibulectomy, dental extractions, Parascauplar fasciocutaeous Free flap reconstruction  . RETINAL DETACHMENT REPAIR W/ SCLERAL BUCKLE LE  11-03-2000  . ROTATOR CUFF REPAIR Right 12-11-2000  . tonsil biopsy Right 05/24/14  . TRANSURETHRAL RESECTION OF BLADDER TUMOR N/A 02/02/2015   Procedure: TRANSURETHRAL RESECTION OF BLADDER TUMOR (TURBT);  Surgeon: Lowella Bandy, MD;  Location: [facility];  Service: Urology;  Laterality: N/A;    Family History  Problem Relation Age of Onset  . Cancer Mother        lung ca  . Alcohol abuse Sister   . Hypertension Neg Hx        family hx  . Sudden death Neg Hx        famiylhx  . Heart attack Neg Hx     No Known Allergies  Current Outpatient Prescriptions on File Prior to Visit  Medication Sig Dispense Refill  . amoxicillin-clavulanate (AUGMENTIN) 875-125 MG tablet Take 1 tablet by mouth 2 (two) times daily. 20 tablet 0    . brimonidine-timolol (COMBIGAN) 0.2-0.5 % ophthalmic solution Place 1 drop into both eyes 2 (two) times daily.     Marland Kitchen DHEA 50 MG TABS Take 1 tablet by mouth daily.    . feeding supplement, ENSURE ENLIVE, (ENSURE ENLIVE) LIQD Take 237 mLs by mouth 2 (two) times daily between meals. 237 mL 12  . furosemide (LASIX) 20 MG tablet Take 20 mg by mouth.    . latanoprost (XALATAN) 0.005 % ophthalmic solution Place 1 drop into both eyes every evening.   4  . Lutein-Zeaxanthin 15-0.7 MG CAPS Take 1 tablet by mouth daily.    . Melatonin 10 MG TABS Take 10 mg by  mouth at bedtime as needed (for sleep).    . metoprolol succinate (TOPROL XL) 25 MG 24 hr tablet Take 1 tablet (25 mg total) by mouth daily. 30 tablet 6  . sodium chloride (OCEAN) 0.65 % nasal spray Place 2 sprays into the nose as needed for congestion.     . sodium fluoride (DENTA 5000 PLUS) 1.1 % CREA dental cream Apply a thin ribbon of gel to tooth brush. Brush teeth for 2 minutes. Spit out excess. Repeat nightly. (Patient taking differently: Place 1 application onto teeth at bedtime. Apply a thin ribbon of gel to tooth brush. Brush teeth for 2 minutes. Spit out excess. Repeat nightly.) 51 g 0  . traMADol (ULTRAM) 50 MG tablet Take 1 tablet (50 mg total) by mouth every 6 (six) hours as needed for moderate pain. 30 tablet 0  . triamcinolone cream (KENALOG) 0.1 % APPLY TO AFFECTED AREA TWICE DAILY 80 g 2  . zolpidem (AMBIEN) 5 MG tablet TAKE 1 TABLET BY MOUTH AT BEDTIME 30 tablet 0   No current facility-administered medications on file prior to visit.     BP 98/60 (BP Location: Left Arm, Patient Position: Sitting, Cuff Size: Normal)   Pulse 72   Temp 98 F (36.7 C) (Oral)   Ht 5\' 8"  (1.727 m)   Wt 143 lb 3.2 oz (65 kg)   SpO2 98%   BMI 21.77 kg/m     Review of Systems  Constitutional: Negative for appetite change, chills, fatigue and fever.  HENT: Negative for congestion, dental problem, ear pain, hearing loss, sore throat, tinnitus,  trouble swallowing and voice change.   Eyes: Negative for pain, discharge and visual disturbance.  Respiratory: Negative for cough, chest tightness, wheezing and stridor.   Cardiovascular: Positive for leg swelling. Negative for chest pain and palpitations.  Gastrointestinal: Negative for abdominal distention, abdominal pain, blood in stool, constipation, diarrhea, nausea and vomiting.  Genitourinary: Negative for difficulty urinating, discharge, flank pain, genital sores, hematuria and urgency.  Musculoskeletal: Negative for arthralgias, back pain, gait problem, joint swelling, myalgias and neck stiffness.  Skin: Positive for rash.  Neurological: Negative for dizziness, syncope, speech difficulty, weakness, numbness and headaches.  Hematological: Negative for adenopathy. Does not bruise/bleed easily.  Psychiatric/Behavioral: Negative for behavioral problems and dysphoric mood. The patient is not nervous/anxious.        Objective:   Physical Exam  Constitutional: He appears well-developed and well-nourished. No distress.  Musculoskeletal:  Right-sided pedal and ankle edema Much more prominent left lower leg and foot edema Erythema and tenderness.  Much improved          Assessment & Plan:   Resolving cellulitis, left lower leg.  Patient will complete antibiotic therapy. He will consider compression hose.  He does have a component of chronic venous insufficiency and chronic edema Will use furosemide sparingly Attempt to elevate as much as possible and adhere to a restricted salt diet  Will call if unimproved  [name]

## 2017-01-12 NOTE — Patient Instructions (Addendum)
Limit your sodium (Salt) intake  Keep her legs elevated as much as possible Chronic Venous Insufficiency Chronic venous insufficiency, also called venous stasis, is a condition that prevents blood from being pumped effectively through the veins in your legs. Blood may no longer be pumped effectively from the legs back to the heart. This condition can range from mild to severe. With proper treatment, you should be able to continue with an active life. What are the causes? Chronic venous insufficiency occurs when the vein walls become stretched, weakened, or damaged, or when valves within the vein are damaged. Some common causes of this include:  High blood pressure inside the veins (venous hypertension).  Increased blood pressure in the leg veins from long periods of sitting or standing.  A blood clot that blocks blood flow in a vein (deep vein thrombosis, DVT).  Inflammation of a vein (phlebitis) that causes a blood clot to form.  Tumors in the pelvis that cause blood to back up.  What increases the risk? The following factors may make you more likely to develop this condition:  Having a family history of this condition.  Obesity.  Pregnancy.  Living without enough physical activity or exercise (sedentary lifestyle).  Smoking.  Having a job that requires long periods of standing or sitting in one place.  Being a certain age. Women in their 85s and 46s and men in their 31s are more likely to develop this condition.  What are the signs or symptoms? Symptoms of this condition include:  Veins that are enlarged, bulging, or twisted (varicose veins).  Skin breakdown or ulcers.  Reddened or discolored skin on the front of the leg.  Brown, smooth, tight, and painful skin just above the ankle, usually on the inside of the leg (lipodermatosclerosis).  Swelling.  How is this diagnosed? This condition may be diagnosed based on:  Your medical history.  A physical  exam.  Tests, such as: ? A procedure that creates an image of a blood vessel and nearby organs and provides information about blood flow through the blood vessel (duplex ultrasound). ? A procedure that tests blood flow (plethysmography). ? A procedure to look at the veins using X-ray and dye (venogram).  How is this treated? The goals of treatment are to help you return to an active life and to minimize pain or disability. Treatment depends on the severity of your condition, and it may include:  Wearing compression stockings. These can help relieve symptoms and help prevent your condition from getting worse. However, they do not cure the condition.  Sclerotherapy. This is a procedure involving an injection of a material that "dissolves" damaged veins.  Surgery. This may involve: ? Removing a diseased vein (vein stripping). ? Cutting off blood flow through the vein (laser ablation surgery). ? Repairing a valve.  Follow these instructions at home:  Wear compression stockings as told by your health care provider. These stockings help to prevent blood clots and reduce swelling in your legs.  Take over-the-counter and prescription medicines only as told by your health care provider.  Stay active by exercising, walking, or doing different activities. Ask your health care provider what activities are safe for you and how much exercise you need.  Drink enough fluid to keep your urine clear or pale yellow.  Do not use any products that contain nicotine or tobacco, such as cigarettes and e-cigarettes. If you need help quitting, ask your health care provider.  Keep all follow-up visits as told by your  health care provider. This is important. Contact a health care provider if:  You have redness, swelling, or more pain in the affected area.  You see a red streak or line that extends up or down from the affected area.  You have skin breakdown or a loss of skin in the affected area, even if  the breakdown is small.  You get an injury in the affected area. Get help right away if:  You get an injury and an open wound in the affected area.  You have severe pain that does not get better with medicine.  You have sudden numbness or weakness in the foot or ankle below the affected area, or you have trouble moving your foot or ankle.  You have a fever and you have worse or persistent symptoms.  You have chest pain.  You have shortness of breath. Summary  Chronic venous insufficiency, also called venous stasis, is a condition that prevents blood from being pumped effectively through the veins in your legs.  Chronic venous insufficiency occurs when the vein walls become stretched, weakened, or damaged, or when valves within the vein are damaged.  Treatment for this condition depends on how severe your condition is, and it may involve wearing compression stockings or having a procedure.  Make sure you stay active by exercising, walking, or doing different activities. Ask your health care provider what activities are safe for you and how much exercise you need. This information is not intended to replace advice given to you by your health care provider. Make sure you discuss any questions you have with your health care provider. Document Released: 10/13/2006 Document Revised: 04/28/2016 Document Reviewed: 04/28/2016 Elsevier Interactive Patient Education  2017 [employer].

## 2017-01-14 DIAGNOSIS — H353212 Exudative age-related macular degeneration, right eye, with inactive choroidal neovascularization: Secondary | ICD-10-CM | POA: Diagnosis not present

## 2017-01-14 DIAGNOSIS — H353133 Nonexudative age-related macular degeneration, bilateral, advanced atrophic without subfoveal involvement: Secondary | ICD-10-CM | POA: Diagnosis not present

## 2017-01-14 DIAGNOSIS — H43813 Vitreous degeneration, bilateral: Secondary | ICD-10-CM | POA: Diagnosis not present

## 2017-01-14 DIAGNOSIS — H35371 Puckering of macula, right eye: Secondary | ICD-10-CM | POA: Diagnosis not present

## 2017-01-27 ENCOUNTER — Other Ambulatory Visit: Payer: Self-pay | Admitting: Internal Medicine

## 2017-02-24 ENCOUNTER — Other Ambulatory Visit: Payer: Self-pay | Admitting: Internal Medicine

## 2017-02-26 ENCOUNTER — Encounter: Payer: Self-pay | Admitting: Internal Medicine

## 2017-02-26 ENCOUNTER — Ambulatory Visit (INDEPENDENT_AMBULATORY_CARE_PROVIDER_SITE_OTHER): Payer: Medicare Other | Admitting: Internal Medicine

## 2017-02-26 VITALS — BP 108/62 | HR 91 | Temp 98.2°F | Ht 68.0 in | Wt 142.4 lb

## 2017-02-26 DIAGNOSIS — H6981 Other specified disorders of Eustachian tube, right ear: Secondary | ICD-10-CM | POA: Diagnosis not present

## 2017-02-26 DIAGNOSIS — I10 Essential (primary) hypertension: Secondary | ICD-10-CM | POA: Diagnosis not present

## 2017-02-26 DIAGNOSIS — J310 Chronic rhinitis: Secondary | ICD-10-CM | POA: Diagnosis not present

## 2017-02-26 DIAGNOSIS — E119 Type 2 diabetes mellitus without complications: Secondary | ICD-10-CM | POA: Diagnosis not present

## 2017-02-26 DIAGNOSIS — C062 Malignant neoplasm of retromolar area: Secondary | ICD-10-CM | POA: Diagnosis not present

## 2017-02-26 DIAGNOSIS — R600 Localized edema: Secondary | ICD-10-CM | POA: Diagnosis not present

## 2017-02-26 DIAGNOSIS — I87322 Chronic venous hypertension (idiopathic) with inflammation of left lower extremity: Secondary | ICD-10-CM

## 2017-02-26 DIAGNOSIS — R42 Dizziness and giddiness: Secondary | ICD-10-CM | POA: Diagnosis not present

## 2017-02-26 DIAGNOSIS — K222 Esophageal obstruction: Secondary | ICD-10-CM | POA: Diagnosis not present

## 2017-02-26 DIAGNOSIS — R220 Localized swelling, mass and lump, head: Secondary | ICD-10-CM | POA: Diagnosis not present

## 2017-02-26 MED ORDER — FUROSEMIDE 40 MG PO TABS: 40.0000 mg | ORAL_TABLET | Freq: Two times a day (BID) | ORAL | 2 refills | Status: DC

## 2017-02-26 MED ORDER — TRIAMCINOLONE ACETONIDE 0.1 % EX CREA: TOPICAL_CREAM | Freq: Two times a day (BID) | CUTANEOUS | 2 refills | Status: DC

## 2017-02-26 NOTE — Patient Instructions (Addendum)
Stasis Dermatitis Stasis dermatitis is a long-term (chronic) skin condition that happens when veins can no longer pump blood back to the heart (poor circulation). This condition causes a red or brown scaly rash or sores (ulcers) from the pooling of blood (stasis). This condition usually affects the lower legs. It may affect one leg or both legs. Without treatment, severe stasis dermatitis can lead to other skin conditions and infections. What are the causes? This condition is caused by poor circulation. What increases the risk? This condition is more likely to develop in people who:  Are not very active.  Stand for long periods of time.  Have veins that have become enlarged and twisted (varicose veins).  Have leg veins that are not strong enough to send blood back to the heart (venous insufficiency).  Have had a blood clot.  Have been pregnant many times.  Have had vein surgery.  Are obese.  Have heart or kidney failure.  Are 50 years of age or older.  What are the signs or symptoms? Common early symptoms of this condition include:  Swelling in your ankle or leg. This might get better overnight but be worse again in the day.  Skin that looks thin on your ankle and leg.  Brown marks that develop slowly.  Skin that is easily irritated or cracked.  Red, swollen skin.  An achy or heavy feeling after you walk or stand for long periods of time.  Pain.  Later and more severe symptoms of this condition include:  Skin that looks shiny.  Small, open sores (ulcers). These are often red or purple.  Dry, cracking skin.  Skin that feels hard.  Severe itching.  A change in the shape or color of your lower legs.  Severe pain.  Difficulty walking.  How is this diagnosed? Your health care provider may suspect this condition from your symptoms and medical history. Your health care provider will also do a physical exam. You may need to see a health care provider who  specializes in skin diseases (dermatologist). You may also have tests to confirm the diagnosis, including:  Blood tests.  Imaging studies to check blood flow (Doppler ultrasound).  Allergy tests.  How is this treated? Treatment for this condition may include medicine, such as:  Corticosteroid creams and ointments.  Non-corticosteroid medicines applied to the skin (topical).  Medicine to reduce swelling in the legs (diuretics).  Antibiotics.  Medicine to relieve itching (antihistamines).  You may also have to wear:  Compression stockings or an elastic wrap to improve circulation.  A bandage (dressing).  A wrap that contains zinc and gelatin (Unna boot).  Follow these instructions at home: Skin Care  Moisturize your skin as told by your health care provider. Do not use moisturizers with fragrance. This can irritate your skin.  Apply cool compresses to the affected areas.  Do not scratch your skin.  Do not rub your skin dry after a bath or shower. Gently pat your skin dry.  Do not use scented soaps, detergents, or perfumes. Medicines  Take or use over-the-counter and prescription medicines only as told by your health care provider.  If you were prescribed an antibiotic medicine, take or use it as told by your health care provider. Do not stop taking or using the antibiotic even if your condition starts to improve. Lifestyle  Do not stand or sit in one position for long periods of time.  Do not cross your legs when you sit.  Raise (elevate) your   legs above the level of your heart when you are sitting or lying down.  Walk as told by your health care provider. Walking increases blood flow.  Wear comfortable, loose-fitting clothing. Circulation in your legs will be worse if you wear tight pants, belts, and waistbands. General instructions  Change and remove any dressing as told by your health care provider, if this applies.  Wear compression stockings as told by  your health care provider, if this applies. These stockings help to prevent blood clots and reduce swelling in your legs.  Wear the Unna boot as told by your health care provider, if this applies.  Keep all follow-up visits as told by your health care provider. This is important. Contact a health care provider if:  Your condition does not improve with treatment.  Your condition gets worse.  You have signs of infection in the affected area. Watch for: ? Swelling. ? Tenderness. ? Redness. ? Soreness. ? Warmth.  You have a fever. Get help right away if:  You notice red streaks coming from the affected area.  Your bone or joint underneath the affected area becomes painful after the skin has healed.  The affected area turns darker.  You feel a deep pain in your leg or groin.  You are short of breath. This information is not intended to replace advice given to you by your health care provider. Make sure you discuss any questions you have with your health care provider. Document Released: 09/18/2005 Document Revised: 02/05/2016 Document Reviewed: 10/25/2014 Elsevier Interactive Patient Education  2018 [employer] Inc[employer]  

## 2017-02-26 NOTE — Progress Notes (Signed)
Subjective:    Patient ID: Timothy Kim, male    DOB: 01-01-1930, 83 y.o.   MRN: [id number]  HPI Lab Results  Component Value Date   HGBA1C 5.3 09/18/2015   83 year old patient who has essential hypertension.  He has a history of chronic lower extremity edema.  Over the past several days he has had increasing swelling as well as dry flaky skin involving the distal left lower leg. He does have compression hose that he uses very infrequently due to the difficulty of placing.  He has been on furosemide 60 mg daily in divided dosages.  His hypertension has been stable.  He has a history also diet-controlled diabetes  Past Medical History:  Diagnosis Date  . Alcohol abuse   . Allergic rhinitis   . Anemia in neoplastic disease    chronic  . Bilateral lower extremity edema    feet  . Borderline diabetes   . BPH (benign prostatic hypertrophy)   . CLL (chronic lymphocytic leukemia) [facility]) oncologist-  dr Heath Lark ([facility])   dx Jan 2014 --- Stage 1--  currently asymptomatic as of Apr 2016 and No active disease  . DDD (degenerative disc disease), cervical   . DDD (degenerative disc disease), lumbosacral   . 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 Dysphasia functional--  speech therapy   post op  radical neck dissection 07-21-2014--  changed diet to no chewy food , softer foods , states with the changes swallowed okay without any issues  . Glaucoma    both eyes  . History of gastroesophageal reflux (GERD)   . History of kidney stones    right side--  per CT from [facility] 01-17-2015 non-obstructing  . History of radiation therapy    09-04-2014 to 10-13-2014  Right retromolar region (tumor bed) and right neck/  60Gy in 30 fractions to tumor bed and 54Gy in 30 fractions to neck  . History  of radiation to head and neck region 09-04-2014 to  10-13-2014   right retromolar region and right neck  60 Gy  . History of syncope    a. Event monitor 11/17: No evidence of significant AVB or > 3 second pause.; plan ILR if recurrent syncope in the future  . History of tracheostomy    post op radical neck dissection  07-21-2014--  post op tracheostomy on 07-25-2014  decannulated and sutured 07-29-2014  . Hypertension   . Macular degeneration    right eye only  . MRSA (methicillin resistant staph aureus) culture positive   . OA (osteoarthritis)   . Organic impotence   . Pneumonia 12/2015  . Pulmonary nodule    benign  and stable per last CT  . Radiation-induced dermatitis   . Squamous cell carcinoma of retromolar trigone [facility]) oncologist-  dr Isidore Moos   Stage IVB, pT1b, pN0, Grade 2, +PNI, no LVSI---  S/P  RADICAL NECK DISSECTION AND RADIATION  . Thoracic ascending aortic aneurysm ([place]) 03/28/2016   Chest CTA 9/17: 4.6 cm fusiform ascending thoracic aortic aneurysm // Chest CTA 4/18: Ascending thoracic aortic aneurysm 4.2 cm >> repeat 1 year  . 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 Transitional cell carcinoma of bladder [facility]) urologist-- dr Junious Silk   High - grade  .  Venous insufficiency      Social History   Social History  . Marital status: Single    Spouse name: N/A  . Number of children: 2  . Years of education: N/A   Occupational History  . Not on file.   Social History Main Topics  . Smoking status: Never Smoker  . Smokeless tobacco: Never Used  . Alcohol use Yes     Comment: vodka 3-4 daily   (Hx alcohol withdrawal )  . Drug use: No  . Sexual activity: Not on file   Other Topics Concern  . Not on file   Social History Narrative   Patient is divorced with 2 children.   Patient has never smoked. Patient has never used smokeless tobacco.    Patient drinks gin or vodka on a daily basis.   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 CATARACT EXTRACTION W/ INTRAOCULAR LENS  IMPLANT, BILATERAL    . COLONOSCOPY N/A 07/23/2016   Procedure: COLONOSCOPY;  Surgeon: Wilford Corner, MD;  Location: [facility] ENDOSCOPY;  Service: Endoscopy;  Laterality: N/A;  . CYSTOSCOPY N/A 02/02/2015   Procedure: CYSTOSCOPY, INSTILLATION OF MITOMYCIN C;  Surgeon: Lowella Bandy, MD;  Location: [facility];  Service: Urology;  Laterality: N/A;  . CYSTOSCOPY WITH BIOPSY N/A 03/27/2015   Procedure: CYSTOSCOPY WITH BLADDER  BIOPSY WITH FULGERATION;  Surgeon: Festus Aloe, MD;  Location: [facility];  Service: Urology;  Laterality: N/A;  . ESOPHAGOGASTRODUODENOSCOPY N/A 07/23/2016   Procedure: ESOPHAGOGASTRODUODENOSCOPY (EGD);  Surgeon: Wilford Corner, MD;  Location: [facility] ENDOSCOPY;  Service: Endoscopy;  Laterality: N/A;  . INGUINAL HERNIA REPAIR Bilateral right 1988//  left 1970's  . LAMINECTOMY WITH POSTERIOR LATERAL ARTHRODESIS LEVEL 1  09-01-2003   L3 -5 LAMINECTOMY W/ DECOMPRESSION AND FUSION L4-5  . MAXILLECTOMY Right 07/21/14,   [facility]   Radical Neck Dissection, Maxillectomy, Right Marginal Mandibulectomy, dental extractions, Parascauplar fasciocutaeous Free flap reconstruction  . RETINAL DETACHMENT REPAIR W/ SCLERAL BUCKLE LE  11-03-2000  . ROTATOR CUFF REPAIR Right 12-11-2000  . tonsil biopsy Right 05/24/14  . TRANSURETHRAL RESECTION OF BLADDER TUMOR N/A 02/02/2015   Procedure: TRANSURETHRAL RESECTION OF BLADDER TUMOR (TURBT);  Surgeon: Lowella Bandy, MD;  Location: [facility];  Service: Urology;  Laterality: N/A;    Family History  Problem Relation Age of Onset  . Cancer Mother        lung ca  . Alcohol abuse Sister   . Hypertension Neg Hx        family hx  . Sudden death Neg Hx        famiylhx  . Heart attack Neg Hx     No Known  Allergies  Current Outpatient Prescriptions on File Prior to Visit  Medication Sig Dispense Refill  . brimonidine-timolol (COMBIGAN) 0.2-0.5 % ophthalmic solution Place 1 drop into both eyes 2 (two) times daily.     Marland Kitchen DHEA 50 MG TABS Take 1 tablet by mouth daily.    . feeding supplement, ENSURE ENLIVE, (ENSURE ENLIVE) LIQD Take 237 mLs by mouth 2 (two) times daily between meals. 237 mL 12  . latanoprost (XALATAN) 0.005 % ophthalmic solution Place 1 drop into both eyes every evening.   4  . Lutein-Zeaxanthin 15-0.7 MG CAPS Take 1 tablet by mouth daily.    Marland Kitchen  Melatonin 10 MG TABS Take 10 mg by mouth at bedtime as needed (for sleep).    . metoprolol succinate (TOPROL XL) 25 MG 24 hr tablet Take 1 tablet (25 mg total) by mouth daily. 30 tablet 6  . sodium chloride (OCEAN) 0.65 % nasal spray Place 2 sprays into the nose as needed for congestion.     . sodium fluoride (DENTA 5000 PLUS) 1.1 % CREA dental cream Apply a thin ribbon of gel to tooth brush. Brush teeth for 2 minutes. Spit out excess. Repeat nightly. (Patient taking differently: Place 1 application onto teeth at bedtime. Apply a thin ribbon of gel to tooth brush. Brush teeth for 2 minutes. Spit out excess. Repeat nightly.) 51 g 0  . traMADol (ULTRAM) 50 MG tablet Take 1 tablet (50 mg total) by mouth every 6 (six) hours as needed for moderate pain. 30 tablet 0  . zolpidem (AMBIEN) 5 MG tablet TAKE 1 TABLET BY MOUTH AT BEDTIME 30 tablet 0   No current facility-administered medications on file prior to visit.     BP 108/62 (BP Location: Left Arm, Patient Position: Sitting, Cuff Size: Normal)   Pulse 91   Temp 98.2 F (36.8 C) (Oral)   Ht 5\' 8"  (1.727 m)   Wt 142 lb 6.4 oz (64.6 kg)   SpO2 93%   BMI 21.65 kg/m       Review of Systems  Constitutional: Negative for appetite change, chills, fatigue and fever.  HENT: Negative for congestion, dental problem, ear pain, hearing loss, sore throat, tinnitus, trouble swallowing and voice  change.   Eyes: Negative for pain, discharge and visual disturbance.  Respiratory: Negative for cough, chest tightness, wheezing and stridor.   Cardiovascular: Positive for leg swelling. Negative for chest pain and palpitations.  Gastrointestinal: Negative for abdominal distention, abdominal pain, blood in stool, constipation, diarrhea, nausea and vomiting.  Genitourinary: Negative for difficulty urinating, discharge, flank pain, genital sores, hematuria and urgency.  Musculoskeletal: Negative for arthralgias, back pain, gait problem, joint swelling, myalgias and neck stiffness.  Skin: Positive for rash.  Neurological: Negative for dizziness, syncope, speech difficulty, weakness, numbness and headaches.  Hematological: Negative for adenopathy. Does not bruise/bleed easily.  Psychiatric/Behavioral: Negative for behavioral problems and dysphoric mood. The patient is not nervous/anxious.        Objective:   Physical Exam  Constitutional: He appears well-developed and well-nourished. No distress.  Skin:  Plus 2 edema involving both lower legs, left greater than right Considerable dry flaky skin with mild erythema involving the left lower leg          Assessment & Plan:   Bilateral lower extremity edema Stasis dermatitis, left lower leg  We'll increase furosemide slightly Local skin care discussed Triamcinolone ordered Moisturizing creams to be applied twice daily He will attempt to elevate and use elastic stocking  Follow-up one month  [name]

## 2017-03-12 ENCOUNTER — Encounter: Payer: Self-pay | Admitting: Internal Medicine

## 2017-03-26 ENCOUNTER — Ambulatory Visit (INDEPENDENT_AMBULATORY_CARE_PROVIDER_SITE_OTHER): Payer: Medicare Other | Admitting: Internal Medicine

## 2017-03-26 ENCOUNTER — Other Ambulatory Visit: Payer: Self-pay | Admitting: Internal Medicine

## 2017-03-26 ENCOUNTER — Encounter: Payer: Self-pay | Admitting: Internal Medicine

## 2017-03-26 VITALS — BP 96/64 | HR 80 | Temp 98.0°F | Wt 141.0 lb

## 2017-03-26 DIAGNOSIS — D630 Anemia in neoplastic disease: Secondary | ICD-10-CM

## 2017-03-26 DIAGNOSIS — I10 Essential (primary) hypertension: Secondary | ICD-10-CM

## 2017-03-26 DIAGNOSIS — R531 Weakness: Secondary | ICD-10-CM

## 2017-03-26 DIAGNOSIS — C9190 Lymphoid leukemia, unspecified not having achieved remission: Secondary | ICD-10-CM | POA: Diagnosis not present

## 2017-03-26 DIAGNOSIS — R600 Localized edema: Secondary | ICD-10-CM | POA: Diagnosis not present

## 2017-03-26 DIAGNOSIS — C9110 Chronic lymphocytic leukemia of B-cell type not having achieved remission: Secondary | ICD-10-CM

## 2017-03-26 DIAGNOSIS — Z23 Encounter for immunization: Secondary | ICD-10-CM

## 2017-03-26 DIAGNOSIS — E119 Type 2 diabetes mellitus without complications: Secondary | ICD-10-CM | POA: Diagnosis not present

## 2017-03-26 LAB — CBC WITH DIFFERENTIAL/PLATELET
BASOS PCT: 0.5 % (ref 0.0–3.0)
Basophils Absolute: 0 10*3/uL (ref 0.0–0.1)
EOS PCT: 1.3 % (ref 0.0–5.0)
Eosinophils Absolute: 0.1 10*3/uL (ref 0.0–0.7)
HEMATOCRIT: 37 % — AB (ref 39.0–52.0)
Hemoglobin: 12.9 g/dL — ABNORMAL LOW (ref 13.0–17.0)
LYMPHS PCT: 35 % (ref 12.0–46.0)
Lymphs Abs: 3.5 10*3/uL (ref 0.7–4.0)
MCHC: 34.8 g/dL (ref 30.0–36.0)
MCV: 103.8 fl — AB (ref 78.0–100.0)
MONOS PCT: 11.1 % (ref 3.0–12.0)
Monocytes Absolute: 1.1 10*3/uL — ABNORMAL HIGH (ref 0.1–1.0)
NEUTROS ABS: 5.2 10*3/uL (ref 1.4–7.7)
Neutrophils Relative %: 52.1 % (ref 43.0–77.0)
PLATELETS: 183 10*3/uL (ref 150.0–400.0)
RBC: 3.56 Mil/uL — ABNORMAL LOW (ref 4.22–5.81)
RDW: 13.4 % (ref 11.5–15.5)
WBC: 10.1 10*3/uL (ref 4.0–10.5)

## 2017-03-26 LAB — COMPREHENSIVE METABOLIC PANEL
ALT: 14 U/L (ref 0–53)
AST: 29 U/L (ref 0–37)
Albumin: 3.8 g/dL (ref 3.5–5.2)
Alkaline Phosphatase: 72 U/L (ref 39–117)
BUN: 33 mg/dL — ABNORMAL HIGH (ref 6–23)
CALCIUM: 9.6 mg/dL (ref 8.4–10.5)
CHLORIDE: 82 meq/L — AB (ref 96–112)
CO2: 41 mEq/L — ABNORMAL HIGH (ref 19–32)
CREATININE: 1.06 mg/dL (ref 0.40–1.50)
GFR: 70.59 mL/min (ref >60.00)
Glucose, Bld: 116 mg/dL — ABNORMAL HIGH (ref 70–99)
Potassium: 3.3 mEq/L — ABNORMAL LOW (ref 3.5–5.1)
Sodium: 132 mEq/L — ABNORMAL LOW (ref 135–145)
Total Bilirubin: 0.8 mg/dL (ref 0.2–1.2)
Total Protein: 7.2 g/dL (ref 6.0–8.3)

## 2017-03-26 LAB — HEMOGLOBIN A1C: Hgb A1c MFr Bld: 5.7 % (ref 4.6–6.5)

## 2017-03-26 LAB — TSH: TSH: 2.55 u[IU]/mL (ref 0.35–4.50)

## 2017-03-26 MED ORDER — FUROSEMIDE 40 MG PO TABS: 40.0000 mg | ORAL_TABLET | Freq: Every day | ORAL | 2 refills | Status: DC

## 2017-03-26 NOTE — Patient Instructions (Signed)
Limit your sodium (Salt) intake  Keep legs elevated as much as possible  Use support hose  Continue local skin care  Decrease furosemide to 40 mg once daily in the morning  Metoprolol 1 half tablet daily for one week, then 1 half tablet every other day for 1 week, then discontinue

## 2017-03-26 NOTE — Progress Notes (Signed)
Subjective:    Patient ID: Timothy Kim, male    DOB: 07-06-1929, 83 y.o.   MRN: [id number]  HPI  83 year old patient who is seen today for follow-up.  He was seen 1 month ago with worsening lower extremity edema with stasis dermatitis Furosemide was increased to 80 mg twice daily.  His edema and dry flaky skin has improved dramatically Remains quite weak with some orthostatic dizziness Blood pressure today 80 over 60 sitting  Past Medical History:  Diagnosis Date  . Alcohol abuse   . Allergic rhinitis   . Anemia in neoplastic disease    chronic  . Bilateral lower extremity edema    feet  . Borderline diabetes   . BPH (benign prostatic hypertrophy)   . CLL (chronic lymphocytic leukemia) [facility]) oncologist-  dr Heath Lark ([facility])   dx Jan 2014 --- Stage 1--  currently asymptomatic as of Apr 2016 and No active disease  . DDD (degenerative disc disease), cervical   . DDD (degenerative disc disease), lumbosacral   . 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 Dysphasia functional--  speech therapy   post op  radical neck dissection 07-21-2014--  changed diet to no chewy food , softer foods , states with the changes swallowed okay without any issues  . Glaucoma    both eyes  . History of gastroesophageal reflux (GERD)   . History of kidney stones    right side--  per CT from [facility] 01-17-2015 non-obstructing  . History of radiation therapy    09-04-2014 to 10-13-2014  Right retromolar region (tumor bed) and right neck/  60Gy in 30 fractions to tumor bed and 54Gy in 30 fractions to neck  . History of radiation to head and neck region 09-04-2014 to  10-13-2014   right retromolar region and right neck  60 Gy  . History of syncope    a. Event monitor 11/17: No evidence of significant AVB  or > 3 second pause.; plan ILR if recurrent syncope in the future  . History of tracheostomy    post op radical neck dissection  07-21-2014--  post op tracheostomy on 07-25-2014  decannulated and sutured 07-29-2014  . Hypertension   . Macular degeneration    right eye only  . MRSA (methicillin resistant staph aureus) culture positive   . OA (osteoarthritis)   . Organic impotence   . Pneumonia 12/2015  . Pulmonary nodule    benign  and stable per last CT  . Radiation-induced dermatitis   . Squamous cell carcinoma of retromolar trigone [facility]) oncologist-  dr Isidore Moos   Stage IVB, pT1b, pN0, Grade 2, +PNI, no LVSI---  S/P  RADICAL NECK DISSECTION AND RADIATION  . Thoracic ascending aortic aneurysm ([place]) 03/28/2016   Chest CTA 9/17: 4.6 cm fusiform ascending thoracic aortic aneurysm // Chest CTA 4/18: Ascending thoracic aortic aneurysm 4.2 cm >> repeat 1 year  . 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 Transitional cell carcinoma of bladder [facility]) urologist-- dr Junious Silk   High - grade  . Venous insufficiency      Social History   Social History  . Marital status: Single    Spouse name: N/A  . Number of children: 2  . Years of education: N/A  Occupational History  . Not on file.   Social History Main Topics  . Smoking status: Never Smoker  . Smokeless tobacco: Never Used  . Alcohol use Yes     Comment: vodka 3-4 daily   (Hx alcohol withdrawal )  . Drug use: No  . Sexual activity: Not on file   Other Topics Concern  . Not on file   Social History Narrative   Patient is divorced with 2 children.   Patient has never smoked. Patient has never used smokeless tobacco.    Patient drinks gin or vodka on a daily basis.   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 CATARACT EXTRACTION W/  INTRAOCULAR LENS  IMPLANT, BILATERAL    . COLONOSCOPY N/A 07/23/2016   Procedure: COLONOSCOPY;  Surgeon: Wilford Corner, MD;  Location: [facility] ENDOSCOPY;  Service: Endoscopy;  Laterality: N/A;  . CYSTOSCOPY N/A 02/02/2015   Procedure: CYSTOSCOPY, INSTILLATION OF MITOMYCIN C;  Surgeon: Lowella Bandy, MD;  Location: [facility];  Service: Urology;  Laterality: N/A;  . CYSTOSCOPY WITH BIOPSY N/A 03/27/2015   Procedure: CYSTOSCOPY WITH BLADDER  BIOPSY WITH FULGERATION;  Surgeon: Festus Aloe, MD;  Location[facility];  Service: Urology;  Laterality: N/A;  . ESOPHAGOGASTRODUODENOSCOPY N/A 07/23/2016   Procedure: ESOPHAGOGASTRODUODENOSCOPY (EGD);  Surgeon: Wilford Corner, MD;  Location: [facility] ENDOSCOPY;  Service: Endoscopy;  Laterality: N/A;  . INGUINAL HERNIA REPAIR Bilateral right 1988//  left 1970's  . LAMINECTOMY WITH POSTERIOR LATERAL ARTHRODESIS LEVEL 1  09-01-2003   L3 -5 LAMINECTOMY W/ DECOMPRESSION AND FUSION L4-5  . MAXILLECTOMY Right 07/21/14,   [facility]   Radical Neck Dissection, Maxillectomy, Right Marginal Mandibulectomy, dental extractions, Parascauplar fasciocutaeous Free flap reconstruction  . RETINAL DETACHMENT REPAIR W/ SCLERAL BUCKLE LE  11-03-2000  . ROTATOR CUFF REPAIR Right 12-11-2000  . tonsil biopsy Right 05/24/14  . TRANSURETHRAL RESECTION OF BLADDER TUMOR N/A 02/02/2015   Procedure: TRANSURETHRAL RESECTION OF BLADDER TUMOR (TURBT);  Surgeon: Lowella Bandy, MD;  Location: [facility];  Service: Urology;  Laterality: N/A;    Family History  Problem Relation Age of Onset  . Cancer Mother        lung ca  . Alcohol abuse Sister   . Hypertension Neg Hx        family hx  . Sudden death Neg Hx        famiylhx  . Heart attack Neg Hx     No Known Allergies  Current Outpatient Prescriptions on File Prior to Visit  Medication Sig Dispense Refill  . brimonidine-timolol (COMBIGAN) 0.2-0.5 % ophthalmic solution Place 1 drop into both eyes 2  (two) times daily.     Marland Kitchen DHEA 50 MG TABS Take 1 tablet by mouth daily.    . feeding supplement, ENSURE ENLIVE, (ENSURE ENLIVE) LIQD Take 237 mLs by mouth 2 (two) times daily between meals. 237 mL 12  . furosemide (LASIX) 40 MG tablet Take 1 tablet (40 mg total) by mouth 2 (two) times daily. 180 tablet 2  . latanoprost (XALATAN) 0.005 % ophthalmic solution Place 1 drop into both eyes every evening.   4  . Lutein-Zeaxanthin 15-0.7 MG CAPS Take 1 tablet by mouth daily.    . Melatonin 10 MG TABS Take 10 mg by mouth at bedtime as needed (for sleep).    Marland Kitchen  metoprolol succinate (TOPROL XL) 25 MG 24 hr tablet Take 1 tablet (25 mg total) by mouth daily. 30 tablet 6  . sodium chloride (OCEAN) 0.65 % nasal spray Place 2 sprays into the nose as needed for congestion.     . sodium fluoride (DENTA 5000 PLUS) 1.1 % CREA dental cream Apply a thin ribbon of gel to tooth brush. Brush teeth for 2 minutes. Spit out excess. Repeat nightly. (Patient taking differently: Place 1 application onto teeth at bedtime. Apply a thin ribbon of gel to tooth brush. Brush teeth for 2 minutes. Spit out excess. Repeat nightly.) 51 g 0  . traMADol (ULTRAM) 50 MG tablet Take 1 tablet (50 mg total) by mouth every 6 (six) hours as needed for moderate pain. 30 tablet 0  . triamcinolone cream (KENALOG) 0.1 % Apply topically 2 (two) times daily. 453.6 g 2  . zolpidem (AMBIEN) 5 MG tablet TAKE 1 TABLET BY MOUTH AT BEDTIME 30 tablet 0   No current facility-administered medications on file prior to visit.     BP 96/64 (BP Location: Left Arm, Patient Position: Sitting, Cuff Size: Normal)   Pulse 80   Temp 98 F (36.7 C) (Oral)   Wt 141 lb (64 kg)   SpO2 95%   BMI 21.44 kg/m     Review of Systems  Constitutional: Positive for activity change and appetite change.  Neurological: Positive for dizziness, weakness and light-headedness.       Objective:   Physical Exam  Constitutional: He is oriented to person, place, and time. He  appears well-developed. No distress.   No distress Alert Blood pressure on arrival 96 over 64  Repeat blood pressure 80/60  HENT:  Head: Normocephalic.  Right Ear: External ear normal.  Left Ear: External ear normal.  Eyes: Conjunctivae and EOM are normal.  Neck: Normal range of motion.  Cardiovascular: Normal rate.   Murmur heard. Grade 3/6 systolic murmur  Pulmonary/Chest: Effort normal and breath sounds normal. No respiratory distress. He has no wheezes. He has no rales.  O2 saturation 95  Abdominal: Bowel sounds are normal.  Musculoskeletal: Normal range of motion. He exhibits edema. He exhibits no tenderness.  Plus 1 edema with some mild stasis changes. The dry flaky skin has larger resolved  Neurological: He is alert and oriented to person, place, and time.  Psychiatric: He has a normal mood and affect. His behavior is normal.          Assessment & Plan:   We'll decrease furosemide to 40 mg once daily Continue low-salt diet and elevation Continue support stockings  Taper and discontinue beta blocker therapy Consider physical therapy Check lab  [name]

## 2017-04-14 DIAGNOSIS — C062 Malignant neoplasm of retromolar area: Secondary | ICD-10-CM | POA: Diagnosis not present

## 2017-04-14 DIAGNOSIS — J310 Chronic rhinitis: Secondary | ICD-10-CM | POA: Diagnosis not present

## 2017-04-14 DIAGNOSIS — H6981 Other specified disorders of Eustachian tube, right ear: Secondary | ICD-10-CM | POA: Diagnosis not present

## 2017-04-26 ENCOUNTER — Other Ambulatory Visit: Payer: Self-pay | Admitting: Internal Medicine

## 2017-04-29 ENCOUNTER — Other Ambulatory Visit: Payer: Self-pay | Admitting: Internal Medicine

## 2017-05-07 ENCOUNTER — Encounter: Payer: Self-pay | Admitting: Internal Medicine

## 2017-05-07 ENCOUNTER — Ambulatory Visit (INDEPENDENT_AMBULATORY_CARE_PROVIDER_SITE_OTHER): Payer: Medicare Other | Admitting: Internal Medicine

## 2017-05-07 VITALS — BP 110/60 | HR 76 | Temp 97.4°F | Ht 68.0 in | Wt 143.4 lb

## 2017-05-07 DIAGNOSIS — E119 Type 2 diabetes mellitus without complications: Secondary | ICD-10-CM

## 2017-05-07 DIAGNOSIS — I10 Essential (primary) hypertension: Secondary | ICD-10-CM

## 2017-05-07 DIAGNOSIS — C062 Malignant neoplasm of retromolar area: Secondary | ICD-10-CM

## 2017-05-07 MED ORDER — ZOLPIDEM TARTRATE 5 MG PO TABS: 5.0000 mg | ORAL_TABLET | Freq: Every day | ORAL | 0 refills | Status: DC

## 2017-05-07 NOTE — Progress Notes (Signed)
Subjective:    Patient ID: Timothy Kim, male    DOB: 12-20-1929, 83 y.o.   MRN: [id number]  HPI  Lab Results  Component Value Date   HGBA1C 5.7 03/26/2017   Wt Readings from Last 3 Encounters:  05/07/17 143 lb 6.4 oz (65 kg)  03/26/17 141 lb (64 kg)  02/26/17 142 lb 6.4 oz (62.81 kg)   83 year old patient seen today in follow-up. 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  He is scheduled for follow-up soon But he does have a prior history of syncope and was seen last month with orthostatic hypotension.  Metoprolol was tapered and discontinued and furosemide was decreased to 40 mg once a day.  There is been no weight gain or significant increase in lower extremity edema He continues to have some balance issues in part related to dizziness.  This has been present for 7-8 months other complaints include poor sleep and numbness involving the feet. His peripheral edema has been stable. He is scheduled for oncology follow-up in the spring  Past Medical History:  Diagnosis Date  . Alcohol abuse   . Allergic rhinitis   . Anemia in neoplastic disease    chronic  . Bilateral lower extremity edema    feet  . Borderline diabetes   . BPH (benign prostatic hypertrophy)   . CLL (chronic lymphocytic leukemia) [facility]) oncologist-  dr Heath Lark ([facility])   dx Jan 2014 --- Stage 1--  currently asymptomatic as of Apr 2016 and No active disease  . DDD (degenerative disc disease), cervical   . DDD (degenerative disc disease), lumbosacral   . 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 Dysphasia functional--  speech therapy   post op  radical neck dissection 07-21-2014--  changed diet to no chewy food , softer foods , states with the changes swallowed okay  without any issues  . Glaucoma    both eyes  . History of gastroesophageal reflux (GERD)   . History of kidney stones    right side--  per CT from [facility] 01-17-2015 non-obstructing  . History of radiation therapy    09-04-2014 to 10-13-2014  Right retromolar region (tumor bed) and right neck/  60Gy in 30 fractions to tumor bed and 54Gy in 30 fractions to neck  . History of radiation to head and neck region 09-04-2014 to  10-13-2014   right retromolar region and right neck  60 Gy  . History of syncope    a. Event monitor 11/17: No evidence of significant AVB or > 3 second pause.; plan ILR if recurrent syncope in the future  . History of tracheostomy    post op radical neck dissection  07-21-2014--  post op tracheostomy on 07-25-2014  decannulated and sutured 07-29-2014  . Hypertension   . Macular degeneration    right eye only  . MRSA (methicillin resistant staph aureus) culture positive   . OA (osteoarthritis)   . Organic impotence   . Pneumonia 12/2015  . Pulmonary nodule    benign  and stable per last CT  . Radiation-induced dermatitis   . Squamous cell carcinoma of retromolar trigone [facility]) oncologist-  dr Isidore Moos   Stage IVB, pT1b, pN0, Grade 2, +PNI, no LVSI---  S/P  RADICAL NECK DISSECTION  AND RADIATION  . Thoracic ascending aortic aneurysm ([place]) 03/28/2016   Chest CTA 9/17: 4.6 cm fusiform ascending thoracic aortic aneurysm // Chest CTA 4/18: Ascending thoracic aortic aneurysm 4.2 cm >> repeat 1 year  . 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 Transitional cell carcinoma of bladder [facility]) urologist-- dr Junious Silk   High - grade  . Venous insufficiency      Social History   Socioeconomic History  . Marital status: Single    Spouse name: Not on file  . Number of children: 2  . Years of education: Not on file  . Highest education level: Not on file  Social Needs  . Financial resource strain: Not on file  . Food  insecurity - worry: Not on file  . Food insecurity - inability: Not on file  . Transportation needs - medical: Not on file  . Transportation needs - non-medical: Not on file  Occupational History  . Not on file  Tobacco Use  . Smoking status: Never Smoker  . Smokeless tobacco: Never Used  Substance and Sexual Activity  . Alcohol use: Yes    Comment: vodka 3-4 daily   (Hx alcohol withdrawal )  . Drug use: No  . Sexual activity: Not on file  Other Topics Concern  . Not on file  Social History Narrative   Patient is divorced with 2 children.   Patient has never smoked. Patient has never used smokeless tobacco.    Patient drinks gin or vodka on a daily basis.   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 CATARACT EXTRACTION W/ INTRAOCULAR LENS  IMPLANT, BILATERAL    . COLONOSCOPY N/A 07/23/2016   Procedure: COLONOSCOPY;  Surgeon: Wilford Corner, MD;  Location[facility] ENDOSCOPY;  Service: Endoscopy;  Laterality: N/A;  . CYSTOSCOPY N/A 02/02/2015   Procedure: CYSTOSCOPY, INSTILLATION OF MITOMYCIN C;  Surgeon: Lowella Bandy, MD;  Location: [facility];  Service: Urology;  Laterality: N/A;  . CYSTOSCOPY WITH BIOPSY N/A 03/27/2015   Procedure: CYSTOSCOPY WITH BLADDER  BIOPSY WITH FULGERATION;  Surgeon: Festus Aloe, MD;  Location: [facility];  Service: Urology;  Laterality: N/A;  . ESOPHAGOGASTRODUODENOSCOPY N/A 07/23/2016   Procedure: ESOPHAGOGASTRODUODENOSCOPY (EGD);  Surgeon: Wilford Corner, MD;  Location: [facility] ENDOSCOPY;  Service: Endoscopy;  Laterality: N/A;  . INGUINAL HERNIA REPAIR Bilateral right 1988//  left 1970's  . LAMINECTOMY WITH POSTERIOR LATERAL ARTHRODESIS LEVEL 1  09-01-2003   L3 -5 LAMINECTOMY W/ DECOMPRESSION AND FUSION L4-5  . MAXILLECTOMY Right 07/21/14,   [facility]   Radical Neck Dissection, Maxillectomy,  Right Marginal Mandibulectomy, dental extractions, Parascauplar fasciocutaeous Free flap reconstruction  . RETINAL DETACHMENT REPAIR W/ SCLERAL BUCKLE LE  11-03-2000  . ROTATOR CUFF REPAIR Right 12-11-2000  . tonsil biopsy Right 05/24/14  . TRANSURETHRAL RESECTION OF BLADDER TUMOR N/A 02/02/2015   Procedure: TRANSURETHRAL RESECTION OF BLADDER TUMOR (TURBT);  Surgeon: Lowella Bandy, MD;  Location: [facility];  Service: Urology;  Laterality: N/A;    Family History  Problem Relation Age of Onset  . Cancer Mother        lung ca  . Alcohol abuse Sister   . Hypertension Neg Hx        family hx  . Sudden death Neg  Hx        famiylhx  . Heart attack Neg Hx     No Known Allergies  Current Outpatient Medications on File Prior to Visit  Medication Sig Dispense Refill  . brimonidine-timolol (COMBIGAN) 0.2-0.5 % ophthalmic solution Place 1 drop into both eyes 2 (two) times daily.     Marland Kitchen DHEA 50 MG TABS Take 1 tablet by mouth daily.    . feeding supplement, ENSURE ENLIVE, (ENSURE ENLIVE) LIQD Take 237 mLs by mouth 2 (two) times daily between meals. 237 mL 12  . furosemide (LASIX) 40 MG tablet Take 1 tablet (40 mg total) by mouth daily. 180 tablet 2  . latanoprost (XALATAN) 0.005 % ophthalmic solution Place 1 drop into both eyes every evening.   4  . Lutein-Zeaxanthin 15-0.7 MG CAPS Take 1 tablet by mouth daily.    . Melatonin 10 MG TABS Take 10 mg by mouth at bedtime as needed (for sleep).    . sodium chloride (OCEAN) 0.65 % nasal spray Place 2 sprays into the nose as needed for congestion.     . sodium fluoride (DENTA 5000 PLUS) 1.1 % CREA dental cream Apply a thin ribbon of gel to tooth brush. Brush teeth for 2 minutes. Spit out excess. Repeat nightly. (Patient taking differently: Place 1 application onto teeth at bedtime. Apply a thin ribbon of gel to tooth brush. Brush teeth for 2 minutes. Spit out excess. Repeat nightly.) 51 g 0  . traMADol (ULTRAM) 50 MG tablet Take 1 tablet (50 mg  total) by mouth every 6 (six) hours as needed for moderate pain. 30 tablet 0  . triamcinolone cream (KENALOG) 0.1 % Apply topically 2 (two) times daily. 453.6 g 2   No current facility-administered medications on file prior to visit.     BP 110/60 (BP Location: Left Arm, Patient Position: Sitting, Cuff Size: Normal)   Pulse 76   Temp (!) 97.4 F (36.3 C) (Oral)   Ht 5\' 8"  (1.727 m)   Wt 143 lb 6.4 oz (65 kg)   BMI 21.80 kg/m      Review of Systems  Constitutional: Negative for appetite change, chills, fatigue and fever.  HENT: Negative for congestion, dental problem, ear pain, hearing loss, sore throat, tinnitus, trouble swallowing and voice change.   Eyes: Negative for pain, discharge and visual disturbance.  Respiratory: Negative for cough, chest tightness, wheezing and stridor.   Cardiovascular: Negative for chest pain, palpitations and leg swelling.  Gastrointestinal: Negative for abdominal distention, abdominal pain, blood in stool, constipation, diarrhea, nausea and vomiting.  Genitourinary: Negative for difficulty urinating, discharge, flank pain, genital sores, hematuria and urgency.  Musculoskeletal: Positive for gait problem. Negative for arthralgias, back pain, joint swelling, myalgias and neck stiffness.  Skin: Negative for rash.  Neurological: Positive for weakness, light-headedness and numbness. Negative for dizziness, syncope, speech difficulty and headaches.  Hematological: Negative for adenopathy. Does not bruise/bleed easily.  Psychiatric/Behavioral: Negative for behavioral problems and dysphoric mood. The patient is not nervous/anxious.        Objective:   Physical Exam  Constitutional: He is oriented to person, place, and time. He appears well-developed.  Alert and appropriate Frail Blood pressure on arrival 110/60  Repeat blood pressure 90/60  HENT:  Head: Normocephalic.  Right Ear: External ear normal.  Left Ear: External ear normal.  Eyes:  Conjunctivae and EOM are normal.  Neck: Normal range of motion.  Cardiovascular: Normal rate and normal heart sounds.  Pulmonary/Chest: Breath sounds normal.  Abdominal:  Bowel sounds are normal.  Musculoskeletal: Normal range of motion. He exhibits edema. He exhibits no tenderness.  Neurological: He is alert and oriented to person, place, and time.  Psychiatric: He has a normal mood and affect. His behavior is normal.          Assessment & Plan:   Lower extremity edema.  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  Follow-up ENT   We will continue to moderate salt intake Continue present diuretic dose CPX as scheduled ENT follow-up and oncology as scheduled  Or exercise encouraged with a walker.  He will consider formal physical therapy  [name]

## 2017-05-07 NOTE — Patient Instructions (Signed)
Limit your sodium (Salt) intake    It is important that you exercise regularly; use your walker and walk as much as possible

## 2017-06-17 ENCOUNTER — Telehealth: Payer: Self-pay | Admitting: Internal Medicine

## 2017-06-17 NOTE — Telephone Encounter (Signed)
Pt dropped off a letter stating he needs a prescription for a shower chair. Called [phone] and spoke with [name] informed him that his prescription is at the front desk ready for pick up. Pt verbalized understanding.

## 2017-07-14 DIAGNOSIS — R1310 Dysphagia, unspecified: Secondary | ICD-10-CM | POA: Diagnosis not present

## 2017-07-14 DIAGNOSIS — K224 Dyskinesia of esophagus: Secondary | ICD-10-CM | POA: Diagnosis not present

## 2017-07-28 ENCOUNTER — Other Ambulatory Visit: Payer: Self-pay | Admitting: Dermatology

## 2017-07-28 DIAGNOSIS — D485 Neoplasm of uncertain behavior of skin: Secondary | ICD-10-CM | POA: Diagnosis not present

## 2017-07-28 DIAGNOSIS — C44319 Basal cell carcinoma of skin of other parts of face: Secondary | ICD-10-CM | POA: Diagnosis not present

## 2017-07-28 DIAGNOSIS — D0439 Carcinoma in situ of skin of other parts of face: Secondary | ICD-10-CM | POA: Diagnosis not present

## 2017-07-28 DIAGNOSIS — L570 Actinic keratosis: Secondary | ICD-10-CM | POA: Diagnosis not present

## 2017-07-28 DIAGNOSIS — C4492 Squamous cell carcinoma of skin, unspecified: Secondary | ICD-10-CM

## 2017-07-28 DIAGNOSIS — C44622 Squamous cell carcinoma of skin of right upper limb, including shoulder: Secondary | ICD-10-CM | POA: Diagnosis not present

## 2017-07-28 DIAGNOSIS — C4491 Basal cell carcinoma of skin, unspecified: Secondary | ICD-10-CM

## 2017-07-28 HISTORY — DX: Squamous cell carcinoma of skin, unspecified: C44.92

## 2017-07-28 HISTORY — DX: Basal cell carcinoma of skin, unspecified: C44.91

## 2017-08-07 ENCOUNTER — Telehealth: Payer: Self-pay

## 2017-08-07 NOTE — Telephone Encounter (Signed)
Pt's son, [name] (not on [place]), called in to advise that 2 weeks ago he mailed a "packet" of information to our office, addressed to Dr. Raliegh Ip. He wanted to make sure we had received package and given to Dr. Ferol Luz and I searched for this packet of information at the nurses' station and in Dr. Marthann Schiller office - we were not able to locate the documents. Pt's son states that the package included a letter, some information and a new HCPOA to be put into pt's chart. I advised him that we could not locate the package/documents. He is very upset that we "have lost it". I advised son that it is possible it was never delivered or may have been sent to the wrong address. He states that this "is hard to believe because mail doesn't get lost". I advised him I would send a message to Dr. Raliegh Ip to ask if he has seen the package, as he is not in the office this afternoon. Pt's son is upset because he "wanted these things done before pt's appt next week". I advised him that he would need to bring additional copies of the documents and we could make sure they are scanned in to pt's chart. He states that the documents contained a letter to Dr. Raliegh Ip about his father's health condition and the family's concerns about his ongoing medical care and the possible need for home health or other assistance. Advised pt's son that since he is not on the DPR, or any other HIPAA form at this time, that I cannot speak with him about the pt's healthcare or needs. I did advise him that we routinely refer to [place] for evaluation and assessment of needs and that this can be done at the OV next week. He began to calm down and was appreciative of the information, he states that he is just very worried about his father's health. He would like to know what options they may have to care for pt for his increasing needs.  Dr. Raliegh Ip - [facility]ier - pt's son will be coming to appt next week. Thanks!

## 2017-08-10 NOTE — Telephone Encounter (Signed)
ATC pt's son to advise, vm is full so unable to leave message.

## 2017-08-10 NOTE — Telephone Encounter (Signed)
Spoke with pt's son and advised of message from Dr. Raliegh Ip. Nothing further needed at this time.

## 2017-08-10 NOTE — Telephone Encounter (Signed)
Please notify son that I have received the packet of information and will review again prior to his father's office visit. Notify son that home health assistance cannot be initiated prior to a face-to-face evaluation with will be done at the time of his next office visit

## 2017-08-12 ENCOUNTER — Encounter: Payer: Self-pay | Admitting: Internal Medicine

## 2017-08-12 ENCOUNTER — Ambulatory Visit (INDEPENDENT_AMBULATORY_CARE_PROVIDER_SITE_OTHER): Payer: Medicare Other | Admitting: Internal Medicine

## 2017-08-12 VITALS — BP 102/70 | HR 70 | Ht 68.0 in | Wt 131.0 lb

## 2017-08-12 DIAGNOSIS — R531 Weakness: Secondary | ICD-10-CM

## 2017-08-12 DIAGNOSIS — C9110 Chronic lymphocytic leukemia of B-cell type not having achieved remission: Secondary | ICD-10-CM

## 2017-08-12 DIAGNOSIS — R634 Abnormal weight loss: Secondary | ICD-10-CM | POA: Diagnosis not present

## 2017-08-12 DIAGNOSIS — M5440 Lumbago with sciatica, unspecified side: Secondary | ICD-10-CM | POA: Diagnosis not present

## 2017-08-12 DIAGNOSIS — M159 Polyosteoarthritis, unspecified: Secondary | ICD-10-CM

## 2017-08-12 DIAGNOSIS — K219 Gastro-esophageal reflux disease without esophagitis: Secondary | ICD-10-CM

## 2017-08-12 DIAGNOSIS — D630 Anemia in neoplastic disease: Secondary | ICD-10-CM | POA: Diagnosis not present

## 2017-08-12 DIAGNOSIS — R600 Localized edema: Secondary | ICD-10-CM

## 2017-08-12 DIAGNOSIS — E119 Type 2 diabetes mellitus without complications: Secondary | ICD-10-CM

## 2017-08-12 DIAGNOSIS — I10 Essential (primary) hypertension: Secondary | ICD-10-CM | POA: Diagnosis not present

## 2017-08-12 DIAGNOSIS — C148 Malignant neoplasm of overlapping sites of lip, oral cavity and pharynx: Secondary | ICD-10-CM | POA: Diagnosis not present

## 2017-08-12 DIAGNOSIS — C062 Malignant neoplasm of retromolar area: Secondary | ICD-10-CM

## 2017-08-12 DIAGNOSIS — C9190 Lymphoid leukemia, unspecified not having achieved remission: Secondary | ICD-10-CM | POA: Diagnosis not present

## 2017-08-12 DIAGNOSIS — M150 Primary generalized (osteo)arthritis: Secondary | ICD-10-CM

## 2017-08-12 LAB — COMPREHENSIVE METABOLIC PANEL
ALT: 7 U/L (ref 0–53)
AST: 20 U/L (ref 0–37)
Albumin: 3.6 g/dL (ref 3.5–5.2)
Alkaline Phosphatase: 81 U/L (ref 39–117)
BUN: 19 mg/dL (ref 6–23)
CALCIUM: 9.6 mg/dL (ref 8.4–10.5)
CHLORIDE: 87 meq/L — AB (ref 96–112)
CO2: 36 mEq/L — ABNORMAL HIGH (ref 19–32)
CREATININE: 1.14 mg/dL (ref 0.40–1.50)
GFR: 64.84 mL/min (ref >60.00)
Glucose, Bld: 113 mg/dL — ABNORMAL HIGH (ref 70–99)
Potassium: 4.7 mEq/L (ref 3.5–5.1)
Sodium: 128 mEq/L — ABNORMAL LOW (ref 135–145)
Total Bilirubin: 1.3 mg/dL — ABNORMAL HIGH (ref 0.2–1.2)
Total Protein: 6.6 g/dL (ref 6.0–8.3)

## 2017-08-12 LAB — CBC WITH DIFFERENTIAL/PLATELET
BASOS ABS: 0 10*3/uL (ref 0.0–0.1)
BASOS PCT: 0.5 % (ref 0.0–3.0)
EOS ABS: 0 10*3/uL (ref 0.0–0.7)
Eosinophils Relative: 0.4 % (ref 0.0–5.0)
HEMATOCRIT: 32.8 % — AB (ref 39.0–52.0)
Hemoglobin: 11.6 g/dL — ABNORMAL LOW (ref 13.0–17.0)
Lymphocytes Relative: 37.9 % (ref 12.0–46.0)
Lymphs Abs: 2.9 10*3/uL (ref 0.7–4.0)
MCHC: 35.4 g/dL (ref 30.0–36.0)
MCV: 108.4 fl — ABNORMAL HIGH (ref 78.0–100.0)
Monocytes Absolute: 0.6 10*3/uL (ref 0.1–1.0)
Monocytes Relative: 8.1 % (ref 3.0–12.0)
Neutro Abs: 4.1 10*3/uL (ref 1.4–7.7)
Neutrophils Relative %: 53.1 % (ref 43.0–77.0)
Platelets: 232 10*3/uL (ref 150.0–400.0)
RBC: 3.03 Mil/uL — ABNORMAL LOW (ref 4.22–5.81)
RDW: 17.3 % — AB (ref 11.5–15.5)
WBC: 7.7 10*3/uL (ref 4.0–10.5)

## 2017-08-12 LAB — TSH: TSH: 3.92 u[IU]/mL (ref 0.35–4.50)

## 2017-08-12 NOTE — Patient Instructions (Signed)
Limit your sodium (Salt) intake  Consume as many calories as possible to maintain or increase your weight

## 2017-08-12 NOTE — Progress Notes (Signed)
Subjective:    Patient ID: Timothy Kim, male    DOB: [birth year], 84 y.o.   MRN: [id number]  HPI  84 year old patient who is seen today accompanied by his son and significant other. 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  He has been using Ensure supplements but has a poor appetite He has a prior history of diabetes but with weight loss this is now diet controlled. He has had progressive weakness and weight loss.  He has had a recent ENT evaluation about 6 weeks ago. He also has a history of significant low back pain and spinal stenosis and has had a difficult time with ambulation Chronic lower extremity edema has been an issue but at times becomes quite severe with open ulceration and weeping.  Skin is quite thin and fragile. In the past he has been ambulatory with a walker but over the past several weeks has required a four-point walker. His physical condition continues to decline with weight loss and poor nutrition and inability for self-care.  Past Medical History:  Diagnosis Date  . Alcohol abuse   . Allergic rhinitis   . Anemia in neoplastic disease    chronic  . Bilateral lower extremity edema    feet  . Borderline diabetes   . BPH (benign prostatic hypertrophy)   . CLL (chronic lymphocytic leukemia) [facility]) oncologist-  dr Heath Lark ([facility])   dx Jan 2014 --- Stage 1--  currently asymptomatic as of Apr 2016 and No active disease  . DDD (degenerative disc disease), cervical   . DDD (degenerative disc disease), lumbosacral   . 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 Dysphasia functional--  speech therapy   post op  radical neck dissection 07-21-2014--   changed diet to no chewy food , softer foods , states with the changes swallowed okay without any issues  . Glaucoma    both eyes  . History of gastroesophageal reflux (GERD)   . History of kidney stones    right side--  per CT from [facility] 01-17-2015 non-obstructing  . History of radiation therapy    09-04-2014 to 10-13-2014  Right retromolar region (tumor bed) and right neck/  60Gy in 30 fractions to tumor bed and 54Gy in 30 fractions to neck  . History of radiation to head and neck region 09-04-2014 to  10-13-2014   right retromolar region and right neck  60 Gy  . History of syncope    a. Event monitor 11/17: No evidence of significant AVB or > 3 second pause.; plan ILR if recurrent syncope in the future  . History of tracheostomy    post op radical neck dissection  07-21-2014--  post op tracheostomy on 07-25-2014  decannulated and sutured 07-29-2014  . Hypertension   . Macular degeneration    right eye only  . MRSA (methicillin resistant staph aureus) culture positive   . OA (osteoarthritis)   . Organic impotence   . Pneumonia 12/2015  . Pulmonary nodule    benign  and stable per last CT  . Radiation-induced dermatitis   . 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) oncologist-  dr Isidore Moos   Stage IVB, pT1b, pN0, Grade 2, +PNI, no  LVSI---  S/P  RADICAL NECK DISSECTION AND RADIATION  . Thoracic ascending aortic aneurysm ([facility]) 03/28/2016   Chest CTA 9/17: 4.6 cm fusiform ascending thoracic aortic aneurysm // Chest CTA 4/18: Ascending thoracic aortic aneurysm 4.2 cm >> repeat 1 year  . 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 Transitional cell carcinoma of bladder [facility]) urologist-- dr Junious Silk   High - grade  . Venous insufficiency      Social History   Socioeconomic History  . Marital status: Single    Spouse name: Not on file  . Number of children: 2  . Years of education: Not on file  . Highest education level:  Not on file  Social Needs  . Financial resource strain: Not on file  . Food insecurity - worry: Not on file  . Food insecurity - inability: Not on file  . Transportation needs - medical: Not on file  . Transportation needs - non-medical: Not on file  Occupational History  . Not on file  Tobacco Use  . Smoking status: Never Smoker  . Smokeless tobacco: Never Used  Substance and Sexual Activity  . Alcohol use: Yes    Comment: vodka 3-4 daily   (Hx alcohol withdrawal )  . Drug use: No  . Sexual activity: Not on file  Other Topics Concern  . Not on file  Social History Narrative   Patient is divorced with 2 children.   Patient has never smoked. Patient has never used smokeless tobacco.    Patient drinks gin or vodka on a daily basis.   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 CATARACT EXTRACTION W/ INTRAOCULAR LENS  IMPLANT, BILATERAL    . COLONOSCOPY N/A 07/23/2016   Procedure: COLONOSCOPY;  Surgeon: Wilford Corner, MD;  Location: [facility] ENDOSCOPY;  Service: Endoscopy;  Laterality: N/A;  . CYSTOSCOPY N/A 02/02/2015   Procedure: CYSTOSCOPY, INSTILLATION OF MITOMYCIN C;  Surgeon: Lowella Bandy, MD;  Location: [facility];  Service: Urology;  Laterality: N/A;  . CYSTOSCOPY WITH BIOPSY N/A 03/27/2015   Procedure: CYSTOSCOPY WITH BLADDER  BIOPSY WITH FULGERATION;  Surgeon: Festus Aloe, MD;  Location: [facility];  Service: Urology;  Laterality: N/A;  . ESOPHAGOGASTRODUODENOSCOPY N/A 07/23/2016   Procedure: ESOPHAGOGASTRODUODENOSCOPY (EGD);  Surgeon: Wilford Corner, MD;  Location: [facility] ENDOSCOPY;  Service: Endoscopy;  Laterality: N/A;  . INGUINAL HERNIA REPAIR Bilateral right 1988//  left 1970's  . LAMINECTOMY WITH POSTERIOR LATERAL ARTHRODESIS LEVEL 1  09-01-2003   L3 -5 LAMINECTOMY W/ DECOMPRESSION AND FUSION L4-5    . MAXILLECTOMY Right 07/21/14,   [facility]   Radical Neck Dissection, Maxillectomy, Right Marginal Mandibulectomy, dental extractions, Parascauplar fasciocutaeous Free flap reconstruction  . RETINAL DETACHMENT REPAIR W/ SCLERAL BUCKLE LE  11-03-2000  . ROTATOR CUFF REPAIR Right 12-11-2000  . tonsil biopsy Right 05/24/14  . TRANSURETHRAL RESECTION OF BLADDER TUMOR N/A 02/02/2015   Procedure: TRANSURETHRAL RESECTION OF BLADDER TUMOR (TURBT);  Surgeon: Lowella Bandy, MD;  Location: [facility];  Service: Urology;  Laterality: N/A;    Family History  Problem Relation Age of Onset  . Cancer Mother        lung ca  . Alcohol abuse Sister   . Hypertension Neg Hx  family hx  . Sudden death Neg Hx        famiylhx  . Heart attack Neg Hx     No Known Allergies  Current Outpatient Medications on File Prior to Visit  Medication Sig Dispense Refill  . brimonidine-timolol (COMBIGAN) 0.2-0.5 % ophthalmic solution Place 1 drop into both eyes 2 (two) times daily.     Marland Kitchen DHEA 50 MG TABS Take 1 tablet by mouth daily.    . feeding supplement, ENSURE ENLIVE, (ENSURE ENLIVE) LIQD Take 237 mLs by mouth 2 (two) times daily between meals. 237 mL 12  . furosemide (LASIX) 40 MG tablet Take 1 tablet (40 mg total) by mouth daily. 180 tablet 2  . latanoprost (XALATAN) 0.005 % ophthalmic solution Place 1 drop into both eyes every evening.   4  . Lutein-Zeaxanthin 15-0.7 MG CAPS Take 1 tablet by mouth daily.    . Melatonin 10 MG TABS Take 10 mg by mouth at bedtime as needed (for sleep).    . sodium chloride (OCEAN) 0.65 % nasal spray Place 2 sprays into the nose as needed for congestion.     . sodium fluoride (DENTA 5000 PLUS) 1.1 % CREA dental cream Apply a thin ribbon of gel to tooth brush. Brush teeth for 2 minutes. Spit out excess. Repeat nightly. (Patient taking differently: Place 1 application onto teeth at bedtime. Apply a thin ribbon of gel to tooth brush. Brush teeth for 2 minutes. Spit out  excess. Repeat nightly.) 51 g 0  . triamcinolone cream (KENALOG) 0.1 % Apply topically 2 (two) times daily. 453.6 g 2  . zolpidem (AMBIEN) 5 MG tablet Take 1 tablet (5 mg total) at bedtime by mouth. 30 tablet 0   No current facility-administered medications on file prior to visit.     BP 102/70 (BP Location: Right Arm, Patient Position: Sitting, Cuff Size: Normal)   Pulse 70   Ht 5\' 8"  (1.727 m)   Wt 131 lb (59.4 kg)   SpO2 96%   BMI 19.92 kg/m   Subsequent Medicare wellness visit  1. Risk factors, based on past  M,S,F history of cardiovascular risk factors include a prior history of type 2 diabetes now controlled with diet.  Likewise patient has a history of essential hypertension but antihypertensive medications have been discontinued.  2.  Physical activities: Patient has a history of profound weight loss and weakness is amatory only short distances with a walker  3.  Depression/mood: Some situational depression  4.  Hearing:  Moderately severe hearing deficits 5.  ADL's: Now requires assistance in all aspects of daily living  6.  Fall risk: Extremely high.  Patient is prone to fall easily and has had multiple falls over the past 6 months  7.  Home safety: No problems identified  8.  Height weight, and visual acuity; due to anorexia and swallowing difficulties has had significant weight loss  9.  Counseling: Nutritional options discussed.  Dietary referral and possible PEG tube also discussed  10. Lab orders based on risk factors: We will check updated lab  11. Referral : Follow-up ENT.  We will schedule consult with home health for evaluation and assistance  12. Care plan: We will make aggressive attempts to improve patient's nutritional status  13. Cognitive assessment: Alert and appropriate with normal affect  14. Screening: Patient provided with a written and personalized 5-10 year screening schedule in the AVS.    15. Provider List Update: ENT primary care  cardiology and oncology  Review of Systems  Constitutional: Positive for activity change, appetite change, fatigue and unexpected weight change. Negative for chills and fever.  HENT: Positive for dental problem, drooling, ear pain, facial swelling, hearing loss, rhinorrhea, sinus pressure and trouble swallowing. Negative for congestion, sore throat, tinnitus and voice change.   Eyes: Negative for pain, discharge and visual disturbance.  Respiratory: Negative for cough, chest tightness, wheezing and stridor.   Cardiovascular: Positive for leg swelling. Negative for chest pain and palpitations.  Gastrointestinal: Negative for abdominal distention, abdominal pain, blood in stool, constipation, diarrhea, nausea and vomiting.  Endocrine: Positive for cold intolerance.  Genitourinary: Negative for difficulty urinating, discharge, flank pain, genital sores, hematuria and urgency.  Musculoskeletal: Positive for back pain and gait problem. Negative for arthralgias, joint swelling, myalgias and neck stiffness.  Skin: Positive for rash and wound.  Neurological: Positive for speech difficulty, weakness, light-headedness and numbness. Negative for dizziness, syncope and headaches.  Hematological: Negative for adenopathy. Bruises/bleeds easily.  Psychiatric/Behavioral: Negative for behavioral problems and dysphoric mood. The patient is not nervous/anxious.        Objective:   Physical Exam  Constitutional: He is oriented to person, place, and time. He appears well-developed.  Elderly, chronically ill Ambulatory with a walker Alert No distress Weight 131 Blood pressure low normal  HENT:  Head: Normocephalic.  Right Ear: External ear normal.  Left Ear: External ear normal.  Decreased mobility of the tongue Distortion of the oropharynx due to prior surgery  Minimal wax in both canals  Eyes: Conjunctivae and EOM are normal.  Neck: Normal range of motion.  Cardiovascular: Normal rate and  normal heart sounds.  Pulmonary/Chest: Effort normal and breath sounds normal. No respiratory distress. He has no wheezes. He has no rales.  Abdominal: Bowel sounds are normal.  Musculoskeletal: Normal range of motion. He exhibits edema. He exhibits no tenderness.  Prominent lower extremity edema  Lymphadenopathy:    He has no cervical adenopathy.  Neurological: He is alert and oriented to person, place, and time.  Decreased vibratory sensation distally  Skin:  Prominent ecchymoses over the lower arms  Psychiatric: He has a normal mood and affect. His behavior is normal.          Assessment & Plan:   Malnutrition secondary to swallowing difficulties and anorexia.  Nutritional options discussed.  Patient has considerable lower extremity edema and is a high thrombotic risk.  Do not feel he is an appropriate candidate for [facility].  Will consider nutritional referral.  The option of a PEG tube in the future also discussed  Essential hypertension.  Presently controlled off medications Diabetes.  Presently controlled off medications  Subsequent Medicare wellness visit  Spinal stenosis with chronic low back pain Gait abnormality  Chronic venous insufficiency with lower extremity edema Dependence and aspects of daily living Generalized weakness High fall risk  [name]

## 2017-08-13 ENCOUNTER — Telehealth: Payer: Self-pay | Admitting: Internal Medicine

## 2017-08-13 NOTE — Telephone Encounter (Signed)
Copied from [place]. Topic: General - Other >> Aug 13, 2017 11:09 AM Scherrie Gerlach wrote: Reason for CRM: Ailene Ravel with [facility] calling to advise pt wants to put off his start of care until Monday, 2/25. They received the order yesterday

## 2017-08-14 NOTE — Telephone Encounter (Signed)
Noted  

## 2017-08-17 ENCOUNTER — Telehealth: Payer: Self-pay | Admitting: Family Medicine

## 2017-08-17 DIAGNOSIS — R261 Paralytic gait: Secondary | ICD-10-CM | POA: Diagnosis not present

## 2017-08-17 DIAGNOSIS — M6281 Muscle weakness (generalized): Secondary | ICD-10-CM | POA: Diagnosis not present

## 2017-08-17 DIAGNOSIS — I10 Essential (primary) hypertension: Secondary | ICD-10-CM | POA: Diagnosis not present

## 2017-08-17 DIAGNOSIS — R609 Edema, unspecified: Secondary | ICD-10-CM | POA: Diagnosis not present

## 2017-08-17 DIAGNOSIS — R1310 Dysphagia, unspecified: Secondary | ICD-10-CM | POA: Diagnosis not present

## 2017-08-17 DIAGNOSIS — R634 Abnormal weight loss: Secondary | ICD-10-CM | POA: Diagnosis not present

## 2017-08-17 NOTE — Telephone Encounter (Signed)
Yes please expedite referrals for nursing OT and PT

## 2017-08-17 NOTE — Telephone Encounter (Signed)
Copied from [place] [phone]. Topic: Inquiry >> Aug 17, 2017 10:19 AM Oliver Pila B wrote: Reason for CRM: Nurse called [facility] home [facility] called to get verbal orders from Dr. Raliegh Ip for the pt of nursing, OT and PT Nurse states pt's bp today is 92/60  A symptomatic  Also pt may need an appetite stimulant Contact nurse @ [phone]

## 2017-08-18 NOTE — Telephone Encounter (Signed)
Called and left message for nurse at [facility] to return phone call.

## 2017-08-18 NOTE — Telephone Encounter (Signed)
The only appetite stimulant that may be helpful would place patient at risk of worsening lower extremity edema and blood clots

## 2017-08-18 NOTE — Telephone Encounter (Signed)
Spoke with Caryl Pina from [place] giving her the [place] for OT and PT per Dr.Kwiatkowski. I will return phone call regarding an appetite  Stimulant.

## 2017-08-19 ENCOUNTER — Telehealth: Payer: Self-pay | Admitting: Internal Medicine

## 2017-08-19 DIAGNOSIS — M6281 Muscle weakness (generalized): Secondary | ICD-10-CM | POA: Diagnosis not present

## 2017-08-19 DIAGNOSIS — R261 Paralytic gait: Secondary | ICD-10-CM | POA: Diagnosis not present

## 2017-08-19 DIAGNOSIS — R634 Abnormal weight loss: Secondary | ICD-10-CM | POA: Diagnosis not present

## 2017-08-19 DIAGNOSIS — R1310 Dysphagia, unspecified: Secondary | ICD-10-CM | POA: Diagnosis not present

## 2017-08-19 DIAGNOSIS — I10 Essential (primary) hypertension: Secondary | ICD-10-CM | POA: Diagnosis not present

## 2017-08-19 DIAGNOSIS — R609 Edema, unspecified: Secondary | ICD-10-CM | POA: Diagnosis not present

## 2017-08-19 NOTE — Telephone Encounter (Signed)
Spoke with Caryl Pina at [facility] and informed her of the risk of the appetite stimulant and she said she will send the message to the family and look into other ways to increase appetite.

## 2017-08-19 NOTE — Telephone Encounter (Signed)
Copied from [place]. Topic: Quick Communication - See Telephone Encounter >> Aug 19, 2017 10:10 AM Lolita Rieger, RMA wrote: CRM for notification. See Telephone encounter for:   08/19/17.Pete from [facility] physical therapist called and stated that he will continue services due to his evaluation today and needs verbal [phone]

## 2017-08-20 NOTE — Telephone Encounter (Signed)
Okay for PT

## 2017-08-21 NOTE — Telephone Encounter (Signed)
Gave Timothy Kim verbal orders for PT per Dr.Kwiatkowski.

## 2017-08-24 DIAGNOSIS — R1310 Dysphagia, unspecified: Secondary | ICD-10-CM | POA: Diagnosis not present

## 2017-08-24 DIAGNOSIS — R261 Paralytic gait: Secondary | ICD-10-CM | POA: Diagnosis not present

## 2017-08-24 DIAGNOSIS — I10 Essential (primary) hypertension: Secondary | ICD-10-CM | POA: Diagnosis not present

## 2017-08-24 DIAGNOSIS — M6281 Muscle weakness (generalized): Secondary | ICD-10-CM | POA: Diagnosis not present

## 2017-08-24 DIAGNOSIS — R634 Abnormal weight loss: Secondary | ICD-10-CM | POA: Diagnosis not present

## 2017-08-24 DIAGNOSIS — R609 Edema, unspecified: Secondary | ICD-10-CM | POA: Diagnosis not present

## 2017-08-27 DIAGNOSIS — R634 Abnormal weight loss: Secondary | ICD-10-CM | POA: Diagnosis not present

## 2017-08-27 DIAGNOSIS — I10 Essential (primary) hypertension: Secondary | ICD-10-CM | POA: Diagnosis not present

## 2017-08-27 DIAGNOSIS — R609 Edema, unspecified: Secondary | ICD-10-CM | POA: Diagnosis not present

## 2017-08-27 DIAGNOSIS — R261 Paralytic gait: Secondary | ICD-10-CM | POA: Diagnosis not present

## 2017-08-27 DIAGNOSIS — M6281 Muscle weakness (generalized): Secondary | ICD-10-CM | POA: Diagnosis not present

## 2017-08-27 DIAGNOSIS — R1310 Dysphagia, unspecified: Secondary | ICD-10-CM | POA: Diagnosis not present

## 2017-08-28 DIAGNOSIS — I10 Essential (primary) hypertension: Secondary | ICD-10-CM | POA: Diagnosis not present

## 2017-08-28 DIAGNOSIS — R609 Edema, unspecified: Secondary | ICD-10-CM | POA: Diagnosis not present

## 2017-08-28 DIAGNOSIS — R634 Abnormal weight loss: Secondary | ICD-10-CM | POA: Diagnosis not present

## 2017-08-28 DIAGNOSIS — M6281 Muscle weakness (generalized): Secondary | ICD-10-CM | POA: Diagnosis not present

## 2017-08-28 DIAGNOSIS — R1310 Dysphagia, unspecified: Secondary | ICD-10-CM | POA: Diagnosis not present

## 2017-08-28 DIAGNOSIS — R261 Paralytic gait: Secondary | ICD-10-CM | POA: Diagnosis not present

## 2017-08-30 ENCOUNTER — Encounter: Payer: Self-pay | Admitting: Internal Medicine

## 2017-09-01 ENCOUNTER — Telehealth: Payer: Self-pay | Admitting: Internal Medicine

## 2017-09-01 DIAGNOSIS — R1310 Dysphagia, unspecified: Secondary | ICD-10-CM | POA: Diagnosis not present

## 2017-09-01 DIAGNOSIS — I10 Essential (primary) hypertension: Secondary | ICD-10-CM | POA: Diagnosis not present

## 2017-09-01 DIAGNOSIS — R261 Paralytic gait: Secondary | ICD-10-CM | POA: Diagnosis not present

## 2017-09-01 DIAGNOSIS — M6281 Muscle weakness (generalized): Secondary | ICD-10-CM | POA: Diagnosis not present

## 2017-09-01 DIAGNOSIS — C148 Malignant neoplasm of overlapping sites of lip, oral cavity and pharynx: Secondary | ICD-10-CM

## 2017-09-01 DIAGNOSIS — R634 Abnormal weight loss: Secondary | ICD-10-CM | POA: Diagnosis not present

## 2017-09-01 DIAGNOSIS — R609 Edema, unspecified: Secondary | ICD-10-CM | POA: Diagnosis not present

## 2017-09-01 NOTE — Telephone Encounter (Signed)
Copied from [place] [phone]. Topic: Quick Communication - See Telephone Encounter >> Sep 01, 2017  4:31 PM Vernona Rieger wrote: CRM for notification. See Telephone encounter for:   09/01/17.  Caryl Pina RN from [facility] called and said she has a concern that he doesn't eat much and has some weight loss. They want to know could she have verbal orders for a dietician consult or nutrition. Please call Caryl Pina back [phone]

## 2017-09-01 NOTE — Telephone Encounter (Signed)
Noted routing to [facility]

## 2017-09-01 NOTE — Telephone Encounter (Signed)
Okay for dietary/nutrition consult

## 2017-09-02 NOTE — Telephone Encounter (Signed)
Returned call to [place]. No answer. Voicemail full, unable to leave message.

## 2017-09-03 NOTE — Telephone Encounter (Signed)
Referral placed.

## 2017-09-03 NOTE — Telephone Encounter (Signed)
Spoke with Caryl Pina letting her know an order for dietician/ nutrition consult will be placed if possible.

## 2017-09-04 DIAGNOSIS — M6281 Muscle weakness (generalized): Secondary | ICD-10-CM | POA: Diagnosis not present

## 2017-09-04 DIAGNOSIS — R634 Abnormal weight loss: Secondary | ICD-10-CM | POA: Diagnosis not present

## 2017-09-04 DIAGNOSIS — R1310 Dysphagia, unspecified: Secondary | ICD-10-CM | POA: Diagnosis not present

## 2017-09-04 DIAGNOSIS — R261 Paralytic gait: Secondary | ICD-10-CM | POA: Diagnosis not present

## 2017-09-04 DIAGNOSIS — I10 Essential (primary) hypertension: Secondary | ICD-10-CM | POA: Diagnosis not present

## 2017-09-04 DIAGNOSIS — R609 Edema, unspecified: Secondary | ICD-10-CM | POA: Diagnosis not present

## 2017-09-09 DIAGNOSIS — R634 Abnormal weight loss: Secondary | ICD-10-CM | POA: Diagnosis not present

## 2017-09-09 DIAGNOSIS — R609 Edema, unspecified: Secondary | ICD-10-CM | POA: Diagnosis not present

## 2017-09-09 DIAGNOSIS — M6281 Muscle weakness (generalized): Secondary | ICD-10-CM | POA: Diagnosis not present

## 2017-09-09 DIAGNOSIS — R261 Paralytic gait: Secondary | ICD-10-CM | POA: Diagnosis not present

## 2017-09-09 DIAGNOSIS — R1310 Dysphagia, unspecified: Secondary | ICD-10-CM | POA: Diagnosis not present

## 2017-09-09 DIAGNOSIS — I10 Essential (primary) hypertension: Secondary | ICD-10-CM | POA: Diagnosis not present

## 2017-09-10 ENCOUNTER — Other Ambulatory Visit: Payer: Self-pay | Admitting: Dermatology

## 2017-09-10 DIAGNOSIS — C44319 Basal cell carcinoma of skin of other parts of face: Secondary | ICD-10-CM | POA: Diagnosis not present

## 2017-09-10 DIAGNOSIS — L988 Other specified disorders of the skin and subcutaneous tissue: Secondary | ICD-10-CM | POA: Diagnosis not present

## 2017-09-11 ENCOUNTER — Ambulatory Visit: Payer: Self-pay | Admitting: *Deleted

## 2017-09-11 ENCOUNTER — Encounter: Payer: Self-pay | Admitting: Family Medicine

## 2017-09-11 ENCOUNTER — Ambulatory Visit (INDEPENDENT_AMBULATORY_CARE_PROVIDER_SITE_OTHER): Payer: Medicare Other | Admitting: Family Medicine

## 2017-09-11 VITALS — BP 100/64 | HR 68 | Temp 97.5°F | Wt 131.0 lb

## 2017-09-11 DIAGNOSIS — I959 Hypotension, unspecified: Secondary | ICD-10-CM | POA: Diagnosis not present

## 2017-09-11 DIAGNOSIS — R634 Abnormal weight loss: Secondary | ICD-10-CM | POA: Diagnosis not present

## 2017-09-11 DIAGNOSIS — L03116 Cellulitis of left lower limb: Secondary | ICD-10-CM | POA: Diagnosis not present

## 2017-09-11 DIAGNOSIS — I10 Essential (primary) hypertension: Secondary | ICD-10-CM | POA: Diagnosis not present

## 2017-09-11 DIAGNOSIS — R609 Edema, unspecified: Secondary | ICD-10-CM | POA: Diagnosis not present

## 2017-09-11 DIAGNOSIS — R1310 Dysphagia, unspecified: Secondary | ICD-10-CM | POA: Diagnosis not present

## 2017-09-11 DIAGNOSIS — M6281 Muscle weakness (generalized): Secondary | ICD-10-CM | POA: Diagnosis not present

## 2017-09-11 DIAGNOSIS — R261 Paralytic gait: Secondary | ICD-10-CM | POA: Diagnosis not present

## 2017-09-11 MED ORDER — DOXYCYCLINE HYCLATE 100 MG PO TABS: 100.0000 mg | ORAL_TABLET | Freq: Two times a day (BID) | ORAL | 0 refills | Status: AC

## 2017-09-11 NOTE — Patient Instructions (Signed)
Cellulitis, Adult Cellulitis is a skin infection. The infected area is usually red and tender. This condition occurs most often in the arms and lower legs. The infection can travel to the muscles, blood, and underlying tissue and become serious. It is very important to get treated for this condition. What are the causes? Cellulitis is caused by bacteria. The bacteria enter through a break in the skin, such as a cut, burn, insect bite, open sore, or crack. What increases the risk? This condition is more likely to occur in people who:  Have a weak defense system (immune system).  Have open wounds on the skin such as cuts, burns, bites, and scrapes. Bacteria can enter the body through these open wounds.  Are older.  Have diabetes.  Have a type of long-lasting (chronic) liver disease (cirrhosis) or kidney disease.  Use IV drugs.  What are the signs or symptoms? Symptoms of this condition include:  Redness, streaking, or spotting on the skin.  Swollen area of the skin.  Tenderness or pain when an area of the skin is touched.  Warm skin.  Fever.  Chills.  Blisters.  How is this diagnosed? This condition is diagnosed based on a medical history and physical exam. You may also have tests, including:  Blood tests.  Lab tests.  Imaging tests.  How is this treated? Treatment for this condition may include:  Medicines, such as antibiotic medicines or antihistamines.  Supportive care, such as rest and application of cold or warm cloths (cold or warm compresses) to the skin.  Hospital care, if the condition is severe.  The infection usually gets better within 1-2 days of treatment. Follow these instructions at home:  Take over-the-counter and prescription medicines only as told by your health care provider.  If you were prescribed an antibiotic medicine, take it as told by your health care provider. Do not stop taking the antibiotic even if you start to feel  better.  Drink enough fluid to keep your urine clear or pale yellow.  Do not touch or rub the infected area.  Raise (elevate) the infected area above the level of your heart while you are sitting or lying down.  Apply warm or cold compresses to the affected area as told by your health care provider.  Keep all follow-up visits as told by your health care provider. This is important. These visits let your health care provider make sure a more serious infection is not developing. Contact a health care provider if:  You have a fever.  Your symptoms do not improve within 1-2 days of starting treatment.  Your bone or joint underneath the infected area becomes painful after the skin has healed.  Your infection returns in the same area or another area.  You notice a swollen bump in the infected area.  You develop new symptoms.  You have a general ill feeling (malaise) with muscle aches and pains. Get help right away if:  Your symptoms get worse.  You feel very sleepy.  You develop vomiting or diarrhea that persists.  You notice red streaks coming from the infected area.  Your red area gets larger or turns dark in color. This information is not intended to replace advice given to you by your health care provider. Make sure you discuss any questions you have with your health care provider. Document Released: 03/19/2005 Document Revised: 10/18/2015 Document Reviewed: 04/18/2015 Elsevier Interactive Patient Education  2018 [employer].  Hypotension Hypotension, commonly called low blood pressure, is when  the force of blood pumping through your arteries is too weak. Arteries are blood vessels that carry blood from the heart throughout the body. When blood pressure is too low, you may not get enough blood to your brain or to the rest of your organs. This can cause weakness, light-headedness, rapid heartbeat, and fainting. Depending on the cause and severity, hypotension may be harmless  (benign) or cause serious problems (critical). What are the causes? Possible causes of hypotension include:  Blood loss.  Loss of body fluids (dehydration).  Heart problems.  Hormone (endocrine) problems.  Pregnancy.  Severe infection.  Lack of certain nutrients.  Severe allergic reactions (anaphylaxis).  Certain medicines, such as blood pressure medicine or medicines that make the body lose excess fluids (diuretics). Sometimes, hypotension can be caused by not taking medicine as directed, such as taking too much of a certain medicine.  What increases the risk? Certain factors can make you more likely to develop hypotension, including:  Age. Risk increases as you get older.  Conditions that affect the heart or the central nervous system.  Taking certain medicines, such as blood pressure medicine or diuretics.  Being pregnant.  What are the signs or symptoms? Symptoms of this condition may include:  Weakness.  Light-headedness.  Dizziness.  Blurred vision.  Fatigue.  Rapid heartbeat.  Fainting, in severe cases.  How is this diagnosed? This condition is diagnosed based on:  Your medical history.  Your symptoms.  Your blood pressure measurement. Your health care provider will check your blood pressure when you are: ? Lying down. ? Sitting. ? Standing.  A blood pressure reading is recorded as two numbers, such as "120 over 80" (or 120/80). The first ("top") number is called the systolic pressure. It is a measure of the pressure in your arteries as your heart beats. The second ("bottom") number is called the diastolic pressure. It is a measure of the pressure in your arteries when your heart relaxes between beats. Blood pressure is measured in a unit called mm Hg. Healthy blood pressure for adults is 120/80. If your blood pressure is below 90/60, you may be diagnosed with hypotension. Other information or tests that may be used to diagnose hypotension  include:  Your other vital signs, such as your heart rate and temperature.  Blood tests.  Tilt table test. For this test, you will be safely secured to a table that moves you from a lying position to an upright position. Your heart rhythm and blood pressure will be monitored during the test.  How is this treated? Treatment for this condition may include:  Changing your diet. This may involve eating more salt (sodium) or drinking more water.  Taking medicines to raise your blood pressure.  Changing the dosage of certain medicines you are taking that might be lowering your blood pressure.  Wearing compression stockings. These stockings help to prevent blood clots and reduce swelling in your legs.  In some cases, you may need to go to the hospital for:  Fluid replacement. This means you will receive fluids through an IV tube.  Blood replacement. This means you will receive donated blood through an IV tube (transfusion).  Treating an infection or heart problems, if this applies.  Monitoring. You may need to be monitored while medicines that you are taking wear off.  Follow these instructions at home: Eating and drinking   Drink enough fluid to keep your urine clear or pale yellow.  Eat a healthy diet and follow instructions from  your health care provider about eating or drinking restrictions. A healthy diet includes: ? Fresh fruits and vegetables. ? Whole grains. ? Lean meats. ? Low-fat dairy products.  Eat extra salt only as directed. Do not add extra salt to your diet unless your health care provider told you to do that.  Eat frequent, small meals.  Avoid standing up suddenly after eating. Medicines  Take over-the-counter and prescription medicines only as told by your health care provider. ? Follow instructions from your health care provider about changing the dosage of your current medicines, if this applies. ? Do not stop or adjust any of your medicines on your  own. General instructions  Wear compression stockings as told by your health care provider.  Get up slowly from lying down or sitting positions. This gives your blood pressure a chance to adjust.  Avoid hot showers and excessive heat as directed by your health care provider.  Return to your normal activities as told by your health care provider. Ask your health care provider what activities are safe for you.  Do not use any products that contain nicotine or tobacco, such as cigarettes and e-cigarettes. If you need help quitting, ask your health care provider.  Keep all follow-up visits as told by your health care provider. This is important. Contact a health care provider if:  You vomit.  You have diarrhea.  You have a fever for more than 2-3 days.  You feel more thirsty than usual.  You feel weak and tired. Get help right away if:  You have chest pain.  You have a fast or irregular heartbeat.  You develop numbness in any part of your body.  You cannot move your arms or your legs.  You have trouble speaking.  You become sweaty or feel light-headed.  You faint.  You feel short of breath.  You have trouble staying awake.  You feel confused. This information is not intended to replace advice given to you by your health care provider. Make sure you discuss any questions you have with your health care provider. Document Released: 06/09/2005 Document Revised: 12/28/2015 Document Reviewed: 11/29/2015 Elsevier Interactive Patient Education  2018 [employer].

## 2017-09-11 NOTE — Progress Notes (Signed)
Subjective:    Patient ID: Timothy Kim, male    DOB: 10/11/1929, 84 y.o.   MRN: [id number]  No chief complaint on file.   HPI Patient was seen today for acute concern.  Per patient's wife patient's BP was lower than normal when checked by home health 82/60 and 82/50.  Patient was asymptomatic.  Patient states he does not drink any water during the day.  He drinks mostly juice.  Patient is not on blood pressure medication anymore, but does take Lasix 40 mg daily.  Past Medical History:  Diagnosis Date  . Alcohol abuse   . Allergic rhinitis   . Anemia in neoplastic disease    chronic  . Bilateral lower extremity edema    feet  . Borderline diabetes   . BPH (benign prostatic hypertrophy)   . CLL (chronic lymphocytic leukemia) [facility]) oncologist-  dr Heath Lark ([facility])   dx Jan 2014 --- Stage 1--  currently asymptomatic as of Apr 2016 and No active disease  . DDD (degenerative disc disease), cervical   . DDD (degenerative disc disease), lumbosacral   . 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 Dysphasia functional--  speech therapy   post op  radical neck dissection 07-21-2014--  changed diet to no chewy food , softer foods , states with the changes swallowed okay without any issues  . Glaucoma    both eyes  . History of gastroesophageal reflux (GERD)   . History of kidney stones    right side--  per CT from [facility] 01-17-2015 non-obstructing  . History of radiation therapy    09-04-2014 to 10-13-2014  Right retromolar region (tumor bed) and right neck/  60Gy in 30 fractions to tumor bed and 54Gy in 30 fractions to neck  . History of radiation to head and neck region 09-04-2014 to  10-13-2014   right retromolar region and right neck  60 Gy  . History of syncope    a. Event monitor  11/17: No evidence of significant AVB or > 3 second pause.; plan ILR if recurrent syncope in the future  . History of tracheostomy    post op radical neck dissection  07-21-2014--  post op tracheostomy on 07-25-2014  decannulated and sutured 07-29-2014  . Hypertension   . Macular degeneration    right eye only  . MRSA (methicillin resistant staph aureus) culture positive   . OA (osteoarthritis)   . Organic impotence   . Pneumonia 12/2015  . Pulmonary nodule    benign  and stable per last CT  . Radiation-induced dermatitis   . Squamous cell carcinoma of retromolar trigone [facility]) oncologist-  dr Isidore Moos   Stage IVB, pT1b, pN0, Grade 2, +PNI, no LVSI---  S/P  RADICAL NECK DISSECTION AND RADIATION  . Thoracic ascending aortic aneurysm ([place]) 03/28/2016   Chest CTA 9/17: 4.6 cm fusiform ascending thoracic aortic aneurysm // Chest CTA 4/18: Ascending thoracic aortic aneurysm 4.2 cm >> repeat 1 year  . 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 Transitional cell carcinoma of bladder [facility]) urologist-- dr Junious Silk   High - grade  . Venous insufficiency     No Known Allergies  ROS General: Denies fever, chills, night sweats, changes in weight, changes in appetite   +  hypotension HEENT: Denies headaches, ear pain, changes in vision, rhinorrhea, sore throat CV: Denies CP, palpitations, SOB, orthopnea Pulm: Denies SOB, cough, wheezing GI: Denies abdominal pain, nausea, vomiting, diarrhea, constipation GU: Denies dysuria, hematuria, frequency, vaginal discharge Msk: Denies muscle cramps, joint pains  + lymphedema Neuro: Denies weakness, numbness, tingling Skin: Denies rashes, bruising Psych: Denies depression, anxiety, hallucinations     Objective:    Blood pressure 100/64, pulse 68, temperature (!) 97.5 F (36.4 C), temperature source Oral, weight 131 lb (59.4 kg), SpO2 97 %.   Gen. Pleasant, well-nourished, in no distress, normal affect, sitting  in wheelchair HEENT: [place]/AT, no scleral icterus, nares patent without drainage Lungs: no accessory muscle use, CTAB, no wheezes or rales Cardiovascular: RRR, no m/r/g, peripheral edema Neuro:  A&Ox3, CN II-XII intact, normal gait Skin:  Warm.  Left lower leg with several cuts in various stages of healing.  A healing lesion on the left upper ankle with erythema, increased warmth, no induration, 1+ pitting edema bilaterally.   Wt Readings from Last 3 Encounters:  09/11/17 131 lb (59.4 kg)  08/12/17 131 lb (59.4 kg)  05/07/17 143 lb 6.4 oz (65 kg)    Lab Results  Component Value Date   WBC 7.7 08/12/2017   HGB 11.6 (L) 08/12/2017   HCT 32.8 (L) 08/12/2017   PLT 232.0 08/12/2017   GLUCOSE 113 (H) 08/12/2017   CHOL 203 (H) 01/07/2012   TRIG 65.0 01/07/2012   HDL 79.20 01/07/2012   LDLDIRECT 104.4 01/07/2012   LDLCALC 101 (H) 12/12/2009   ALT 7 08/12/2017   AST 20 08/12/2017   NA 128 (L) 08/12/2017   K 4.7 08/12/2017   CL 87 (L) 08/12/2017   CREATININE 1.14 08/12/2017   BUN 19 08/12/2017   CO2 36 (H) 08/12/2017   TSH 3.92 08/12/2017   PSA 0.77 12/12/2009   HGBA1C 5.7 03/26/2017    Assessment/Plan:  Left leg cellulitis -Given handout - Plan: doxycycline (VIBRA-TABS) 100 MG tablet -Patient given ED precautions including fever, chills, nausea, vomiting, increased erythema, etc.  Hypotension, unspecified hypotension type -At baseline here in office -Likely second to dehydration and possibly from infection.  Also possibly secondary to user error -Patient encouraged to increase p.o. intake of water -Given handout -If continues consider decreasing Lasix.  Follow-up next week with PCP.  Grier Mitts, MD

## 2017-09-11 NOTE — Telephone Encounter (Signed)
Pt  Normally   Runs  A  Low  bp   But  Has  Never  Been this  Low -  Pt  Is  verbally  responsive    [place]. No  Availability   With  Dr  Raliegh Ip   Appointment  Made  With  Grier Mitts   At  445  Pm   Spoke  With Rachel Bo at the  Practice       Reason for Disposition . [8] Systolic BP < 90 AND [3] NOT dizzy, lightheaded or weak  Answer Assessment - Initial Assessment Questions 1. BLOOD PRESSURE: "What is the blood pressure?" "Did you take at least two measurements 5 minutes apart?"      82/60   At  1101  5  mins  Later   82/50     2. ONSET: "When did you take your blood pressure?"       1101   3. HOW: "How did you obtain the blood pressure?" (e.g., visiting nurse, automatic home BP monitor)     Visiting  Nurse   Manuel  bp  Cuff   4. HISTORY: "Do you have a history of low blood pressure?" "What is your blood pressure normally?"      Typically   Range   Systolic  15-176      Diastolic   16-07     5. MEDICATIONS: "Are you taking any medications for blood pressure?" If yes: "Have they been changed recently?"       No       6. PULSE RATE: "Do you know what your pulse rate is?"        74    7. OTHER SYMPTOMS: "Have you been sick recently?" "Have you had a recent injury?"        Nothing   Gets    Most of  His  Nutrition via  Oral  Forms Pt  Has  An abrasion  l  Leg  For  About  6   Weeks   Some  Redness  To  The  Affected  Area  The  Area   Is  About  The  Size  Of a  Quarter  With a  Slight  Amount tissue  Sloughing   8. PREGNANCY: "Is there any chance you are pregnant?" "When was your last menstrual period?"     n/a  Protocols used: LOW BLOOD PRESSURE-A-AH

## 2017-09-14 DIAGNOSIS — R609 Edema, unspecified: Secondary | ICD-10-CM | POA: Diagnosis not present

## 2017-09-14 DIAGNOSIS — M6281 Muscle weakness (generalized): Secondary | ICD-10-CM | POA: Diagnosis not present

## 2017-09-14 DIAGNOSIS — R261 Paralytic gait: Secondary | ICD-10-CM | POA: Diagnosis not present

## 2017-09-14 DIAGNOSIS — R634 Abnormal weight loss: Secondary | ICD-10-CM | POA: Diagnosis not present

## 2017-09-14 DIAGNOSIS — R1310 Dysphagia, unspecified: Secondary | ICD-10-CM | POA: Diagnosis not present

## 2017-09-14 DIAGNOSIS — I10 Essential (primary) hypertension: Secondary | ICD-10-CM | POA: Diagnosis not present

## 2017-09-15 DIAGNOSIS — I10 Essential (primary) hypertension: Secondary | ICD-10-CM | POA: Diagnosis not present

## 2017-09-15 DIAGNOSIS — R261 Paralytic gait: Secondary | ICD-10-CM | POA: Diagnosis not present

## 2017-09-15 DIAGNOSIS — R634 Abnormal weight loss: Secondary | ICD-10-CM | POA: Diagnosis not present

## 2017-09-15 DIAGNOSIS — M6281 Muscle weakness (generalized): Secondary | ICD-10-CM | POA: Diagnosis not present

## 2017-09-15 DIAGNOSIS — R609 Edema, unspecified: Secondary | ICD-10-CM | POA: Diagnosis not present

## 2017-09-15 DIAGNOSIS — R1310 Dysphagia, unspecified: Secondary | ICD-10-CM | POA: Diagnosis not present

## 2017-09-21 DIAGNOSIS — I10 Essential (primary) hypertension: Secondary | ICD-10-CM | POA: Diagnosis not present

## 2017-09-21 DIAGNOSIS — R261 Paralytic gait: Secondary | ICD-10-CM | POA: Diagnosis not present

## 2017-09-21 DIAGNOSIS — R1310 Dysphagia, unspecified: Secondary | ICD-10-CM | POA: Diagnosis not present

## 2017-09-21 DIAGNOSIS — M6281 Muscle weakness (generalized): Secondary | ICD-10-CM | POA: Diagnosis not present

## 2017-09-21 DIAGNOSIS — Z4802 Encounter for removal of sutures: Secondary | ICD-10-CM | POA: Diagnosis not present

## 2017-09-21 DIAGNOSIS — R609 Edema, unspecified: Secondary | ICD-10-CM | POA: Diagnosis not present

## 2017-09-21 DIAGNOSIS — R634 Abnormal weight loss: Secondary | ICD-10-CM | POA: Diagnosis not present

## 2017-09-28 DIAGNOSIS — H04123 Dry eye syndrome of bilateral lacrimal glands: Secondary | ICD-10-CM | POA: Diagnosis not present

## 2017-09-28 DIAGNOSIS — H353132 Nonexudative age-related macular degeneration, bilateral, intermediate dry stage: Secondary | ICD-10-CM | POA: Diagnosis not present

## 2017-09-28 DIAGNOSIS — H401113 Primary open-angle glaucoma, right eye, severe stage: Secondary | ICD-10-CM | POA: Diagnosis not present

## 2017-09-28 DIAGNOSIS — H401121 Primary open-angle glaucoma, left eye, mild stage: Secondary | ICD-10-CM | POA: Diagnosis not present

## 2017-09-28 DIAGNOSIS — Z961 Presence of intraocular lens: Secondary | ICD-10-CM | POA: Diagnosis not present

## 2017-09-29 ENCOUNTER — Telehealth: Payer: Self-pay | Admitting: Internal Medicine

## 2017-09-29 DIAGNOSIS — R634 Abnormal weight loss: Secondary | ICD-10-CM | POA: Diagnosis not present

## 2017-09-29 DIAGNOSIS — I10 Essential (primary) hypertension: Secondary | ICD-10-CM | POA: Diagnosis not present

## 2017-09-29 DIAGNOSIS — R261 Paralytic gait: Secondary | ICD-10-CM | POA: Diagnosis not present

## 2017-09-29 DIAGNOSIS — R1310 Dysphagia, unspecified: Secondary | ICD-10-CM | POA: Diagnosis not present

## 2017-09-29 DIAGNOSIS — M6281 Muscle weakness (generalized): Secondary | ICD-10-CM | POA: Diagnosis not present

## 2017-09-29 DIAGNOSIS — R609 Edema, unspecified: Secondary | ICD-10-CM | POA: Diagnosis not present

## 2017-09-29 NOTE — Telephone Encounter (Signed)
Noted routed to [facility].

## 2017-09-29 NOTE — Telephone Encounter (Signed)
Copied from [place] [id number]. Topic: Quick Communication - See Telephone Encounter >> Sep 29, 2017 12:56 PM Ether Griffins B wrote: CRM for notification. See Telephone encounter for: 09/29/17. Ashely with [facility] calling to report BP check today on pt. His BP is 90/60 HR 78 asymptomatic CB# [phone].

## 2017-09-30 NOTE — Progress Notes (Signed)
Timothy Kim presents for follow up of radiation completed 10/13/14 to his right retromolar region and right neck.   Pain issues, if any: He reports lower back pain when bending over at times.  Using a feeding tube?: No Weight changes, if any:  Wt Readings from Last 3 Encounters:  10/02/17 130 lb 3.2 oz (59.1 kg)  09/11/17 131 lb (59.4 kg)  08/12/17 131 lb (59.4 kg)   Swallowing issues, if any: He has difficulty chewing food because his teeth don't close properly. He is eating softer foods and drinking one ensure daily.  Smoking or chewing tobacco? No Using fluoride trays daily? No Last ENT visit was on: Dr. Erik Obey 00/93/81 Other notable issues, if any: He reports that several teeth have been moving or rotating, but denies pain. He has not seen a dentist recently.    BP 119/68   Pulse 64   Temp 98.1 F (36.7 C)   Resp 16   Ht 5\' 8"  (1.727 m)   Wt 130 lb 3.2 oz (59.1 kg)   SpO2 99% Comment: room air  BMI 19.80 kg/m

## 2017-10-02 ENCOUNTER — Other Ambulatory Visit: Payer: Self-pay

## 2017-10-02 ENCOUNTER — Ambulatory Visit: Admission: RE | Admit: 2017-10-02 | Payer: Medicare Other | Source: Ambulatory Visit

## 2017-10-02 ENCOUNTER — Ambulatory Visit
Admission: RE | Admit: 2017-10-02 | Discharge: 2017-10-02 | Disposition: A | Discharge status: Home / Self Care | Payer: Medicare Other | Source: Ambulatory Visit | Attending: Radiation Oncology | Admitting: Radiation Oncology

## 2017-10-02 ENCOUNTER — Encounter: Payer: Self-pay | Admitting: Radiation Oncology

## 2017-10-02 VITALS — BP 119/68 | HR 64 | Temp 98.1°F | Resp 16 | Ht 68.0 in | Wt 130.2 lb

## 2017-10-02 DIAGNOSIS — Z79899 Other long term (current) drug therapy: Secondary | ICD-10-CM | POA: Diagnosis not present

## 2017-10-02 DIAGNOSIS — K055 Other periodontal diseases: Secondary | ICD-10-CM | POA: Diagnosis not present

## 2017-10-02 DIAGNOSIS — R011 Cardiac murmur, unspecified: Secondary | ICD-10-CM | POA: Diagnosis not present

## 2017-10-02 DIAGNOSIS — M545 Low back pain: Secondary | ICD-10-CM | POA: Diagnosis not present

## 2017-10-02 DIAGNOSIS — Z08 Encounter for follow-up examination after completed treatment for malignant neoplasm: Secondary | ICD-10-CM | POA: Diagnosis not present

## 2017-10-02 DIAGNOSIS — C062 Malignant neoplasm of retromolar area: Secondary | ICD-10-CM | POA: Diagnosis not present

## 2017-10-02 DIAGNOSIS — Z923 Personal history of irradiation: Secondary | ICD-10-CM | POA: Insufficient documentation

## 2017-10-02 DIAGNOSIS — Z85818 Personal history of malignant neoplasm of other sites of lip, oral cavity, and pharynx: Secondary | ICD-10-CM | POA: Diagnosis not present

## 2017-10-02 DIAGNOSIS — R590 Localized enlarged lymph nodes: Secondary | ICD-10-CM | POA: Diagnosis not present

## 2017-10-02 DIAGNOSIS — K1329 Other disturbances of oral epithelium, including tongue: Secondary | ICD-10-CM | POA: Diagnosis not present

## 2017-10-02 MED ORDER — FLUCONAZOLE 10 MG/ML PO SUSR: ORAL | 0 refills | Status: DC

## 2017-10-02 NOTE — Progress Notes (Signed)
Radiation Oncology         [phone] ________________________________  Name: Timothy Kim MRN: [id number]  Date: 10/02/2017  DOB: September 17, 1929  Follow-Up Visit Note  CC: Marletta Lor, MD  Philomena Doheny, MD  Diagnosis and Prior Radiotherapy:       ICD-10-CM   1. 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  Most recent imaging was CT soft tissue neck on 12/25/2016 showed stable post treatment neck and no evidence of recurrence. He last saw Dr. Erik Obey on 12/45/8099.  On review of systems, the patient reports lower back pain when bending over at times. He does not have a feeding tube. He has difficulty chewing food because his teeth don't close properly. He is eating softer foods and drinking one Ensure daily. He has lost about 10 lbs since our visit last year. He reports that several teeth have been moving or rotating, but denies pain. He has not seen a dentist recently.  He states he remembers the advice given by his nutritionist, declines re-referral.   ALLERGIES:  has No Known Allergies.  Meds: Current Outpatient Medications  Medication Sig Dispense Refill  . brimonidine-timolol (COMBIGAN) 0.2-0.5 % ophthalmic solution Place 1 drop into both eyes 2 (two) times daily.     Marland Kitchen DHEA 50 MG TABS Take 1 tablet by mouth daily.    . feeding supplement, ENSURE ENLIVE, (ENSURE ENLIVE) LIQD Take 237 mLs by mouth 2 (two) times daily between meals. 237 mL  12  . furosemide (LASIX) 40 MG tablet Take 1 tablet (40 mg total) by mouth daily. 180 tablet 2  . latanoprost (XALATAN) 0.005 % ophthalmic solution Place 1 drop into both eyes every evening.   4  . Lutein-Zeaxanthin 15-0.7 MG CAPS Take 1 tablet by mouth daily.    . Melatonin 10 MG TABS Take 10 mg by mouth at bedtime as needed (for sleep).    . sodium chloride (OCEAN) 0.65 % nasal spray Place 2 sprays into the nose as needed for congestion.     . triamcinolone cream (KENALOG) 0.1 % Apply topically 2 (two) times daily. 453.6 g 2  . fluconazole (DIFLUCAN) 10 MG/ML suspension Take 20 mL by mouth today, then 10 mL daily for 6 more days. 80 mL 0  . sodium fluoride (DENTA 5000 PLUS) 1.1 % CREA dental cream Apply a thin ribbon of gel to tooth brush. Brush teeth for 2 minutes. Spit out excess. Repeat nightly. (Patient not taking: Reported on 10/02/2017) 51 g 0  . zolpidem (AMBIEN) 5 MG tablet Take 1 tablet (5 mg total) at bedtime by mouth. (Patient not taking: Reported on 10/02/2017) 30 tablet 0   No current facility-administered medications for this encounter.     Physical Findings: The patient is in no acute distress. Patient is alert and oriented.    Wt Readings from Last 3 Encounters:  10/02/17 130 lb 3.2 oz (59.1 kg)  09/11/17 131 lb (59.4  kg)  08/12/17 131 lb (59.4 kg)    height is 5\' 8"  (1.727 m) and weight is 130 lb 3.2 oz (59.1 kg). His temperature is 98.1 F (36.7 C). His blood pressure is 119/68 and his pulse is 64. His respiration is 16 and oxygen saturation is 99%.   General: Alert and oriented, in no acute distress. HEENT: Patient has some white patches over his buccal mucosa which could be thrush. No sign of recurrence. Post op changes.  Facial edema is relatively stable. He also has a lot of plaque on his teeth. Neck: No palpable cervical or supraclavicular lymphadenopathy. In the Level II region he has a firm mass that is about 1 to 1.5 cm in dimension, posterior to his submandibular  regional which is fairly firm as well. Otherwise no palpable masses in his neck.  Lymph: see HEENT and Neck Exam. Heart: Systolic murmur heard loudest at the tricuspid and mitral valve. He also has some skipped beats. Chest: Clear to auscultation bilaterally. Psychiatric: Judgment and insight are intact. Affect is appropriate. MSK: Uses a walker to ambulate.  Lab Findings: Lab Results  Component Value Date   WBC 7.7 08/12/2017   HGB 11.6 (L) 08/12/2017   HCT 32.8 (L) 08/12/2017   MCV 108.4 (H) 08/12/2017   PLT 232.0 08/12/2017   CMP     Component Value Date/Time   NA 128 (L) 08/12/2017 1029   NA 135 09/19/2016 1013   NA 135 (L) 10/16/2015 1039   K 4.7 08/12/2017 1029   K 3.0 (LL) 10/16/2015 1039   CL 87 (L) 08/12/2017 1029   CO2 36 (H) 08/12/2017 1029   CO2 36 (H) 10/16/2015 1039   GLUCOSE 113 (H) 08/12/2017 1029   GLUCOSE 129 10/16/2015 1039   BUN 19 08/12/2017 1029   BUN 15 09/19/2016 1013   BUN 11.8 10/16/2015 1039   CREATININE 1.14 08/12/2017 1029   CREATININE 0.79 04/03/2016 1021   CREATININE 1.1 10/16/2015 1039   CALCIUM 9.6 08/12/2017 1029   CALCIUM 9.9 10/16/2015 1039   PROT 6.6 08/12/2017 1029   PROT 7.5 10/16/2015 1039   ALBUMIN 3.6 08/12/2017 1029   ALBUMIN 3.4 (L) 10/16/2015 1039   AST 20 08/12/2017 1029   AST 49 (H) 10/16/2015 1039   ALT 7 08/12/2017 1029   ALT 20 10/16/2015 1039   ALKPHOS 81 08/12/2017 1029   ALKPHOS 104 10/16/2015 1039   BILITOT 1.3 (H) 08/12/2017 1029   BILITOT 0.81 10/16/2015 1039   GFRNONAA 84 09/19/2016 1013   GFRAA 97 09/19/2016 1013     Lab Results  Component Value Date   TSH 3.92 08/12/2017    Radiographic Findings: No results found.  Impression/Plan:     1) Head and Neck Cancer Status: level two firmness, right neck, warrants CT imaging - will order and call w. results  2) Nutritional Status: He has lost about 10 lbs since our visit last year. He does not have a feeding tube. He has difficulty chewing food  because his teeth don't close properly. He is eating softer foods and drinking one Ensure daily.   Offered the patient a referral to our nutritionist to discuss recipes which he would have an easier time chewing. He is not interested in a referral at this time. I have advised the patient to increase Ensure intake to 4 daily from current 1 daily. Discussed other foods to try (ie cream based soups)  Wt Readings from Last 3 Encounters:  10/02/17 130 lb 3.2 oz (59.1  kg)  09/11/17 131 lb (59.4 kg)  08/12/17 131 lb (59.4 kg)   3) Risk Factors: The patient has been educated about risk factors including alcohol and tobacco abuse; they understand that avoidance of alcohol and tobacco is important to prevent recurrences as well as other cancers. History of excessive ETOH use.  4) Swallowing: stable  5) Dental: Plaque noted on patient's teeth. The patient has been encouraged to followup with dentistry, and dental hygiene maintenance every 3 months. He was also encouraged to ask about prescription strength fluoride toothpaste. Wife aware - present today  6) Thyroid function: WNL; The patient understands the importance of monitoring his thyroid levels and function in regards to the management of his disease. Check yearly.  Lab Results  Component Value Date   TSH 3.92 08/12/2017   7)  Psych: He seems to be potentially depressed. I will notify his PCP that this might be worth exploring further at next check up.  8)  In the Level II region he has a firm mass that is about 1 to 1.5 cm in dimension, posterior to his submandibular gland which is fairly firm as well. We will order a CT to be performed in the next week for further evaluation of this area. We will call with the results of this imaging study.   9) Patient has some white patches over his buccal mucosa which could be thrush. Patient was prescribed Fluconazole today for what is suspected to be yeast in his mouth.  10) Systolic murmur heard loudest  at the tricuspid and mitral valve. Encouraged the patient to speak with his PCP about this finding. I will also send a note to Dr. Burnice Logan regarding this.   11) The patient will follow up with me in 1 year. I encouraged him to contact the clinic sooner with any questions or concerns that may arise in the interim.  25 minutes spent face to face with patient, over 50% on counseling and care coordination.  _____________________________   Eppie Gibson, MD   This document serves as a record of services personally performed by Eppie Gibson, MD. It was created on her behalf by Arlyce Harman, a trained medical scribe. The creation of this record is based on the scribe's personal observations and the provider's statements to them. This document has been checked and approved by the attending provider.

## 2017-10-03 ENCOUNTER — Other Ambulatory Visit: Payer: Self-pay | Admitting: Radiation Oncology

## 2017-10-03 ENCOUNTER — Encounter: Payer: Self-pay | Admitting: Radiation Oncology

## 2017-10-03 DIAGNOSIS — C062 Malignant neoplasm of retromolar area: Secondary | ICD-10-CM

## 2017-10-03 DIAGNOSIS — R221 Localized swelling, mass and lump, neck: Secondary | ICD-10-CM

## 2017-10-05 ENCOUNTER — Telehealth: Payer: Self-pay | Admitting: *Deleted

## 2017-10-05 DIAGNOSIS — R634 Abnormal weight loss: Secondary | ICD-10-CM | POA: Diagnosis not present

## 2017-10-05 DIAGNOSIS — R609 Edema, unspecified: Secondary | ICD-10-CM | POA: Diagnosis not present

## 2017-10-05 DIAGNOSIS — I10 Essential (primary) hypertension: Secondary | ICD-10-CM | POA: Diagnosis not present

## 2017-10-05 DIAGNOSIS — R261 Paralytic gait: Secondary | ICD-10-CM | POA: Diagnosis not present

## 2017-10-05 DIAGNOSIS — M6281 Muscle weakness (generalized): Secondary | ICD-10-CM | POA: Diagnosis not present

## 2017-10-05 DIAGNOSIS — R1310 Dysphagia, unspecified: Secondary | ICD-10-CM | POA: Diagnosis not present

## 2017-10-05 NOTE — Telephone Encounter (Signed)
CALLED PATIENT TO INFORM OF STAT LABS ON 10-09-17- ARRIVAL TIME - 1:30 PM  @ [place] HIS CT ON 10-09-17 - ARRIVAL TIME - 2:45 PM @ [facility] PT. TO HAVE WATER ONLY - 4 HRS. PRIOR TO TEST, SPOKE WITH PATIENT AND HE IS AWARE OF THESE APPTS.

## 2017-10-09 ENCOUNTER — Encounter (HOSPITAL_COMMUNITY): Payer: Self-pay

## 2017-10-09 ENCOUNTER — Ambulatory Visit (HOSPITAL_COMMUNITY)
Admission: RE | Admit: 2017-10-09 | Discharge: 2017-10-09 | Disposition: A | Discharge status: Home / Self Care | Payer: Medicare Other | Source: Ambulatory Visit | Attending: Radiation Oncology | Admitting: Radiation Oncology

## 2017-10-09 ENCOUNTER — Ambulatory Visit
Admission: RE | Admit: 2017-10-09 | Discharge: 2017-10-09 | Disposition: A | Discharge status: Home / Self Care | Payer: Medicare Other | Source: Ambulatory Visit | Attending: Radiation Oncology | Admitting: Radiation Oncology

## 2017-10-09 DIAGNOSIS — Z79899 Other long term (current) drug therapy: Secondary | ICD-10-CM | POA: Diagnosis not present

## 2017-10-09 DIAGNOSIS — C062 Malignant neoplasm of retromolar area: Secondary | ICD-10-CM | POA: Diagnosis not present

## 2017-10-09 DIAGNOSIS — R011 Cardiac murmur, unspecified: Secondary | ICD-10-CM | POA: Diagnosis not present

## 2017-10-09 DIAGNOSIS — R221 Localized swelling, mass and lump, neck: Secondary | ICD-10-CM

## 2017-10-09 DIAGNOSIS — M545 Low back pain: Secondary | ICD-10-CM | POA: Diagnosis not present

## 2017-10-09 DIAGNOSIS — Z923 Personal history of irradiation: Secondary | ICD-10-CM | POA: Diagnosis not present

## 2017-10-09 LAB — BUN & CREATININE (CHCC)
BUN: 14 mg/dL (ref 7–26)
CREATININE: 1.27 mg/dL (ref 0.70–1.30)
GFR, EST AFRICAN AMERICAN: 58 mL/min — AB (ref >60)
GFR, EST NON AFRICAN AMERICAN: 50 mL/min — AB (ref >60)

## 2017-10-09 MED ORDER — IOHEXOL 300 MG/ML  SOLN
75.0000 mL | Freq: Once | INTRAMUSCULAR | Status: AC | PRN
  Administered 2017-10-09: 75 mL via INTRAVENOUS

## 2017-10-12 ENCOUNTER — Telehealth: Payer: Self-pay | Admitting: Radiation Oncology

## 2017-10-12 NOTE — Telephone Encounter (Signed)
Ct Soft Tissue Neck W Contrast  Result Date: 10/09/2017 CLINICAL DATA:  New node like firmness in the upper right level II level region. Right submandibular firmness. History of squamous cell carcinoma of the right retromolar trigone status post surgery and radiation. EXAM: CT NECK WITH CONTRAST TECHNIQUE: Multidetector CT imaging of the neck was performed using the standard protocol following the bolus administration of intravenous contrast. CONTRAST:  9mL OMNIPAQUE IOHEXOL 300 MG/ML  SOLN COMPARISON:  12/25/2016 FINDINGS: Pharynx and larynx: Postsurgical changes are again noted related to tumor resection including partial resection of the right maxilla and mandible with flap placement. No nodularity is seen about the flap to suggest locally recurrent tumor. The larynx is unremarkable. Salivary glands: Similar appearance of post treatment changes including right submandibular gland resection. Thyroid: Unremarkable. Lymph nodes: No enlarged or suspicious lymph nodes in the neck, with special attention to the right level II region indicated by the BB. Vascular: Major vascular structures of the neck are patent. Aortic and carotid artery atherosclerosis. Bilateral ICA tortuosity. Limited intracranial: Unremarkable. Visualized orbits: Bilateral cataract extraction. Right scleral band. Mastoids and visualized paranasal sinuses: Postsurgical changes to the right maxillary sinus. Mild right greater than left maxillary sinus and ethmoid air cell mucosal thickening. Chronic complete left sphenoid sinus opacification. Skeleton: Chronic nonunited right mandible fracture. C1-2 arthropathy with marked ligamentous thickening and calcification posterior to the dens. Advanced cervical facet arthrosis, cervicothoracic disc degeneration, and right glenohumeral arthritis. Upper chest: Mild scarring/fibrosis in the lung apices. Other: None. IMPRESSION: Post treatment changes without evidence of recurrent tumor. Electronically Signed    By: Logan Bores M.D.   On: 10/09/2017 16:32   I called the patient and his wife and let them know about the reassuring CT results. They expressed appreciation for my call.  -----------------------------------  Eppie Gibson, MD

## 2017-10-19 ENCOUNTER — Inpatient Hospital Stay: Payer: Medicare Other | Attending: Hematology and Oncology | Admitting: Hematology and Oncology

## 2017-10-19 ENCOUNTER — Telehealth: Payer: Self-pay | Admitting: Hematology and Oncology

## 2017-10-19 DIAGNOSIS — Z923 Personal history of irradiation: Secondary | ICD-10-CM | POA: Diagnosis not present

## 2017-10-19 DIAGNOSIS — R634 Abnormal weight loss: Secondary | ICD-10-CM | POA: Diagnosis not present

## 2017-10-19 DIAGNOSIS — C148 Malignant neoplasm of overlapping sites of lip, oral cavity and pharynx: Secondary | ICD-10-CM

## 2017-10-19 DIAGNOSIS — I952 Hypotension due to drugs: Secondary | ICD-10-CM

## 2017-10-19 DIAGNOSIS — Z8551 Personal history of malignant neoplasm of bladder: Secondary | ICD-10-CM

## 2017-10-19 DIAGNOSIS — Z79899 Other long term (current) drug therapy: Secondary | ICD-10-CM | POA: Insufficient documentation

## 2017-10-19 DIAGNOSIS — Z9221 Personal history of antineoplastic chemotherapy: Secondary | ICD-10-CM | POA: Diagnosis not present

## 2017-10-19 DIAGNOSIS — Z85818 Personal history of malignant neoplasm of other sites of lip, oral cavity, and pharynx: Secondary | ICD-10-CM | POA: Diagnosis not present

## 2017-10-19 DIAGNOSIS — Z8572 Personal history of non-Hodgkin lymphomas: Secondary | ICD-10-CM | POA: Diagnosis not present

## 2017-10-19 DIAGNOSIS — D649 Anemia, unspecified: Secondary | ICD-10-CM | POA: Diagnosis not present

## 2017-10-19 DIAGNOSIS — T07XXXA Unspecified multiple injuries, initial encounter: Secondary | ICD-10-CM

## 2017-10-19 DIAGNOSIS — C9110 Chronic lymphocytic leukemia of B-cell type not having achieved remission: Secondary | ICD-10-CM

## 2017-10-19 DIAGNOSIS — I6529 Occlusion and stenosis of unspecified carotid artery: Secondary | ICD-10-CM | POA: Insufficient documentation

## 2017-10-19 NOTE — Telephone Encounter (Signed)
Gave patient avs report and appointments for April 2020. °

## 2017-10-20 ENCOUNTER — Encounter: Payer: Self-pay | Admitting: Hematology and Oncology

## 2017-10-20 ENCOUNTER — Telehealth: Payer: Self-pay | Admitting: Oncology

## 2017-10-20 DIAGNOSIS — I952 Hypotension due to drugs: Secondary | ICD-10-CM | POA: Insufficient documentation

## 2017-10-20 DIAGNOSIS — T07XXXA Unspecified multiple injuries, initial encounter: Secondary | ICD-10-CM | POA: Insufficient documentation

## 2017-10-20 NOTE — Progress Notes (Signed)
[facility] OFFICE PROGRESS NOTE  Patient Care Team: Marletta Lor, MD as PCP - Linzie Collin, MD as Consulting Physician (Otolaryngology) Heath Lark, MD as Consulting Physician (Hematology and Oncology) Karie Mainland, RD as Dietitian (Nutrition) Eppie Gibson, MD as Attending Physician (Radiation Oncology)  ASSESSMENT & PLAN:  Cancer of the lip, oral cavity, and pharynx [facility]) The patient had recent imaging study which excluded cancer recurrence Since I am not his treating physician from the head and neck standpoint, he will continue follow-up with ENT service in that regard   CLL (chronic lymphocytic leukemia) I have reviewed his recent last 2 CBC with the patient and family He has stable anemia He does not have progressive lymphocytosis from CLL standpoint I will continue to follow up on that once a year  Abnormal weight loss He had difficulties eating due to head and neck surgery causing difficulties with chewing and eating. He is only taking 1 nutritional supplement per day We discussed additional dietary supplement and frequent small meals for weight gain purposes Due to excessive recent weight loss, the patient is getting hypotensive I recommend close follow-up with primary care physician to consider medication adjustment  Drug-induced hypotension He has profound drug-induced hypotension and had near syncopal episode today with falls in the outpatient clinic He is taking furosemide for chronic leg swelling which I suspect is related to protein calorie malnutrition and poor mobility. I recommend close follow-up with primary care doctor to consider discontinuation of furosemide in the setting of poor oral intake and recent weight loss  Laceration of multiple sites of skin The patient had multiple skin laceration and superficial injuries due to near syncopal episode in the office On evaluation, the injuries are superficial and appropriate  bandages has been applied I recommend close follow-up with primary care doctor to make sure that the skin injuries heal appropriately and do not become infected   No orders of the defined types were placed in this encounter.   INTERVAL HISTORY: Please see below for problem oriented charting. He returns with family members for follow-up of CLL.  The patient also had head and neck cancer requiring surgery and radiation in 2016. The patient also have history of bladder cancer. The patient had near syncopal episode with fall in the outpatient clinic today prior to being seen by myself.   He is noted to be mildly hypotensive in the office.   According to the patient and family, he was prescribed furosemide due to bilateral leg edema.   The dose of the furosemide was recently reduced.  The patient is very frail with recent weight loss after head and neck surgery due to difficulties chewing.  He denies dysphagia.   He is only taking one nutritional supplement per day.  He had recent imaging study of the head and neck region which excluded local cancer recurrence. From the CLL standpoint, he new lymphadenopathy.   The patient denies any recent signs or symptoms of bleeding such as spontaneous epistaxis, hematuria or hematochezia.  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 Biopsy was performed on the right tonsil      05/24/2014 Pathology Results    Accession: [id number] right tonsil biopsy  confirmed squamous cell carcinoma.      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      09/04/2014 - 10/13/2014 Radiation Therapy    Rec'd Helical IMRT to right retromolar region (tumor bed) and right neck: 60 Gy in 30 fractions to tumor bed and 54 Gy in 30 fractions to neck.      01/04/2015 Imaging    CT Neck:  No residual oral cavity tumor or lymphadenopathy identified.                                 01/04/2015 Imaging    CT Chest:  No evidence of metastatic disease.   Pulmonary nodule in right base has remained stable since 2004 and is compatible with a benign process.        History of bladder cancer   02/02/2015 Surgery    He underwent TURBT      02/02/2015 Pathology Results    Accession: [id number] TURBT result showed high grade urothelial cancer      03/27/2015 Surgery    He had Cystoscopy, bladder biopsy and fulguration, 2 - 5 cm      03/27/2015 Pathology Results    Accession: [id number] pathology was negative for residual disease      09/26/2016 Imaging    CT angiogram showed ascending thoracic aortic aneurysm which is stable in size compared to prior exam based on my own measurements. Stable right lower lobe pulmonary nodule. Stable moderate size hiatal hernia       REVIEW OF SYSTEMS:   Constitutional: Denies fevers, chills  Eyes: Denies blurriness of vision Ears, nose, mouth, throat, and face: Denies mucositis or sore throat Respiratory: Denies cough, dyspnea or wheezes Cardiovascular: Denies  palpitation, chest discomfort  Gastrointestinal:  Denies nausea, heartburn or change in bowel habits Skin: Denies abnormal skin rashes Lymphatics: Denies new lymphadenopathy or easy bruising Behavioral/Psych: Mood is stable, no new changes  All other systems were reviewed with the patient and are negative.  I have reviewed the past medical history, past surgical history, social history and family history with the patient and they are unchanged from previous note.  ALLERGIES:  has No Known Allergies.  MEDICATIONS:  Current Outpatient Medications  Medication Sig Dispense Refill  . brimonidine-timolol (COMBIGAN) 0.2-0.5 % ophthalmic solution Place 1 drop into both eyes 2 (two) times daily.     Marland Kitchen DHEA 50 MG TABS Take 1 tablet by mouth daily.    . feeding supplement, ENSURE ENLIVE, (ENSURE ENLIVE) LIQD Take 237 mLs by mouth 2 (two) times daily between meals. 237 mL 12  . fluconazole (DIFLUCAN) 10 MG/ML suspension Take 20 mL by mouth today, then 10 mL daily for  6 more days. 80 mL 0  . furosemide (LASIX) 40 MG tablet Take 1 tablet (40 mg total) by mouth daily. 180 tablet 2  . latanoprost (XALATAN) 0.005 % ophthalmic solution Place 1 drop into both eyes every evening.   4  . Lutein-Zeaxanthin 15-0.7 MG CAPS Take 1 tablet by mouth daily.    . Melatonin 10 MG TABS Take 10 mg by mouth at bedtime as needed (for sleep).    . sodium chloride (OCEAN) 0.65 % nasal spray Place 2 sprays into the nose as needed for congestion.     . sodium fluoride (DENTA 5000 PLUS) 1.1 % CREA dental cream Apply a thin ribbon of gel to tooth brush. Brush teeth for 2 minutes. Spit out excess. Repeat nightly. (Patient not taking: Reported on 10/02/2017) 51 g 0  . triamcinolone cream (KENALOG) 0.1 % Apply topically 2 (two) times daily. 453.6 g 2  . zolpidem (AMBIEN) 5 MG tablet Take 1 tablet (5 mg total) at bedtime by mouth. (Patient not taking: Reported on 10/02/2017) 30 tablet 0   No current facility-administered medications  for this visit.     PHYSICAL EXAMINATION: ECOG PERFORMANCE STATUS: 2 - Symptomatic, <50% confined to bed  Vitals:   10/19/17 1249  BP: 107/63  Pulse: (!) 59  Resp: 17  SpO2: 100%   There were no vitals filed for this visit.  GENERAL:alert, no distress and comfortable.  He appears frail, being examined on the wheelchair SKIN: Not multiple skin lacerations are seen.  No active bleeding EYES: normal, Conjunctiva are pink and non-injected, sclera clear OROPHARYNX:no exudate, no erythema and lips, buccal mucosa, and tongue normal  NECK: Noted prior head and neck surgery with mild tissue thickening on the right side of the neck LYMPH:  no palpable lymphadenopathy in the cervical, axillary or inguinal LUNGS: clear to auscultation and percussion with normal breathing effort HEART: regular rate & rhythm and no murmurs with mild bilateral lower extremity edema ABDOMEN:abdomen soft, non-tender and normal bowel sounds Musculoskeletal:no cyanosis of digits and no clubbing  NEURO: alert & oriented x 3 with fluent speech, no focal motor/sensory deficits  LABORATORY DATA:  I have reviewed the data as listed    Component Value Date/Time   NA 128 (L) 08/12/2017 1029   NA 135 09/19/2016 1013   NA 135 (L) 10/16/2015 1039   K 4.7 08/12/2017 1029   K 3.0 (LL) 10/16/2015 1039   CL 87 (L) 08/12/2017 1029   CO2 36 (H) 08/12/2017 1029   CO2 36 (H) 10/16/2015 1039   GLUCOSE 113 (H) 08/12/2017 1029   GLUCOSE 129 10/16/2015 1039   BUN 14 10/09/2017 1340   BUN 15 09/19/2016 1013   BUN 11.8 10/16/2015 1039   CREATININE 1.27 10/09/2017 1340   CREATININE 0.79 04/03/2016 1021   CREATININE 1.1 10/16/2015 1039   CALCIUM 9.6 08/12/2017 1029   CALCIUM 9.9 10/16/2015 1039   PROT 6.6 08/12/2017 1029   PROT 7.5 10/16/2015 1039   ALBUMIN 3.6 08/12/2017 1029   ALBUMIN 3.4 (L) 10/16/2015 1039   AST 20 08/12/2017 1029   AST 49 (H) 10/16/2015 1039   ALT 7 08/12/2017 1029   ALT 20 10/16/2015 1039   ALKPHOS  81 08/12/2017 1029   ALKPHOS 104 10/16/2015 1039   BILITOT 1.3 (H) 08/12/2017 1029   BILITOT 0.81 10/16/2015 1039   GFRNONAA 50 (L) 10/09/2017 1340   GFRAA 58 (L) 10/09/2017 1340    No results found for: SPEP, UPEP  Lab Results  Component  Value Date   WBC 7.7 08/12/2017   NEUTROABS 4.1 08/12/2017   HGB 11.6 (L) 08/12/2017   HCT 32.8 (L) 08/12/2017   MCV 108.4 (H) 08/12/2017   PLT 232.0 08/12/2017      Chemistry      Component Value Date/Time   NA 128 (L) 08/12/2017 1029   NA 135 09/19/2016 1013   NA 135 (L) 10/16/2015 1039   K 4.7 08/12/2017 1029   K 3.0 (LL) 10/16/2015 1039   CL 87 (L) 08/12/2017 1029   CO2 36 (H) 08/12/2017 1029   CO2 36 (H) 10/16/2015 1039   BUN 14 10/09/2017 1340   BUN 15 09/19/2016 1013   BUN 11.8 10/16/2015 1039   CREATININE 1.27 10/09/2017 1340   CREATININE 0.79 04/03/2016 1021   CREATININE 1.1 10/16/2015 1039      Component Value Date/Time   CALCIUM 9.6 08/12/2017 1029   CALCIUM 9.9 10/16/2015 1039   ALKPHOS 81 08/12/2017 1029   ALKPHOS 104 10/16/2015 1039   AST 20 08/12/2017 1029   AST 49 (H) 10/16/2015 1039   ALT 7 08/12/2017 1029   ALT 20 10/16/2015 1039   BILITOT 1.3 (H) 08/12/2017 1029   BILITOT 0.81 10/16/2015 1039       RADIOGRAPHIC STUDIES: I have personally reviewed the radiological images as listed and agreed with the findings in the report. Ct Soft Tissue Neck W Contrast  Result Date: 10/09/2017 CLINICAL DATA:  New node like firmness in the upper right level II level region. Right submandibular firmness. History of squamous cell carcinoma of the right retromolar trigone status post surgery and radiation. EXAM: CT NECK WITH CONTRAST TECHNIQUE: Multidetector CT imaging of the neck was performed using the standard protocol following the bolus administration of intravenous contrast. CONTRAST:  22mL OMNIPAQUE IOHEXOL 300 MG/ML  SOLN COMPARISON:  12/25/2016 FINDINGS: Pharynx and larynx: Postsurgical changes are again noted related  to tumor resection including partial resection of the right maxilla and mandible with flap placement. No nodularity is seen about the flap to suggest locally recurrent tumor. The larynx is unremarkable. Salivary glands: Similar appearance of post treatment changes including right submandibular gland resection. Thyroid: Unremarkable. Lymph nodes: No enlarged or suspicious lymph nodes in the neck, with special attention to the right level II region indicated by the BB. Vascular: Major vascular structures of the neck are patent. Aortic and carotid artery atherosclerosis. Bilateral ICA tortuosity. Limited intracranial: Unremarkable. Visualized orbits: Bilateral cataract extraction. Right scleral band. Mastoids and visualized paranasal sinuses: Postsurgical changes to the right maxillary sinus. Mild right greater than left maxillary sinus and ethmoid air cell mucosal thickening. Chronic complete left sphenoid sinus opacification. Skeleton: Chronic nonunited right mandible fracture. C1-2 arthropathy with marked ligamentous thickening and calcification posterior to the dens. Advanced cervical facet arthrosis, cervicothoracic disc degeneration, and right glenohumeral arthritis. Upper chest: Mild scarring/fibrosis in the lung apices. Other: None. IMPRESSION: Post treatment changes without evidence of recurrent tumor. Electronically Signed   By: Logan Bores M.D.   On: 10/09/2017 16:32    All questions were answered. The patient knows to call the clinic with any problems, questions or concerns. No barriers to learning was detected.  I spent 25 minutes counseling the patient face to face. The total time spent in the appointment was 30 minutes and more than 50% was on counseling and review of test results  Heath Lark, MD 10/20/2017 6:35 AM

## 2017-10-20 NOTE — Assessment & Plan Note (Signed)
The patient had multiple skin laceration and superficial injuries due to near syncopal episode in the office On evaluation, the injuries are superficial and appropriate bandages has been applied I recommend close follow-up with primary care doctor to make sure that the skin injuries heal appropriately and do not become infected

## 2017-10-20 NOTE — Assessment & Plan Note (Signed)
The patient had recent imaging study which excluded cancer recurrence Since I am not his treating physician from the head and neck standpoint, he will continue follow-up with ENT service in that regard

## 2017-10-20 NOTE — Assessment & Plan Note (Signed)
He had difficulties eating due to head and neck surgery causing difficulties with chewing and eating. He is only taking 1 nutritional supplement per day We discussed additional dietary supplement and frequent small meals for weight gain purposes Due to excessive recent weight loss, the patient is getting hypotensive I recommend close follow-up with primary care physician to consider medication adjustment

## 2017-10-20 NOTE — Telephone Encounter (Signed)
Called Dr. Truddie Hidden office and moved up patient's appointment from 11/09/17 to Thursday, 10/22/17 at 11:30 am due to recent fall.  Called patient and advised him of the new time and purpose for the appointment.  He verbalized agreement.

## 2017-10-20 NOTE — Assessment & Plan Note (Signed)
He has profound drug-induced hypotension and had near syncopal episode today with falls in the outpatient clinic He is taking furosemide for chronic leg swelling which I suspect is related to protein calorie malnutrition and poor mobility. I recommend close follow-up with primary care doctor to consider discontinuation of furosemide in the setting of poor oral intake and recent weight loss

## 2017-10-20 NOTE — Assessment & Plan Note (Signed)
I have reviewed his recent last 2 CBC with the patient and family He has stable anemia He does not have progressive lymphocytosis from CLL standpoint I will continue to follow up on that once a year

## 2017-10-22 ENCOUNTER — Ambulatory Visit (INDEPENDENT_AMBULATORY_CARE_PROVIDER_SITE_OTHER): Payer: Medicare Other | Admitting: Internal Medicine

## 2017-10-22 ENCOUNTER — Encounter: Payer: Self-pay | Admitting: Internal Medicine

## 2017-10-22 VITALS — BP 88/68 | HR 72 | Temp 98.1°F | Wt 128.0 lb

## 2017-10-22 DIAGNOSIS — I952 Hypotension due to drugs: Secondary | ICD-10-CM | POA: Diagnosis not present

## 2017-10-22 DIAGNOSIS — C9190 Lymphoid leukemia, unspecified not having achieved remission: Secondary | ICD-10-CM

## 2017-10-22 DIAGNOSIS — C148 Malignant neoplasm of overlapping sites of lip, oral cavity and pharynx: Secondary | ICD-10-CM | POA: Diagnosis not present

## 2017-10-22 DIAGNOSIS — R55 Syncope and collapse: Secondary | ICD-10-CM

## 2017-10-22 DIAGNOSIS — C9110 Chronic lymphocytic leukemia of B-cell type not having achieved remission: Secondary | ICD-10-CM

## 2017-10-22 NOTE — Progress Notes (Signed)
Subjective:    Patient ID: Timothy Kim, male    DOB: [birth year], 84 y.o.   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  He has been on low-dose furosemide which the patient discontinued 1 week prior to his syncope His lower extremity edema has been better controlled  He continues to lose weight with anorexia  Past Medical History:  Diagnosis Date  . Alcohol abuse   . Allergic rhinitis   . Anemia in neoplastic disease    chronic  . Bilateral lower extremity edema    feet  . Borderline diabetes   . BPH (benign prostatic hypertrophy)   . CLL (chronic lymphocytic leukemia) [facility]) oncologist-  dr Heath Lark ([facility])   dx Jan 2014 --- Stage 1--  currently asymptomatic as of Apr 2016 and No active disease  . DDD (degenerative disc disease), cervical   . DDD (degenerative disc disease), lumbosacral   . 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 Dysphasia functional--  speech therapy   post op  radical neck dissection 07-21-2014--  changed diet to no chewy food , softer foods , states with the changes swallowed okay without any issues  . Glaucoma    both eyes  . History of gastroesophageal reflux (GERD)   . History of kidney stones    right side--  per CT from [facility] 01-17-2015 non-obstructing  . History of radiation therapy    09-04-2014 to 10-13-2014  Right retromolar region (tumor bed) and  right neck/  60Gy in 30 fractions to tumor bed and 54Gy in 30 fractions to neck  . History of radiation to head and neck region 09-04-2014 to  10-13-2014   right retromolar region and right neck  60 Gy  . History of syncope    a. Event monitor 11/17: No evidence of significant AVB or > 3 second pause.; plan ILR if recurrent syncope in the future  . History of tracheostomy    post op radical neck dissection  07-21-2014--  post op tracheostomy on 07-25-2014  decannulated and sutured 07-29-2014  . Hypertension   . Macular degeneration    right eye only  . MRSA (methicillin resistant staph aureus) culture positive   . OA (osteoarthritis)   . Organic impotence   . Pneumonia 12/2015  . Pulmonary nodule    benign  and stable per last CT  . Radiation-induced dermatitis   . Squamous cell carcinoma of retromolar trigone [facility]) oncologist-  dr Isidore Moos   Stage IVB, pT1b, pN0, Grade 2, +PNI, no LVSI---  S/P  RADICAL NECK DISSECTION AND RADIATION  . Thoracic ascending aortic aneurysm ([place]) 03/28/2016   Chest CTA 9/17: 4.6 cm fusiform ascending thoracic aortic aneurysm // Chest CTA 4/18: Ascending thoracic aortic aneurysm 4.2 cm >> repeat 1 year  . 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 Transitional cell carcinoma of  bladder [facility]) urologist-- dr Junious Silk   High - grade  . Venous insufficiency      Social History   Socioeconomic History  . Marital status: Single    Spouse name: Not on file  . Number of children: 2  . Years of education: Not on file  . Highest education level: Not on file  Occupational History  . Not on file  Social Needs  . Financial resource strain: Not on file  . Food insecurity:    Worry: Not on file    Inability: Not on file  . Transportation needs:    Medical: Not on file    Non-medical: Not on file  Tobacco Use  . Smoking status: Never Smoker  . Smokeless tobacco: Never Used  Substance and Sexual Activity  . Alcohol  use: Yes    Comment: vodka 3-4 daily   (Hx alcohol withdrawal )  . Drug use: No  . Sexual activity: Not on file  Lifestyle  . Physical activity:    Days per week: Not on file    Minutes per session: Not on file  . Stress: Not on file  Relationships  . Social connections:    Talks on phone: Not on file    Gets together: Not on file    Attends religious service: Not on file    Active member of club or organization: Not on file    Attends meetings of clubs or organizations: Not on file    Relationship status: Not on file  . Intimate partner violence:    Fear of current or ex partner: Not on file    Emotionally abused: Not on file    Physically abused: Not on file    Forced sexual activity: Not on file  Other Topics Concern  . Not on file  Social History Narrative   Patient is divorced with 2 children.   Patient has never smoked. Patient has never used smokeless tobacco.    Patient drinks gin or vodka on a daily basis.   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 CATARACT EXTRACTION W/ INTRAOCULAR LENS  IMPLANT, BILATERAL    . COLONOSCOPY N/A 07/23/2016   Procedure: COLONOSCOPY;  Surgeon: Wilford Corner, MD;  Location: [facility];  Service: Endoscopy;  Laterality: N/A;  . CYSTOSCOPY N/A 02/02/2015   Procedure: CYSTOSCOPY, INSTILLATION OF MITOMYCIN C;  Surgeon: Lowella Bandy, MD;  Location: [facility];  Service: Urology;  Laterality: N/A;  . CYSTOSCOPY WITH BIOPSY N/A 03/27/2015   Procedure: CYSTOSCOPY WITH BLADDER  BIOPSY WITH FULGERATION;  Surgeon: Festus Aloe, MD;  Location: [facility];  Service: Urology;  Laterality: N/A;  . ESOPHAGOGASTRODUODENOSCOPY N/A 07/23/2016   Procedure: ESOPHAGOGASTRODUODENOSCOPY (EGD);  Surgeon: Wilford Corner, MD;  Location: [facility] ENDOSCOPY;  Service: Endoscopy;  Laterality:  N/A;  . INGUINAL HERNIA REPAIR Bilateral right 1988//  left 1970's  . LAMINECTOMY WITH POSTERIOR LATERAL ARTHRODESIS LEVEL 1  09-01-2003   L3 -5 LAMINECTOMY W/ DECOMPRESSION AND FUSION L4-5  . MAXILLECTOMY Right 07/21/14,   [facility]   Radical Neck Dissection, Maxillectomy, Right Marginal Mandibulectomy, dental extractions, Parascauplar fasciocutaeous Free flap reconstruction  . RETINAL DETACHMENT REPAIR W/ SCLERAL BUCKLE LE  11-03-2000  . ROTATOR CUFF REPAIR Right 12-11-2000  . tonsil biopsy Right 05/24/14  . TRANSURETHRAL RESECTION OF BLADDER TUMOR N/A  02/02/2015   Procedure: TRANSURETHRAL RESECTION OF BLADDER TUMOR (TURBT);  Surgeon: Lowella Bandy, MD;  Location: [facility];  Service: Urology;  Laterality: N/A;    Family History  Problem Relation Age of Onset  . Cancer Mother        lung ca  . Alcohol abuse Sister   . Hypertension Neg Hx        family hx  . Sudden death Neg Hx        famiylhx  . Heart attack Neg Hx     No Known Allergies  Current Outpatient Medications on File Prior to Visit  Medication Sig Dispense Refill  . brimonidine-timolol (COMBIGAN) 0.2-0.5 % ophthalmic solution Place 1 drop into both eyes 2 (two) times daily.     Marland Kitchen DHEA 50 MG TABS Take 1 tablet by mouth daily.    . feeding supplement, ENSURE ENLIVE, (ENSURE ENLIVE) LIQD Take 237 mLs by mouth 2 (two) times daily between meals. 237 mL 12  . fluconazole (DIFLUCAN) 10 MG/ML suspension Take 20 mL by mouth today, then 10 mL daily for 6 more days. 80 mL 0  . furosemide (LASIX) 40 MG tablet Take 1 tablet (40 mg total) by mouth daily. 180 tablet 2  . latanoprost (XALATAN) 0.005 % ophthalmic solution Place 1 drop into both eyes every evening.   4  . Lutein-Zeaxanthin 15-0.7 MG CAPS Take 1 tablet by mouth daily.    . Melatonin 10 MG TABS Take 10 mg by mouth at bedtime as needed (for sleep).    . sodium chloride (OCEAN) 0.65 % nasal spray Place 2 sprays into the nose as needed for congestion.     . sodium  fluoride (DENTA 5000 PLUS) 1.1 % CREA dental cream Apply a thin ribbon of gel to tooth brush. Brush teeth for 2 minutes. Spit out excess. Repeat nightly. 51 g 0  . triamcinolone cream (KENALOG) 0.1 % Apply topically 2 (two) times daily. 453.6 g 2  . zolpidem (AMBIEN) 5 MG tablet Take 1 tablet (5 mg total) at bedtime by mouth. 30 tablet 0   No current facility-administered medications on file prior to visit.     BP (!) 88/68 (BP Location: Right Arm, Patient Position: Sitting, Cuff Size: Normal)   Pulse 72   Temp 98.1 F (36.7 C) (Oral)   Wt 128 lb (58.1 kg)   SpO2 99%   BMI 19.46 kg/m      Review of Systems  Constitutional: Positive for activity change, appetite change and fatigue. Negative for chills and fever.  HENT: Negative for congestion, dental problem, ear pain, hearing loss, sore throat, tinnitus, trouble swallowing and voice change.   Eyes: Negative for pain, discharge and visual disturbance.  Respiratory: Negative for cough, chest tightness, wheezing and stridor.   Cardiovascular: Positive for leg swelling. Negative for chest pain and palpitations.  Gastrointestinal: Negative for abdominal distention, abdominal pain, blood in stool, constipation, diarrhea, nausea and vomiting.  Genitourinary: Negative for difficulty urinating, discharge, flank pain, genital sores, hematuria and urgency.  Musculoskeletal: Negative for arthralgias, back pain, gait problem, joint swelling, myalgias and neck stiffness.  Skin: Positive for wound. Negative for rash.  Neurological: Positive for dizziness and syncope. Negative for speech difficulty, weakness, numbness and headaches.  Hematological: Negative for adenopathy. Does not bruise/bleed easily.  Psychiatric/Behavioral: Negative for behavioral problems and dysphoric mood. The patient is not nervous/anxious.        Objective:   Physical Exam  Constitutional: He is oriented to person, place,  and time. He appears well-developed.  Blood  pressure in the 90/60 range  HENT:  Head: Normocephalic.  Right Ear: External ear normal.  Left Ear: External ear normal.  Eyes: Conjunctivae and EOM are normal.  Neck: Normal range of motion.  Cardiovascular: Normal rate and normal heart sounds.  Pulmonary/Chest: Breath sounds normal.  Abdominal: Bowel sounds are normal.  Musculoskeletal: Normal range of motion. He exhibits edema. He exhibits no tenderness.  Neurological: He is alert and oriented to person, place, and time.  Skin:  Abrasions and superficial skin tears involving his anterior lower extremities. Wounds were cleaned and dressed  Psychiatric: He has a normal mood and affect. His behavior is normal.          Assessment & Plan:   History of essential hypertension Orthostatic hypotension in the setting of profound weight loss and protein calorie malnutrition Multiple skin tears anterior lower legs.  Wounds were cleaned and dressed.  Local wound care discussed Diabetes.   Lab Results  Component Value Date   HGBA1C 5.7 03/26/2017   Follow-up 3 months  ENT follow-up as scheduled  Nuvia Hileman Pilar Plate

## 2017-10-22 NOTE — Patient Instructions (Signed)
Limit your sodium (Salt) intake  Return in 3 months for follow-up  Call or return to clinic prn if these symptoms worsen or fail to improve as anticipated.  

## 2017-11-01 ENCOUNTER — Other Ambulatory Visit: Payer: Self-pay

## 2017-11-01 ENCOUNTER — Emergency Department (HOSPITAL_COMMUNITY): Payer: Medicare Other

## 2017-11-01 ENCOUNTER — Emergency Department (HOSPITAL_COMMUNITY)
Admission: EM | Admit: 2017-11-01 | Discharge: 2017-11-01 | Disposition: A | Discharge status: Home / Self Care | Payer: Medicare Other | Attending: Emergency Medicine | Admitting: Emergency Medicine

## 2017-11-01 DIAGNOSIS — E871 Hypo-osmolality and hyponatremia: Secondary | ICD-10-CM | POA: Diagnosis not present

## 2017-11-01 DIAGNOSIS — D630 Anemia in neoplastic disease: Secondary | ICD-10-CM | POA: Diagnosis not present

## 2017-11-01 DIAGNOSIS — I10 Essential (primary) hypertension: Secondary | ICD-10-CM | POA: Diagnosis not present

## 2017-11-01 DIAGNOSIS — E860 Dehydration: Secondary | ICD-10-CM | POA: Diagnosis not present

## 2017-11-01 DIAGNOSIS — E119 Type 2 diabetes mellitus without complications: Secondary | ICD-10-CM | POA: Insufficient documentation

## 2017-11-01 DIAGNOSIS — Z79899 Other long term (current) drug therapy: Secondary | ICD-10-CM | POA: Insufficient documentation

## 2017-11-01 DIAGNOSIS — R569 Unspecified convulsions: Secondary | ICD-10-CM | POA: Diagnosis not present

## 2017-11-01 DIAGNOSIS — Z85820 Personal history of malignant melanoma of skin: Secondary | ICD-10-CM | POA: Insufficient documentation

## 2017-11-01 DIAGNOSIS — C9110 Chronic lymphocytic leukemia of B-cell type not having achieved remission: Secondary | ICD-10-CM | POA: Diagnosis not present

## 2017-11-01 DIAGNOSIS — R4182 Altered mental status, unspecified: Secondary | ICD-10-CM | POA: Insufficient documentation

## 2017-11-01 LAB — COMPREHENSIVE METABOLIC PANEL
ALBUMIN: 2.7 g/dL — AB (ref 3.5–5.0)
ALK PHOS: 88 U/L (ref 38–126)
ALT: 9 U/L — ABNORMAL LOW (ref 17–63)
AST: 30 U/L (ref 15–41)
Anion gap: 14 (ref 5–15)
BILIRUBIN TOTAL: 1 mg/dL (ref 0.3–1.2)
BUN: 12 mg/dL (ref 6–20)
CALCIUM: 8.8 mg/dL — AB (ref 8.9–10.3)
CO2: 40 mmol/L — ABNORMAL HIGH (ref 22–32)
Chloride: 73 mmol/L — ABNORMAL LOW (ref 101–111)
Creatinine, Ser: 0.96 mg/dL (ref 0.61–1.24)
GFR calc Af Amer: >60 mL/min (ref >60)
GFR calc non Af Amer: >60 mL/min (ref >60)
GLUCOSE: 135 mg/dL — AB (ref 65–99)
POTASSIUM: 3.4 mmol/L — AB (ref 3.5–5.1)
Sodium: 127 mmol/L — ABNORMAL LOW (ref 135–145)
TOTAL PROTEIN: 6 g/dL — AB (ref 6.5–8.1)

## 2017-11-01 LAB — PROTIME-INR
INR: 1.16
Prothrombin Time: 14.7 seconds (ref 11.4–15.2)

## 2017-11-01 LAB — DIFFERENTIAL
BASOS ABS: 0 10*3/uL (ref 0.0–0.1)
Basophils Relative: 0 %
Eosinophils Absolute: 0 10*3/uL (ref 0.0–0.7)
Eosinophils Relative: 1 %
LYMPHS ABS: 3.3 10*3/uL (ref 0.7–4.0)
Lymphocytes Relative: 42 %
Monocytes Absolute: 0.6 10*3/uL (ref 0.1–1.0)
Monocytes Relative: 8 %
NEUTROS PCT: 49 %
Neutro Abs: 3.9 10*3/uL (ref 1.7–7.7)

## 2017-11-01 LAB — CBG MONITORING, ED: Glucose-Capillary: 121 mg/dL — ABNORMAL HIGH (ref 65–99)

## 2017-11-01 LAB — URINALYSIS, ROUTINE W REFLEX MICROSCOPIC
Bilirubin Urine: NEGATIVE
Glucose, UA: NEGATIVE mg/dL
HGB URINE DIPSTICK: NEGATIVE
Ketones, ur: NEGATIVE mg/dL
LEUKOCYTES UA: NEGATIVE
Nitrite: NEGATIVE
Protein, ur: NEGATIVE mg/dL
SPECIFIC GRAVITY, URINE: 1.01 (ref 1.005–1.030)
pH: 9 — ABNORMAL HIGH (ref 5.0–8.0)

## 2017-11-01 LAB — CBC
HCT: 33.8 % — ABNORMAL LOW (ref 39.0–52.0)
HEMOGLOBIN: 11.8 g/dL — AB (ref 13.0–17.0)
MCH: 37 pg — ABNORMAL HIGH (ref 26.0–34.0)
MCHC: 34.9 g/dL (ref 30.0–36.0)
MCV: 106 fL — ABNORMAL HIGH (ref 78.0–100.0)
Platelets: 229 10*3/uL (ref 150–400)
RBC: 3.19 MIL/uL — AB (ref 4.22–5.81)
RDW: 13.3 % (ref 11.5–15.5)
WBC: 7.9 10*3/uL (ref 4.0–10.5)

## 2017-11-01 LAB — I-STAT TROPONIN, ED: Troponin i, poc: 0.02 ng/mL (ref 0.00–0.08)

## 2017-11-01 LAB — I-STAT CG4 LACTIC ACID, ED: Lactic Acid, Venous: 1.99 mmol/L — ABNORMAL HIGH (ref 0.5–1.9)

## 2017-11-01 LAB — APTT: aPTT: 29 seconds (ref 24–36)

## 2017-11-01 MED ORDER — SODIUM CHLORIDE 0.9 % IV BOLUS
500.0000 mL | Freq: Once | INTRAVENOUS | Status: AC
  Administered 2017-11-01: 500 mL via INTRAVENOUS

## 2017-11-01 NOTE — Discharge Instructions (Addendum)
Please make sure you follow-up with your primary care doctor this week.  It is very important that you increase your nutritional intake and drink plenty of fluids.  I would like for you to follow-up with your primary care doctor this week as well.  You need your blood work retested this week including your sodium levels.  Return to the ED if you develop any worsening symptoms.  Your CT of your head was unremarkable today.

## 2017-11-01 NOTE — ED Notes (Signed)
ED Provider at bedside. 

## 2017-11-01 NOTE — ED Triage Notes (Addendum)
Pt to ed via POV with family with c/o of a seizure today at 1400. Pt was sitting in chair demonstrating/ showing his shins to his family when his eyes rolled back in his head and his hand and arms began to shake for about 5-10 seconds. Pt also states that last week he was at cancer center and fainted. Pt has stage one CLL, bladder cancer, melanoma, basal cell carcinoma but no chemo/radiation right now.  Pt admits to being weak, no appetite, and not being to keep anything down. Pt has lost 8 lbs since last January.

## 2017-11-01 NOTE — ED Notes (Signed)
Patient ambulated down the hallway and back with assistance. Patient tolerated it well.

## 2017-11-02 ENCOUNTER — Other Ambulatory Visit: Payer: Self-pay

## 2017-11-02 ENCOUNTER — Telehealth: Payer: Self-pay

## 2017-11-02 DIAGNOSIS — R531 Weakness: Secondary | ICD-10-CM

## 2017-11-02 DIAGNOSIS — E119 Type 2 diabetes mellitus without complications: Secondary | ICD-10-CM

## 2017-11-02 DIAGNOSIS — M150 Primary generalized (osteo)arthritis: Secondary | ICD-10-CM

## 2017-11-02 DIAGNOSIS — T07XXXA Unspecified multiple injuries, initial encounter: Secondary | ICD-10-CM

## 2017-11-02 DIAGNOSIS — R55 Syncope and collapse: Secondary | ICD-10-CM

## 2017-11-02 DIAGNOSIS — M159 Polyosteoarthritis, unspecified: Secondary | ICD-10-CM

## 2017-11-02 NOTE — ED Provider Notes (Signed)
[facility] DEPT Provider Note   CSN: [id number] Arrival date & time: 11/01/17  1450     History   Chief Complaint Chief Complaint  Patient presents with  . Seizures    HPI Timothy Kim is a 84 y.o. male.  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  They report that this afternoon patient was talking to them then he stopped talking and was shaking his hands bilaterally and his eyes rolled back of his head.  States that he was only out for a few seconds and then return to baseline immediately and picked back up on his conversation that he was having.  They Timothy any postictal state.  Patient has no history of seizures.  They do report that patient did have a syncopal episode 3 weeks ago while in the cancer center.  Was seen by his primary care doctor at that time.  Patient sustained some abrasions to his lower leg.  He was given doxycycline and these were dressed.  Patient was noted to be hypotensive.  Does have a history of hypertension but was recently taken off of his blood pressure medicines due to patient's poor nutritional status.  Patient has not been staying hydrated or consuming any nutrition.  He states this is secondary to his radical neck dissection and radiation.  Patient is supposed to be on Ensure but is not taking this regularly.  Family at bedside states that they have had home health come out to help patient but he refuses to eat.  Primary care felt that patient's hypotension was secondary to poor nutritional status.  They did decrease his Lasix.  At this time patient denies any symptoms.  He denies any headache, vision changes, fevers, chest pain, shortness of  breath.  Again patient has no history of seizures and does not take any seizure medications.  He has never followed up with a neurologist.   Pt denies any fever, chill, ha, vision changes, lightheadedness, congestion, neck pain, cp, sob, cough, abd pain, n/v/d, urinary symptoms, change in bowel habits, melena, hematochezia, lower extremity paresthesias.    Past Medical History:  Diagnosis Date  . Alcohol abuse   . Allergic rhinitis   . Anemia in neoplastic disease    chronic  . Bilateral lower extremity edema    feet  . Borderline diabetes   . BPH (benign prostatic hypertrophy)   . CLL (chronic lymphocytic leukemia) [facility]) oncologist-  dr Heath Lark ([facility])   dx Jan 2014 --- Stage 1--  currently asymptomatic as of Apr 2016 and No active disease  . DDD (degenerative disc disease), cervical   . DDD (degenerative disc disease), lumbosacral   . 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 Dysphasia functional--  speech therapy   post op  radical neck dissection 07-21-2014--  changed diet to no chewy food , softer foods , states with the changes swallowed okay without any issues  . Glaucoma    both eyes  . History of gastroesophageal reflux (GERD)   . History of kidney stones    right side--  per CT from [facility] 01-17-2015 non-obstructing  . History of radiation therapy    09-04-2014 to 10-13-2014  Right retromolar region (tumor bed) and right neck/  60Gy in 30 fractions to tumor bed and 54Gy in 30 fractions to neck  . History of radiation to head and neck region 09-04-2014 to  10-13-2014   right retromolar region and right neck  60 Gy  . History of syncope    a. Event monitor 11/17: No evidence of significant AVB or > 3 second pause.; plan ILR if recurrent syncope in the future  . History of  tracheostomy    post op radical neck dissection  07-21-2014--  post op tracheostomy on 07-25-2014  decannulated and sutured 07-29-2014  . Hypertension   . Macular degeneration    right eye only  . MRSA (methicillin resistant staph aureus) culture positive   . OA (osteoarthritis)   . Organic impotence   . Pneumonia 12/2015  . Pulmonary nodule    benign  and stable per last CT  . Radiation-induced dermatitis   . Squamous cell carcinoma of retromolar trigone [facility]) oncologist-  dr Isidore Moos   Stage IVB, pT1b, pN0, Grade 2, +PNI, no LVSI---  S/P  RADICAL NECK DISSECTION AND RADIATION  . Thoracic ascending aortic aneurysm ([place]) 03/28/2016   Chest CTA 9/17: 4.6 cm fusiform ascending thoracic aortic aneurysm // Chest CTA 4/18: Ascending thoracic aortic aneurysm 4.2 cm >> repeat 1 year  . 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 Transitional cell carcinoma of bladder [facility]) urologist-- dr Junious Silk   High - grade  . Venous insufficiency     Patient Active Problem List   Diagnosis Date Noted  . Drug-induced hypotension 10/20/2017  . Laceration of multiple sites of skin 10/20/2017  . Dysphagia 10/16/2016  . Abdominal pain, generalized 07/23/2016  . Bilateral leg edema 03/28/2016  . Thoracic ascending aortic aneurysm ([place]) 03/28/2016  . Syncope 03/19/2016  . Weakness generalized 03/19/2016  . Macrocytic anemia 03/19/2016  . Pneumonia 03/18/2016  . Drug-induced hypokalemia 10/16/2015  . History of bladder cancer 02/08/2015  . Anemia in neoplastic disease 10/17/2014  . 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 Squamous cell carcinoma of retromolar trigone ([place]) 06/07/2014  . Abnormal weight loss 05/31/2014  . Cancer of the lip, oral cavity, and pharynx ([place]) 05/30/2014  . CLL (chronic lymphocytic leukemia) ([place]) 07/07/2012  . ALLERGIC RHINITIS 02/09/2008  . 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 Osteoarthritis 06/09/2007  . LOW BACK PAIN 06/09/2007  . Diabetes  mellitus without complication ([place]) 75/64/3329  . Essential hypertension 03/03/2007  . GERD 03/03/2007  . BENIGN PROSTATIC HYPERTROPHY 03/03/2007    Past Surgical History:  Procedure Laterality Date  . CATARACT EXTRACTION W/ INTRAOCULAR LENS  IMPLANT, BILATERAL    . COLONOSCOPY N/A 07/23/2016   Procedure: COLONOSCOPY;  Surgeon: Wilford Corner, MD;  Location: [facility] ENDOSCOPY;  Service: Endoscopy;  Laterality: N/A;  . CYSTOSCOPY N/A 02/02/2015   Procedure: CYSTOSCOPY, INSTILLATION OF MITOMYCIN C;  Surgeon: Lowella Bandy, MD;  Location: [facility];  Service: Urology;  Laterality: N/A;  . CYSTOSCOPY WITH BIOPSY N/A 03/27/2015   Procedure: CYSTOSCOPY WITH BLADDER  BIOPSY WITH FULGERATION;  Surgeon: Festus Aloe, MD;  Location[facility];  Service: Urology;  Laterality: N/A;  . ESOPHAGOGASTRODUODENOSCOPY N/A 07/23/2016   Procedure: ESOPHAGOGASTRODUODENOSCOPY (EGD);  Surgeon: Wilford Corner, MD;  Location: [facility] ENDOSCOPY;  Service: Endoscopy;  Laterality: N/A;  . INGUINAL HERNIA REPAIR Bilateral right 1988//  left  1970's  . LAMINECTOMY WITH POSTERIOR LATERAL ARTHRODESIS LEVEL 1  09-01-2003   L3 -5 LAMINECTOMY W/ DECOMPRESSION AND FUSION L4-5  . MAXILLECTOMY Right 07/21/14,   [facility]   Radical Neck Dissection, Maxillectomy, Right Marginal Mandibulectomy, dental extractions, Parascauplar fasciocutaeous Free flap reconstruction  . RETINAL DETACHMENT REPAIR W/ SCLERAL BUCKLE LE  11-03-2000  . ROTATOR CUFF REPAIR Right 12-11-2000  . tonsil biopsy Right 05/24/14  . TRANSURETHRAL RESECTION OF BLADDER TUMOR N/A 02/02/2015   Procedure: TRANSURETHRAL RESECTION OF BLADDER TUMOR (TURBT);  Surgeon: Lowella Bandy, MD;  Location: [facility];  Service: Urology;  Laterality: N/A;        Home Medications    Prior to Admission medications   Medication Sig Start Date End Date Taking? Authorizing Provider  brimonidine-timolol (COMBIGAN) 0.2-0.5 % ophthalmic solution Place 1  drop into both eyes 2 (two) times daily.    Yes [provider]  Cholecalciferol (VITAMIN D PO) Take 1 tablet by mouth daily.   Yes [provider]  Cyanocobalamin (VITAMIN B 12 PO) Take 1 tablet by mouth daily.   Yes [provider]  DHEA 50 MG TABS Take 1 tablet by mouth daily.   Yes [provider]  feeding supplement, ENSURE ENLIVE, (ENSURE ENLIVE) LIQD Take 237 mLs by mouth 2 (two) times daily between meals. 03/22/16  Yes Hosie Poisson, MD  furosemide (LASIX) 40 MG tablet Take 1 tablet (40 mg total) by mouth daily. 03/26/17  Yes Marletta Lor, MD  latanoprost (XALATAN) 0.005 % ophthalmic solution Place 1 drop into both eyes every evening.  06/01/14  Yes [provider]  Lutein-Zeaxanthin 15-0.7 MG CAPS Take 1 tablet by mouth daily.   Yes [provider]  Melatonin 10 MG TABS Take 10 mg by mouth at bedtime as needed (for sleep).   Yes [provider]  RHOPRESSA 0.02 % SOLN Place 1 drop into the right eye daily. 09/28/17  Yes [provider]  sodium chloride (OCEAN) 0.65 % nasal spray Place 2 sprays into the nose as needed for congestion.  07/30/14  Yes [provider]  triamcinolone cream (KENALOG) 0.1 % Apply topically 2 (two) times daily. 02/26/17  Yes Marletta Lor, MD  fluconazole (DIFLUCAN) 10 MG/ML suspension Take 20 mL by mouth today, then 10 mL daily for 6 more days. Patient not taking: Reported on 11/01/2017 10/02/17   Eppie Gibson, MD  sodium fluoride (DENTA 5000 PLUS) 1.1 % CREA dental cream Apply a thin ribbon of gel to tooth brush. Brush teeth for 2 minutes. Spit out excess. Repeat nightly. Patient not taking: Reported on 11/01/2017 07/03/15   Lenn Cal, DDS  zolpidem (AMBIEN) 5 MG tablet Take 1 tablet (5 mg total) at bedtime by mouth. Patient not taking: Reported on 11/01/2017 05/07/17   Marletta Lor, MD    Family History Family History  Problem Relation Age of Onset  . Cancer  Mother        lung ca  . Alcohol abuse Sister   . Hypertension Neg Hx        family hx  . Sudden death Neg Hx        famiylhx  . Heart attack Neg Hx     Social History Social History   Tobacco Use  . Smoking status: Never Smoker  . Smokeless tobacco: Never Used  Substance Use Topics  . Alcohol use: Yes    Comment: vodka 3-4 daily   (Hx alcohol withdrawal )  . Drug  use: No     Allergies   Patient has no known allergies.   Review of Systems Review of Systems  All other systems reviewed and are negative.    Physical Exam Updated Vital Signs BP (!) 145/77   Pulse 60   Temp (!) 97.4 F (36.3 C) (Oral)   Resp 11   Ht 5\' 7"  (1.702 m)   Wt 58.1 kg (128 lb)   SpO2 100%   BMI 20.05 kg/m   Physical Exam Constitutional: He is oriented to person, place, and time. He appears well-developed and well-nourished. No distress.  HENT:  Head: Normocephalic and atraumatic.  Right Ear: External ear normal.  Left Ear: External ear normal.  Mouth/Throat: Oropharynx is clear and moist and mucous membranes are normal.  Eyes: Conjunctivae and EOM are normal. Pupils are equal, round, and reactive to light. Right eye exhibits no discharge. Left eye exhibits no discharge. No scleral icterus.  Neck: Normal range of motion. Neck supple. No thyromegaly present.  No cervical midline tenderness. No paraspinal tenderness. Full ROM.  Cardiovascular: Normal rate, regular rhythm, normal heart sounds and intact distal pulses.  Exam reveals no gallop and no friction rub.   No murmur heard. Pulmonary/Chest: Effort normal and breath sounds normal. No respiratory distress. He has no wheezes.  Abdominal: Soft. Bowel sounds are normal. He exhibits no distension. There is no tenderness. There is no rebound.  Musculoskeletal: Normal range of motion.  Full ROM. No deformity noted. Radial pulses 2+ bilaterally. DP pulses 2+ bilaterally. Sensation intact. Denies any pain at this time. No midline tenderness  of T-spine and L-spine. Pelvis is stable.  Lymphadenopathy:    He has no cervical adenopathy.  Neurological: He is alert and oriented to person, place, and time. He has normal strength. No cranial nerve deficit or sensory deficit. Coordination normal.  The patient is alert, attentive, and oriented x 3. Speech is clear. Cranial nerve II-VII grossly intact. Negative pronator drift. Sensation intact. Reflexes 2+ and symmetric at biceps, triceps, knees, and ankles. Rapid alternating movement and fine finger movements intact. No pronator drift. General weakness but no focal deficits.  Skin: Skin is warm and dry. Capillary refill takes less than 2 seconds.    She has bilateral abrasions to the shin with healing noted.  No signs of infection at this time including erythema, purulent drainage. Nursing note and vitals reviewed.    ED Treatments / Results  Labs (all labs ordered are listed, but only abnormal results are displayed) Labs Reviewed  CBC - Abnormal; Notable for the following components:      Result Value   RBC 3.19 (*)    Hemoglobin 11.8 (*)    HCT 33.8 (*)    MCV 106.0 (*)    MCH 37.0 (*)    All other components within normal limits  COMPREHENSIVE METABOLIC PANEL - Abnormal; Notable for the following components:   Sodium 127 (*)    Potassium 3.4 (*)    Chloride 73 (*)    CO2 40 (*)    Glucose, Bld 135 (*)    Calcium 8.8 (*)    Total Protein 6.0 (*)    Albumin 2.7 (*)    ALT 9 (*)    All other components within normal limits  URINALYSIS, ROUTINE W REFLEX MICROSCOPIC - Abnormal; Notable for the following components:   pH 9.0 (*)    All other components within normal limits  CBG MONITORING, ED - Abnormal; Notable for the following components:  Glucose-Capillary 121 (*)    All other components within normal limits  I-STAT CG4 LACTIC ACID, ED - Abnormal; Notable for the following components:   Lactic Acid, Venous 1.99 (*)    All other components within normal limits    PROTIME-INR  APTT  DIFFERENTIAL  I-STAT TROPONIN, ED    EKG EKG Interpretation  Date/Time:  Sunday Nov 01 2017 15:39:21 EDT Ventricular Rate:  62 PR Interval:    QRS Duration: 121 QT Interval:  541 QTC Calculation: 536 R Axis:   -7 Text Interpretation:  Sinus rhythm Atrial premature complex Nonspecific intraventricular conduction delay Inferolateral infarct, old Probable anteroseptal infarct, old When compared to prior, no significant changtes seen,  NO STEMI Confirmed by Antony Blackbird [phone]) on 11/01/2017 4:27:57 PM   Radiology Ct Head Wo Contrast  Result Date: 11/01/2017 CLINICAL DATA:  Seizure EXAM: CT HEAD WITHOUT CONTRAST TECHNIQUE: Contiguous axial images were obtained from the base of the skull through the vertex without intravenous contrast. COMPARISON:  10/09/2017, 03/18/2016 FINDINGS: Brain: No acute territorial infarction, hemorrhage or intracranial mass. Moderate atrophy. Moderate to marked small vessel ischemic changes of the white matter. Old lacunar infarct left basal ganglia. Stable ventricle size. Vascular: No hyperdense vessels.  Carotid vascular calcification. Skull: No fracture. Minimal fluid in the right mastoids. Anterior positioning of the right mandibular head as before. Partially visualized clips and surgical changes in the right perimandibular region. Sinuses/Orbits: Mucosal thickening in the maxillary sphenoid and ethmoid sinuses. No acute orbital abnormality. Bilateral lens extraction Other: None IMPRESSION: 1. No CT evidence for acute intracranial abnormality. 2. Atrophy with small vessel ischemic changes of the white matter Electronically Signed   By: Donavan Foil M.D.   On: 11/01/2017 18:50    Procedures Procedures (including critical care time)  Medications Ordered in ED Medications  sodium chloride 0.9 % bolus 500 mL (0 mLs Intravenous Stopped 11/01/17 1909)     Initial Impression / Assessment and Plan / ED Course  I have reviewed the triage  vital signs and the nursing notes.  Pertinent labs & imaging results that were available during my care of the patient were reviewed by me and considered in my medical decision making (see chart for details).     Patient presents to the emergency department today with family at bedside for evaluation of possible seizure-like activity versus syncopal episode.  Patient did have a syncopal episode 4 weeks ago and was evaluated by PCP.  This was secondary to hypotension and malnutrition.  What family describes today patient had no postictal state.  It seems that patient may have had a another syncopal episode.  Episode does not correspond with grand mal seizure.  Patient is is slightly hypotensive which appears at his baseline over the past few weeks.  Otherwise vital signs are reassuring.  He is not tachycardic or febrile.  Patient denies any infectious symptoms.  On exam patient has no focal neuro deficits.  Lungs clear to auscultation bilaterally.  Heart regular rate and rhythm.  Neurovascularly intact in all extremities.   Lab work seems consistent with dehydration.  Hyponatremia noted at 127 which was 128 3 weeks ago.  Patient has no significant hypokalemia.  Elevated bicarb with history of same.  Decrease in chloride at 73.  Albumin is 2.7.  Liver enzymes are normal.  Normal kidney function.  Mild elevation of lactic acid of 1.99.  No leukocytosis.  Hemoglobin at baseline.  Negative troponin.  UA shows no signs of infection.  CBG is 121.  EKG performed shows no signs of acute ischemia and appears similar to prior tracing.  Initially patient was orthostatic.  He was given 500cc fluid bolus.  Blood pressure has improved and been stable.  Repeat orthostatics with improvement.  Patient denies any associated lightheadedness or dizziness upon standing.  He was able to ambulate with assistance which is typical for patient at baseline.  CT scan was performed to rule out any metastasis to brain which  was unremarkable.  I suspect the patient's episode today was likely a syncopal episode even how it was described by family.  The patient's syncope seems secondary to dehydration and malnutrition.  Patient states that he would like to be discharged to go home and will increase his oral intake.  Family seems agreeable this plan.  They will follow-up with primary care doctor this week.  Patient would like to avoid admission at this time.  Pt is hemodynamically stable, in NAD, & able to ambulate in the ED. Evaluation does not show pathology that would require ongoing emergent intervention or inpatient treatment. I explained the diagnosis to the patient. Pain has been managed & has no complaints prior to dc. Pt is comfortable with above plan and is stable for discharge at this time. All questions were answered prior to disposition. Strict return precautions for f/u to the ED were discussed. Encouraged follow up with PCP.  Pt has been seen by my attending who is agreeable with the above plan .   Final Clinical Impressions(s) / ED Diagnoses   Final diagnoses:  Hyponatremia  Dehydration  Seizure-like activity [facility])    ED Discharge Orders    None       Theodoro, Koval 11/02/17 0148    Tegeler, Gwenyth Allegra, MD 11/02/17 1125

## 2017-11-02 NOTE — Telephone Encounter (Signed)
Agree.  Please set up for home health care to assist with all aspects of daily living

## 2017-11-02 NOTE — Telephone Encounter (Signed)
Pt's son [name] [facility]) came in to office with concerns about pt's health and home care. He states pt has had several falls recently. Pt was seen in ED yesterday for syncopal episode and dehydration. Per son the pt has not been bathing (3 weeks), eating (has not eaten for several days but is drinking small amount of Ensure) or cleaning wounds. The bandages placed at [facility] were "stuck to his skin" and the ED had to change them. Son states that ED recommends wound care and additional home health assistance. The son is very concerned about pt's overall health. They believe syncopal episode possibly from lack of nutrition and dehydration. Per son pt is unable to care for himself and his partner is not capable either. The son is asking for a home health aide to assist with bathing, meal prep and wound care.   Dr. Raliegh Ip - Please advise. Thanks!

## 2017-11-03 ENCOUNTER — Telehealth: Payer: Self-pay | Admitting: Internal Medicine

## 2017-11-03 NOTE — Telephone Encounter (Signed)
Noted  

## 2017-11-03 NOTE — Telephone Encounter (Signed)
Copied from [place] [phone]. Topic: Quick Communication - See Telephone Encounter >> Nov 03, 2017  2:57 PM Bea Graff, NT wrote: CRM for notification. See Telephone encounter for: 11/03/17. Caryl Pina with [facility] is calling and states that pt is declining home health at this time but Caryl Pina states while speaking with the pt that he may be ready for hospice. She states that she would like to speak with the doctor regarding hospice for this pt. CB#: [phone]

## 2017-11-04 NOTE — Telephone Encounter (Signed)
Okay for hospice consult.

## 2017-11-05 NOTE — Telephone Encounter (Signed)
Caryl Pina stated that the pt sons was upset that the pt declined home health. Caryl Pina said that he did not know what to do they have tried to offer everything. Pt stated to Caryl Pina that "he has no quality of life" and he is ready to die and is only holding on for his children but he is tired. Caryl Pina stated that the son stated that "he will not let him give up and die" and was upset that home health nurses was not forcing him to wash or eat. Caryl Pina stated that it wasn't their job or place to force a pt to do something they did not want to do all they could do is educated. Son finally calmed down per [place]. I will inform Dr.Kwiatkowski.

## 2017-11-05 NOTE — Telephone Encounter (Signed)
Orders placed for Home Health w/[facility]. They will contact pt's son to schedule evaluation.

## 2017-11-09 ENCOUNTER — Ambulatory Visit: Payer: Self-pay | Admitting: Internal Medicine

## 2017-11-10 DIAGNOSIS — I872 Venous insufficiency (chronic) (peripheral): Secondary | ICD-10-CM | POA: Diagnosis not present

## 2017-11-10 DIAGNOSIS — S80922D Unspecified superficial injury of left lower leg, subsequent encounter: Secondary | ICD-10-CM | POA: Diagnosis not present

## 2017-11-10 DIAGNOSIS — E119 Type 2 diabetes mellitus without complications: Secondary | ICD-10-CM | POA: Diagnosis not present

## 2017-11-10 DIAGNOSIS — S80921D Unspecified superficial injury of right lower leg, subsequent encounter: Secondary | ICD-10-CM | POA: Diagnosis not present

## 2017-11-10 DIAGNOSIS — S80911D Unspecified superficial injury of right knee, subsequent encounter: Secondary | ICD-10-CM | POA: Diagnosis not present

## 2017-11-10 DIAGNOSIS — R1310 Dysphagia, unspecified: Secondary | ICD-10-CM | POA: Diagnosis not present

## 2017-11-11 ENCOUNTER — Ambulatory Visit (INDEPENDENT_AMBULATORY_CARE_PROVIDER_SITE_OTHER): Payer: Medicare Other | Admitting: Internal Medicine

## 2017-11-11 ENCOUNTER — Encounter: Payer: Self-pay | Admitting: Internal Medicine

## 2017-11-11 VITALS — BP 110/60 | HR 81 | Temp 97.8°F | Wt 126.0 lb

## 2017-11-11 DIAGNOSIS — I872 Venous insufficiency (chronic) (peripheral): Secondary | ICD-10-CM | POA: Diagnosis not present

## 2017-11-11 DIAGNOSIS — S80922D Unspecified superficial injury of left lower leg, subsequent encounter: Secondary | ICD-10-CM | POA: Diagnosis not present

## 2017-11-11 DIAGNOSIS — S80921D Unspecified superficial injury of right lower leg, subsequent encounter: Secondary | ICD-10-CM | POA: Diagnosis not present

## 2017-11-11 DIAGNOSIS — R600 Localized edema: Secondary | ICD-10-CM

## 2017-11-11 DIAGNOSIS — E119 Type 2 diabetes mellitus without complications: Secondary | ICD-10-CM | POA: Diagnosis not present

## 2017-11-11 DIAGNOSIS — R531 Weakness: Secondary | ICD-10-CM | POA: Diagnosis not present

## 2017-11-11 DIAGNOSIS — R1310 Dysphagia, unspecified: Secondary | ICD-10-CM | POA: Diagnosis not present

## 2017-11-11 DIAGNOSIS — S80911D Unspecified superficial injury of right knee, subsequent encounter: Secondary | ICD-10-CM | POA: Diagnosis not present

## 2017-11-11 NOTE — Progress Notes (Signed)
Subjective:    Patient ID: Timothy Kim, male    DOB: 06-Mar-1930, 84 y.o.   MRN: [id number]  HPI  Wt Readings from Last 3 Encounters:  11/11/17 126 lb (57.2 kg)  11/01/17 128 lb (58.1 kg)  10/22/17 128 lb (25.59 kg)   84 year old patient who was seen yesterday by home health care.  The patient has been followed for traumatic wounds involving his lower anterior legs.  Dressing changes were performed yesterday.  Over the past day he has had worsening swelling involving the feet and ankles distal to the bandages involving both lower extremities. He continues to have weakness anorexia. Since his last visit here he was seen in the ED after a seizure.  Chronic hyponatremia noted.  He has been off all blood pressure medications and diuretic therapy  Past Medical History:  Diagnosis Date  . Alcohol abuse   . Allergic rhinitis   . Anemia in neoplastic disease    chronic  . Bilateral lower extremity edema    feet  . Borderline diabetes   . BPH (benign prostatic hypertrophy)   . [facility]-  dr Heath Lark ([facility])   dx Jan 2014 --- Stage 1--  currently asymptomatic as of Apr 2016 and No active disease  . DDD (degenerative disc disease), cervical   . DDD (degenerative disc disease), lumbosacral   . 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 Dysphasia functional--  speech therapy   post op  radical neck dissection 07-21-2014--  changed diet to no chewy food , softer foods , states with the changes swallowed okay without any issues  . Glaucoma    both eyes  . History of gastroesophageal reflux (GERD)   . History of kidney stones    right side--  per CT from [facility] 01-17-2015 non-obstructing  . History of radiation therapy    09-04-2014 to 10-13-2014   Right retromolar region (tumor bed) and right neck/  60Gy in 30 fractions to tumor bed and 54Gy in 30 fractions to neck  . History of radiation to head and neck region 09-04-2014 to  10-13-2014   right retromolar region and right neck  60 Gy  . History of syncope    a. Event monitor 11/17: No evidence of significant AVB or > 3 second pause.; plan ILR if recurrent syncope in the future  . History of tracheostomy    post op radical neck dissection  07-21-2014--  post op tracheostomy on 07-25-2014  decannulated and sutured 07-29-2014  . Hypertension   . Macular degeneration    right eye only  . MRSA (methicillin resistant staph aureus) culture positive   . OA (osteoarthritis)   . Organic impotence   . Pneumonia 12/2015  . Pulmonary nodule    benign  and stable per last CT  . Radiation-induced dermatitis   . Squamous cell carcinoma of retromolar trigone [facility]) oncologist-  dr Isidore Moos   Stage IVB, pT1b, pN0, Grade 2, +PNI, no LVSI---  S/P  RADICAL NECK DISSECTION AND RADIATION  . Thoracic ascending aortic aneurysm ([place]) 03/28/2016   Chest CTA 9/17: 4.6 cm fusiform ascending thoracic aortic aneurysm // Chest CTA 4/18: Ascending thoracic aortic aneurysm 4.2 cm >> repeat 1 year  . 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 Transitional cell carcinoma of bladder [facility]) urologist-- dr Junious Silk   High - grade  . Venous insufficiency      Social History   Socioeconomic History  . Marital status: Single    Spouse name: Not on file  . Number of children: 2  . Years of education: Not on file  . Highest education level: Not on file  Occupational History  . Not on file  Social Needs  . Financial resource strain: Not on file  . Food insecurity:    Worry: Not on file    Inability: Not on file  . Transportation needs:    Medical: Not on file    Non-medical: Not on file  Tobacco Use  . Smoking status: Never Smoker  . Smokeless tobacco: Never Used    Substance and Sexual Activity  . Alcohol use: Yes    Comment: vodka 3-4 daily   (Hx alcohol withdrawal )  . Drug use: No  . Sexual activity: Not on file  Lifestyle  . Physical activity:    Days per week: Not on file    Minutes per session: Not on file  . Stress: Not on file  Relationships  . Social connections:    Talks on phone: Not on file    Gets together: Not on file    Attends religious service: Not on file    Active member of club or organization: Not on file    Attends meetings of clubs or organizations: Not on file    Relationship status: Not on file  . Intimate partner violence:    Fear of current or ex partner: Not on file    Emotionally abused: Not on file    Physically abused: Not on file    Forced sexual activity: Not on file  Other Topics Concern  . Not on file  Social History Narrative   Patient is divorced with 2 children.   Patient has never smoked. Patient has never used smokeless tobacco.    Patient drinks gin or vodka on a daily basis.   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 CATARACT EXTRACTION W/ INTRAOCULAR LENS  IMPLANT, BILATERAL    . COLONOSCOPY N/A 07/23/2016   Procedure: COLONOSCOPY;  Surgeon: Wilford Corner, MD;  Location: [facility] ENDOSCOPY;  Service: Endoscopy;  Laterality: N/A;  . CYSTOSCOPY N/A 02/02/2015   Procedure: CYSTOSCOPY, INSTILLATION OF MITOMYCIN C;  Surgeon: Lowella Bandy, MD;  Location: [facility];  Service: Urology;  Laterality: N/A;  . CYSTOSCOPY WITH BIOPSY N/A 03/27/2015   Procedure: CYSTOSCOPY WITH BLADDER  BIOPSY WITH FULGERATION;  Surgeon: Festus Aloe, MD;  Location: [facility] [place];  Service: Urology;  Laterality: N/A;  . ESOPHAGOGASTRODUODENOSCOPY N/A 07/23/2016   Procedure: ESOPHAGOGASTRODUODENOSCOPY (EGD);  Surgeon: Wilford Corner, MD;  Location: [facility]  ENDOSCOPY;  Service: Endoscopy;  Laterality: N/A;  . INGUINAL HERNIA REPAIR Bilateral right 1988//  left 1970's  . LAMINECTOMY WITH POSTERIOR LATERAL ARTHRODESIS LEVEL 1  09-01-2003   L3 -5 LAMINECTOMY W/ DECOMPRESSION AND FUSION L4-5  . MAXILLECTOMY Right 07/21/14,   [facility]   Radical Neck Dissection, Maxillectomy, Right Marginal Mandibulectomy, dental extractions, Parascauplar fasciocutaeous Free flap reconstruction  . RETINAL DETACHMENT REPAIR W/ SCLERAL BUCKLE LE  11-03-2000  . ROTATOR CUFF REPAIR Right 12-11-2000  . tonsil  biopsy Right 05/24/14  . TRANSURETHRAL RESECTION OF BLADDER TUMOR N/A 02/02/2015   Procedure: TRANSURETHRAL RESECTION OF BLADDER TUMOR (TURBT);  Surgeon: Lowella Bandy, MD;  Location: [facility];  Service: Urology;  Laterality: N/A;    Family History  Problem Relation Age of Onset  . Cancer Mother        lung ca  . Alcohol abuse Sister   . Hypertension Neg Hx        family hx  . Sudden death Neg Hx        famiylhx  . Heart attack Neg Hx     No Known Allergies  Current Outpatient Medications on File Prior to Visit  Medication Sig Dispense Refill  . brimonidine-timolol (COMBIGAN) 0.2-0.5 % ophthalmic solution Place 1 drop into both eyes 2 (two) times daily.     . Cholecalciferol (VITAMIN D PO) Take 1 tablet by mouth daily.    . Cyanocobalamin (VITAMIN B 12 PO) Take 1 tablet by mouth daily.    Marland Kitchen DHEA 50 MG TABS Take 1 tablet by mouth daily.    . feeding supplement, ENSURE ENLIVE, (ENSURE ENLIVE) LIQD Take 237 mLs by mouth 2 (two) times daily between meals. 237 mL 12  . latanoprost (XALATAN) 0.005 % ophthalmic solution Place 1 drop into both eyes every evening.   4  . Lutein-Zeaxanthin 15-0.7 MG CAPS Take 1 tablet by mouth daily.    . Melatonin 10 MG TABS Take 10 mg by mouth at bedtime as needed (for sleep).    . RHOPRESSA 0.02 % SOLN Place 1 drop into the right eye daily.    . sodium chloride (OCEAN) 0.65 % nasal spray Place 2 sprays into the nose as  needed for congestion.     . sodium fluoride (DENTA 5000 PLUS) 1.1 % CREA dental cream Apply a thin ribbon of gel to tooth brush. Brush teeth for 2 minutes. Spit out excess. Repeat nightly. (Patient not taking: Reported on 11/01/2017) 51 g 0  . triamcinolone cream (KENALOG) 0.1 % Apply topically 2 (two) times daily. 453.6 g 2   No current facility-administered medications on file prior to visit.     BP 110/60 (BP Location: Left Arm, Patient Position: Sitting, Cuff Size: Normal) Comment: Faint  Pulse 81   Temp 97.8 F (36.6 C) (Oral)   Wt 126 lb (57.2 kg)   SpO2 99%   BMI 19.73 kg/m      Review of Systems  Constitutional: Positive for activity change, appetite change and fatigue.  Cardiovascular: Positive for leg swelling.  Neurological: Positive for weakness.       Objective:   Physical Exam  Constitutional: He appears well-developed and well-nourished. No distress.  Clinically looks improved compared to his last visit Reading daily paper  Weight 126  Blood pressure 86/60  Skin:  Bilateral dressings noted involving the mid lower legs.  Edematous changes distal to the bandage involving ankles and feet          Assessment & Plan:   Lower extremity edema.  Aggravated by constriction of bandages in the mid calf area.  These bandages were removed and new bandages reapplied loosely Hypotension.  Multifactorial.  All blood pressure medications and diuretic therapy have been discontinued. Chronic hyponatremia.  Modest fluid restriction encouraged History of syncope/seizure.  In view of patient's hypotension suspect seizure secondary to cerebral hypoperfusion.  Follow-up 4 weeks Patient will report any clinical worsening Hopeful lower extremity edema will improve without diuretic therapy with elevation and avoiding  aggravating factors. Compression hose when lower extremity abrasions resolved   Repeat serum sodium in 4 weeks.  [name]

## 2017-11-11 NOTE — Patient Instructions (Addendum)
Limit your sodium (Salt) intake  Moderate your fluid intake  Return in 4 weeks for follow-up  Keep legs elevated as much as possible

## 2017-11-12 ENCOUNTER — Telehealth: Payer: Self-pay | Admitting: Internal Medicine

## 2017-11-12 DIAGNOSIS — S80911D Unspecified superficial injury of right knee, subsequent encounter: Secondary | ICD-10-CM | POA: Diagnosis not present

## 2017-11-12 DIAGNOSIS — E119 Type 2 diabetes mellitus without complications: Secondary | ICD-10-CM | POA: Diagnosis not present

## 2017-11-12 DIAGNOSIS — R1310 Dysphagia, unspecified: Secondary | ICD-10-CM | POA: Diagnosis not present

## 2017-11-12 DIAGNOSIS — S80921D Unspecified superficial injury of right lower leg, subsequent encounter: Secondary | ICD-10-CM | POA: Diagnosis not present

## 2017-11-12 DIAGNOSIS — I872 Venous insufficiency (chronic) (peripheral): Secondary | ICD-10-CM | POA: Diagnosis not present

## 2017-11-12 DIAGNOSIS — S80922D Unspecified superficial injury of left lower leg, subsequent encounter: Secondary | ICD-10-CM | POA: Diagnosis not present

## 2017-11-12 NOTE — Telephone Encounter (Signed)
Copied from [place] [id number]. Topic: Quick Communication - See Telephone Encounter >> Nov 12, 2017  4:41 PM Bea Graff, NT wrote: CRM for notification. See Telephone encounter for: 11/12/17. Will with [facility] is calling to get verbal orders for OT with a frequency of 1 time a week for 1 week and 2 times a week for 2 weeks. CB#: [phone]

## 2017-11-13 NOTE — Telephone Encounter (Signed)
Okay for OT? ?

## 2017-11-17 ENCOUNTER — Telehealth: Payer: Self-pay | Admitting: Internal Medicine

## 2017-11-17 DIAGNOSIS — R634 Abnormal weight loss: Secondary | ICD-10-CM

## 2017-11-17 DIAGNOSIS — I872 Venous insufficiency (chronic) (peripheral): Secondary | ICD-10-CM | POA: Diagnosis not present

## 2017-11-17 DIAGNOSIS — S80922D Unspecified superficial injury of left lower leg, subsequent encounter: Secondary | ICD-10-CM | POA: Diagnosis not present

## 2017-11-17 DIAGNOSIS — S80911D Unspecified superficial injury of right knee, subsequent encounter: Secondary | ICD-10-CM | POA: Diagnosis not present

## 2017-11-17 DIAGNOSIS — R1310 Dysphagia, unspecified: Secondary | ICD-10-CM | POA: Diagnosis not present

## 2017-11-17 DIAGNOSIS — C148 Malignant neoplasm of overlapping sites of lip, oral cavity and pharynx: Secondary | ICD-10-CM

## 2017-11-17 DIAGNOSIS — S80921D Unspecified superficial injury of right lower leg, subsequent encounter: Secondary | ICD-10-CM | POA: Diagnosis not present

## 2017-11-17 DIAGNOSIS — E119 Type 2 diabetes mellitus without complications: Secondary | ICD-10-CM | POA: Diagnosis not present

## 2017-11-17 DIAGNOSIS — R531 Weakness: Secondary | ICD-10-CM

## 2017-11-17 MED ORDER — MIDODRINE HCL 2.5 MG PO TABS: 2.5000 mg | ORAL_TABLET | Freq: Three times a day (TID) | ORAL | 2 refills | Status: DC

## 2017-11-17 NOTE — Telephone Encounter (Signed)
Spoke with pt and advised of new medication. Pt does have someone who can pick it up from the pharmacy for him. Advised him that this should help him feel better as it is likely his low BP that is making him feel bad and pass out. Pt very open to trying new medication. Hospice order placed.   Advised pt that I will call to check on him later this week, or he can call us if he needs anything sooner.

## 2017-11-17 NOTE — Telephone Encounter (Signed)
Durward Fortes, RN Case Manager with [facility] called to say "I was out seeing the patient today. 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 Today his BP was initially 84/60. As I was finishing up and rechecking his BP, it was 80/50 and he passed out. I called EMS and when they got there, he refused treatment and transport to the ED. He told me that he was ready to die, that his is not half the man that he was, he is just existing. She says he does not have a DNR in the home and he did not want me to call his [place]. At this point, he is not home health appropriate and will need to transition to hospice. Will you have the provider to call me with what he wants [place] to do from this point." I advised this will be sent to the provider and someone will call with his recommendation. Ashlee's phone number is [phone].

## 2017-11-17 NOTE — Telephone Encounter (Signed)
Dr. K - Please advise. Thanks! 

## 2017-11-17 NOTE — Telephone Encounter (Signed)
Hospice consultation please Please make patient aware that I have a new prescription available at his drugstore to increase his blood pressure that he needs to start taking 3 times daily Suggest follow-up office visit tomorrow or next week

## 2017-11-17 NOTE — Telephone Encounter (Signed)
Okay for PT

## 2017-11-17 NOTE — Telephone Encounter (Signed)
This encounter was created in error - please disregard.

## 2017-11-17 NOTE — Addendum Note (Signed)
Addended by: Beckie Busing on: 11/17/2017 02:20 PM   Modules accepted: Orders

## 2017-11-17 NOTE — Telephone Encounter (Signed)
Verbal orders given  

## 2017-11-18 ENCOUNTER — Telehealth: Payer: Self-pay | Admitting: Internal Medicine

## 2017-11-18 DIAGNOSIS — S80922D Unspecified superficial injury of left lower leg, subsequent encounter: Secondary | ICD-10-CM | POA: Diagnosis not present

## 2017-11-18 DIAGNOSIS — E119 Type 2 diabetes mellitus without complications: Secondary | ICD-10-CM | POA: Diagnosis not present

## 2017-11-18 DIAGNOSIS — R1310 Dysphagia, unspecified: Secondary | ICD-10-CM | POA: Diagnosis not present

## 2017-11-18 DIAGNOSIS — I872 Venous insufficiency (chronic) (peripheral): Secondary | ICD-10-CM | POA: Diagnosis not present

## 2017-11-18 DIAGNOSIS — S80921D Unspecified superficial injury of right lower leg, subsequent encounter: Secondary | ICD-10-CM | POA: Diagnosis not present

## 2017-11-18 DIAGNOSIS — S80911D Unspecified superficial injury of right knee, subsequent encounter: Secondary | ICD-10-CM | POA: Diagnosis not present

## 2017-11-18 NOTE — Telephone Encounter (Signed)
Yes I will be attending

## 2017-11-18 NOTE — Telephone Encounter (Signed)
Please advise 

## 2017-11-18 NOTE — Telephone Encounter (Signed)
Copied from [place] [id number]. Topic: Quick Communication - See Telephone Encounter >> Nov 18, 2017 12:36 PM Arletha Grippe wrote: CRM for notification. See Telephone encounter for: 11/18/17. Katharine Look from hospice called - wants to know if Dr Raliegh Ip will be attending.  Cb is [phone]

## 2017-11-19 ENCOUNTER — Telehealth: Payer: Self-pay | Admitting: Internal Medicine

## 2017-11-19 NOTE — Telephone Encounter (Signed)
Per TE pt has already declined Hospice services. Nothing further needed.

## 2017-11-19 NOTE — Telephone Encounter (Signed)
Copied from [place] [id number]. Topic: General - Other >> Nov 19, 2017  4:08 PM Valla Leaver wrote: Reason for CRM: Dr. Lyman Speller with [facility] calling to notify Dr. Raliegh Ip that the patient is not eating well but otherwise he does not have active problems. Patient did not want to change anything in his medical care and declined hospice care.

## 2017-11-19 NOTE — Telephone Encounter (Signed)
Hospice notified 

## 2017-11-19 NOTE — Telephone Encounter (Signed)
This was a recommendation by the home health nurse that came to visit pt. Dr. Raliegh Ip agreed to place referral. Pt may decline services at this time if he wishes.  LMTCB

## 2017-11-19 NOTE — Telephone Encounter (Signed)
Copied from [place] [id number]. Topic: Quick Communication - See Telephone Encounter >> Nov 19, 2017  4:28 PM Rutherford Nail, NT wrote: CRM for notification. See Telephone encounter for: 11/19/17. Timothy Kim with [facility] calling and wanted to let Dr Burnice Logan know that the patient did not admit to hospice services today. Patient wants to continue with all of his regular work ups and continue how things are currently. States if there are any questions, you can contact the referral center at [phone].

## 2017-11-19 NOTE — Telephone Encounter (Signed)
Noted! Spoke to the Timothy Kim and he stated Hospice gave him a choice to stick with [facility] or switch to them. Timothy Kim chose [facility] since he was familiar with the staff.

## 2017-11-19 NOTE — Telephone Encounter (Signed)
Copied from [place] [phone]. Topic: Quick Communication - See Telephone Encounter >> Nov 19, 2017  3:17 PM Bea Graff, NT wrote: CRM for notification. See Telephone encounter for: 11/19/17. Timothy Kim, patients girlfriend calling and states she would like Dr. Burnice Logan or his nurse to please call this pt regarding the order for hospice that was placed for him. A lady from hospice named Pam came out to his home today and pt was unaware that hospice would be coming out. Please call [phone].

## 2017-11-23 DIAGNOSIS — S80921D Unspecified superficial injury of right lower leg, subsequent encounter: Secondary | ICD-10-CM | POA: Diagnosis not present

## 2017-11-23 DIAGNOSIS — S80911D Unspecified superficial injury of right knee, subsequent encounter: Secondary | ICD-10-CM | POA: Diagnosis not present

## 2017-11-23 DIAGNOSIS — E119 Type 2 diabetes mellitus without complications: Secondary | ICD-10-CM | POA: Diagnosis not present

## 2017-11-23 DIAGNOSIS — S80922D Unspecified superficial injury of left lower leg, subsequent encounter: Secondary | ICD-10-CM | POA: Diagnosis not present

## 2017-11-23 DIAGNOSIS — R1310 Dysphagia, unspecified: Secondary | ICD-10-CM | POA: Diagnosis not present

## 2017-11-23 DIAGNOSIS — I872 Venous insufficiency (chronic) (peripheral): Secondary | ICD-10-CM | POA: Diagnosis not present

## 2017-11-24 ENCOUNTER — Telehealth: Payer: Self-pay | Admitting: Internal Medicine

## 2017-11-24 NOTE — Telephone Encounter (Signed)
FYI only.

## 2017-11-24 NOTE — Telephone Encounter (Signed)
Copied from [place] [phone]. Topic: Quick Communication - See Telephone Encounter >> Nov 24, 2017  1:10 PM Synthia Innocent wrote: CRM for notification. See Telephone encounter for: 11/24/17. FYI [facility] calling and states patient has refused visits.

## 2017-12-02 ENCOUNTER — Telehealth: Payer: Self-pay

## 2017-12-02 NOTE — Telephone Encounter (Signed)
Phone call placed to patient to offer to schedule a visit with Palliative Care.Explained palliative services to patient. Scheduled for 12/08/17.

## 2017-12-08 ENCOUNTER — Other Ambulatory Visit: Payer: Medicare Other | Admitting: Hospice and Palliative Medicine

## 2017-12-08 DIAGNOSIS — Z515 Encounter for palliative care: Secondary | ICD-10-CM

## 2017-12-08 NOTE — Progress Notes (Signed)
PALLIATIVE CARE CONSULT VISIT   PATIENT NAME: Timothy Kim DOB: 02/04/1930 MRN: [id number]  PRIMARY CARE PROVIDER: Marletta Lor, MD  REFERRING PROVIDER: Marletta Lor, MD [place], [place] [address]  RESPONSIBLE PARTY: self     RECOMMENDATIONS and PLAN:  1.lower leg edema: provided edema wear website/phone number so that he can apply easier to apply compression socks vs. TED hose. Encouraged elevation. 2. Insomnia: already using melatonin 10 mg and is waking at 2 am and is having difficulty going back to sleep. Recommend trying Remeron at HS 7.5 mg. May need to titrate upwards to 15 mg. 3. Protein calorie malnutrition: recommend: greek yogurt, starting Remeron as above for appetite stimulant and consider discussing G tube with his GI physician. He weighed 174# at his baseline weight in 2016, 143# 6 mos ago and is now down to 126#.  4. ACP: Completed DNR form. Introduced the MOST form, leaving behind for patient to read over. Will complete on another visit. Discussed the difference between hospice and palliative care.   I spent 45 minutes providing this consultation,  from 11:20 to 12:05pm. More than 50% of the time in this consultation was spent interviewing and assessing patient and coordinating communication.   HISTORY OF PRESENT ILLNESS:  Timothy Kim is an 84 y.o.  male with multiple medical problems . He was evaluated for hospice and deemed to have > 6 mos prognosis, thus, Palliative Care was asked to help address symptom management and goals of care.   CODE STATUS: DNR  PPS: weak 50% HOSPICE ELIGIBILITY/DIAGNOSIS: TBD  PAST MEDICAL HISTORY:  Past Medical History:  Diagnosis Date  . Alcohol abuse   . Allergic rhinitis   . Anemia in neoplastic disease    chronic  . Bilateral lower extremity edema    feet  . Borderline diabetes   . BPH (benign prostatic hypertrophy)   . CLL (chronic lymphocytic leukemia) [facility]) oncologist-  dr Heath Lark ([facility])   dx Jan 2014 --- Stage 1--  currently asymptomatic as of Apr 2016 and No active disease  . DDD (degenerative disc disease), cervical   . DDD (degenerative disc disease), lumbosacral   . 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 Dysphasia functional--  speech therapy   post op  radical neck dissection 07-21-2014--  changed diet to no chewy food , softer foods , states with the changes swallowed okay without any issues  . Glaucoma    both eyes  . History of gastroesophageal reflux (GERD)   . History of kidney stones    right side--  per CT from [facility] 01-17-2015 non-obstructing  . History of radiation therapy    09-04-2014 to 10-13-2014  Right retromolar region (tumor bed) and right neck/  60Gy in 30 fractions to tumor bed and 54Gy in 30 fractions to neck  . History of radiation to head and neck region 09-04-2014 to  10-13-2014   right retromolar region and right neck  60 Gy  . History of syncope    a. Event monitor 11/17: No evidence of significant AVB or > 3 second pause.; plan ILR if recurrent syncope in the future  . History of tracheostomy    post op radical neck dissection  07-21-2014--  post op tracheostomy on 07-25-2014  decannulated and sutured  07-29-2014  . Hypertension   . Macular degeneration    right eye only  . MRSA (methicillin resistant staph aureus) culture positive   . OA (osteoarthritis)   . Organic impotence   . Pneumonia 12/2015  . Pulmonary nodule    benign  and stable per last CT  . Radiation-induced dermatitis   . Squamous cell carcinoma of retromolar trigone [facility]) oncologist-  dr Isidore Moos   Stage IVB, pT1b, pN0, Grade 2, +PNI, no LVSI---  S/P  RADICAL NECK DISSECTION AND RADIATION  . Thoracic ascending aortic aneurysm ([place]) 03/28/2016   Chest CTA  9/17: 4.6 cm fusiform ascending thoracic aortic aneurysm // Chest CTA 4/18: Ascending thoracic aortic aneurysm 4.2 cm >> repeat 1 year  . 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 Transitional cell carcinoma of bladder [facility]) urologist-- dr Junious Silk   High - grade  . Venous insufficiency     SOCIAL HX:  Social History   Tobacco Use  . Smoking status: Never Smoker  . Smokeless tobacco: Never Used  Substance Use Topics  . Alcohol use: Yes    Comment: vodka 3-4 daily   (Hx alcohol withdrawal )    ALLERGIES: No Known Allergies   PERTINENT MEDICATIONS:  Outpatient Encounter Medications as of 12/08/2017  Medication Sig  . brimonidine-timolol (COMBIGAN) 0.2-0.5 % ophthalmic solution Place 1 drop into both eyes 2 (two) times daily.   . Cholecalciferol (VITAMIN D PO) Take 1 tablet by mouth daily.  . Cyanocobalamin (VITAMIN B 12 PO) Take 1 tablet by mouth daily.  Marland Kitchen DHEA 50 MG TABS Take 1 tablet by mouth daily.  . feeding supplement, ENSURE ENLIVE, (ENSURE ENLIVE) LIQD Take 237 mLs by mouth 2 (two) times daily between meals.  . latanoprost (XALATAN) 0.005 % ophthalmic solution Place 1 drop into both eyes every evening.   . Lutein-Zeaxanthin 15-0.7 MG CAPS Take 1 tablet by mouth daily.  . Melatonin 10 MG TABS Take 10 mg by mouth at bedtime as needed (for sleep).  . midodrine (PROAMATINE) 2.5 MG tablet Take 1 tablet (2.5 mg total) by mouth 3 (three) times daily with meals.  . RHOPRESSA 0.02 % SOLN Place 1 drop into the right eye daily.  . sodium chloride (OCEAN) 0.65 % nasal spray Place 2 sprays into the nose as needed for congestion.   . sodium fluoride (DENTA 5000 PLUS) 1.1 % CREA dental cream Apply a thin ribbon of gel to tooth brush. Brush teeth for 2 minutes. Spit out excess. Repeat nightly. (Patient not taking: Reported on 11/01/2017)  . triamcinolone cream (KENALOG) 0.1 % Apply topically 2 (two) times daily.   No facility-administered encounter  medications on file as of 12/08/2017.     PHYSICAL EXAM:   General: Alert, frail Caucasian male with left facial swelling sitting in chair in NAD Pulmonary: No cough or wheeze Extremities: bilateral lower extremity edema; discoloration Skin: thin, fragile Neurological: alert and oriented +generalized weakness  Nathanial Rancher, NP

## 2017-12-10 ENCOUNTER — Ambulatory Visit (INDEPENDENT_AMBULATORY_CARE_PROVIDER_SITE_OTHER): Payer: Medicare Other | Admitting: Internal Medicine

## 2017-12-10 ENCOUNTER — Encounter: Payer: Self-pay | Admitting: Internal Medicine

## 2017-12-10 VITALS — BP 116/74 | HR 67 | Temp 97.9°F | Wt 127.2 lb

## 2017-12-10 DIAGNOSIS — I10 Essential (primary) hypertension: Secondary | ICD-10-CM

## 2017-12-10 DIAGNOSIS — C062 Malignant neoplasm of retromolar area: Secondary | ICD-10-CM

## 2017-12-10 DIAGNOSIS — R531 Weakness: Secondary | ICD-10-CM

## 2017-12-10 DIAGNOSIS — R55 Syncope and collapse: Secondary | ICD-10-CM

## 2017-12-10 MED ORDER — MIDODRINE HCL 10 MG PO TABS: 10.0000 mg | ORAL_TABLET | Freq: Three times a day (TID) | ORAL | 3 refills | Status: DC

## 2017-12-10 NOTE — Progress Notes (Signed)
Subjective:    Patient ID: Timothy Kim, male    DOB: 03/20/1930, 84 y.o.   MRN: [id number]  HPI  84 year old patient seen today in follow-up. 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  He has generalized weakness and chronic malnutrition. He has a prior history of hypertension but due to orthostatic hypotension medications have been discontinued.  He has chronic lower extremity edema which has been stable off diuretic therapy His main problem continues to be orthostatic dizziness which has resulted in multiple falls.  Presently he is on midodrine 5 mg 3 times daily. Since his last visit his weight has been stable.  He has a history of diet-controlled diabetes   Past Medical History:  Diagnosis Date  . Alcohol abuse   . Allergic rhinitis   . Anemia in neoplastic disease    chronic  . Bilateral lower extremity edema    feet  . Borderline diabetes   . BPH (benign prostatic hypertrophy)   . CLL (chronic lymphocytic leukemia) [facility]) oncologist-  dr Heath Lark ([facility])   dx Jan 2014 --- Stage 1--  currently asymptomatic as of Apr 2016 and No active disease  . DDD (degenerative disc disease), cervical   . DDD (degenerative disc disease), lumbosacral   . 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 Dysphasia functional--  speech therapy   post op  radical neck dissection 07-21-2014--  changed diet to no chewy food , softer foods , states with the changes swallowed okay without any issues  . Glaucoma    both eyes  . History of gastroesophageal reflux (GERD)   . History of kidney stones    right side--  per CT from [facility] 01-17-2015 non-obstructing  . History of radiation therapy    09-04-2014 to 10-13-2014  Right retromolar  region (tumor bed) and right neck/  60Gy in 30 fractions to tumor bed and 54Gy in 30 fractions to neck  . History of radiation to head and neck region 09-04-2014 to  10-13-2014   right retromolar region and right neck  60 Gy  . History of syncope    a. Event monitor 11/17: No evidence of significant AVB or > 3 second pause.; plan ILR if recurrent syncope in the future  . History of tracheostomy    post op radical neck dissection  07-21-2014--  post op tracheostomy on 07-25-2014  decannulated and sutured 07-29-2014  . Hypertension   . Macular degeneration    right eye only  . MRSA (methicillin resistant staph aureus) culture positive   . OA (osteoarthritis)   . Organic impotence   . Pneumonia 12/2015  . Pulmonary nodule    benign  and stable per last CT  . Radiation-induced dermatitis   . Squamous cell carcinoma of retromolar trigone [facility]) oncologist-  dr Isidore Moos   Stage IVB, pT1b, pN0, Grade 2, +PNI, no LVSI---  S/P  RADICAL NECK DISSECTION AND RADIATION  . Thoracic ascending aortic aneurysm ([facility]) 03/28/2016   Chest CTA 9/17: 4.6 cm fusiform ascending thoracic aortic aneurysm // Chest CTA 4/18: Ascending thoracic aortic aneurysm 4.2 cm >> repeat 1 year  . 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 Transitional cell carcinoma of bladder (  [facility]) urologist-- dr Junious Silk   High - grade  . Venous insufficiency      Social History   Socioeconomic History  . Marital status: Single    Spouse name: Not on file  . Number of children: 2  . Years of education: Not on file  . Highest education level: Not on file  Occupational History  . Not on file  Social Needs  . Financial resource strain: Not on file  . Food insecurity:    Worry: Not on file    Inability: Not on file  . Transportation needs:    Medical: Not on file    Non-medical: Not on file  Tobacco Use  . Smoking status: Never Smoker  . Smokeless tobacco: Never Used  Substance and Sexual  Activity  . Alcohol use: Yes    Comment: vodka 3-4 daily   (Hx alcohol withdrawal )  . Drug use: No  . Sexual activity: Not on file  Lifestyle  . Physical activity:    Days per week: Not on file    Minutes per session: Not on file  . Stress: Not on file  Relationships  . Social connections:    Talks on phone: Not on file    Gets together: Not on file    Attends religious service: Not on file    Active member of club or organization: Not on file    Attends meetings of clubs or organizations: Not on file    Relationship status: Not on file  . Intimate partner violence:    Fear of current or ex partner: Not on file    Emotionally abused: Not on file    Physically abused: Not on file    Forced sexual activity: Not on file  Other Topics Concern  . Not on file  Social History Narrative   Patient is divorced with 2 children.   Patient has never smoked. Patient has never used smokeless tobacco.    Patient drinks gin or vodka on a daily basis.   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 CATARACT EXTRACTION W/ INTRAOCULAR LENS  IMPLANT, BILATERAL    . COLONOSCOPY N/A 07/23/2016   Procedure: COLONOSCOPY;  Surgeon: Wilford Corner, MD;  Location: [facility] ENDOSCOPY;  Service: Endoscopy;  Laterality: N/A;  . CYSTOSCOPY N/A 02/02/2015   Procedure: CYSTOSCOPY, INSTILLATION OF MITOMYCIN C;  Surgeon: Lowella Bandy, MD;  Location: [facility];  Service: Urology;  Laterality: N/A;  . CYSTOSCOPY WITH BIOPSY N/A 03/27/2015   Procedure: CYSTOSCOPY WITH BLADDER  BIOPSY WITH FULGERATION;  Surgeon: Festus Aloe, MD;  Location[facility]  Service: Urology;  Laterality: N/A;  . ESOPHAGOGASTRODUODENOSCOPY N/A 07/23/2016   Procedure: ESOPHAGOGASTRODUODENOSCOPY (EGD);  Surgeon: Wilford Corner, MD;  Location: [facility] ENDOSCOPY;  Service:  Endoscopy;  Laterality: N/A;  . INGUINAL HERNIA REPAIR Bilateral right 1988//  left 1970's  . LAMINECTOMY WITH POSTERIOR LATERAL ARTHRODESIS LEVEL 1  09-01-2003   L3 -5 LAMINECTOMY W/ DECOMPRESSION AND FUSION L4-5  . MAXILLECTOMY Right 07/21/14,    [facility]   Radical Neck Dissection, Maxillectomy, Right Marginal Mandibulectomy, dental extractions, Parascauplar fasciocutaeous Free flap reconstruction  . RETINAL DETACHMENT REPAIR W/ SCLERAL BUCKLE LE  11-03-2000  . ROTATOR CUFF REPAIR Right 12-11-2000  . tonsil biopsy Right 05/24/14  . TRANSURETHRAL RESECTION OF BLADDER TUMOR N/A 02/02/2015  Procedure: TRANSURETHRAL RESECTION OF BLADDER TUMOR (TURBT);  Surgeon: Lowella Bandy, MD;  Location: [facility];  Service: Urology;  Laterality: N/A;    Family History  Problem Relation Age of Onset  . Cancer Mother        lung ca  . Alcohol abuse Sister   . Hypertension Neg Hx        family hx  . Sudden death Neg Hx        famiylhx  . Heart attack Neg Hx     No Known Allergies  Current Outpatient Medications on File Prior to Visit  Medication Sig Dispense Refill  . brimonidine-timolol (COMBIGAN) 0.2-0.5 % ophthalmic solution Place 1 drop into both eyes 2 (two) times daily.     . Cholecalciferol (VITAMIN D PO) Take 1 tablet by mouth daily.    . Cyanocobalamin (VITAMIN B 12 PO) Take 1 tablet by mouth daily.    Marland Kitchen DHEA 50 MG TABS Take 1 tablet by mouth daily.    . feeding supplement, ENSURE ENLIVE, (ENSURE ENLIVE) LIQD Take 237 mLs by mouth 2 (two) times daily between meals. 237 mL 12  . latanoprost (XALATAN) 0.005 % ophthalmic solution Place 1 drop into both eyes every evening.   4  . Lutein-Zeaxanthin 15-0.7 MG CAPS Take 1 tablet by mouth daily.    . Melatonin 10 MG TABS Take 10 mg by mouth at bedtime as needed (for sleep).    . RHOPRESSA 0.02 % SOLN Place 1 drop into the right eye daily.    . sodium chloride (OCEAN) 0.65 % nasal spray Place 2 sprays into the nose as needed for  congestion.     . sodium fluoride (DENTA 5000 PLUS) 1.1 % CREA dental cream Apply a thin ribbon of gel to tooth brush. Brush teeth for 2 minutes. Spit out excess. Repeat nightly. (Patient not taking: Reported on 11/01/2017) 51 g 0  . triamcinolone cream (KENALOG) 0.1 % Apply topically 2 (two) times daily. 453.6 g 2   No current facility-administered medications on file prior to visit.     BP 116/74 (BP Location: Left Arm, Patient Position: Sitting, Cuff Size: Normal)   Pulse 67   Temp 97.9 F (36.6 C) (Oral)   Wt 127 lb 3.2 oz (57.7 kg)   SpO2 95%   BMI 19.92 kg/m     Review of Systems  Constitutional: Positive for appetite change and fatigue. Negative for chills and fever.  HENT: Positive for trouble swallowing. Negative for congestion, dental problem, ear pain, hearing loss, sore throat, tinnitus and voice change.   Eyes: Negative for pain, discharge and visual disturbance.  Respiratory: Negative for cough, chest tightness, wheezing and stridor.   Cardiovascular: Negative for chest pain, palpitations and leg swelling.  Gastrointestinal: Negative for abdominal distention, abdominal pain, blood in stool, constipation, diarrhea, nausea and vomiting.  Genitourinary: Negative for difficulty urinating, discharge, flank pain, genital sores, hematuria and urgency.  Musculoskeletal: Positive for gait problem. Negative for arthralgias, back pain, joint swelling, myalgias and neck stiffness.  Skin: Negative for rash.  Neurological: Positive for weakness. Negative for dizziness, syncope, speech difficulty, numbness and headaches.  Hematological: Negative for adenopathy. Does not bruise/bleed easily.  Psychiatric/Behavioral: Negative for behavioral problems and dysphoric mood. The patient is not nervous/anxious.        Objective:   Physical Exam  Constitutional: He is oriented to person, place, and time. He appears well-developed.  Blood pressure 110/70  HENT:  Head: Normocephalic.  Right  Ear: External  ear normal.  Left Ear: External ear normal.  Eyes: Conjunctivae and EOM are normal.  Neck: Normal range of motion.  Cardiovascular: Normal rate and normal heart sounds.  Pulmonary/Chest: Breath sounds normal.  Abdominal: Bowel sounds are normal.  Musculoskeletal: Normal range of motion. He exhibits edema. He exhibits no tenderness.  Requires assistance to stand from a sitting position  Stable +2 lower extremity edema with stasis changes.  No open ulceration  Neurological: He is alert and oriented to person, place, and time.  Skin:  Scattered ecchymoses over the upper arms and lower extremities  Psychiatric: He has a normal mood and affect. His behavior is normal.          Assessment & Plan:   Orthostatic hypotension.  Probably secondary to diabetic associated autonomic dysfunction.  We will continue up titration admitted drain to a final dose of 10 mg 3 times daily.  Follow-up 6 weeks Malnutrition.  Main issue is swallowing difficulty weight is up 1 pound since his last visit.  Reassess in 6 weeks.  Consider Remeron but main issue seems to be difficulty swallowing not appetite  Marletta Lor

## 2017-12-10 NOTE — Patient Instructions (Signed)
Midodrine 2.5 mg 3 tablets (7.5 mg) 3 times daily, until present supply is completed, then 10 mg 3 times daily  Return in 6 weeks for follow-up  Please attempt to be more active with ambulation with your walker  Be especially careful when you first stand

## 2018-01-26 ENCOUNTER — Ambulatory Visit: Payer: Medicare Other | Admitting: Internal Medicine

## 2018-02-09 ENCOUNTER — Ambulatory Visit: Payer: Self-pay | Admitting: Internal Medicine

## 2018-02-09 ENCOUNTER — Ambulatory Visit: Payer: Medicare Other | Admitting: Internal Medicine

## 2018-02-09 ENCOUNTER — Encounter

## 2018-02-11 ENCOUNTER — Other Ambulatory Visit: Payer: Self-pay | Admitting: Internal Medicine

## 2018-02-12 ENCOUNTER — Telehealth: Payer: Self-pay | Admitting: Internal Medicine

## 2018-02-12 NOTE — Telephone Encounter (Signed)
Noted  

## 2018-02-12 NOTE — Telephone Encounter (Signed)
Lloyd Huger dropped off forms for [employer]  Call patient to pick up forms at: [phone]  Disposition: Dr's folder

## 2018-02-19 NOTE — Telephone Encounter (Signed)
Timothy Kim picked up forms

## 2018-02-19 NOTE — Telephone Encounter (Signed)
No charge. 

## 2018-02-23 ENCOUNTER — Telehealth: Payer: Self-pay

## 2018-02-23 NOTE — Telephone Encounter (Signed)
Phone call placed to patient to offer to schedule a follow up with Palliative Care. Patient declined need at this time and stated he would contact Palliative Care if visit is needed.

## 2018-03-07 ENCOUNTER — Other Ambulatory Visit: Payer: Self-pay | Admitting: Internal Medicine

## 2018-03-21 ENCOUNTER — Emergency Department (HOSPITAL_COMMUNITY): Payer: Medicare Other

## 2018-03-21 ENCOUNTER — Other Ambulatory Visit: Payer: Self-pay

## 2018-03-21 ENCOUNTER — Inpatient Hospital Stay (HOSPITAL_COMMUNITY)
Admission: EM | Admit: 2018-03-21 | Discharge: 2018-03-25 | DRG: 640 | Disposition: A | Discharge status: Skilled Nursing Facility | Payer: Medicare Other | Attending: Internal Medicine | Admitting: Internal Medicine

## 2018-03-21 ENCOUNTER — Encounter (HOSPITAL_COMMUNITY): Payer: Self-pay | Admitting: *Deleted

## 2018-03-21 DIAGNOSIS — R627 Adult failure to thrive: Secondary | ICD-10-CM | POA: Diagnosis present

## 2018-03-21 DIAGNOSIS — S5002XA Contusion of left elbow, initial encounter: Secondary | ICD-10-CM | POA: Diagnosis present

## 2018-03-21 DIAGNOSIS — Z79899 Other long term (current) drug therapy: Secondary | ICD-10-CM

## 2018-03-21 DIAGNOSIS — D630 Anemia in neoplastic disease: Secondary | ICD-10-CM | POA: Diagnosis present

## 2018-03-21 DIAGNOSIS — M1990 Unspecified osteoarthritis, unspecified site: Secondary | ICD-10-CM | POA: Diagnosis present

## 2018-03-21 DIAGNOSIS — H3530 Unspecified macular degeneration: Secondary | ICD-10-CM | POA: Diagnosis present

## 2018-03-21 DIAGNOSIS — Z681 Body mass index (BMI) 19 or less, adult: Secondary | ICD-10-CM

## 2018-03-21 DIAGNOSIS — I7121 Aneurysm of the ascending aorta, without rupture: Secondary | ICD-10-CM | POA: Diagnosis present

## 2018-03-21 DIAGNOSIS — I951 Orthostatic hypotension: Secondary | ICD-10-CM

## 2018-03-21 DIAGNOSIS — M25522 Pain in left elbow: Secondary | ICD-10-CM | POA: Diagnosis present

## 2018-03-21 DIAGNOSIS — Z85818 Personal history of malignant neoplasm of other sites of lip, oral cavity, and pharynx: Secondary | ICD-10-CM

## 2018-03-21 DIAGNOSIS — W1830XA Fall on same level, unspecified, initial encounter: Secondary | ICD-10-CM | POA: Diagnosis present

## 2018-03-21 DIAGNOSIS — N400 Enlarged prostate without lower urinary tract symptoms: Secondary | ICD-10-CM | POA: Diagnosis present

## 2018-03-21 DIAGNOSIS — S3993XA Unspecified injury of pelvis, initial encounter: Secondary | ICD-10-CM | POA: Diagnosis not present

## 2018-03-21 DIAGNOSIS — M25561 Pain in right knee: Secondary | ICD-10-CM | POA: Diagnosis not present

## 2018-03-21 DIAGNOSIS — S299XXA Unspecified injury of thorax, initial encounter: Secondary | ICD-10-CM | POA: Diagnosis not present

## 2018-03-21 DIAGNOSIS — W19XXXA Unspecified fall, initial encounter: Secondary | ICD-10-CM | POA: Diagnosis not present

## 2018-03-21 DIAGNOSIS — S8992XA Unspecified injury of left lower leg, initial encounter: Secondary | ICD-10-CM | POA: Diagnosis not present

## 2018-03-21 DIAGNOSIS — Y9301 Activity, walking, marching and hiking: Secondary | ICD-10-CM | POA: Diagnosis present

## 2018-03-21 DIAGNOSIS — Z923 Personal history of irradiation: Secondary | ICD-10-CM

## 2018-03-21 DIAGNOSIS — Y92009 Unspecified place in unspecified non-institutional (private) residence as the place of occurrence of the external cause: Secondary | ICD-10-CM | POA: Diagnosis not present

## 2018-03-21 DIAGNOSIS — I952 Hypotension due to drugs: Secondary | ICD-10-CM | POA: Diagnosis present

## 2018-03-21 DIAGNOSIS — E876 Hypokalemia: Secondary | ICD-10-CM | POA: Diagnosis present

## 2018-03-21 DIAGNOSIS — S59901A Unspecified injury of right elbow, initial encounter: Secondary | ICD-10-CM | POA: Diagnosis not present

## 2018-03-21 DIAGNOSIS — S8002XA Contusion of left knee, initial encounter: Secondary | ICD-10-CM | POA: Diagnosis present

## 2018-03-21 DIAGNOSIS — Z515 Encounter for palliative care: Secondary | ICD-10-CM

## 2018-03-21 DIAGNOSIS — K219 Gastro-esophageal reflux disease without esophagitis: Secondary | ICD-10-CM | POA: Diagnosis present

## 2018-03-21 DIAGNOSIS — Z8614 Personal history of Methicillin resistant Staphylococcus aureus infection: Secondary | ICD-10-CM

## 2018-03-21 DIAGNOSIS — C062 Malignant neoplasm of retromolar area: Secondary | ICD-10-CM | POA: Diagnosis present

## 2018-03-21 DIAGNOSIS — R531 Weakness: Secondary | ICD-10-CM

## 2018-03-21 DIAGNOSIS — E43 Unspecified severe protein-calorie malnutrition: Secondary | ICD-10-CM | POA: Diagnosis not present

## 2018-03-21 DIAGNOSIS — Z85828 Personal history of other malignant neoplasm of skin: Secondary | ICD-10-CM

## 2018-03-21 DIAGNOSIS — C9110 Chronic lymphocytic leukemia of B-cell type not having achieved remission: Secondary | ICD-10-CM | POA: Diagnosis not present

## 2018-03-21 DIAGNOSIS — S8001XA Contusion of right knee, initial encounter: Secondary | ICD-10-CM | POA: Diagnosis present

## 2018-03-21 DIAGNOSIS — I110 Hypertensive heart disease with heart failure: Secondary | ICD-10-CM | POA: Diagnosis present

## 2018-03-21 DIAGNOSIS — I712 Thoracic aortic aneurysm, without rupture: Secondary | ICD-10-CM | POA: Diagnosis present

## 2018-03-21 DIAGNOSIS — S59902A Unspecified injury of left elbow, initial encounter: Secondary | ICD-10-CM | POA: Diagnosis not present

## 2018-03-21 DIAGNOSIS — E860 Dehydration: Principal | ICD-10-CM

## 2018-03-21 DIAGNOSIS — R102 Pelvic and perineal pain: Secondary | ICD-10-CM | POA: Diagnosis not present

## 2018-03-21 DIAGNOSIS — R638 Other symptoms and signs concerning food and fluid intake: Secondary | ICD-10-CM | POA: Diagnosis present

## 2018-03-21 DIAGNOSIS — I5032 Chronic diastolic (congestive) heart failure: Secondary | ICD-10-CM | POA: Diagnosis not present

## 2018-03-21 DIAGNOSIS — Z801 Family history of malignant neoplasm of trachea, bronchus and lung: Secondary | ICD-10-CM

## 2018-03-21 DIAGNOSIS — M25521 Pain in right elbow: Secondary | ICD-10-CM | POA: Diagnosis not present

## 2018-03-21 DIAGNOSIS — Z8551 Personal history of malignant neoplasm of bladder: Secondary | ICD-10-CM

## 2018-03-21 DIAGNOSIS — M25562 Pain in left knee: Secondary | ICD-10-CM | POA: Diagnosis not present

## 2018-03-21 DIAGNOSIS — Z87442 Personal history of urinary calculi: Secondary | ICD-10-CM

## 2018-03-21 DIAGNOSIS — S5001XA Contusion of right elbow, initial encounter: Secondary | ICD-10-CM | POA: Diagnosis present

## 2018-03-21 DIAGNOSIS — Z23 Encounter for immunization: Secondary | ICD-10-CM

## 2018-03-21 DIAGNOSIS — H409 Unspecified glaucoma: Secondary | ICD-10-CM | POA: Diagnosis present

## 2018-03-21 DIAGNOSIS — I872 Venous insufficiency (chronic) (peripheral): Secondary | ICD-10-CM | POA: Diagnosis present

## 2018-03-21 DIAGNOSIS — R5381 Other malaise: Secondary | ICD-10-CM | POA: Diagnosis not present

## 2018-03-21 DIAGNOSIS — Z66 Do not resuscitate: Secondary | ICD-10-CM | POA: Diagnosis present

## 2018-03-21 DIAGNOSIS — R55 Syncope and collapse: Secondary | ICD-10-CM | POA: Diagnosis present

## 2018-03-21 DIAGNOSIS — R7989 Other specified abnormal findings of blood chemistry: Secondary | ICD-10-CM

## 2018-03-21 DIAGNOSIS — R7303 Prediabetes: Secondary | ICD-10-CM | POA: Diagnosis present

## 2018-03-21 DIAGNOSIS — Z811 Family history of alcohol abuse and dependence: Secondary | ICD-10-CM

## 2018-03-21 DIAGNOSIS — S300XXA Contusion of lower back and pelvis, initial encounter: Secondary | ICD-10-CM | POA: Diagnosis present

## 2018-03-21 DIAGNOSIS — S8991XA Unspecified injury of right lower leg, initial encounter: Secondary | ICD-10-CM | POA: Diagnosis not present

## 2018-03-21 DIAGNOSIS — R296 Repeated falls: Secondary | ICD-10-CM | POA: Diagnosis present

## 2018-03-21 DIAGNOSIS — R740 Nonspecific elevation of levels of transaminase and lactic acid dehydrogenase [LDH]: Secondary | ICD-10-CM | POA: Diagnosis not present

## 2018-03-21 DIAGNOSIS — Z7189 Other specified counseling: Secondary | ICD-10-CM

## 2018-03-21 LAB — CBC WITH DIFFERENTIAL/PLATELET
Basophils Absolute: 0 10*3/uL (ref 0.0–0.1)
Basophils Relative: 0 %
Eosinophils Absolute: 0 10*3/uL (ref 0.0–0.7)
Eosinophils Relative: 1 %
HEMATOCRIT: 36.7 % — AB (ref 39.0–52.0)
HEMOGLOBIN: 13.2 g/dL (ref 13.0–17.0)
LYMPHS PCT: 37 %
Lymphs Abs: 2.5 10*3/uL (ref 0.7–4.0)
MCH: 36.5 pg — ABNORMAL HIGH (ref 26.0–34.0)
MCHC: 36 g/dL (ref 30.0–36.0)
MCV: 101.4 fL — AB (ref 78.0–100.0)
Monocytes Absolute: 0.6 10*3/uL (ref 0.1–1.0)
Monocytes Relative: 8 %
NEUTROS ABS: 3.7 10*3/uL (ref 1.7–7.7)
NEUTROS PCT: 54 %
Platelets: 211 10*3/uL (ref 150–400)
RBC: 3.62 MIL/uL — ABNORMAL LOW (ref 4.22–5.81)
RDW: 13.2 % (ref 11.5–15.5)
WBC: 6.9 10*3/uL (ref 4.0–10.5)

## 2018-03-21 LAB — BLOOD GAS, VENOUS
Acid-Base Excess: 18.5 mmol/L — ABNORMAL HIGH (ref 0.0–2.0)
BICARBONATE: 44.5 mmol/L — AB (ref 20.0–28.0)
O2 Saturation: 27.2 %
PATIENT TEMPERATURE: 98.6
PCO2 VEN: 52.9 mmHg (ref 44.0–60.0)
pH, Ven: 7.535 — ABNORMAL HIGH (ref 7.250–7.430)

## 2018-03-21 LAB — CBG MONITORING, ED: Glucose-Capillary: 98 mg/dL (ref 70–99)

## 2018-03-21 LAB — I-STAT CG4 LACTIC ACID, ED: Lactic Acid, Venous: 3.46 mmol/L (ref 0.5–1.9)

## 2018-03-21 LAB — URINALYSIS, ROUTINE W REFLEX MICROSCOPIC
Bilirubin Urine: NEGATIVE
GLUCOSE, UA: NEGATIVE mg/dL
Hgb urine dipstick: NEGATIVE
Ketones, ur: NEGATIVE mg/dL
LEUKOCYTES UA: NEGATIVE
NITRITE: NEGATIVE
PROTEIN: NEGATIVE mg/dL
Specific Gravity, Urine: 1.008 (ref 1.005–1.030)
pH: 8 (ref 5.0–8.0)

## 2018-03-21 LAB — COMPREHENSIVE METABOLIC PANEL
ALBUMIN: 3.3 g/dL — AB (ref 3.5–5.0)
ALT: 11 U/L (ref 0–44)
ANION GAP: 20 — AB (ref 5–15)
AST: 31 U/L (ref 15–41)
Alkaline Phosphatase: 84 U/L (ref 38–126)
BUN: 19 mg/dL (ref 8–23)
CO2: 39 mmol/L — AB (ref 22–32)
Calcium: 9.6 mg/dL (ref 8.9–10.3)
Chloride: 74 mmol/L — ABNORMAL LOW (ref 98–111)
Creatinine, Ser: 0.83 mg/dL (ref 0.61–1.24)
GFR calc non Af Amer: >60 mL/min (ref >60)
GLUCOSE: 109 mg/dL — AB (ref 70–99)
POTASSIUM: 3.1 mmol/L — AB (ref 3.5–5.1)
SODIUM: 133 mmol/L — AB (ref 135–145)
TOTAL PROTEIN: 6.7 g/dL (ref 6.5–8.1)
Total Bilirubin: 1.4 mg/dL — ABNORMAL HIGH (ref 0.3–1.2)

## 2018-03-21 LAB — CG4 I-STAT (LACTIC ACID): Lactic Acid, Venous: 2.61 mmol/L (ref 0.5–1.9)

## 2018-03-21 LAB — ETHANOL: Alcohol, Ethyl (B): <10 mg/dL (ref <10)

## 2018-03-21 MED ORDER — BRIMONIDINE TARTRATE 0.2 % OP SOLN
1.0000 [drp] | Freq: Two times a day (BID) | OPHTHALMIC | Status: DC
  Administered 2018-03-21 – 2018-03-25 (×8): 1 [drp] via OPHTHALMIC
  Filled 2018-03-21: qty 5

## 2018-03-21 MED ORDER — ORAL CARE MOUTH RINSE
15.0000 mL | Freq: Two times a day (BID) | OROMUCOSAL | Status: DC
  Administered 2018-03-22 – 2018-03-25 (×7): 15 mL via OROMUCOSAL

## 2018-03-21 MED ORDER — SODIUM CHLORIDE 0.9 % IV SOLN
INTRAVENOUS | Status: AC
  Administered 2018-03-21 – 2018-03-22 (×3): via INTRAVENOUS

## 2018-03-21 MED ORDER — ENOXAPARIN SODIUM 40 MG/0.4ML ~~LOC~~ SOLN
40.0000 mg | SUBCUTANEOUS | Status: DC
  Administered 2018-03-21 – 2018-03-24 (×4): 40 mg via SUBCUTANEOUS
  Filled 2018-03-21 (×4): qty 0.4

## 2018-03-21 MED ORDER — ONDANSETRON HCL 4 MG PO TABS: 4.0000 mg | ORAL_TABLET | Freq: Four times a day (QID) | ORAL | Status: DC | PRN

## 2018-03-21 MED ORDER — NETARSUDIL DIMESYLATE 0.02 % OP SOLN
1.0000 [drp] | Freq: Every evening | OPHTHALMIC | Status: DC
  Administered 2018-03-24 – 2018-03-25 (×2): 1 [drp] via OPHTHALMIC

## 2018-03-21 MED ORDER — ONDANSETRON HCL 4 MG/2ML IJ SOLN: 4.0000 mg | Freq: Four times a day (QID) | INTRAMUSCULAR | Status: DC | PRN

## 2018-03-21 MED ORDER — POTASSIUM CHLORIDE 20 MEQ/15ML (10%) PO SOLN
40.0000 meq | Freq: Once | ORAL | Status: AC
  Administered 2018-03-21: 40 meq via ORAL
  Filled 2018-03-21: qty 30

## 2018-03-21 MED ORDER — SODIUM CHLORIDE 0.9 % IV BOLUS
1000.0000 mL | Freq: Once | INTRAVENOUS | Status: AC
  Administered 2018-03-21: 1000 mL via INTRAVENOUS

## 2018-03-21 MED ORDER — POTASSIUM CHLORIDE CRYS ER 20 MEQ PO TBCR: 40.0000 meq | EXTENDED_RELEASE_TABLET | Freq: Once | ORAL | Status: DC

## 2018-03-21 MED ORDER — BRIMONIDINE TARTRATE-TIMOLOL 0.2-0.5 % OP SOLN: 1.0000 [drp] | Freq: Two times a day (BID) | OPHTHALMIC | Status: DC

## 2018-03-21 MED ORDER — INFLUENZA VAC SPLIT HIGH-DOSE 0.5 ML IM SUSY
0.5000 mL | PREFILLED_SYRINGE | INTRAMUSCULAR | Status: AC
  Administered 2018-03-22: 0.5 mL via INTRAMUSCULAR
  Filled 2018-03-21: qty 0.5

## 2018-03-21 MED ORDER — MIDODRINE HCL 5 MG PO TABS
10.0000 mg | ORAL_TABLET | Freq: Three times a day (TID) | ORAL | Status: DC
  Administered 2018-03-21 – 2018-03-25 (×12): 10 mg via ORAL
  Filled 2018-03-21 (×13): qty 2

## 2018-03-21 MED ORDER — TIMOLOL MALEATE 0.5 % OP SOLN
1.0000 [drp] | Freq: Two times a day (BID) | OPHTHALMIC | Status: DC
  Administered 2018-03-21 – 2018-03-25 (×8): 1 [drp] via OPHTHALMIC
  Filled 2018-03-21: qty 5

## 2018-03-21 MED ORDER — POLYETHYLENE GLYCOL 3350 17 G PO PACK
17.0000 g | PACK | Freq: Every day | ORAL | Status: DC | PRN
  Administered 2018-03-23: 17 g via ORAL
  Filled 2018-03-21: qty 1

## 2018-03-21 MED ORDER — LATANOPROST 0.005 % OP SOLN
1.0000 [drp] | Freq: Every evening | OPHTHALMIC | Status: DC
  Administered 2018-03-21 – 2018-03-25 (×5): 1 [drp] via OPHTHALMIC
  Filled 2018-03-21: qty 2.5

## 2018-03-21 MED ORDER — SODIUM CHLORIDE 0.9 % IV SOLN
Freq: Once | INTRAVENOUS | Status: AC
  Administered 2018-03-21: 12:00:00 via INTRAVENOUS

## 2018-03-21 MED ORDER — IBUPROFEN 200 MG PO TABS: 600.0000 mg | ORAL_TABLET | Freq: Four times a day (QID) | ORAL | Status: DC | PRN

## 2018-03-21 MED ORDER — ACETAMINOPHEN 325 MG PO TABS: 650.0000 mg | ORAL_TABLET | Freq: Four times a day (QID) | ORAL | Status: DC | PRN

## 2018-03-21 MED ORDER — ACETAMINOPHEN 650 MG RE SUPP: 650.0000 mg | Freq: Four times a day (QID) | RECTAL | Status: DC | PRN

## 2018-03-21 NOTE — ED Triage Notes (Addendum)
EMS reports pt fell at home, abrasions to left arm, pain in left arm. ? Concern about pt having UTI. No LOC noted with fall. Pt has some type of growth at base of left index finger, pt reports concern and pain.

## 2018-03-21 NOTE — ED Notes (Signed)
TWO GOLD & BLUE SAVE TUBE IN MAIN LAB

## 2018-03-21 NOTE — Progress Notes (Signed)
Pt daughter came to floor to visit and let RN and staff know that patient's significant other has a history of bringing alcohol in to him in the hospital. Daughter advised [place] that patient is an alcoholic. So we are monitoring when she is here visiting. His daughter also brought a copy of his health care power of attorney which was placed in patient's chart. Daughter has requested that if/when patient is d/c to go home, she would like to be the one to take him home and does not want him to be going home with significant other at this time. Updated night shift RN.

## 2018-03-21 NOTE — ED Notes (Signed)
Timothy Kim is obtaining added on labs after this RN was unable to obtain with IV stick

## 2018-03-21 NOTE — ED Notes (Signed)
ED TO INPATIENT HANDOFF REPORT  Name/Age/Gender Timothy Kim 84 y.o. male  Code Status Code Status History    Date Active Date Inactive Code Status Order ID Comments User Context   03/19/2016 0022 03/22/2016 1831 Full Code [id number]  Rise Patience, MD Inpatient      Home/SNF/Other Home  Chief Complaint fall, abrasions to arm, UTI  Level of Care/Admitting Diagnosis ED Disposition    ED Disposition Condition [facility] Area: [facility] [[id number]]  Level of Care: Telemetry [5]  Admit to tele based on following criteria: Eval of Syncope  Diagnosis: Fall [290176]  Admitting Physician: Cristy Folks [[id number]]  Attending Physician: Cristy Folks [[id number]]  PT Class (Do Not Modify): Observation [104]  PT Acc Code (Do Not Modify): Observation [10022]       Medical History Past Medical History:  Diagnosis Date  . Alcohol abuse   . Allergic rhinitis   . Anemia in neoplastic disease    chronic  . Bilateral lower extremity edema    feet  . Borderline diabetes   . BPH (benign prostatic hypertrophy)   . CLL (chronic lymphocytic leukemia) [facility]) oncologist-  dr Heath Lark ([facility])   dx Jan 2014 --- Stage 1--  currently asymptomatic as of Apr 2016 and No active disease  . DDD (degenerative disc disease), cervical   . DDD (degenerative disc disease), lumbosacral   . 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 Dysphasia functional--  speech therapy   post op  radical neck dissection 07-21-2014--  changed diet to no chewy food , softer foods , states with the changes swallowed okay without any issues  . Glaucoma    both eyes  . History of gastroesophageal reflux (GERD)   . History of kidney stones    right side--  per CT from [facility]  01-17-2015 non-obstructing  . History of radiation therapy    09-04-2014 to 10-13-2014  Right retromolar region (tumor bed) and right neck/  60Gy in 30 fractions to tumor bed and 54Gy in 30 fractions to neck  . History of radiation to head and neck region 09-04-2014 to  10-13-2014   right retromolar region and right neck  60 Gy  . History of syncope    a. Event monitor 11/17: No evidence of significant AVB or > 3 second pause.; plan ILR if recurrent syncope in the future  . History of tracheostomy    post op radical neck dissection  07-21-2014--  post op tracheostomy on 07-25-2014  decannulated and sutured 07-29-2014  . Hypertension   . Macular degeneration    right eye only  . MRSA (methicillin resistant staph aureus) culture positive   . OA (osteoarthritis)   . Organic impotence   . Pneumonia 12/2015  . Pulmonary nodule    benign  and stable per last CT  . Radiation-induced dermatitis   . Squamous cell carcinoma of retromolar [facility]) oncologist-  dr Isidore Moos   Stage IVB, pT1b, pN0, Grade 2, +PNI, no LVSI---  S/P  RADICAL NECK DISSECTION AND RADIATION  . Thoracic ascending aortic aneurysm ([place]) 03/28/2016   Chest CTA 9/17: 4.6 cm fusiform ascending thoracic aortic aneurysm // Chest CTA 4/18: Ascending thoracic aortic aneurysm 4.2 cm >> repeat 1 year  . 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 Transitional cell carcinoma of bladder [facility]) urologist-- dr Junious Silk   High - grade  . Venous insufficiency     Allergies No Known Allergies  IV Location/Drains/Wounds Patient Lines/Drains/Airways Status   Active Line/Drains/Airways    Name:   Placement date:   Placement time:   Site:   Days:   Peripheral IV 03/21/18 Right Forearm   03/21/18    0916    Forearm   less than 1          Labs/Imaging Results for orders placed or performed during the hospital encounter of 03/21/18 (from the past 48 hour(s))  Comprehensive metabolic panel      Status: Abnormal   Collection Time: 03/21/18  8:51 AM  Result Value Ref Range   Sodium 133 (L) 135 - 145 mmol/L   Potassium 3.1 (L) 3.5 - 5.1 mmol/L   Chloride 74 (L) 98 - 111 mmol/L   CO2 39 (H) 22 - 32 mmol/L   Glucose, Bld 109 (H) 70 - 99 mg/dL   BUN 19 8 - 23 mg/dL   Creatinine, Ser 0.83 0.61 - 1.24 mg/dL   Calcium 9.6 8.9 - 10.3 mg/dL   Total Protein 6.7 6.5 - 8.1 g/dL   Albumin 3.3 (L) 3.5 - 5.0 g/dL   AST 31 15 - 41 U/L   ALT 11 0 - 44 U/L   Alkaline Phosphatase 84 38 - 126 U/L   Total Bilirubin 1.4 (H) 0.3 - 1.2 mg/dL   GFR calc non Af Amer >60 >60 mL/min   GFR calc Af Amer >60 >60 mL/min    Comment: (NOTE) The eGFR has been calculated using the CKD EPI equation. This calculation has not been validated in all clinical situations. eGFR's persistently <60 mL/min signify possible Chronic Kidney Disease.    Anion gap 20 (H) 5 - 15    Comment: Performed at [facility], [place] [address], [place], [place] [address]  CBG monitoring, ED     Status: None   Collection Time: 03/21/18  9:08 AM  Result Value Ref Range   Glucose-Capillary 98 70 - 99 mg/dL  Urinalysis, Routine w reflex microscopic     Status: None   Collection Time: 03/21/18  9:20 AM  Result Value Ref Range   Color, Urine YELLOW YELLOW   APPearance CLEAR CLEAR   Specific Gravity, Urine 1.008 1.005 - 1.030   pH 8.0 5.0 - 8.0   Glucose, UA NEGATIVE NEGATIVE mg/dL   Hgb urine dipstick NEGATIVE NEGATIVE   Bilirubin Urine NEGATIVE NEGATIVE   Ketones, ur NEGATIVE NEGATIVE mg/dL   Protein, ur NEGATIVE NEGATIVE mg/dL   Nitrite NEGATIVE NEGATIVE   Leukocytes, UA NEGATIVE NEGATIVE    Comment: Performed at [facility] [address], [place], [place] [address]  CBC with Differential     Status: Abnormal   Collection Time: 03/21/18 10:18 AM  Result Value Ref Range   WBC 6.9 4.0 - 10.5 K/uL   RBC 3.62 (L) 4.22 - 5.81 MIL/uL   Hemoglobin 13.2 13.0 - 17.0 g/dL   HCT 36.7 (L) 39.0 -  52.0 %   MCV 101.4 (H) 78.0 - 100.0 fL   MCH 36.5 (H) 26.0 - 34.0 pg   MCHC 36.0 30.0 - 36.0 g/dL   RDW 13.2 11.5 - 15.5 %   Platelets 211 150 - 400 K/uL   Neutrophils Relative % 54 %   Neutro Abs 3.7  1.7 - 7.7 K/uL   Lymphocytes Relative 37 %   Lymphs Abs 2.5 0.7 - 4.0 K/uL   Monocytes Relative 8 %   Monocytes Absolute 0.6 0.1 - 1.0 K/uL   Eosinophils Relative 1 %   Eosinophils Absolute 0.0 0.0 - 0.7 K/uL   Basophils Relative 0 %   Basophils Absolute 0.0 0.0 - 0.1 K/uL    Comment: Performed at [facility], [place] [address], [place], [place] [address]  Ethanol     Status: None   Collection Time: 03/21/18 10:18 AM  Result Value Ref Range   Alcohol, Ethyl (B) <10 <10 mg/dL    Comment: (NOTE) Lowest detectable limit for serum alcohol is 10 mg/dL. For medical purposes only. Performed at [facility], [place] [address], [place], [place] [address]   Blood gas, venous     Status: Abnormal   Collection Time: 03/21/18 10:19 AM  Result Value Ref Range   pH, Ven 7.535 (H) 7.250 - 7.430   pCO2, Ven 52.9 44.0 - 60.0 mmHg   pO2, Ven  32.0 - 45.0 mmHg    CRITICAL RESULT CALLED TO, READ BACK BY AND VERIFIED WITH:    Comment: adam curatolo pa by angie dunlap rr rcp on 1018 on 03/21/18   Bicarbonate 44.5 (H) 20.0 - 28.0 mmol/L   Acid-Base Excess 18.5 (H) 0.0 - 2.0 mmol/L   O2 Saturation 27.2 %   Patient temperature 98.6    Collection site VEIN    Drawn by COLLECTED BY LABORATORY    Sample type VENOUS     Comment: Performed at [facility], [place] [address] [place], [place] [address]  I-Stat CG4 Lactic Acid, ED     Status: Abnormal   Collection Time: 03/21/18 10:25 AM  Result Value Ref Range   Lactic Acid, Venous 3.46 (HH) 0.5 - 1.9 mmol/L   Comment NOTIFIED PHYSICIAN    Dg Chest 2 View  Result Date: 03/21/2018 CLINICAL DATA:  Status post fall. EXAM: CHEST - 2 VIEW COMPARISON:  CT chest 09/26/2016, 01/04/2015 FINDINGS: There is no  focal consolidation. There is a 14 mm right lower lobe pulmonary nodule smaller compared with prior CT chest dated 01/04/2015. There is no pleural effusion or pneumothorax. The heart and mediastinal contours are unremarkable. The osseous structures are unremarkable. IMPRESSION: No active cardiopulmonary disease. 14 mm right lower lobe pulmonary nodule smaller compared with prior CT chest dated 01/04/2015. Electronically Signed   By: Kathreen Devoid   On: 03/21/2018 12:47   Dg Pelvis 1-2 Views  Result Date: 03/21/2018 CLINICAL DATA:  Pain after fall EXAM: PELVIS - 1-2 VIEW COMPARISON:  None. FINDINGS: The right hip is internally rotated limiting evaluation. No obvious fracture in the right hip. Left hip appears grossly intact. The pelvic bones are without obvious fracture. IMPRESSION: No identified fractures. Mildly limited evaluation of the right hip due to internal rotation. Electronically Signed   By: Dorise Bullion III M.D   On: 03/21/2018 11:15   Dg Elbow Complete Left  Result Date: 03/21/2018 CLINICAL DATA:  Status post fall.  Left elbow pain. EXAM: LEFT ELBOW - COMPLETE 3+ VIEW COMPARISON:  None. FINDINGS: There is no evidence of fracture, dislocation, or joint effusion. There is no evidence of arthropathy or other focal bone abnormality. There is generalized osteopenia. There are mild enthesopathic changes at the common flexor tendon origin. Soft tissues are unremarkable. IMPRESSION: No acute osseous injury of the left elbow. Electronically Signed   By: Elbert Ewings  Patel   On: 03/21/2018 11:12   Dg Elbow Complete Right  Result Date: 03/21/2018 CLINICAL DATA:  Status post fall. EXAM: RIGHT ELBOW - COMPLETE 3+ VIEW COMPARISON:  None. FINDINGS: There is no evidence of fracture, dislocation, or joint effusion. There is no evidence of arthropathy or other focal bone abnormality. There is generalized osteopenia mild enthesopathic changes at the common flexor tendon origin. Soft tissues are unremarkable.  IMPRESSION: No acute osseous injury of the right elbow. Electronically Signed   By: Kathreen Devoid   On: 03/21/2018 11:11   Dg Knee Complete 4 Views Left  Result Date: 03/21/2018 CLINICAL DATA:  Pain following fall EXAM: LEFT KNEE - COMPLETE 4+ VIEW COMPARISON:  March 18, 2016 FINDINGS: Frontal, lateral, and bilateral oblique views were obtained. There is a bipartite patella, an anatomic variant. No evident fracture or dislocation. No joint effusion. Joint spaces appear unremarkable. There is slight chondrocalcinosis. No erosive change. IMPRESSION: Slight chondrocalcinosis, a finding that may be seen with osteoarthritis or calcium pyrophosphate deposition disease. No appreciable joint space narrowing or erosion. No fracture or joint effusion. Bipartite patella noted, an anatomic variant. Electronically Signed   By: Lowella Grip III M.D.   On: 03/21/2018 11:13   Dg Knee Complete 4 Views Right  Result Date: 03/21/2018 CLINICAL DATA:  Pain following fall EXAM: RIGHT KNEE - COMPLETE 4+ VIEW COMPARISON:  Right knee radiographs and right knee CT March 18, 2018 FINDINGS: Frontal, lateral, and bilateral oblique views were obtained. There is an apparent bipartite patella, stable. No acute fracture or dislocation. No joint effusion. There is slight narrowing medially and in the patellofemoral joint regions. There is chondrocalcinosis. No erosive change. IMPRESSION: No acute fracture or dislocation. No joint effusion. Bipartite patella, an anatomic variant. There is joint space narrowing medially and in the patellofemoral joint regions, similar to prior study. Chondrocalcinosis present, a finding that may be due to osteoarthritis or calcium pyrophosphate deposition disease. Electronically Signed   By: Lowella Grip III M.D.   On: 03/21/2018 11:15    Pending Labs [employer] (From admission, onward)    Start     Ordered   Signed and Occupational hygienist morning,   R     Signed  and Held   Signed and Held  CBC  Tomorrow morning,   R     Signed and Held          Vitals/Pain Today's Vitals   03/21/18 0848 03/21/18 1216  BP: 115/78 135/85  Pulse: 70 73  Resp: 18 12  Temp: 97.6 F (36.4 C)   TempSrc: Oral   SpO2: 99% 96%    Isolation Precautions No active isolations  Medications Medications  sodium chloride 0.9 % bolus 1,000 mL (0 mLs Intravenous Stopped 03/21/18 1058)  0.9 %  sodium chloride infusion ( Intravenous New Bag/Given 03/21/18 1203)  potassium chloride 20 MEQ/15ML (10%) solution 40 mEq (40 mEq Oral Given 03/21/18 1057)    Mobility walks with person assist

## 2018-03-21 NOTE — H&P (Signed)
History and Physical    Timothy Kim JHE:[id number] DOB: 06/27/1929 DOA: 03/21/2018  PCP: Marletta Lor, MD   Patient coming from: Home   Chief Complaint: Fall  HPI: Timothy Kim is a 84 y.o. 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  Patient reports that he got up from bed and went to use the restroom with his walker and then abruptly fell.  He is not sure if he had a syncopal episode or mechanical fall as he is a history of both however he reports he feels like it was most likely a mechanical fall.  He fell and bruised his bilateral knees and his bilateral elbows.  He never hit his head or had complete syncope.  He denies any chest pain, palpitations, orthopnea, lower extremity edema, shortness of breath.  He reports otherwise being in fairly good health without any nausea, vomiting, abdominal pain, chest pain.  He does report that he has chronically poor p.o. due to his significant head and neck surgery and radiation.  He apparently has a history of syncope approximately 2 years ago with a plan to consider implantation of a loop recorder.  He is also noted to have a history of hypertension that is now progressed to chronic hypotension and significant orthostasis with this requiring midodrine.  He reports that he does attempt to use compensatory mechanisms when changing positions including the walker and standing for several minutes.  ED Course: In the ED patient's vitals were stable.  Labs are notable for mild hypokalemia 3.1, lactate of 3.46, sodium 133, white blood cell count of 6.9, normal hemoglobin and hematocrit.  X-ray of bilateral elbows knees and pelvis showed no acute fracture but did show contusions.  Patient was given  IV fluids and potassium supplementation and called for admission due to elevated lactate and concern for dehydration.  Review of Systems: As per HPI otherwise 10 point review of systems negative.    Past Medical History:  Diagnosis Date  . Alcohol abuse   . Allergic rhinitis   . Anemia in neoplastic disease    chronic  . Bilateral lower extremity edema    feet  . Borderline diabetes   . BPH (benign prostatic hypertrophy)   . CLL (chronic lymphocytic leukemia) [facility]) oncologist-  dr Heath Lark ([facility])   dx Jan 2014 --- Stage 1--  currently asymptomatic as of Apr 2016 and No active disease  . DDD (degenerative disc disease), cervical   . DDD (degenerative disc disease), lumbosacral   . 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 Dysphasia functional--  speech therapy   post op  radical neck dissection 07-21-2014--  changed diet to no chewy food , softer foods , states with the changes swallowed okay without any issues  . Glaucoma    both eyes  . History of gastroesophageal reflux (GERD)   . History of kidney stones    right side--  per CT from [facility] 01-17-2015 non-obstructing  . History of radiation therapy    09-04-2014 to 10-13-2014  Right retromolar region (tumor bed) and right neck/  60Gy in 30 fractions  to tumor bed and 54Gy in 30 fractions to neck  . History of radiation to head and neck region 09-04-2014 to  10-13-2014   right retromolar region and right neck  60 Gy  . History of syncope    a. Event monitor 11/17: No evidence of significant AVB or > 3 second pause.; plan ILR if recurrent syncope in the future  . History of tracheostomy    post op radical neck dissection  07-21-2014--  post op tracheostomy on 07-25-2014  decannulated and sutured 07-29-2014  . Hypertension   .  Macular degeneration    right eye only  . MRSA (methicillin resistant staph aureus) culture positive   . OA (osteoarthritis)   . Organic impotence   . Pneumonia 12/2015  . Pulmonary nodule    benign  and stable per last CT  . Radiation-induced dermatitis   . Squamous cell carcinoma of retromolar trigone [facility]) oncologist-  dr Isidore Moos   Stage IVB, pT1b, pN0, Grade 2, +PNI, no LVSI---  S/P  RADICAL NECK DISSECTION AND RADIATION  . Thoracic ascending aortic aneurysm ([place]) 03/28/2016   Chest CTA 9/17: 4.6 cm fusiform ascending thoracic aortic aneurysm // Chest CTA 4/18: Ascending thoracic aortic aneurysm 4.2 cm >> repeat 1 year  . 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 Transitional cell carcinoma of bladder [facility]) urologist-- dr Junious Silk   High - grade  . Venous insufficiency     Past Surgical History:  Procedure Laterality Date  . CATARACT EXTRACTION W/ INTRAOCULAR LENS  IMPLANT, BILATERAL    . COLONOSCOPY N/A 07/23/2016   Procedure: COLONOSCOPY;  Surgeon: Wilford Corner, MD;  Location: [facility] ENDOSCOPY;  Service: Endoscopy;  Laterality: N/A;  . CYSTOSCOPY N/A 02/02/2015   Procedure: CYSTOSCOPY, INSTILLATION OF MITOMYCIN C;  Surgeon: Lowella Bandy, MD;  Location: [facility];  Service: Urology;  Laterality: N/A;  . CYSTOSCOPY WITH BIOPSY N/A 03/27/2015   Procedure: CYSTOSCOPY WITH BLADDER  BIOPSY WITH FULGERATION;  Surgeon: Festus Aloe, MD;  Location: [facility];  Service: Urology;  Laterality: N/A;  . ESOPHAGOGASTRODUODENOSCOPY N/A 07/23/2016   Procedure: ESOPHAGOGASTRODUODENOSCOPY (EGD);  Surgeon: Wilford Corner, MD;  Location: [facility] END[facility];  Service: Endoscopy;  Laterality: N/A;  . INGUINAL HERNIA REPAIR Bilateral right 1988//  left 1970's  . LAMINECTOMY WITH POSTERIOR LATERAL ARTHRODESIS LEVEL 1  09-01-2003   L3 -5 LAMINECTOMY W/ DECOMPRESSION AND FUSION L4-5  . MAXILLECTOMY Right 07/21/14,   [facility]   Radical Neck  Dissection, Maxillectomy, Right Marginal Mandibulectomy, dental extractions, Parascauplar fasciocutaeous Free flap reconstruction  . RETINAL DETACHMENT REPAIR W/ SCLERAL BUCKLE LE  11-03-2000  . ROTATOR CUFF REPAIR Right 12-11-2000  . tonsil biopsy Right 05/24/14  . TRANSURETHRAL RESECTION OF BLADDER TUMOR N/A 02/02/2015   Procedure: TRANSURETHRAL RESECTION OF BLADDER TUMOR (TURBT);  Surgeon: Lowella Bandy, MD;  Location[facility];  Service: Urology;  Laterality: N/A;     reports that he has never smoked. He has never used smokeless tobacco. He reports that he drinks alcohol. He reports that he does not use drugs.  No Known Allergies  Family History  Problem Relation Age of Onset  . Cancer Mother        lung ca  . Alcohol abuse Sister   . Hypertension Neg Hx        family hx  . Sudden death Neg Hx        famiylhx  . Heart attack Neg  Hx     Prior to Admission medications   Medication Sig Start Date End Date Taking? Authorizing Provider  brimonidine-timolol (COMBIGAN) 0.2-0.5 % ophthalmic solution Place 1 drop into both eyes 2 (two) times daily.    Yes [provider]  furosemide (LASIX) 20 MG tablet Take 20 mg by mouth daily. 02/09/15  Yes [provider]  latanoprost (XALATAN) 0.005 % ophthalmic solution Place 1 drop into both eyes every evening.  06/01/14  Yes [provider]  midodrine (PROAMATINE) 10 MG tablet TAKE 1 TABLET 3 TIMES A DAY WITH MEALS 03/08/18  Yes Marletta Lor, MD  RHOPRESSA 0.02 % SOLN Place 1 drop into the right eye daily. 09/28/17  Yes [provider]  feeding supplement, ENSURE ENLIVE, (ENSURE ENLIVE) LIQD Take 237 mLs by mouth 2 (two) times daily between meals. Patient not taking: Reported on 03/21/2018 03/22/16   Hosie Poisson, MD  midodrine (PROAMATINE) 2.5 MG tablet TAKE 1 TABLET BY MOUTH 3 TIMES DAILY WITH MEALS. Patient not taking: Reported on 03/21/2018 02/11/18   Marletta Lor, MD  sodium fluoride  (DENTA 5000 PLUS) 1.1 % CREA dental cream Apply a thin ribbon of gel to tooth brush. Brush teeth for 2 minutes. Spit out excess. Repeat nightly. Patient not taking: Reported on 11/01/2017 07/03/15   Lenn Cal, DDS  triamcinolone cream (KENALOG) 0.1 % Apply topically 2 (two) times daily. Patient not taking: Reported on 03/21/2018 02/26/17   Marletta Lor, MD    Physical Exam: Vitals:   03/21/18 0848  BP: 115/78  Pulse: 70  Resp: 18  Temp: 97.6 F (36.4 C)  TempSrc: Oral  SpO2: 99%    Constitutional: NAD, calm, comfortable Vitals:   03/21/18 0848  BP: 115/78  Pulse: 70  Resp: 18  Temp: 97.6 F (36.4 C)  TempSrc: Oral  SpO2: 99%   Eyes: Anicteric sclera ENMT: Dry mucous membranes, poor dentition.  Neck: Irregularly-shaped neck, noted to have well-healed incisions, chronic radiation changes Respiratory: clear to auscultation bilaterally, no wheezing, no crackles. Normal respiratory effort. No accessory muscle use.  Cardiovascular: Regular rate and rhythm, no murmurs Abdomen: no tenderness, no masses palpated. No hepatosplenomegaly. Bowel sounds positive.  Musculoskeletal: No lower extremity edema Skin: Chronic dermatitis of bilateral lower extremities Neurologic: CN 2-12 grossly intact. Sensation intact, DTR normal. Strength 5/5 in all 4.  Psychiatric: Normal judgment and insight. Alert and oriented x 3. Normal mood.     Labs on Admission: I have personally reviewed following labs and imaging studies  CBC: Recent Labs  Lab 03/21/18 1018  WBC 6.9  NEUTROABS 3.7  HGB 13.2  HCT 36.7*  MCV 101.4*  PLT 161   Basic Metabolic Panel: Recent Labs  Lab 03/21/18 0851  NA 133*  K 3.1*  CL 74*  CO2 39*  GLUCOSE 109*  BUN 19  CREATININE 0.83  CALCIUM 9.6   GFR: CrCl cannot be calculated (Unknown ideal weight.). Liver Function Tests: Recent Labs  Lab 03/21/18 0851  AST 31  ALT 11  ALKPHOS 84  BILITOT 1.4*  PROT 6.7  ALBUMIN 3.3*   No results  for input(s): LIPASE, AMYLASE in the last 168 hours. No results for input(s): AMMONIA in the last 168 hours. Coagulation Profile: No results for input(s): INR, PROTIME in the last 168 hours. Cardiac Enzymes: No results for input(s): CKTOTAL, CKMB, CKMBINDEX, TROPONINI in the last 168 hours. BNP (last 3 results) No results for input(s): PROBNP in the last 8760 hours. HbA1C: No results for  input(s): HGBA1C in the last 72 hours. CBG: Recent Labs  Lab 03/21/18 0908  GLUCAP 98   Lipid Profile: No results for input(s): CHOL, HDL, LDLCALC, TRIG, CHOLHDL, LDLDIRECT in the last 72 hours. Thyroid Function Tests: No results for input(s): TSH, T4TOTAL, FREET4, T3FREE, THYROIDAB in the last 72 hours. Anemia Panel: No results for input(s): VITAMINB12, FOLATE, FERRITIN, TIBC, IRON, RETICCTPCT in the last 72 hours. Urine analysis:    Component Value Date/Time   COLORURINE YELLOW 03/21/2018 0920   APPEARANCEUR CLEAR 03/21/2018 0920   LABSPEC 1.008 03/21/2018 0920   LABSPEC 1.010 06/07/2014 1535   PHURINE 8.0 03/21/2018 0920   GLUCOSEU NEGATIVE 03/21/2018 0920   GLUCOSEU Negative 06/07/2014 1535   HGBUR NEGATIVE 03/21/2018 0920   BILIRUBINUR NEGATIVE 03/21/2018 0920   BILIRUBINUR Negative 06/07/2014 1535   KETONESUR NEGATIVE 03/21/2018 0920   PROTEINUR NEGATIVE 03/21/2018 0920   UROBILINOGEN 0.2 06/07/2014 1535   NITRITE NEGATIVE 03/21/2018 0920   LEUKOCYTESUR NEGATIVE 03/21/2018 0920   LEUKOCYTESUR Trace 06/07/2014 1535    Radiological Exams on Admission: Dg Pelvis 1-2 Views  Result Date: 03/21/2018 CLINICAL DATA:  Pain after fall EXAM: PELVIS - 1-2 VIEW COMPARISON:  None. FINDINGS: The right hip is internally rotated limiting evaluation. No obvious fracture in the right hip. Left hip appears grossly intact. The pelvic bones are without obvious fracture. IMPRESSION: No identified fractures. Mildly limited evaluation of the right hip due to internal rotation. Electronically Signed   By:  Dorise Bullion III M.D   On: 03/21/2018 11:15   Dg Elbow Complete Left  Result Date: 03/21/2018 CLINICAL DATA:  Status post fall.  Left elbow pain. EXAM: LEFT ELBOW - COMPLETE 3+ VIEW COMPARISON:  None. FINDINGS: There is no evidence of fracture, dislocation, or joint effusion. There is no evidence of arthropathy or other focal bone abnormality. There is generalized osteopenia. There are mild enthesopathic changes at the common flexor tendon origin. Soft tissues are unremarkable. IMPRESSION: No acute osseous injury of the left elbow. Electronically Signed   By: Kathreen Devoid   On: 03/21/2018 11:12   Dg Elbow Complete Right  Result Date: 03/21/2018 CLINICAL DATA:  Status post fall. EXAM: RIGHT ELBOW - COMPLETE 3+ VIEW COMPARISON:  None. FINDINGS: There is no evidence of fracture, dislocation, or joint effusion. There is no evidence of arthropathy or other focal bone abnormality. There is generalized osteopenia mild enthesopathic changes at the common flexor tendon origin. Soft tissues are unremarkable. IMPRESSION: No acute osseous injury of the right elbow. Electronically Signed   By: Kathreen Devoid   On: 03/21/2018 11:11   Dg Knee Complete 4 Views Left  Result Date: 03/21/2018 CLINICAL DATA:  Pain following fall EXAM: LEFT KNEE - COMPLETE 4+ VIEW COMPARISON:  March 18, 2016 FINDINGS: Frontal, lateral, and bilateral oblique views were obtained. There is a bipartite patella, an anatomic variant. No evident fracture or dislocation. No joint effusion. Joint spaces appear unremarkable. There is slight chondrocalcinosis. No erosive change. IMPRESSION: Slight chondrocalcinosis, a finding that may be seen with osteoarthritis or calcium pyrophosphate deposition disease. No appreciable joint space narrowing or erosion. No fracture or joint effusion. Bipartite patella noted, an anatomic variant. Electronically Signed   By: Lowella Grip III M.D.   On: 03/21/2018 11:13   Dg Knee Complete 4 Views  Right  Result Date: 03/21/2018 CLINICAL DATA:  Pain following fall EXAM: RIGHT KNEE - COMPLETE 4+ VIEW COMPARISON:  Right knee radiographs and right knee CT March 18, 2018 FINDINGS: Frontal, lateral, and bilateral  oblique views were obtained. There is an apparent bipartite patella, stable. No acute fracture or dislocation. No joint effusion. There is slight narrowing medially and in the patellofemoral joint regions. There is chondrocalcinosis. No erosive change. IMPRESSION: No acute fracture or dislocation. No joint effusion. Bipartite patella, an anatomic variant. There is joint space narrowing medially and in the patellofemoral joint regions, similar to prior study. Chondrocalcinosis present, a finding that may be due to osteoarthritis or calcium pyrophosphate deposition disease. Electronically Signed   By: Lowella Grip III M.D.   On: 03/21/2018 11:15    EKG: Independently reviewed.  Sinus rhythm, no acute ST segment changes, unchanged from prior  Assessment/Plan Active Problems:   BPH (benign prostatic hyperplasia)   Osteoarthritis   CLL (chronic lymphocytic leukemia) (HCC)   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)   Weakness generalized   Thoracic ascending aortic aneurysm ([facility])   Drug-induced hypotension   Fall at home, initial encounter   Dehydration    #) Fall/possible syncope: At this time it is unclear if patient had another syncopal episode or truly mechanical fall.  He is a very poor historian.  Certainly suspect that based on his labs and his history of orthostasis and chronic hypotension that he had another episode of orthostasis and it syncope secondary to this.  Patient did have an echo in 2017 when he had a prior episode of possible syncope that was fairly similar to this.  It showed only grade 1 diastolic dysfunction.  At this time the utility of additional echocardiogram with no evidence of structural heart disease on EKG as well as no history of chest pain  is dubious.  Suspect most likely the patient because of his significant history of head and neck radiation and surgery became dehydrated due to his poor p.o. intake and this exaggerated his already underlying orthostatic hypotension.  Suspect that the elevated lactic acid is secondary to dehydration is at this time he has no evidence of active infection including a negative urinalysis. -IV fluids -Physical therapy consult -Telemetry bed - We will hold on echocardiogram at this time  #) Chronic hypotension: - Continue midodrine 10 mg 3 times daily  #) Chronic grade 1 diastolic heart failure: -Hold furosemide 20 mg daily  #) Squamous cell carcinoma head and neck: At this time this appears to be in remission  #) CLL: This appears to be only stage I and is not being actively treated  Fluids: Gentle IV fluids Electrolyte: Monitor and supplement Nutrition: Regular diet  Prophylaxis: Enoxaparin  Disposition: Pending PT evaluation and IV fluids  DO NOT RESUSCITATE     Cristy Folks MD [facility]   If 7PM-7AM, please contact night-coverage [email or web address] Password [email or web address]  03/21/2018, 11:40 AM

## 2018-03-21 NOTE — ED Provider Notes (Signed)
[facility] DEPT Provider Note   CSN: [id number] Arrival date & time: 03/21/18  3557     History   Chief Complaint Chief Complaint  Patient presents with  . Fall    HPI CORDELRO GAUTREAU is a 84 y.o. male.  Patient with history of CLL, squamous cell carcinoma of the face who presents the ED after a fall at home.  Patient was walking with his walker and fell backwards landing on his elbows.  He did not hit his head, no loss of consciousness.  Patient is not on any blood thinners.  Patient denies any chest pain, shortness of breath.  Denies any syncope type symptoms.  No dizziness, no nausea.  Patient with pain in both elbows.  Patient lives at home with his wife.  He used to have home health but he no longer uses her services.  He is not undergoing any chemotherapy at this time.  The history is provided by the patient.  Fall  This is a new problem. The current episode started 1 to 2 hours ago. The problem occurs constantly. The problem has not changed since onset.Pertinent negatives include no chest pain, no abdominal pain, no headaches and no shortness of breath. Nothing aggravates the symptoms. Nothing relieves the symptoms. He has tried nothing for the symptoms. The treatment provided no relief.    Past Medical History:  Diagnosis Date  . Alcohol abuse   . Allergic rhinitis   . Anemia in neoplastic disease    chronic  . Bilateral lower extremity edema    feet  . Borderline diabetes   . BPH (benign prostatic hypertrophy)   . CLL (chronic lymphocytic leukemia) [facility]) oncologist-  dr Heath Lark ([facility])   dx Jan 2014 --- Stage 1--  currently asymptomatic as of Apr 2016 and No active disease  . DDD (degenerative disc disease), cervical   . DDD (degenerative disc disease), lumbosacral   . Derangement of TMJ (temporomandibular joint) right side   post op radical neck dissection 07-21-2014--  misalignment right side mandible--  causes  discomfort with chewy food like steak--  changed diet to accommendate  . Dysphasia functional--  speech therapy   post op  radical neck dissection 07-21-2014--  changed diet to no chewy food , softer foods , states with the changes swallowed okay without any issues  . Glaucoma    both eyes  . History of gastroesophageal reflux (GERD)   . History of kidney stones    right side--  per CT from [facility] 01-17-2015 non-obstructing  . History of radiation therapy    09-04-2014 to 10-13-2014  Right retromolar region (tumor bed) and right neck/  60Gy in 30 fractions to tumor bed and 54Gy in 30 fractions to neck  . History of radiation to head and neck region 09-04-2014 to  10-13-2014   right retromolar region and right neck  60 Gy  . History of syncope    a. Event monitor 11/17: No evidence of significant AVB or > 3 second pause.; plan ILR if recurrent syncope in the future  . History of tracheostomy    post op radical neck dissection  07-21-2014--  post op tracheostomy on 07-25-2014  decannulated and sutured 07-29-2014  . Hypertension   . Macular degeneration    right eye only  . MRSA (methicillin resistant staph aureus) culture positive   . OA (osteoarthritis)   . Organic impotence   . Pneumonia 12/2015  . Pulmonary nodule  benign  and stable per last CT  . Radiation-induced dermatitis   . 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 Thoracic ascending aortic aneurysm ([place]) 03/28/2016   Chest CTA 9/17: 4.6 cm fusiform ascending thoracic aortic aneurysm // Chest CTA 4/18: Ascending thoracic aortic aneurysm 4.2 cm >> repeat 1 year  . 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 Venous insufficiency     Patient Active Problem List     Diagnosis Date Noted  . Fall at home, initial encounter 03/21/2018  . Dehydration 03/21/2018  . Fall 03/21/2018  . Drug-induced hypotension 10/20/2017  . Laceration of multiple sites of skin 10/20/2017  . Dysphagia 10/16/2016  . Abdominal pain, generalized 07/23/2016  . Bilateral leg edema 03/28/2016  . Thoracic ascending aortic aneurysm ([place]) 03/28/2016  . Syncope 03/19/2016  . Weakness generalized 03/19/2016  . Macrocytic anemia 03/19/2016  . Pneumonia 03/18/2016  . Drug-induced hypokalemia 10/16/2015  . History of bladder cancer 02/08/2015  . Anemia in neoplastic disease 10/17/2014  . 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 Squamous cell carcinoma of retromolar trigone ([place]) 06/07/2014  . Abnormal weight loss 05/31/2014  . Cancer of the lip, oral cavity, and pharynx ([place]) 05/30/2014  . CLL (chronic lymphocytic leukemia) ([place]) 07/07/2012  . ALLERGIC RHINITIS 02/09/2008  . 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 Osteoarthritis 06/09/2007  . LOW BACK PAIN 06/09/2007  . Diabetes mellitus without complication ([place]) 76/16/0737  . Essential hypertension 03/03/2007  . GERD 03/03/2007  . BPH (benign prostatic hyperplasia) 03/03/2007    Past Surgical History:  Procedure Laterality Date  . CATARACT EXTRACTION W/ INTRAOCULAR LENS  IMPLANT, BILATERAL    . COLONOSCOPY N/A 07/23/2016   Procedure: COLONOSCOPY;  Surgeon: Wilford Corner, MD;  Location: [facility] ENDOSCOPY;  Service: Endoscopy;  Laterality: N/A;  . CYSTOSCOPY N/A 02/02/2015   Procedure: CYSTOSCOPY, INSTILLATION OF MITOMYCIN C;  Surgeon: Lowella Bandy, MD;  Location: [facility];  Service: Urology;  Laterality: N/A;  . CYSTOSCOPY WITH BIOPSY N/A 03/27/2015   Procedure: CYSTOSCOPY WITH BLADDER  BIOPSY WITH FULGERATION;  Surgeon: Festus Aloe, MD;  Location[facility];  Service: Urology;  Laterality: N/A;  . ESOPHAGOGASTRODUODENOSCOPY N/A 07/23/2016   Procedure: ESOPHAGOGASTRODUODENOSCOPY (EGD);  Surgeon: Wilford Corner, MD;  Location[facility] ENDOSCOPY;  Service: Endoscopy;  Laterality: N/A;  . INGUINAL HERNIA REPAIR Bilateral right 1988//  left 1970's  . LAMINECTOMY WITH POSTERIOR LATERAL ARTHRODESIS LEVEL 1  09-01-2003   L3 -5 LAMINECTOMY W/ DECOMPRESSION AND FUSION L4-5  . MAXILLECTOMY Right 07/21/14,   [facility]   Radical Neck Dissection, Maxillectomy, Right Marginal Mandibulectomy, dental extractions, Parascauplar fasciocutaeous Free flap reconstruction  . RETINAL DETACHMENT REPAIR W/ SCLERAL BUCKLE LE  11-03-2000  . ROTATOR CUFF REPAIR Right 12-11-2000  . tonsil biopsy Right 05/24/14  . TRANSURETHRAL RESECTION OF BLADDER TUMOR N/A 02/02/2015   Procedure: TRANSURETHRAL RESECTION OF BLADDER TUMOR (TURBT);  Surgeon: Lowella Bandy, MD;  Location: [facility];  Service: Urology;  Laterality: N/A;        Home Medications    Prior to Admission medications   Medication Sig Start Date End Date Taking? Authorizing Provider  brimonidine-timolol (COMBIGAN) 0.2-0.5 % ophthalmic solution Place 1 drop into both eyes 2 (two)  times daily.    Yes [provider]  furosemide (LASIX) 20 MG tablet Take 20 mg by mouth daily. 02/09/15  Yes [provider]  latanoprost (XALATAN) 0.005 % ophthalmic solution Place 1 drop into both eyes every evening.  06/01/14  Yes [provider]  midodrine (PROAMATINE) 10 MG tablet TAKE 1 TABLET 3 TIMES A DAY WITH MEALS 03/08/18  Yes Marletta Lor, MD  RHOPRESSA 0.02 % SOLN Place 1 drop into the right eye daily. 09/28/17  Yes [provider]  feeding supplement, ENSURE ENLIVE, (ENSURE ENLIVE) LIQD Take 237 mLs by mouth 2 (two) times daily between meals. Patient not taking: Reported on 03/21/2018 03/22/16   Hosie Poisson, MD  midodrine (PROAMATINE) 2.5 MG tablet TAKE 1 TABLET BY MOUTH 3 TIMES DAILY WITH MEALS. Patient not taking: Reported on 03/21/2018 02/11/18   Marletta Lor, MD  sodium fluoride (DENTA 5000 PLUS) 1.1 % CREA dental cream  Apply a thin ribbon of gel to tooth brush. Brush teeth for 2 minutes. Spit out excess. Repeat nightly. Patient not taking: Reported on 11/01/2017 07/03/15   Lenn Cal, DDS  triamcinolone cream (KENALOG) 0.1 % Apply topically 2 (two) times daily. Patient not taking: Reported on 03/21/2018 02/26/17   Marletta Lor, MD    Family History Family History  Problem Relation Age of Onset  . Cancer Mother        lung ca  . Alcohol abuse Sister   . Hypertension Neg Hx        family hx  . Sudden death Neg Hx        famiylhx  . Heart attack Neg Hx     Social History Social History   Tobacco Use  . Smoking status: Never Smoker  . Smokeless tobacco: Never Used  Substance Use Topics  . Alcohol use: Yes    Comment: vodka 3-4 daily   (Hx alcohol withdrawal )  . Drug use: No     Allergies   Patient has no known allergies.   Review of Systems Review of Systems  Constitutional: Negative for chills and fever.  HENT: Negative for ear pain and sore throat.   Eyes: Negative for pain and visual disturbance.  Respiratory: Negative for cough and shortness of breath.   Cardiovascular: Negative for chest pain and palpitations.  Gastrointestinal: Negative for abdominal pain and vomiting.  Genitourinary: Negative for dysuria and hematuria.  Musculoskeletal: Positive for arthralgias. Negative for back pain.  Skin: Negative for color change and rash.  Neurological: Negative for seizures, syncope and headaches.  All other systems reviewed and are negative.    Physical Exam Updated Vital Signs  ED Triage Vitals [03/21/18 0848]  Enc Vitals Group     BP 115/78     Pulse Rate 70     Resp 18     Temp 97.6 F (36.4 C)     Temp Source Oral     SpO2 99 %     Weight      Height      Head Circumference      Peak Flow      Pain Score      Pain Loc      Pain Edu?      Excl. in [place]?     Physical Exam  Constitutional: He is oriented to person, place, and time. He appears  cachectic.  HENT:  Head: Normocephalic and atraumatic.  Mouth/Throat: No oropharyngeal exudate.  Dry mucous membranes   Eyes:  Pupils are equal, round, and reactive to light. Conjunctivae and EOM are normal.  Neck: Normal range of motion. Neck supple.  Cardiovascular: Normal rate, regular rhythm, normal heart sounds and intact distal pulses.  No murmur heard. Pulmonary/Chest: Effort normal and breath sounds normal. No respiratory distress.  Abdominal: Soft. Bowel sounds are normal. There is no tenderness.  Musculoskeletal: Normal range of motion. He exhibits tenderness. He exhibits no edema.  Patient with tenderness to both elbows and both knees.  Neurological: He is alert and oriented to person, place, and time. No cranial nerve deficit or sensory deficit. He exhibits normal muscle tone. Coordination normal.  5+ out of 5 strength throughout, normal sensation, no drift, normal finger-to-nose finger  Skin: Skin is warm and dry.  Abrasions to bilateral elbows, bilateral knees  Psychiatric: He has a normal mood and affect.  Nursing note and vitals reviewed.    ED Treatments / Results  Labs (all labs ordered are listed, but only abnormal results are displayed) Labs Reviewed  COMPREHENSIVE METABOLIC PANEL - Abnormal; Notable for the following components:      Result Value   Sodium 133 (*)    Potassium 3.1 (*)    Chloride 74 (*)    CO2 39 (*)    Glucose, Bld 109 (*)    Albumin 3.3 (*)    Total Bilirubin 1.4 (*)    Anion gap 20 (*)    All other components within normal limits  CBC WITH DIFFERENTIAL/PLATELET - Abnormal; Notable for the following components:   RBC 3.62 (*)    HCT 36.7 (*)    MCV 101.4 (*)    MCH 36.5 (*)    All other components within normal limits  BLOOD GAS, VENOUS - Abnormal; Notable for the following components:   pH, Ven 7.535 (*)    Bicarbonate 44.5 (*)    Acid-Base Excess 18.5 (*)    All other components within normal limits  I-STAT CG4 LACTIC ACID, ED -  Abnormal; Notable for the following components:   Lactic Acid, Venous 3.46 (*)    All other components within normal limits  CG4 I-STAT (LACTIC ACID) - Abnormal; Notable for the following components:   Lactic Acid, Venous 2.61 (*)    All other components within normal limits  URINALYSIS, ROUTINE W REFLEX MICROSCOPIC  ETHANOL  CBG MONITORING, ED  I-STAT CG4 LACTIC ACID, ED    EKG EKG Interpretation  Date/Time:  Sunday March 21 2018 09:05:38 EDT Ventricular Rate:  66 PR Interval:    QRS Duration: 111 QT Interval:  474 QTC Calculation: 497 R Axis:   35 Text Interpretation:  Sinus rhythm Low voltage, extremity leads Confirmed by Lennice Sites [phone]) on 03/21/2018 10:28:59 AM   Radiology Dg Chest 2 View  Result Date: 03/21/2018 CLINICAL DATA:  Status post fall. EXAM: CHEST - 2 VIEW COMPARISON:  CT chest 09/26/2016, 01/04/2015 FINDINGS: There is no focal consolidation. There is a 14 mm right lower lobe pulmonary nodule smaller compared with prior CT chest dated 01/04/2015. There is no pleural effusion or pneumothorax. The heart and mediastinal contours are unremarkable. The osseous structures are unremarkable. IMPRESSION: No active cardiopulmonary disease. 14 mm right lower lobe pulmonary nodule smaller compared with prior CT chest dated 01/04/2015. Electronically Signed   By: Kathreen Devoid   On: 03/21/2018 12:47   Dg Pelvis 1-2 Views  Result Date: 03/21/2018 CLINICAL DATA:  Pain after fall EXAM: PELVIS - 1-2 VIEW COMPARISON:  None. FINDINGS: The right hip is internally rotated  limiting evaluation. No obvious fracture in the right hip. Left hip appears grossly intact. The pelvic bones are without obvious fracture. IMPRESSION: No identified fractures. Mildly limited evaluation of the right hip due to internal rotation. Electronically Signed   By: Dorise Bullion III M.D   On: 03/21/2018 11:15   Dg Elbow Complete Left  Result Date: 03/21/2018 CLINICAL DATA:  Status post fall.  Left  elbow pain. EXAM: LEFT ELBOW - COMPLETE 3+ VIEW COMPARISON:  None. FINDINGS: There is no evidence of fracture, dislocation, or joint effusion. There is no evidence of arthropathy or other focal bone abnormality. There is generalized osteopenia. There are mild enthesopathic changes at the common flexor tendon origin. Soft tissues are unremarkable. IMPRESSION: No acute osseous injury of the left elbow. Electronically Signed   By: Kathreen Devoid   On: 03/21/2018 11:12   Dg Elbow Complete Right  Result Date: 03/21/2018 CLINICAL DATA:  Status post fall. EXAM: RIGHT ELBOW - COMPLETE 3+ VIEW COMPARISON:  None. FINDINGS: There is no evidence of fracture, dislocation, or joint effusion. There is no evidence of arthropathy or other focal bone abnormality. There is generalized osteopenia mild enthesopathic changes at the common flexor tendon origin. Soft tissues are unremarkable. IMPRESSION: No acute osseous injury of the right elbow. Electronically Signed   By: Kathreen Devoid   On: 03/21/2018 11:11   Dg Knee Complete 4 Views Left  Result Date: 03/21/2018 CLINICAL DATA:  Pain following fall EXAM: LEFT KNEE - COMPLETE 4+ VIEW COMPARISON:  March 18, 2016 FINDINGS: Frontal, lateral, and bilateral oblique views were obtained. There is a bipartite patella, an anatomic variant. No evident fracture or dislocation. No joint effusion. Joint spaces appear unremarkable. There is slight chondrocalcinosis. No erosive change. IMPRESSION: Slight chondrocalcinosis, a finding that may be seen with osteoarthritis or calcium pyrophosphate deposition disease. No appreciable joint space narrowing or erosion. No fracture or joint effusion. Bipartite patella noted, an anatomic variant. Electronically Signed   By: Lowella Grip III M.D.   On: 03/21/2018 11:13   Dg Knee Complete 4 Views Right  Result Date: 03/21/2018 CLINICAL DATA:  Pain following fall EXAM: RIGHT KNEE - COMPLETE 4+ VIEW COMPARISON:  Right knee radiographs and right  knee CT March 18, 2018 FINDINGS: Frontal, lateral, and bilateral oblique views were obtained. There is an apparent bipartite patella, stable. No acute fracture or dislocation. No joint effusion. There is slight narrowing medially and in the patellofemoral joint regions. There is chondrocalcinosis. No erosive change. IMPRESSION: No acute fracture or dislocation. No joint effusion. Bipartite patella, an anatomic variant. There is joint space narrowing medially and in the patellofemoral joint regions, similar to prior study. Chondrocalcinosis present, a finding that may be due to osteoarthritis or calcium pyrophosphate deposition disease. Electronically Signed   By: Lowella Grip III M.D.   On: 03/21/2018 11:15    Procedures Procedures (including critical care time)  Medications Ordered in ED Medications  midodrine (PROAMATINE) tablet 10 mg (has no administration in time range)  latanoprost (XALATAN) 0.005 % ophthalmic solution 1 drop (has no administration in time range)  Netarsudil Dimesylate 0.02 % SOLN 1 drop (has no administration in time range)  enoxaparin (LOVENOX) injection 40 mg (has no administration in time range)  0.9 %  sodium chloride infusion ( Intravenous New Bag/Given 03/21/18 1526)  acetaminophen (TYLENOL) tablet 650 mg (has no administration in time range)    Or  acetaminophen (TYLENOL) suppository 650 mg (has no administration in time range)  ibuprofen (ADVIL,MOTRIN) tablet 600  mg (has no administration in time range)  polyethylene glycol (MIRALAX / GLYCOLAX) packet 17 g (has no administration in time range)  ondansetron (ZOFRAN) tablet 4 mg (has no administration in time range)    Or  ondansetron (ZOFRAN) injection 4 mg (has no administration in time range)  brimonidine (ALPHAGAN) 0.2 % ophthalmic solution 1 drop (has no administration in time range)    And  timolol (TIMOPTIC) 0.5 % ophthalmic solution 1 drop (has no administration in time range)  Influenza vac split  quadrivalent PF (FLUZONE HIGH-DOSE) injection 0.5 mL (has no administration in time range)  MEDLINE mouth rinse (has no administration in time range)  sodium chloride 0.9 % bolus 1,000 mL (0 mLs Intravenous Stopped 03/21/18 1058)  0.9 %  sodium chloride infusion ( Intravenous Stopping Infusion hung by another clincian 03/21/18 1525)  potassium chloride 20 MEQ/15ML (10%) solution 40 mEq (40 mEq Oral Given 03/21/18 1057)     Initial Impression / Assessment and Plan / ED Course  I have reviewed the triage vital signs and the nursing notes.  Pertinent labs & imaging results that were available during my care of the patient were reviewed by me and considered in my medical decision making (see chart for details).     ULYSEES ROBARTS is an 84 year old male with history of CLL, squamous cell carcinoma of the face who presents to the ED with fall.  Patient with normal vitals.  No fever.  Patient was using his walker while attempting to go to the bathroom and fell backwards landing on his elbows.  Denies any loss of consciousness, no headaches.  Has pain to both of his elbows.  Has abrasions over both elbows and both knees.  Patient called EMS.  Denies any chest pain, shortness of breath, abdominal pain.  Denies any urinary symptoms.  Overall patient appears cachectic and unkept.  He appears clinically dehydrated.  Has dry mucous membranes.  Patient used to have home health but no longer wants them.  He lives at home with his wife.  He is not undergoing any chemotherapy at this time.  He has normal neurological exam.  Will obtain x-rays of bilateral knees, bilateral elbows.  Patient has no midline spinal tenderness.  Will obtain basic labs, EKG, urinalysis.  Will give IV saline bolus as concern for dehydration possible UTI. Upon chart review patient also has orthostatic hypotension and is on midodrine, has had multiple falls in past and possible reason for fall today other than mechanical fall.  Patient with  elevated lactic acid at 3.46.  Which I believe is likely from dehydration.  Urinalysis unremarkable for infection.  Chest x-ray unremarkable for infection.  Patient with unremarkable creatinine.  Patient with no leukocytosis.  Patient with mild hypokalemia.  Elevated anion gap.  Low chloride.  pH is alkalotic.  Patient with multiple metabolic derangements.  Suspect that patient had orthostatic fall today which is significant with his history.  Believe he is volume depleted and overall not taking care of himself well at home.  Patient improved after IV fluids and potassium.  To be admitted to hospitalist service for further hydration and will likely require home health or short-term placement.  Final Clinical Impressions(s) / ED Diagnoses   Final diagnoses:  Dehydration  Hypokalemia  Orthostasis  Elevated lactic acid level    ED Discharge Orders    None       Lennice Sites, DO 03/21/18 1617

## 2018-03-22 DIAGNOSIS — Y92009 Unspecified place in unspecified non-institutional (private) residence as the place of occurrence of the external cause: Secondary | ICD-10-CM | POA: Diagnosis not present

## 2018-03-22 DIAGNOSIS — I952 Hypotension due to drugs: Secondary | ICD-10-CM | POA: Diagnosis present

## 2018-03-22 DIAGNOSIS — R7989 Other specified abnormal findings of blood chemistry: Secondary | ICD-10-CM

## 2018-03-22 DIAGNOSIS — Z743 Need for continuous supervision: Secondary | ICD-10-CM | POA: Diagnosis not present

## 2018-03-22 DIAGNOSIS — R296 Repeated falls: Secondary | ICD-10-CM | POA: Diagnosis present

## 2018-03-22 DIAGNOSIS — E860 Dehydration: Secondary | ICD-10-CM | POA: Diagnosis present

## 2018-03-22 DIAGNOSIS — Z23 Encounter for immunization: Secondary | ICD-10-CM | POA: Diagnosis not present

## 2018-03-22 DIAGNOSIS — R55 Syncope and collapse: Secondary | ICD-10-CM | POA: Diagnosis present

## 2018-03-22 DIAGNOSIS — D630 Anemia in neoplastic disease: Secondary | ICD-10-CM | POA: Diagnosis present

## 2018-03-22 DIAGNOSIS — M25522 Pain in left elbow: Secondary | ICD-10-CM | POA: Diagnosis present

## 2018-03-22 DIAGNOSIS — W1830XA Fall on same level, unspecified, initial encounter: Secondary | ICD-10-CM | POA: Diagnosis present

## 2018-03-22 DIAGNOSIS — N400 Enlarged prostate without lower urinary tract symptoms: Secondary | ICD-10-CM | POA: Diagnosis present

## 2018-03-22 DIAGNOSIS — Z515 Encounter for palliative care: Secondary | ICD-10-CM | POA: Diagnosis not present

## 2018-03-22 DIAGNOSIS — S5002XA Contusion of left elbow, initial encounter: Secondary | ICD-10-CM | POA: Diagnosis present

## 2018-03-22 DIAGNOSIS — M25521 Pain in right elbow: Secondary | ICD-10-CM | POA: Diagnosis present

## 2018-03-22 DIAGNOSIS — Z66 Do not resuscitate: Secondary | ICD-10-CM | POA: Diagnosis present

## 2018-03-22 DIAGNOSIS — E43 Unspecified severe protein-calorie malnutrition: Secondary | ICD-10-CM | POA: Diagnosis present

## 2018-03-22 DIAGNOSIS — W19XXXA Unspecified fall, initial encounter: Secondary | ICD-10-CM | POA: Diagnosis not present

## 2018-03-22 DIAGNOSIS — Z7189 Other specified counseling: Secondary | ICD-10-CM | POA: Diagnosis not present

## 2018-03-22 DIAGNOSIS — R5381 Other malaise: Secondary | ICD-10-CM | POA: Diagnosis not present

## 2018-03-22 DIAGNOSIS — I712 Thoracic aortic aneurysm, without rupture: Secondary | ICD-10-CM | POA: Diagnosis present

## 2018-03-22 DIAGNOSIS — R278 Other lack of coordination: Secondary | ICD-10-CM | POA: Diagnosis not present

## 2018-03-22 DIAGNOSIS — R531 Weakness: Secondary | ICD-10-CM | POA: Diagnosis not present

## 2018-03-22 DIAGNOSIS — E876 Hypokalemia: Secondary | ICD-10-CM | POA: Diagnosis not present

## 2018-03-22 DIAGNOSIS — S5001XA Contusion of right elbow, initial encounter: Secondary | ICD-10-CM | POA: Diagnosis present

## 2018-03-22 DIAGNOSIS — H409 Unspecified glaucoma: Secondary | ICD-10-CM | POA: Diagnosis present

## 2018-03-22 DIAGNOSIS — I872 Venous insufficiency (chronic) (peripheral): Secondary | ICD-10-CM | POA: Diagnosis present

## 2018-03-22 DIAGNOSIS — M1990 Unspecified osteoarthritis, unspecified site: Secondary | ICD-10-CM | POA: Diagnosis present

## 2018-03-22 DIAGNOSIS — R638 Other symptoms and signs concerning food and fluid intake: Secondary | ICD-10-CM | POA: Diagnosis not present

## 2018-03-22 DIAGNOSIS — I5032 Chronic diastolic (congestive) heart failure: Secondary | ICD-10-CM | POA: Diagnosis present

## 2018-03-22 DIAGNOSIS — R2689 Other abnormalities of gait and mobility: Secondary | ICD-10-CM | POA: Diagnosis not present

## 2018-03-22 DIAGNOSIS — C9110 Chronic lymphocytic leukemia of B-cell type not having achieved remission: Secondary | ICD-10-CM | POA: Diagnosis present

## 2018-03-22 DIAGNOSIS — C062 Malignant neoplasm of retromolar area: Secondary | ICD-10-CM | POA: Diagnosis not present

## 2018-03-22 DIAGNOSIS — Y9301 Activity, walking, marching and hiking: Secondary | ICD-10-CM | POA: Diagnosis present

## 2018-03-22 DIAGNOSIS — R627 Adult failure to thrive: Secondary | ICD-10-CM | POA: Diagnosis present

## 2018-03-22 DIAGNOSIS — I10 Essential (primary) hypertension: Secondary | ICD-10-CM | POA: Diagnosis not present

## 2018-03-22 DIAGNOSIS — H3530 Unspecified macular degeneration: Secondary | ICD-10-CM | POA: Diagnosis present

## 2018-03-22 DIAGNOSIS — R1312 Dysphagia, oropharyngeal phase: Secondary | ICD-10-CM | POA: Diagnosis not present

## 2018-03-22 DIAGNOSIS — I110 Hypertensive heart disease with heart failure: Secondary | ICD-10-CM | POA: Diagnosis present

## 2018-03-22 DIAGNOSIS — K219 Gastro-esophageal reflux disease without esophagitis: Secondary | ICD-10-CM | POA: Diagnosis present

## 2018-03-22 DIAGNOSIS — Z681 Body mass index (BMI) 19 or less, adult: Secondary | ICD-10-CM | POA: Diagnosis not present

## 2018-03-22 DIAGNOSIS — R279 Unspecified lack of coordination: Secondary | ICD-10-CM | POA: Diagnosis not present

## 2018-03-22 LAB — CBC
HCT: 33.9 % — ABNORMAL LOW (ref 39.0–52.0)
Hemoglobin: 11.7 g/dL — ABNORMAL LOW (ref 13.0–17.0)
MCH: 36 pg — ABNORMAL HIGH (ref 26.0–34.0)
MCHC: 34.5 g/dL (ref 30.0–36.0)
MCV: 104.3 fL — ABNORMAL HIGH (ref 78.0–100.0)
Platelets: 180 10*3/uL (ref 150–400)
RBC: 3.25 MIL/uL — ABNORMAL LOW (ref 4.22–5.81)
RDW: 13.6 % (ref 11.5–15.5)
WBC: 8.4 K/uL (ref 4.0–10.5)

## 2018-03-22 LAB — BASIC METABOLIC PANEL WITH GFR
Anion gap: 10 (ref 5–15)
Calcium: 8.4 mg/dL — ABNORMAL LOW (ref 8.9–10.3)
GFR calc Af Amer: >60 mL/min (ref >60)
GFR calc non Af Amer: >60 mL/min (ref >60)
Glucose, Bld: 96 mg/dL (ref 70–99)
Potassium: 2.8 mmol/L — ABNORMAL LOW (ref 3.5–5.1)
Sodium: 138 mmol/L (ref 135–145)

## 2018-03-22 LAB — BASIC METABOLIC PANEL
BUN: 17 mg/dL (ref 8–23)
CO2: 40 mmol/L — ABNORMAL HIGH (ref 22–32)
Chloride: 88 mmol/L — ABNORMAL LOW (ref 98–111)
Creatinine, Ser: 0.69 mg/dL (ref 0.61–1.24)

## 2018-03-22 LAB — MAGNESIUM: MAGNESIUM: 1.4 mg/dL — AB (ref 1.7–2.4)

## 2018-03-22 MED ORDER — POTASSIUM CHLORIDE 10 MEQ/100ML IV SOLN
10.0000 meq | INTRAVENOUS | Status: AC
  Administered 2018-03-22 (×6): 10 meq via INTRAVENOUS
  Filled 2018-03-22 (×6): qty 100

## 2018-03-22 MED ORDER — ENSURE ENLIVE PO LIQD
237.0000 mL | Freq: Three times a day (TID) | ORAL | Status: DC
  Administered 2018-03-22 – 2018-03-25 (×5): 237 mL via ORAL

## 2018-03-22 MED ORDER — ADULT MULTIVITAMIN W/MINERALS CH
1.0000 | ORAL_TABLET | Freq: Every day | ORAL | Status: DC
  Administered 2018-03-22 – 2018-03-25 (×4): 1 via ORAL
  Filled 2018-03-22 (×4): qty 1

## 2018-03-22 MED ORDER — POTASSIUM CHLORIDE CRYS ER 20 MEQ PO TBCR: 40.0000 meq | EXTENDED_RELEASE_TABLET | Freq: Two times a day (BID) | ORAL | Status: DC

## 2018-03-22 MED ORDER — SODIUM CHLORIDE 0.9 % IV SOLN
INTRAVENOUS | Status: DC | PRN
  Administered 2018-03-22 – 2018-03-23 (×2): 250 mL via INTRAVENOUS

## 2018-03-22 MED ORDER — MAGNESIUM SULFATE 4 GM/100ML IV SOLN
4.0000 g | Freq: Once | INTRAVENOUS | Status: AC
  Administered 2018-03-22: 4 g via INTRAVENOUS
  Filled 2018-03-22: qty 100

## 2018-03-22 MED ORDER — TRAZODONE HCL 50 MG PO TABS
50.0000 mg | ORAL_TABLET | Freq: Once | ORAL | Status: AC
  Administered 2018-03-22: 50 mg via ORAL
  Filled 2018-03-22: qty 1

## 2018-03-22 NOTE — Progress Notes (Signed)
PROGRESS NOTE    Timothy Kim  WIO:[id number] DOB: 06/16/1930 DOA: 03/21/2018 PCP: Marletta Lor, MD    Brief Narrative:  84 y.o. 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  Patient reports that he got up from bed and went to use the restroom with his walker and then abruptly fell.  He is not sure if he had a syncopal episode or mechanical fall as he is a history of both however he reports he feels like it was most likely a mechanical fall.  He fell and bruised his bilateral knees and his bilateral elbows.  He never hit his head or had complete syncope.  He denies any chest pain, palpitations, orthopnea, lower extremity edema, shortness of breath.  He reports otherwise being in fairly good health without any nausea, vomiting, abdominal pain, chest pain.  He does report that he has chronically poor p.o. due to his significant head and neck surgery and radiation.  He apparently has a history of syncope approximately 2 years ago with a plan to consider implantation of a loop recorder.  He is also noted to have a history of hypertension that is now progressed to chronic hypotension and significant orthostasis with this requiring midodrine.  He reports that he does attempt to use compensatory mechanisms when changing positions including the walker and standing for several minutes.  ED Course: In the ED patient's vitals were stable.  Labs are notable for mild hypokalemia 3.1, lactate of 3.46, sodium 133, white blood cell count of 6.9, normal hemoglobin and hematocrit.  X-ray of bilateral elbows knees and pelvis showed no acute fracture but did show contusions.  Patient was given IV fluids and potassium supplementation and called for admission  due to elevated lactate and concern for dehydration.   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)   Weakness generalized   Thoracic ascending aortic aneurysm (HCC)   Drug-induced hypotension   Fall at home, initial encounter   Dehydration   Fall  #) Fall/possible syncope:  -Patient reports long hx of poor po intake, likely related to hx of squamous cell head and neck CA -Clinically appears dehydrated with poor skin turgor -Continue on IVF hydration -Orthostatics are neg on exam currently -Will continue with IVF hydration -PT consulted  #) Chronic hypotension: - Continue midodrine 10 mg 3 times daily - Stable currently  #) Chronic grade 1 diastolic heart failure: -Clinically dehydrated per above -Lasix on hold -Cont IVF hydration  #) Squamous cell carcinoma head and neck: At this time this appears to be in remission -Stable at present  #) CLL: Appears stable at this time   DVT prophylaxis: Lovenox subQ Code Status: DNR Family Communication: Pt in room, family not at bedside Disposition Plan: Uncertain at this time  Consultants:     Procedures:     Antimicrobials: Anti-infectives (From admission, onward)   None       Subjective: Reports feeling tired  Objective: Vitals:   03/21/18 1618 03/21/18 2028 03/22/18 0410 03/22/18 1432  BP:  99/63 (!) 99/59   Pulse:  91 67   Resp:  16 16   Temp:  Marland Kitchen)  97.5 F (36.4 C) 98.2 F (36.8 C)   TempSrc:  Oral Oral   SpO2:  98% 97% 92%  Weight: 50.3 kg     Height: 5\' 7"  (1.702 m)       Intake/Output Summary (Last 24 hours) at 03/22/2018 1751 Last data filed at 03/22/2018 1700 Gross per 24 hour  Intake 2133.33 ml  Output 325 ml  Net 1808.33 ml   Filed Weights   03/21/18 1618  Weight: 50.3 kg    Examination:  General exam: Appears calm and comfortable   Respiratory system: Clear to auscultation. Respiratory effort normal. Cardiovascular system: S1 & S2 heard, RRR. Gastrointestinal system: Abdomen is nondistended, soft and nontender. No organomegaly or masses felt. Normal bowel sounds heard. Central nervous system: Alert and oriented. No focal neurological deficits. Extremities: Symmetric 5 x 5 power. Skin: No rashes, lesions, decreased skin turgor Psychiatry: Judgement and insight appear normal. Mood & affect appropriate.   Data Reviewed: I have personally reviewed following labs and imaging studies  CBC: Recent Labs  Lab 03/21/18 1018 03/22/18 0525  WBC 6.9 8.4  NEUTROABS 3.7  --   HGB 13.2 11.7*  HCT 36.7* 33.9*  MCV 101.4* 104.3*  PLT 211 782   Basic Metabolic Panel: Recent Labs  Lab 03/21/18 0851 03/22/18 0525  NA 133* 138  K 3.1* 2.8*  CL 74* 88*  CO2 39* 40*  GLUCOSE 109* 96  BUN 19 17  CREATININE 0.83 0.69  CALCIUM 9.6 8.4*  MG  --  1.4*   GFR: Estimated Creatinine Clearance: 48 mL/min (by C-G formula based on SCr of 0.69 mg/dL). Liver Function Tests: Recent Labs  Lab 03/21/18 0851  AST 31  ALT 11  ALKPHOS 84  BILITOT 1.4*  PROT 6.7  ALBUMIN 3.3*   No results for input(s): LIPASE, AMYLASE in the last 168 hours. No results for input(s): AMMONIA in the last 168 hours. Coagulation Profile: No results for input(s): INR, PROTIME in the last 168 hours. Cardiac Enzymes: No results for input(s): CKTOTAL, CKMB, CKMBINDEX, TROPONINI in the last 168 hours. BNP (last 3 results) No results for input(s): PROBNP in the last 8760 hours. HbA1C: No results for input(s): HGBA1C in the last 72 hours. CBG: Recent Labs  Lab 03/21/18 0908  GLUCAP 98   Lipid Profile: No results for input(s): CHOL, HDL, LDLCALC, TRIG, CHOLHDL, LDLDIRECT in the last 72 hours. Thyroid Function Tests: No results for input(s): TSH, T4TOTAL, FREET4, T3FREE, THYROIDAB in the last 72 hours. Anemia Panel: No results for input(s):  VITAMINB12, FOLATE, FERRITIN, TIBC, IRON, RETICCTPCT in the last 72 hours. Sepsis Labs: Recent Labs  Lab 03/21/18 1025 03/21/18 1358  LATICACIDVEN 3.46* 2.61*    No results found for this or any previous visit (from the past 240 hour(s)).   Radiology Studies: Dg Chest 2 View  Result Date: 03/21/2018 CLINICAL DATA:  Status post fall. EXAM: CHEST - 2 VIEW COMPARISON:  CT chest 09/26/2016, 01/04/2015 FINDINGS: There is no focal consolidation. There is a 14 mm right lower lobe pulmonary nodule smaller compared with prior CT chest dated 01/04/2015. There is no pleural effusion or pneumothorax. The heart and mediastinal contours are unremarkable. The osseous structures are unremarkable. IMPRESSION: No active cardiopulmonary disease. 14 mm right lower lobe pulmonary nodule smaller compared with prior CT chest dated 01/04/2015. Electronically Signed   By: Kathreen Devoid   On: 03/21/2018 12:47   Dg Pelvis 1-2 Views  Result Date: 03/21/2018 CLINICAL DATA:  Pain after fall EXAM: PELVIS - 1-2  VIEW COMPARISON:  None. FINDINGS: The right hip is internally rotated limiting evaluation. No obvious fracture in the right hip. Left hip appears grossly intact. The pelvic bones are without obvious fracture. IMPRESSION: No identified fractures. Mildly limited evaluation of the right hip due to internal rotation. Electronically Signed   By: Dorise Bullion III M.D   On: 03/21/2018 11:15   Dg Elbow Complete Left  Result Date: 03/21/2018 CLINICAL DATA:  Status post fall.  Left elbow pain. EXAM: LEFT ELBOW - COMPLETE 3+ VIEW COMPARISON:  None. FINDINGS: There is no evidence of fracture, dislocation, or joint effusion. There is no evidence of arthropathy or other focal bone abnormality. There is generalized osteopenia. There are mild enthesopathic changes at the common flexor tendon origin. Soft tissues are unremarkable. IMPRESSION: No acute osseous injury of the left elbow. Electronically Signed   By: Kathreen Devoid   On:  03/21/2018 11:12   Dg Elbow Complete Right  Result Date: 03/21/2018 CLINICAL DATA:  Status post fall. EXAM: RIGHT ELBOW - COMPLETE 3+ VIEW COMPARISON:  None. FINDINGS: There is no evidence of fracture, dislocation, or joint effusion. There is no evidence of arthropathy or other focal bone abnormality. There is generalized osteopenia mild enthesopathic changes at the common flexor tendon origin. Soft tissues are unremarkable. IMPRESSION: No acute osseous injury of the right elbow. Electronically Signed   By: Kathreen Devoid   On: 03/21/2018 11:11   Dg Knee Complete 4 Views Left  Result Date: 03/21/2018 CLINICAL DATA:  Pain following fall EXAM: LEFT KNEE - COMPLETE 4+ VIEW COMPARISON:  March 18, 2016 FINDINGS: Frontal, lateral, and bilateral oblique views were obtained. There is a bipartite patella, an anatomic variant. No evident fracture or dislocation. No joint effusion. Joint spaces appear unremarkable. There is slight chondrocalcinosis. No erosive change. IMPRESSION: Slight chondrocalcinosis, a finding that may be seen with osteoarthritis or calcium pyrophosphate deposition disease. No appreciable joint space narrowing or erosion. No fracture or joint effusion. Bipartite patella noted, an anatomic variant. Electronically Signed   By: Lowella Grip III M.D.   On: 03/21/2018 11:13   Dg Knee Complete 4 Views Right  Result Date: 03/21/2018 CLINICAL DATA:  Pain following fall EXAM: RIGHT KNEE - COMPLETE 4+ VIEW COMPARISON:  Right knee radiographs and right knee CT March 18, 2018 FINDINGS: Frontal, lateral, and bilateral oblique views were obtained. There is an apparent bipartite patella, stable. No acute fracture or dislocation. No joint effusion. There is slight narrowing medially and in the patellofemoral joint regions. There is chondrocalcinosis. No erosive change. IMPRESSION: No acute fracture or dislocation. No joint effusion. Bipartite patella, an anatomic variant. There is joint space  narrowing medially and in the patellofemoral joint regions, similar to prior study. Chondrocalcinosis present, a finding that may be due to osteoarthritis or calcium pyrophosphate deposition disease. Electronically Signed   By: Lowella Grip III M.D.   On: 03/21/2018 11:15    Scheduled Meds: . brimonidine  1 drop Both Eyes BID   And  . timolol  1 drop Both Eyes BID  . enoxaparin (LOVENOX) injection  40 mg Subcutaneous Q24H  . feeding supplement (ENSURE ENLIVE)  237 mL Oral TID BM  . latanoprost  1 drop Both Eyes QPM  . mouth rinse  15 mL Mouth Rinse BID  . midodrine  10 mg Oral TID WC  . multivitamin with minerals  1 tablet Oral Daily  . Netarsudil Dimesylate  1 drop Right Eye QPM   Continuous Infusions: . sodium chloride Stopped (  03/22/18 1654)  . potassium chloride 10 mEq (03/22/18 1654)     LOS: 0 days   Marylu Lund, MD [facility] Pager On Amion  If 7PM-7AM, please contact night-coverage 03/22/2018, 5:51 PM

## 2018-03-22 NOTE — Care Management Note (Signed)
Case Management Note  Patient Details  Name: Timothy Kim MRN: [id number] Date of Birth: [birth year]  Subjective/Objective: Admitted wWeakness. Hx: etoh. From home. Noted has dtr-POA [phone] patient agreed staff can talk to her.CSW cons.PT/ST cons-await recc.                   Action/Plan:dc plan home.   Expected Discharge Date:                  Expected Discharge Plan:     In-House Referral:     Discharge planning Services     Post Acute Care Choice:    Choice offered to:     DME Arranged:    DME Agency:     HH Arranged:    HH Agency:     Status of Service:     If discussed at [employer], dates discussed:    Additional Comments:  Dessa Phi, RN 03/22/2018, 12:36 PM

## 2018-03-22 NOTE — Care Management Obs Status (Signed)
[place] NOTIFICATION   Patient Details  Name: Timothy Kim MRN: [id number] Date of Birth: [birth year]   Medicare Observation Status Notification Given:  Yes    MahabirJuliann Pulse, RN 03/22/2018, 4:13 PM

## 2018-03-22 NOTE — Progress Notes (Signed)
Initial Nutrition Assessment  DOCUMENTATION CODES:   Underweight(Will assess for malnutrition at follow-up)  INTERVENTION:  - Will order Ensure Enlive TID, each supplement provides 350 kcal and 20 grams of protein. - Will order Magic Cup BID with meals, each supplement provides 290 kcal and 9 grams of protein - Will order daily multivitamin with minerals.    NUTRITION DIAGNOSIS:   Increased nutrient needs related to chronic illness, cancer and cancer related treatments as evidenced by estimated needs.  GOAL:   Patient will meet greater than or equal to 90% of their needs  MONITOR:   PO intake, Supplement acceptance, Weight trends, Labs  REASON FOR ASSESSMENT:   Malnutrition Screening Tool  ASSESSMENT:   84 y.o. 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 He also has hx of arthritis, syncope and recurrent falls, and chronic hypotension. Patient admitted s/p mechanical fall. He bruised his bilateral knees and his bilateral elbows. He never hit his head or had complete syncope. He reported otherwise being in fairly good health without any nausea, vomiting, abdominal pain, chest pain. He does report that he has chronically poor p.o. due to his significant head and neck surgery and radiation.  No intakes documented since admission. Patient was previously on a Regular diet but now downgraded to Dysphagia 1, thin liquids following SLP evaluation this afternoon. Patient sleeping when RD entered the room. He awoke after name call x3 but reported that he is feeling too weak to talk with RD and would prefer RD to visit at another time.   Spoke with RN who reports that patient consumed part of an ice cream cup and may have had some Ensure during swallow evaluation, but otherwise has not had anything PO today.   Per chart review, patient has lost 16 lb (12.6% body weight) in the  past 3 months. This is significant for time frame. Highly suspect patient meets criteria for some degree of malnutrition but unable to confirm without talking with patient and completing NFPE.   Medications reviewed; 4 g IV Mg sulfate x1 run today, 10 mEq IV KCl x6 runs today. Labs reviewed; K: 2.8 mmol/L, Cl: 88 mmol/L, Ca: 8.4 mg/dL, Mg: 1.4 mg/dL. IVF; NS @ 75 mL/hr.       NUTRITION - FOCUSED PHYSICAL EXAM:  Unable to complete; will attempt at follow-up.  Diet Order:   Diet Order            DIET - DYS 1 Room service appropriate? Yes; Fluid consistency: Thin  Diet effective now              EDUCATION NEEDS:   Not appropriate for education at this time  Skin:  Skin Assessment: Reviewed RN Assessment  Last BM:  PTA/unknown  Height:   Ht Readings from Last 1 Encounters:  03/21/18 5\' 7"  (1.702 m)    Weight:   Wt Readings from Last 1 Encounters:  03/21/18 50.3 kg    Ideal Body Weight:  67.27 kg  BMI:  Body mass index is 17.37 kg/m.  Estimated Nutritional Needs:   Kcal:  4742-5956  Protein:  75-100 grams (1.5-2 grams/kg)  Fluid:  >/= 1.8 L/day     Jarome Matin, MS, RD, LDN, [facility] Inpatient Clinical Dietitian Pager # [phone] After hours/weekend pager # [phone]

## 2018-03-22 NOTE — Care Management Note (Signed)
Case Management Note  Patient Details  Name: Timothy Kim MRN: [id number] Date of Birth: February 20, 1930  Subjective/Objective:  Per patient request-spoke to son [name] [phone] about d/c plans-he says patient @ home w/girlfriend [name]-has mental illness-unable to care for patient-patient was her caregiver. Says he cant go home.We can defer to sister [name] if [name] not available [phone]. Per attendng-patient in agreement to palliative cons-await recc.              Action/Plan:d/c plan SNF    Expected Discharge Date:                  Expected Discharge Plan:     In-House Referral:     Discharge planning Services     Post Acute Care Choice:    Choice offered to:     DME Arranged:    DME Agency:     HH Arranged:    HH Agency:     Status of Service:  In process, will continue to follow  If discussed at Long Length of Stay Meetings, dates discussed:    Additional Comments:  Dessa Phi, RN 03/22/2018, 4:14 PM

## 2018-03-22 NOTE — Progress Notes (Signed)
PT Cancellation Note  Patient Details Name: Timothy Kim MRN: [id number] DOB: 06-02-1930   Cancelled Treatment:    Reason Eval/Treat Not Completed: Medical issues which prohibited therapy Orthostatics obtained recently and RN reports pt with weakness. RN requests PT to check back as pt is to get K+ today.   Liandro Thelin,KATHrine E 03/22/2018, 11:40 AM Carmelia Bake, PT, DPT Acute Rehabilitation Services Office: [phone] Pager: [phone]

## 2018-03-22 NOTE — Evaluation (Signed)
Clinical/Bedside Swallow Evaluation Patient Details  Name: Timothy Kim MRN: [id number] Date of Birth: 02-May-1930  Today's Date: 03/22/2018 Time: SLP Start Time (ACUTE ONLY): 1250 SLP Stop Time (ACUTE ONLY): 1330 SLP Time Calculation (min) (ACUTE ONLY): 40 min  Past Medical History:  Past Medical History:  Diagnosis Date  . Alcohol abuse   . Allergic rhinitis   . Anemia in neoplastic disease    chronic  . Bilateral lower extremity edema    feet  . Borderline diabetes   . BPH (benign prostatic hypertrophy)   . CLL (chronic lymphocytic leukemia) [facility]) oncologist-  dr Heath Lark ([facility])   dx Jan 2014 --- Stage 1--  currently asymptomatic as of Apr 2016 and No active disease  . DDD (degenerative disc disease), cervical   . DDD (degenerative disc disease), lumbosacral   . 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 Dysphasia functional--  speech therapy   post op  radical neck dissection 07-21-2014--  changed diet to no chewy food , softer foods , states with the changes swallowed okay without any issues  . Glaucoma    both eyes  . History of gastroesophageal reflux (GERD)   . History of kidney stones    right side--  per CT from [facility] 01-17-2015 non-obstructing  . History of radiation therapy    09-04-2014 to 10-13-2014  Right retromolar region (tumor bed) and right neck/  60Gy in 30 fractions to tumor bed and 54Gy in 30 fractions to neck  . History of radiation to head and neck region 09-04-2014 to  10-13-2014   right retromolar region and right neck  60 Gy  . History of syncope    a. Event monitor 11/17: No evidence of significant AVB or > 3 second pause.; plan ILR if recurrent syncope in the future  . History of tracheostomy    post op radical neck dissection  07-21-2014--  post  op tracheostomy on 07-25-2014  decannulated and sutured 07-29-2014  . Hypertension   . Macular degeneration    right eye only  . MRSA (methicillin resistant staph aureus) culture positive   . OA (osteoarthritis)   . Organic impotence   . Pneumonia 12/2015  . Pulmonary nodule    benign  and stable per last CT  . Radiation-induced dermatitis   . Squamous cell carcinoma of retromolar trigone [facility]) oncologist-  dr Isidore Moos   Stage IVB, pT1b, pN0, Grade 2, +PNI, no LVSI---  S/P  RADICAL NECK DISSECTION AND RADIATION  . Thoracic ascending aortic aneurysm ([place]) 03/28/2016   Chest CTA 9/17: 4.6 cm fusiform ascending thoracic aortic aneurysm // Chest CTA 4/18: Ascending thoracic aortic aneurysm 4.2 cm >> repeat 1 year  . 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 Transitional cell carcinoma of bladder [facility]) urologist-- dr Junious Silk   High - grade  . Venous insufficiency    Past Surgical History:  Past Surgical History:  Procedure Laterality Date  . CATARACT EXTRACTION W/ INTRAOCULAR LENS  IMPLANT, BILATERAL    . COLONOSCOPY N/A 07/23/2016   Procedure: COLONOSCOPY;  Surgeon: Wilford Corner, MD;  Location: [facility] ENDOSCOPY;  Service: Endoscopy;  Laterality: N/A;  . CYSTOSCOPY N/A 02/02/2015   Procedure: CYSTOSCOPY, INSTILLATION OF MITOMYCIN C;  Surgeon: Lowella Bandy, MD;  Location: [place];  Service: Urology;  Laterality: N/A;  . CYSTOSCOPY WITH BIOPSY N/A 03/27/2015   Procedure: CYSTOSCOPY WITH BLADDER  BIOPSY WITH FULGERATION;  Surgeon: Festus Aloe, MD;  Location: [facility];  Service: Urology;  Laterality: N/A;  . ESOPHAGOGASTRODUODENOSCOPY N/A 07/23/2016   Procedure: ESOPHAGOGASTRODUODENOSCOPY (EGD);  Surgeon: Wilford Corner, MD;  Location: [facility] ENDOSCOPY;  Service: Endoscopy;  Laterality: N/A;  . INGUINAL HERNIA REPAIR Bilateral right 1988//  left 1970's  . LAMINECTOMY WITH POSTERIOR LATERAL ARTHRODESIS LEVEL 1   09-01-2003   L3 -5 LAMINECTOMY W/ DECOMPRESSION AND FUSION L4-5  . MAXILLECTOMY Right 07/21/14,   [facility]   Radical Neck Dissection, Maxillectomy, Right Marginal Mandibulectomy, dental extractions, Parascauplar fasciocutaeous Free flap reconstruction  . RETINAL DETACHMENT REPAIR W/ SCLERAL BUCKLE LE  11-03-2000  . ROTATOR CUFF REPAIR Right 12-11-2000  . tonsil biopsy Right 05/24/14  . TRANSURETHRAL RESECTION OF BLADDER TUMOR N/A 02/02/2015   Procedure: TRANSURETHRAL RESECTION OF BLADDER TUMOR (TURBT);  Surgeon: Lowella Bandy, MD;  Location: [facility];  Service: Urology;  Laterality: N/A;   HPI:  84 yo male with h/o ETOH usage adm to [facility] with generalized weakness.  PMH + for head and neck cancer s/p radical neck dissection 07/21/14 requiring tracheostomy tube 1/29-07/25/14, sutured 07/29/14,  XRT 09/04/14-10/13/14 to both primary tumor site (retromolar) and neck region.  Pt also with right maxillectomy, tonsillar cancer *right* Dec 2015, macular degeneration, hearing loss *right ear deafness, ETOH use, DDD neck and spine, CXR showed 14 mm right lower lung pulmonary nodule, smaller compared to prior ct 2016.  Pt underwent esopahgram 11/21/2016 showing coating of laryngeal vestibule, no aspiration, esophageal dysmotility and moderate hiatal hernia.  Pt admits his dysphagia is worsening and he has lost weight due to it.  Recommend nutrition consult to help maximzie liquid nutrition as pt states this is easiest to swallow.     Assessment / Plan / Recommendation Clinical Impression  Pt presents with s/s of oropharyngeal dysphagia due to h/o head and neck cancer s/p radical neck dissection and XRT.  Pt is very aware of his dysphagia and manages as best as able.  He denies having any pulmonary issues, admits to weight loss due to his dysphagia.  He denies requiring heimlich manuever and states he takes his medications at home with orange juice *they are very small.  He verbalizes he is taking larger pills  here, thus they are being crushed.  Significant anatomical changes to palate, mandible/maxilla noted.  Few teeth on left side of mouth is what pt uses to "cut up to food a little" but he admits he can not masticate.    Liquids are best tolerated by this pt per his statement.  Positioning in bed is also an issue due to pt's chronic back, neck pain - however with encouragement, he was able to sit upright.  Observed pt consume sprite, Orange juice, Ensure and icecream.  Suspected oral transiting delay noted with throat clearing both immediately and delayed post-swallow.   Given laryngeal coating on esophagram in 11/2016 - suspect chronic laryngeal penetration and some aspiration for which pt is managing at this time.  Pt reports he drinks Ensure daily at home.     As pt states he CANNOT masticate foods - will modify diet to puree/thin per his request.  If he desires advancement - would honor his wishes given his awareness of his dysphagia and self management.  Set up oral suction for pt to use after meals and demonstrated  its use to pt.  Of note, pt states his goals are to stop hurting and to "go peacefully".  Given chronicity of pt's medical issues, recommend consider a palliative consult to help establish his goals, especially in light of malnutrition/weight loss due partially to his dysphagia.    Will follow up.  Thanks.    SLP Visit Diagnosis: Dysphagia, oropharyngeal phase (R13.12)    Aspiration Risk  Moderate aspiration risk    Diet Recommendation Dysphagia 1 (Puree);Thin liquid(per pt wishes, would recommend dys3/thin if pt desires advancement)   Liquid Administration via: Straw Medication Administration: Crushed with puree(crush and place medicine on spoon with icecream) Supervision: Patient able to self feed Compensations: Slow rate;Small sips/bites(sit fully upright as able, oral suction after meals) Postural Changes: Seated upright at 90 degrees;Remain upright for at least 30 minutes after  po intake    Other  Recommendations Oral Care Recommendations: Oral care before and after PO(oral suction after)   Follow up Recommendations    TBD    Frequency and Duration min 1 x/week  1 week       Prognosis Prognosis for Safe Diet Advancement: Fair Barriers to Reach Goals: Severity of deficits;Time post onset      Swallow Study   General Date of Onset: 03/22/18 HPI: 84 yo male with h/o ETOH usage adm to [facility] with generalized weakness.  PMH + for head and neck cancer s/p radical neck dissection 07/21/14 requiring tracheostomy tube 1/29-07/25/14, sutured 07/29/14,  XRT 09/04/14-10/13/14 to both primary tumor site (retromolar) and neck region.  Pt also with right maxillectomy, tonsillar cancer *right* Dec 2015, macular degeneration, hearing loss *right ear deafness, ETOH use, DDD neck and spine, CXR showed 14 mm right lower lung pulmonary nodule, smaller compared to prior ct 2016.  Pt underwent esopahgram 11/21/2016 showing coating of laryngeal vestibule, no aspiration, esophageal dysmotility and moderate hiatal hernia.  Pt admits his dysphagia is worsening and he has lost weight due to it.  Recommend nutrition consult to help maximzie liquid nutrition as pt states this is easiest to swallow.   Type of Study: Bedside Swallow Evaluation Previous Swallow Assessment: esophagram and clinical follow up with Glendell Docker at [facility] 08/13/2015 Diet Prior to this Study: Regular;Thin liquids Temperature Spikes Noted: No Respiratory Status: Room air History of Recent Intubation: No Behavior/Cognition: Alert;Cooperative;Pleasant mood Oral Cavity Assessment: Other (comment)(significant anatomical changes from surgery) Oral Cavity - Dentition: Other (Comment)(few teeth on left side of mouth) Vision: Functional for self-feeding Self-Feeding Abilities: Able to feed self Patient Positioning: Upright in bed Baseline Vocal Quality: Normal Volitional Cough: Strong Volitional Swallow: (dnt)    Oral/Motor/Sensory Function  Overall Oral Motor/Sensory Function: Moderate impairment(due to surgical changes- decreased velar closure, decreased labial closure) Facial ROM: Reduced right Facial Symmetry: Abnormal symmetry right Facial Strength: Reduced right Facial Sensation: Reduced right Velum: Other (comment)(impaired) Mandible: Impaired   Ice Chips Ice chips: Not tested   Thin Liquid Thin Liquid: Impaired Presentation: Self Fed;Straw Pharyngeal  Phase Impairments: Throat Clearing - Immediate;Throat Clearing - Delayed    Nectar Thick Nectar Thick Liquid: Impaired Presentation: Straw;Self Fed Pharyngeal Phase Impairments: Throat Clearing - Delayed;Throat Clearing - Immediate   Honey Thick Honey Thick Liquid: Not tested   Puree Puree: Impaired(icecream) Presentation: Spoon;Self Fed Oral Phase Functional Implications: Prolonged oral transit Pharyngeal Phase Impairments: Throat Clearing - Delayed   Solid     Solid: Not tested      Macario Golds 03/22/2018,2:18 PM  Luanna Salk, MS [place] Pager [phone] Office  [phone]    

## 2018-03-23 DIAGNOSIS — C9110 Chronic lymphocytic leukemia of B-cell type not having achieved remission: Secondary | ICD-10-CM

## 2018-03-23 LAB — MAGNESIUM: MAGNESIUM: 2.2 mg/dL (ref 1.7–2.4)

## 2018-03-23 LAB — BASIC METABOLIC PANEL
Anion gap: 8 (ref 5–15)
BUN: 13 mg/dL (ref 8–23)
CALCIUM: 8.3 mg/dL — AB (ref 8.9–10.3)
CO2: 33 mmol/L — ABNORMAL HIGH (ref 22–32)
CREATININE: 0.6 mg/dL — AB (ref 0.61–1.24)
Chloride: 94 mmol/L — ABNORMAL LOW (ref 98–111)
Glucose, Bld: 101 mg/dL — ABNORMAL HIGH (ref 70–99)
Potassium: 3.2 mmol/L — ABNORMAL LOW (ref 3.5–5.1)
SODIUM: 135 mmol/L (ref 135–145)

## 2018-03-23 MED ORDER — POTASSIUM CHLORIDE 10 MEQ/100ML IV SOLN
INTRAVENOUS | Status: AC
  Administered 2018-03-23: 10 meq
  Filled 2018-03-23: qty 100

## 2018-03-23 MED ORDER — POTASSIUM CHLORIDE 10 MEQ/100ML IV SOLN
10.0000 meq | INTRAVENOUS | Status: AC
  Administered 2018-03-23 (×6): 10 meq via INTRAVENOUS
  Filled 2018-03-23 (×5): qty 100

## 2018-03-23 MED ORDER — SODIUM CHLORIDE 0.9 % IV SOLN
INTRAVENOUS | Status: DC
  Administered 2018-03-23 – 2018-03-24 (×3): via INTRAVENOUS

## 2018-03-23 NOTE — Progress Notes (Signed)
PROGRESS NOTE    Timothy Kim  PBD:[id number] DOB: 1929-10-29 DOA: 03/21/2018 PCP: Marletta Lor, MD    Brief Narrative:  84 y.o. 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  Patient reports that he got up from bed and went to use the restroom with his walker and then abruptly fell.  He is not sure if he had a syncopal episode or mechanical fall as he is a history of both however he reports he feels like it was most likely a mechanical fall.  He fell and bruised his bilateral knees and his bilateral elbows.  He never hit his head or had complete syncope.  He denies any chest pain, palpitations, orthopnea, lower extremity edema, shortness of breath.  He reports otherwise being in fairly good health without any nausea, vomiting, abdominal pain, chest pain.  He does report that he has chronically poor p.o. due to his significant head and neck surgery and radiation.  He apparently has a history of syncope approximately 2 years ago with a plan to consider implantation of a loop recorder.  He is also noted to have a history of hypertension that is now progressed to chronic hypotension and significant orthostasis with this requiring midodrine.  He reports that he does attempt to use compensatory mechanisms when changing positions including the walker and standing for several minutes.  ED Course: In the ED patient's vitals were stable.  Labs are notable for mild hypokalemia 3.1, lactate of 3.46, sodium 133, white blood cell count of 6.9, normal hemoglobin and hematocrit.  X-ray of bilateral elbows knees and pelvis showed no acute fracture but did show contusions.  Patient was given IV fluids and potassium supplementation and called for admission  due to elevated lactate and concern for dehydration.   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)   Weakness generalized   Thoracic ascending aortic aneurysm (HCC)   Drug-induced hypotension   Fall at home, initial encounter   Dehydration   Fall  #) Fall/possible syncope:  -Patient reports long hx of poor po intake, likely related to hx of squamous cell head and neck CA -Improved but still clinically dehydrated with poor skin turgor and dry mucus membranes -Continue on IVF hydration -Orthostatics noted to be negative -Will continue with gentle IVF hydration -PT consulted with recommendations for SNF, SW consulted  #) Chronic hypotension: - Continue midodrine 10 mg 3 times daily - Presently stable  #) Chronic grade 1 diastolic heart failure: -Clinically dehydrated per above -Lasix remains on hold  #) Squamous cell carcinoma head and neck: At this time this appears to be in remission -Stable at present -Currenlty stable  #) CLL: Appears stable at this time   DVT prophylaxis: Lovenox subQ Code Status: DNR Family Communication: Pt in room, family not at bedside Disposition Plan: Uncertain at this time  Consultants:     Procedures:     Antimicrobials: Anti-infectives (From admission, onward)   None      Subjective: States feeling better. Tolerating liquids  Objective: Vitals:   03/22/18 2021 03/22/18 2028 03/23/18 0454 03/23/18 1419  BP: 105/64  109/69 (!) 90/58  Pulse: 63  71 (!) 58 67  Resp: 16  15 16   Temp: 97.9 F (36.6 C)  (!) 97.5 F (36.4 C) 97.7 F (36.5 C)  TempSrc: Oral  Oral Oral  SpO2: (!) 66% 94% 100% 99%  Weight:      Height:        Intake/Output Summary (Last 24 hours) at 03/23/2018 1512 Last data filed at 03/23/2018 1419 Gross per 24 hour  Intake 1902.5 ml  Output 375 ml  Net  1527.5 ml   Filed Weights   03/21/18 1618  Weight: 50.3 kg    Examination: General exam: Awake, laying in bed, in nad, dry mucus membranes Respiratory system: Normal respiratory effort, no wheezing Cardiovascular system: regular rate, s1, s2 Gastrointestinal system: Soft, nondistended, positive BS Central nervous system: CN2-12 grossly intact, strength intact Extremities: Perfused, no clubbing Skin: poor skin turgor, no notable skin lesions seen Psychiatry: Mood normal // no visual hallucinations   Data Reviewed: I have personally reviewed following labs and imaging studies  CBC: Recent Labs  Lab 03/21/18 1018 03/22/18 0525  WBC 6.9 8.4  NEUTROABS 3.7  --   HGB 13.2 11.7*  HCT 36.7* 33.9*  MCV 101.4* 104.3*  PLT 211 093   Basic Metabolic Panel: Recent Labs  Lab 03/21/18 0851 03/22/18 0525 03/23/18 0544  NA 133* 138 135  K 3.1* 2.8* 3.2*  CL 74* 88* 94*  CO2 39* 40* 33*  GLUCOSE 109* 96 101*  BUN 19 17 13   CREATININE 0.83 0.69 0.60*  CALCIUM 9.6 8.4* 8.3*  MG  --  1.4* 2.2   GFR: Estimated Creatinine Clearance: 48 mL/min (A) (by C-G formula based on SCr of 0.6 mg/dL (L)). Liver Function Tests: Recent Labs  Lab 03/21/18 0851  AST 31  ALT 11  ALKPHOS 84  BILITOT 1.4*  PROT 6.7  ALBUMIN 3.3*   No results for input(s): LIPASE, AMYLASE in the last 168 hours. No results for input(s): AMMONIA in the last 168 hours. Coagulation Profile: No results for input(s): INR, PROTIME in the last 168 hours. Cardiac Enzymes: No results for input(s): CKTOTAL, CKMB, CKMBINDEX, TROPONINI in the last 168 hours. BNP (last 3 results) No results for input(s): PROBNP in the last 8760 hours. HbA1C: No results for input(s): HGBA1C in the last 72 hours. CBG: Recent Labs  Lab 03/21/18 0908  GLUCAP 98   Lipid Profile: No results for input(s): CHOL, HDL, LDLCALC, TRIG, CHOLHDL, LDLDIRECT in the last 72 hours. Thyroid Function Tests: No results for input(s): TSH, T4TOTAL,  FREET4, T3FREE, THYROIDAB in the last 72 hours. Anemia Panel: No results for input(s): VITAMINB12, FOLATE, FERRITIN, TIBC, IRON, RETICCTPCT in the last 72 hours. Sepsis Labs: Recent Labs  Lab 03/21/18 1025 03/21/18 1358  LATICACIDVEN 3.46* 2.61*    No results found for this or any previous visit (from the past 240 hour(s)).   Radiology Studies: No results found.  Scheduled Meds: . brimonidine  1 drop Both Eyes BID   And  . timolol  1 drop Both Eyes BID  . enoxaparin (LOVENOX) injection  40 mg Subcutaneous Q24H  . feeding supplement (ENSURE ENLIVE)  237 mL Oral TID BM  . latanoprost  1 drop Both Eyes QPM  . mouth rinse  15 mL Mouth Rinse BID  . midodrine  10 mg Oral TID WC  . multivitamin with minerals  1 tablet Oral Daily  . Netarsudil Dimesylate  1 drop Right Eye QPM   Continuous Infusions: . sodium chloride Stopped (03/23/18 1235)  . sodium  chloride 75 mL/hr at 03/23/18 1300     LOS: 1 day   Marylu Lund, MD [facility] Pager On Amion  If 7PM-7AM, please contact night-coverage 03/23/2018, 3:12 PM

## 2018-03-23 NOTE — Evaluation (Signed)
Physical Therapy Evaluation Patient Details Name: Timothy Kim MRN: [id number] DOB: 03-21-1930 Today's Date: 03/23/2018   History of Present Illness  84 yo male admitted with syncope, fall, weakness. Hx of CLL, throat ca, OA, FTT, syncope, falls, hypotension, macular degeneration  Clinical Impression  On eval, pt required Min assist for mobility. He was only able to tolerate a stand pivot, bed to recliner. Mobility limited by dizziness. BP at rest in supine: 84/63; after sitting in recliner: 97/71. At this time, recommendation is for SNF if pt will agree (he may refuse placement). Will follow and progress activity as tolerated.    Follow Up Recommendations SNF(if pt will agree. If he refuses placement, then HHPT and 24/7 care)    Equipment Recommendations  None recommended by PT    Recommendations for Other Services       Precautions / Restrictions Precautions Precautions: Fall Restrictions Weight Bearing Restrictions: No      Mobility  Bed Mobility Overal bed mobility: Needs Assistance Bed Mobility: Supine to Sit     Supine to sit: Min guard;HOB elevated     General bed mobility comments: BP 84/63 prior to mobilizing.   Transfers Overall transfer level: Needs assistance Equipment used: Rolling walker (2 wheeled) Transfers: Sit to/from Stand Sit to Stand: Min assist         General transfer comment: Assist to rise, stabilize, control descent. VCs safety, hand placement. Stand pivot, bed to recliner, with RW. Pt c/o dizziness and feeling as if he would fall forward. BP 97/71 once seated in recliner  Ambulation/Gait             General Gait Details: NT-due to dizziness  Stairs            Wheelchair Mobility    Modified Rankin (Stroke Patients Only)       Balance Overall balance assessment: Needs assistance;History of Falls         Standing balance support: Bilateral upper extremity supported Standing balance-Leahy Scale: Poor                                Pertinent Vitals/Pain Pain Assessment: No/denies pain    Home Living Family/patient expects to be discharged to:: Private residence Living Arrangements: Spouse/significant other Available Help at Discharge: Family Type of Home: House Home Access: Stairs to enter Entrance Stairs-Rails: None Entrance Stairs-Number of Steps: 1+1 Home Layout: One level Home Equipment: Environmental consultant - 2 wheels;Cane - single point      Prior Function Level of Independence: Independent with assistive device(s)         Comments: using walker for ambulation     Hand Dominance        Extremity/Trunk Assessment   Upper Extremity Assessment Upper Extremity Assessment: Generalized weakness    Lower Extremity Assessment Lower Extremity Assessment: Generalized weakness    Cervical / Trunk Assessment Cervical / Trunk Assessment: Kyphotic  Communication   Communication: No difficulties  Cognition Arousal/Alertness: Awake/alert Behavior During Therapy: WFL for tasks assessed/performed Overall Cognitive Status: Within Functional Limits for tasks assessed                                        General Comments      Exercises     Assessment/Plan    PT Assessment Patient needs continued PT services  PT Problem List  Decreased strength;Decreased mobility;Decreased activity tolerance;Decreased balance;Decreased knowledge of use of DME       PT Treatment Interventions DME instruction;Gait training;Functional mobility training;Therapeutic activities;Balance training;Patient/family education;Therapeutic exercise    PT Goals (Current goals can be found in the Care Plan section)  Acute Rehab PT Goals Patient Stated Goal: none stated PT Goal Formulation: With patient Time For Goal Achievement: 04/06/18 Potential to Achieve Goals: Fair    Frequency Min 3X/week   Barriers to discharge        Co-evaluation               AM-PAC PT "6  Clicks" Daily Activity  Outcome Measure Difficulty turning over in bed (including adjusting bedclothes, sheets and blankets)?: A Little Difficulty moving from lying on back to sitting on the side of the bed? : A Little Difficulty sitting down on and standing up from a chair with arms (e.g., wheelchair, bedside commode, etc,.)?: Unable Help needed moving to and from a bed to chair (including a wheelchair)?: A Little Help needed walking in hospital room?: A Little Help needed climbing 3-5 steps with a railing? : A Lot 6 Click Score: 15    End of Session Equipment Utilized During Treatment: Gait belt Activity Tolerance: (limited by dizziness) Patient left: in chair;with call bell/phone within reach;with chair alarm set   PT Visit Diagnosis: Muscle weakness (generalized) (M62.81);Unsteadiness on feet (R26.81);Difficulty in walking, not elsewhere classified (R26.2);Dizziness and giddiness (R42)    Time: 8372-9021 PT Time Calculation (min) (ACUTE ONLY): 24 min   Charges:   PT Evaluation $PT Eval Moderate Complexity: 1 Mod PT Treatments $Therapeutic Activity: 8-22 mins         Weston Anna, PT Acute Rehabilitation Services Pager: [phone] Office: [phone]

## 2018-03-24 DIAGNOSIS — R627 Adult failure to thrive: Secondary | ICD-10-CM | POA: Diagnosis present

## 2018-03-24 DIAGNOSIS — R638 Other symptoms and signs concerning food and fluid intake: Secondary | ICD-10-CM

## 2018-03-24 DIAGNOSIS — Z515 Encounter for palliative care: Secondary | ICD-10-CM

## 2018-03-24 DIAGNOSIS — E43 Unspecified severe protein-calorie malnutrition: Secondary | ICD-10-CM | POA: Diagnosis present

## 2018-03-24 DIAGNOSIS — I712 Thoracic aortic aneurysm, without rupture: Secondary | ICD-10-CM

## 2018-03-24 DIAGNOSIS — E876 Hypokalemia: Secondary | ICD-10-CM | POA: Diagnosis present

## 2018-03-24 DIAGNOSIS — R531 Weakness: Secondary | ICD-10-CM

## 2018-03-24 DIAGNOSIS — Z7189 Other specified counseling: Secondary | ICD-10-CM

## 2018-03-24 DIAGNOSIS — C062 Malignant neoplasm of retromolar area: Secondary | ICD-10-CM

## 2018-03-24 LAB — BASIC METABOLIC PANEL
Anion gap: 6 (ref 5–15)
BUN: 13 mg/dL (ref 8–23)
CHLORIDE: 100 mmol/L (ref 98–111)
CO2: 31 mmol/L (ref 22–32)
Calcium: 8.4 mg/dL — ABNORMAL LOW (ref 8.9–10.3)
Creatinine, Ser: 0.61 mg/dL (ref 0.61–1.24)
GFR calc Af Amer: >60 mL/min (ref >60)
GLUCOSE: 93 mg/dL (ref 70–99)
Potassium: 4.2 mmol/L (ref 3.5–5.1)
Sodium: 137 mmol/L (ref 135–145)

## 2018-03-24 NOTE — Progress Notes (Signed)
Physical Therapy Treatment Patient Details Name: Timothy Kim MRN: [id number] DOB: 1929/08/24 Today's Date: 03/24/2018    History of Present Illness 84 yo male admitted with syncope, fall, weakness. Hx of CLL, throat ca, OA, FTT, syncope, falls, hypotension, macular degeneration    PT Comments    Pt very deconditioned and weak.  Required + 2 assisted to get OOB to [facility].  Required + 2 assist to briefly stand.  Pt unable to support his own weight, present with B flex hips and knees.  Pt reports a "bad" R shoulder.  Unable to amb assisted with transfers only from bed to [facility] to recliner all 1/4 pivot.  Pt will need ST Rehab at SNF prior to returning home.    Follow Up Recommendations  SNF     Equipment Recommendations  None recommended by PT    Recommendations for Other Services       Precautions / Restrictions Precautions Precautions: Fall Precaution Comments: 2 year Hx dizziness/HOH/Bad R shoulder Restrictions Weight Bearing Restrictions: No    Mobility  Bed Mobility Overal bed mobility: Needs Assistance Bed Mobility: Supine to Sit     Supine to sit: Mod assist     General bed mobility comments: extra assist to scoot to EOB using bed pad  Transfers Overall transfer level: Needs assistance Equipment used: Rolling walker (2 wheeled);None Transfers: Sit to/from [facility] Sit to Stand: Max assist;Total assist;+2 physical assistance;+2 safety/equipment Stand pivot transfers: Total assist;+2 physical assistance;+2 safety/equipment       General transfer comment: required increased assist with increased fear of falling/anxiety.  Required + 2 assist.  Assisted from elevated bed to [facility] for BM then attenpted sit to stand however pt unable to come to a complete stance due to B knees buckling and pt in a near panic he was going to fall.  So performed "bear hug" 1/4 pivot from [facility] to recliner.    Ambulation/Gait             General Gait Details: unable  due to poor transfer ability   Stairs             Wheelchair Mobility    Modified Rankin (Stroke Patients Only)       Balance                                            Cognition Arousal/Alertness: Awake/alert Behavior During Therapy: WFL for tasks assessed/performed Overall Cognitive Status: Within Functional Limits for tasks assessed                                        Exercises      General Comments        Pertinent Vitals/Pain Pain Assessment: Faces Faces Pain Scale: Hurts even more Pain Location: with movement, back and neck Pain Descriptors / Indicators: Moaning Pain Intervention(s): Monitored during session;Repositioned    Home Living                      Prior Function            PT Goals (current goals can now be found in the care plan section) Progress towards PT goals: Progressing toward goals    Frequency    Min 3X/week  PT Plan Current plan remains appropriate    Co-evaluation              AM-PAC PT "6 Clicks" Daily Activity  Outcome Measure  Difficulty turning over in bed (including adjusting bedclothes, sheets and blankets)?: A Lot Difficulty moving from lying on back to sitting on the side of the bed? : A Lot Difficulty sitting down on and standing up from a chair with arms (e.g., wheelchair, bedside commode, etc,.)?: A Lot Help needed moving to and from a bed to chair (including a wheelchair)?: A Lot Help needed walking in hospital room?: A Lot Help needed climbing 3-5 steps with a railing? : Total 6 Click Score: 11    End of Session Equipment Utilized During Treatment: Gait belt Activity Tolerance: Other (comment);Patient limited by fatigue(anxiety/fear of falling) Patient left: in chair;with call bell/phone within reach;with chair alarm set Nurse Communication: Mobility status(+ 2 assist for transfers and pt had a BM) PT Visit Diagnosis: Muscle weakness  (generalized) (M62.81);Unsteadiness on feet (R26.81);Difficulty in walking, not elsewhere classified (R26.2);Dizziness and giddiness (R42)     Time: 9604-5409 PT Time Calculation (min) (ACUTE ONLY): 32 min  Charges:  $Therapeutic Activity: 23-37 mins                     Rica Koyanagi  PTA Acute  Rehabilitation Services Pager      [phone] Office      [phone]

## 2018-03-24 NOTE — NC FL2 (Signed)
[place] LEVEL OF CARE SCREENING TOOL     IDENTIFICATION  Patient Name: Timothy Kim Birthdate: 10/28/1929 Sex: male Admission Date (Current Location): 03/21/2018  [facility] and [place] Number:  Herbalist and Address:  [facility],  [place], [place]      Provider Number: [id number]  Attending Physician Name and Address:  Paticia Stack, MD  Relative Name and Phone Number:  Donnetta Simpers [phone]    Current Level of Care: Hospital Recommended Level of Care: [place] Prior Approval Number:    Date Approved/Denied:   PASRR Number: [id number]  Discharge Plan: SNF    Current Diagnoses: Patient Active Problem List   Diagnosis Date Noted  . Severe protein-calorie malnutrition ([place]), BMI 17 03/24/2018  . Decreased oral intake 03/24/2018  . Failure to thrive in adult 03/24/2018  . Encounter for palliative care   . Goals of care, counseling/discussion   . Fall at home, initial encounter 03/21/2018  . Dehydration 03/21/2018  . Drug-induced hypotension 10/20/2017  . Laceration of multiple sites of skin 10/20/2017  . Dysphagia 10/16/2016  . Abdominal pain, generalized 07/23/2016  . Bilateral leg edema 03/28/2016  . Thoracic ascending aortic aneurysm ([place]) 03/28/2016  . Syncope 03/19/2016  . Weakness generalized 03/19/2016  . Macrocytic anemia 03/19/2016  . Pneumonia 03/18/2016  . Drug-induced hypokalemia 10/16/2015  . History of bladder cancer 02/08/2015  . Anemia in neoplastic disease 10/17/2014  . 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 Abnormal weight loss 05/31/2014  . Cancer of the lip, oral cavity, and pharynx ([place]) 05/30/2014  . CLL (chronic lymphocytic leukemia) ([place]) 07/07/2012  . ALLERGIC RHINITIS 02/09/2008  . 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 Osteoarthritis 06/09/2007  . LOW BACK PAIN 06/09/2007  . Diabetes mellitus  without complication ([place]) 31/54/0086  . Essential hypertension 03/03/2007  . GERD 03/03/2007  . BPH (benign prostatic hyperplasia) 03/03/2007    Orientation RESPIRATION BLADDER Height & Weight     Self, Time, Situation, Place  Normal Continent Weight: 110 lb 14.3 oz (50.3 kg) Height:  5\' 7"  (170.2 cm)  BEHAVIORAL SYMPTOMS/MOOD NEUROLOGICAL BOWEL NUTRITION STATUS        Diet(DYS 1)  AMBULATORY STATUS COMMUNICATION OF NEEDS Skin   Extensive Assist Verbally Normal                       Personal Care Assistance Level of Assistance  Bathing, Feeding, Dressing Bathing Assistance: Maximum assistance Feeding assistance: Independent Dressing Assistance: Maximum assistance     Functional Limitations Info  Sight, Hearing, Speech Sight Info: Impaired Hearing Info: Impaired Speech Info: Adequate    SPECIAL CARE FACTORS FREQUENCY  PT (By licensed PT), OT (By licensed OT)     PT Frequency: 5x/week OT Frequency: 5x/week            Contractures Contractures Info: Not present    Additional Factors Info  Code Status, Allergies Code Status Info: DNR Allergies Info: NKA           Current Medications (03/24/2018):  This is the current hospital active medication list Current Facility-Administered Medications  Medication Dose Route Frequency Provider Last Rate Last Dose  . 0.9 %  sodium chloride infusion   Intravenous PRN Donne Hazel, MD 10 mL/hr at 03/23/18 1500    . 0.9 %  sodium chloride infusion   Intravenous Continuous Donne Hazel, MD 75 mL/hr at 03/24/18 1201    .  acetaminophen (TYLENOL) tablet 650 mg  650 mg Oral Q6H PRN Purohit, Shrey C, MD       Or  . acetaminophen (TYLENOL) suppository 650 mg  650 mg Rectal Q6H PRN Purohit, Shrey C, MD      . brimonidine (ALPHAGAN) 0.2 % ophthalmic solution 1 drop  1 drop Both Eyes BID Purohit, Shrey C, MD   1 drop at 03/24/18 0853   And  . timolol (TIMOPTIC) 0.5 % ophthalmic solution 1 drop  1 drop Both Eyes BID Purohit,  Shrey C, MD   1 drop at 03/24/18 0852  . enoxaparin (LOVENOX) injection 40 mg  40 mg Subcutaneous Q24H Purohit, Shrey C, MD   40 mg at 03/23/18 2300  . feeding supplement (ENSURE ENLIVE) (ENSURE ENLIVE) liquid 237 mL  237 mL Oral TID BM Donne Hazel, MD   237 mL at 03/24/18 1213  . ibuprofen (ADVIL,MOTRIN) tablet 600 mg  600 mg Oral Q6H PRN Purohit, Shrey C, MD      . latanoprost (XALATAN) 0.005 % ophthalmic solution 1 drop  1 drop Both Eyes QPM Purohit, Shrey C, MD   1 drop at 03/23/18 1737  . MEDLINE mouth rinse  15 mL Mouth Rinse BID Purohit, Shrey C, MD   15 mL at 03/24/18 0853  . midodrine (PROAMATINE) tablet 10 mg  10 mg Oral TID WC Purohit, Shrey C, MD   10 mg at 03/24/18 1156  . multivitamin with minerals tablet 1 tablet  1 tablet Oral Daily Donne Hazel, MD   1 tablet at 03/24/18 [phone]  . Netarsudil Dimesylate 0.02 % SOLN 1 drop  1 drop Right Eye QPM Purohit, Shrey C, MD      . ondansetron (ZOFRAN) tablet 4 mg  4 mg Oral Q6H PRN Purohit, Shrey C, MD       Or  . ondansetron (ZOFRAN) injection 4 mg  4 mg Intravenous Q6H PRN Purohit, Shrey C, MD      . polyethylene glycol (MIRALAX / GLYCOLAX) packet 17 g  17 g Oral Daily PRN Purohit, Konrad Dolores, MD   17 g at 03/23/18 1057     Discharge Medications: Please see discharge summary for a list of discharge medications.  Relevant Imaging Results:  Relevant Lab Results:   Additional Information SSN [id number]  Burnis Medin, LCSW

## 2018-03-24 NOTE — Clinical Social Work Note (Signed)
Clinical Social Work Assessment  Patient Details  Name: Timothy Kim MRN: [id number] Date of Birth: 1930/01/04  Date of referral:  03/24/18               Reason for consult:  Facility Placement                Permission sought to share information with:  Facility Sport and exercise psychologist, Family Supports Permission granted to share information::  Yes, Verbal Permission Granted  Name::     Donnetta Simpers  Agency::     Relationship::  daughter  Contact Information:  [phone]  Housing/Transportation Living arrangements for the past 2 months:  Single Family Home Source of Information:  Patient, Adult Children Patient Interpreter Needed:  None Criminal Activity/Legal Involvement Pertinent to Current Situation/Hospitalization:  No - Comment as needed Significant Relationships:  Adult Children Lives with:  Self Do you feel safe going back to the place where you live?  (PT recommending SNF) Need for family participation in patient care:  No (Coment)  Care giving concerns:  Patient from home alone. Patient uses a walker and cane at home. PT recommending SNF.  Social Worker assessment / plan:  CSW spoke with patient at bedside regarding PT recommendation for SNF and discharge planning. CSW explained SNF placement process, patient reported that he is agreeable to going to SNF. Patient reported that he prefers [facility]. Patient granted CSW verbal permission to speak with his daughter to discuss discharge planning further.  CSW contacted patient's daughter Donnetta Simpers) and discussed PT recommendation for SNF and discharge planning. Patient's daughter reported that she is in agreement with patient discharging to SNF for ST rehab. Patient's daughter reported that she will transport patient to SNF.  CSW will complete patient's FL2 and follow up with [facility] to see if they are able to offer patient a bed.  CSW will continue to follow and assist with discharge  planning.  Employment status:  Retired Health visitor, Managed Care PT Recommendations:  [place] / Referral to community resources:  [place]  Patient/Family's Response to care:  Patient appreciative of CSW assistance with discharge planning.  Patient/Family's Understanding of and Emotional Response to Diagnosis, Current Treatment, and Prognosis:  Patient presented calm and verbalized understanding of PT recommendation for SNF. Patient reported "I am not okay with going to SNF but I'm agreeable because they said I needed it". CSW validated patient's feelings and positively affirmed patient's safe discharge plan to SNF.  Emotional Assessment Appearance:  Appears stated age Attitude/Demeanor/Rapport:  Other(Cooperative) Affect (typically observed):  Appropriate, Calm Orientation:  Oriented to Self, Oriented to Situation, Oriented to Place, Oriented to  Time Alcohol / Substance use:  Not Applicable Psych involvement (Current and /or in the community):  No (Comment)  Discharge Needs  Concerns to be addressed:  Care Coordination Readmission within the last 30 days:  No Current discharge risk:  Lives alone Barriers to Discharge:  Continued Medical Work up   [employer], LCSW 03/24/2018, 1:57 PM

## 2018-03-24 NOTE — Consult Note (Signed)
Consultation Note Date: 03/24/2018   Patient Name: Timothy Kim  DOB: [birth year]  MRN: [id number]  Age / Sex: 84 y.o., male  PCP: Marletta Lor, MD Referring Physician: Paticia Stack, MD  Reason for Consultation: Establishing goals of care  HPI/Patient Profile: 84 y.o. 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 He has chronically poor p.o. due to his significant head and neck surgery and radiation. He has continued to decline and has become less mobile for the past few months.   A palliative consult has been requested for goals of care discussions.   The patient is a pleasant gentleman resting in bed. He is watching TV. I introduced myself and palliative care as follows:  Palliative medicine is specialized medical care for people living with serious illness. It focuses on providing relief from the symptoms and stress of a serious illness. The goal is to improve quality of life for both the patient and the family.      The patient states that his wishes are for him to be able to walk, be out of bed, to be able to drive. He goes on to say that he doesn't see that in his future. He talks about having had 12 different medical conditions, including 4 different types of cancers in his lifetime. He states, "I'm at a cross roads as far as my health is concerned."  We discussed about his goals of care overall, as well as most optimal disposition options. Reviewed his current conditions with him in  detail, all of his questions addressed to the best of my ability.   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 He has 2 children, lives with his companion of 21 years, Timothy Kim. He describes himself as a news junkie, that he watches various news channels all day.   Please note additional discussions and summary of recommendations as noted below.   NEXT OF KIN  1. Has a significant other/companion of 35 years Timothy Kim, patient states that Timothy Kim fell and is currently going to be admitted to the hospital, she has also recently completed a rehab stay at [facility] facility.  2. Has a son and daughter. Daughter lives in [place], [place] and son lives in [place].   SUMMARY OF RECOMMENDATIONS   Agree with DNR Recommend SNF rehab with palliative services to follow on discharge. Patient is now agreeable.  Ongoing discussions with patient regarding his overall health, his disease trajectory.  Thank you for the consult.   Code Status/Advance Care Planning:  DNR    Symptom Management:  continue current mode of care.   Palliative Prophylaxis:   Delirium Protocol   Psycho-social/Spiritual:   Desire for further Chaplaincy support:yes  Additional Recommendations: Caregiving  Support/Resources  Prognosis:   Unable to determine  Discharge Planning: [place] for rehab with Palliative care service follow-up      Primary Diagnoses: Present on Admission: . Squamous cell carcinoma of retromolar trigone ([place]) . Thoracic ascending aortic aneurysm ([place]) . Osteoarthritis . Drug-induced hypotension . CLL (chronic lymphocytic leukemia) ([place]) . BPH (benign prostatic hyperplasia) . (Resolved) Hypokalemia . Severe protein-calorie malnutrition ([place]), BMI 17 . Decreased oral intake . Failure to thrive in adult   I have reviewed the medical record, interviewed  the patient and family, and examined the patient. The following aspects are pertinent.  Past Medical History:  Diagnosis Date  . Alcohol abuse   . Allergic rhinitis   . Anemia in neoplastic disease    chronic  . Bilateral lower extremity edema    feet  . Borderline diabetes   . BPH (benign prostatic hypertrophy)   . CLL (chronic lymphocytic leukemia) [facility]) oncologist-  dr Heath Lark ([facility])   dx Jan 2014 --- Stage 1--  currently asymptomatic as of Apr 2016 and No active disease  . DDD (degenerative disc disease), cervical   . DDD (degenerative disc disease), lumbosacral   . 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 Dysphasia functional--  speech therapy   post op  radical neck dissection 07-21-2014--  changed diet to no chewy food , softer foods , states with the changes swallowed okay without any issues  . Glaucoma    both eyes  . History of gastroesophageal reflux (GERD)   . History of kidney stones    right side--  per CT from [facility] 01-17-2015 non-obstructing  . History of radiation therapy    09-04-2014 to 10-13-2014  Right retromolar region (tumor bed) and right neck/  60Gy in 30 fractions to tumor bed and 54Gy in 30 fractions to neck  . History of radiation to head and neck region 09-04-2014 to  10-13-2014   right retromolar region and right neck  60 Gy  . History of syncope    a. Event monitor 11/17: No evidence of significant AVB or > 3 second pause.; plan ILR if recurrent syncope in the future  . History of tracheostomy    post op radical neck dissection  07-21-2014--  post op tracheostomy on 07-25-2014  decannulated and sutured 07-29-2014  . Hypertension   . Macular degeneration    right eye only  . MRSA (methicillin resistant staph aureus) culture positive   . OA  (osteoarthritis)   . Organic impotence   . Pneumonia 12/2015  . Pulmonary nodule    benign  and stable per last CT  . Radiation-induced dermatitis   . Squamous cell carcinoma of retromolar trigone [facility]) oncologist-  dr Isidore Moos   Stage IVB, pT1b, pN0, Grade 2, +PNI, no LVSI---  S/P  RADICAL NECK DISSECTION AND RADIATION  . Thoracic ascending aortic aneurysm ([place]) 03/28/2016   Chest CTA 9/17: 4.6 cm fusiform ascending thoracic aortic aneurysm // Chest CTA 4/18: Ascending thoracic aortic aneurysm 4.2 cm >> repeat 1 year  . Tonsillar cancer [facility])    dx Dec 2015---  Right Tonsil, invasive squamous cell   . Transitional cell  carcinoma of bladder [facility]) urologist-- dr Junious Silk   High - grade  . Venous insufficiency    Social History   Socioeconomic History  . Marital status: Single    Spouse name: Not on file  . Number of children: 2  . Years of education: Not on file  . Highest education level: Not on file  Occupational History  . Not on file  Social Needs  . Financial resource strain: Not on file  . Food insecurity:    Worry: Not on file    Inability: Not on file  . Transportation needs:    Medical: Not on file    Non-medical: Not on file  Tobacco Use  . Smoking status: Never Smoker  . Smokeless tobacco: Never Used  Substance and Sexual Activity  . Alcohol use: Yes    Comment: vodka 3-4 daily   (Hx alcohol withdrawal )  . Drug use: No  . Sexual activity: Not on file  Lifestyle  . Physical activity:    Days per week: Not on file    Minutes per session: Not on file  . Stress: Not on file  Relationships  . Social connections:    Talks on phone: Not on file    Gets together: Not on file    Attends religious service: Not on file    Active member of club or organization: Not on file    Attends meetings of clubs or organizations: Not on file    Relationship status: Not on file  Other Topics Concern  . Not on file  Social History Narrative   Patient is divorced with 2  children.   Patient has never smoked. Patient has never used smokeless tobacco.    Patient drinks gin or vodka on a daily basis.   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 Cancer Mother        lung ca  . Alcohol abuse Sister   . Hypertension Neg Hx        family hx  . Sudden death Neg Hx        famiylhx  . Heart attack Neg Hx    Scheduled Meds: . brimonidine  1 drop Both Eyes BID   And  . timolol  1 drop Both Eyes BID  . enoxaparin (LOVENOX) injection  40 mg Subcutaneous Q24H  . feeding supplement (ENSURE ENLIVE)  237 mL Oral TID BM  . latanoprost  1 drop Both Eyes QPM  . mouth rinse  15 mL Mouth Rinse BID  . midodrine  10 mg Oral TID WC  . multivitamin with minerals  1 tablet Oral Daily  . Netarsudil Dimesylate  1 drop Right Eye QPM   Continuous Infusions: . sodium chloride 10 mL/hr at 03/23/18 1500  . sodium chloride 75 mL/hr at 03/24/18 1201   PRN Meds:.sodium chloride, acetaminophen **OR** acetaminophen, ibuprofen, ondansetron **OR** ondansetron (ZOFRAN) IV, polyethylene glycol Medications Prior to Admission:  Prior to Admission medications   Medication Sig Start Date End Date Taking? Authorizing Provider  brimonidine-timolol (COMBIGAN) 0.2-0.5 % ophthalmic solution Place 1 drop into both eyes 2 (two) times daily.    Yes [provider]  furosemide (LASIX) 20 MG tablet Take 20 mg by mouth daily. 02/09/15  Yes [provider]  latanoprost (XALATAN) 0.005 % ophthalmic solution Place 1  drop into both eyes every evening.  06/01/14  Yes [provider]  midodrine (PROAMATINE) 10 MG tablet TAKE 1 TABLET 3 TIMES A DAY WITH MEALS 03/08/18  Yes Marletta Lor, MD  RHOPRESSA 0.02 % SOLN Place 1 drop into the right eye daily. 09/28/17  Yes [provider]  feeding supplement, ENSURE ENLIVE, (ENSURE ENLIVE)  LIQD Take 237 mLs by mouth 2 (two) times daily between meals. Patient not taking: Reported on 03/21/2018 03/22/16   Hosie Poisson, MD  midodrine (PROAMATINE) 2.5 MG tablet TAKE 1 TABLET BY MOUTH 3 TIMES DAILY WITH MEALS. Patient not taking: Reported on 03/21/2018 02/11/18   Marletta Lor, MD  sodium fluoride (DENTA 5000 PLUS) 1.1 % CREA dental cream Apply a thin ribbon of gel to tooth brush. Brush teeth for 2 minutes. Spit out excess. Repeat nightly. Patient not taking: Reported on 11/01/2017 07/03/15   Lenn Cal, DDS  triamcinolone cream (KENALOG) 0.1 % Apply topically 2 (two) times daily. Patient not taking: Reported on 03/21/2018 02/26/17   Marletta Lor, MD   No Known Allergies Review of Systems No complaints.   Physical Exam No distress Appears weak Hard of hearing in R ear Regular S1 S 2 Abdomen is soft non tender Has dry skin and some senile purpura spots Trace edema  Vital Signs: BP 119/81 (BP Location: Left Arm)   Pulse 65   Temp 97.7 F (36.5 C) (Oral)   Resp 18   Ht 5\' 7"  (1.702 m)   Wt 50.3 kg   SpO2 97%   BMI 17.37 kg/m  Pain Scale: 0-10   Pain Score: 0-No pain   SpO2: SpO2: 97 % O2 Device:SpO2: 97 % O2 Flow Rate: .   IO: Intake/output summary:   Intake/Output Summary (Last 24 hours) at 03/24/2018 1310 Last data filed at 03/24/2018 0446 Gross per 24 hour  Intake 1775.66 ml  Output 375 ml  Net 1400.66 ml    LBM: Last BM Date: 03/17/18 Baseline Weight: Weight: 50.3 kg Most recent weight: Weight: 50.3 kg     Palliative Assessment/Data:   PPS 40%  Time In:  10 Time Out:  11 Time Total:  60 min  Greater than 50%  of this time was spent counseling and coordinating care related to the above assessment and plan.  Signed by: Loistine Chance, MD [phone]   Please contact Palliative Medicine Team phone at [phone] for questions and concerns.  For individual provider: See Shea Evans

## 2018-03-24 NOTE — Progress Notes (Signed)
PROGRESS NOTE    Timothy Kim  GEZ:[id number] DOB: [birth year] DOA: 03/21/2018 PCP: Marletta Lor, MD    Brief Narrative:  84 y.o. 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   He fell and bruised his bilateral knees and his bilateral elbows.  He has chronically poor p.o. due to his significant head and neck surgery and radiation.  He apparently has a history of syncope approximately 2 years ago with a plan to consider implantation of a loop recorder.    In the ED   Labs are notable for K 3.1, lactate of 3.46, sodium 133, white blood cell normal, normal hemoglobin and hematocrit.  X-ray of bilateral elbows knees and pelvis showed no acute fracture but did show contusions.  Patient was given IV fluids and potassium supplementation.lactic acid level is trending down. No evidence of infection with negative UA and CXR. PT/OT recommended SNF. 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)   Thoracic ascending aortic aneurysm (HCC)   Drug-induced hypotension   Fall at home, initial encounter   Dehydration   Severe protein-calorie malnutrition ([name]), BMI 17   Decreased oral intake   Failure to thrive in adult   Fall/possible syncope:  -Patient reports long hx of poor po intake, likely related to hx of squamous cell head and neck CA -Improved but still clinically dehydrated with poor skin turgor and dry mucus membranes -Continue on IVF hydration -Orthostatics noted to be negative -Will  continue with gentle IVF hydration -PT consulted with recommendations for SNF, SW consulted -palliative care consult for adult failure to thrive  Decreased oral intake -nutritional consult   Chronic hypotension: - Continue midodrine 10 mg 3 times daily - Presently stable  Chronic grade 1 diastolic heart failure: -Clinically dehydrated per above -Lasix remains on hold   Squamous cell carcinoma head and neck: At this time this appears to be in remission -Stable at present -Currenlty stable   CLL: Appears stable at this time   DVT prophylaxis: Lovenox subQ Code Status: DNR Family Communication: Pt in room, family not at bedside Disposition Plan: hopefully SNF in am  Consultants:   Palliative care and nutrition service  Procedures:     Antimicrobials: Anti-infectives (From admission, onward)   None      Subjective: Stating he's just generally not feeling well, continue to have back pain,  and poor appetite.  Objective: Vitals:   03/23/18 2008 03/24/18 0427 03/24/18 0745 03/24/18 1246  BP: 114/63 127/79 110/75 119/81  Pulse: (!) 59 60 64 65  Resp: 16 16 18 18   Temp: 98 F (36.7 C) 98.1 F (36.7 C) 98.6 F (37 C) 97.7 F (36.5 C)  TempSrc: Oral Oral Oral Oral  SpO2: 98% 97%  97%  Weight:      Height:        Intake/Output Summary (Last 24 hours) at 03/24/2018 1301 Last data filed at 03/24/2018 0446 Gross per 24 hour  Intake 1775.66 ml  Output 375 ml  Net 1400.66 ml   Filed Weights   03/21/18 1618  Weight:  50.3 kg    Examination: General exam: Awake, laying in bed, in nad, dry mucus membranes; chronic ill appearance; cachexia Respiratory system: Normal respiratory effort, no wheezing Cardiovascular system: regular rate, s1, s2 Gastrointestinal system: Soft, nondistended, positive BS Central nervous system: CN2-12 grossly intact, strength intact Extremities: Perfused, no clubbing Skin: poor skin turgor, bilateral LE skin with slough and  scales Psychiatry: Mood normal // no visual hallucinations   Data Reviewed: I have personally reviewed following labs and imaging studies  CBC: Recent Labs  Lab 03/21/18 1018 03/22/18 0525  WBC 6.9 8.4  NEUTROABS 3.7  --   HGB 13.2 11.7*  HCT 36.7* 33.9*  MCV 101.4* 104.3*  PLT 211 235   Basic Metabolic Panel: Recent Labs  Lab 03/21/18 0851 03/22/18 0525 03/23/18 0544 03/24/18 0504  NA 133* 138 135 137  K 3.1* 2.8* 3.2* 4.2  CL 74* 88* 94* 100  CO2 39* 40* 33* 31  GLUCOSE 109* 96 101* 93  BUN 19 17 13 13   CREATININE 0.83 0.69 0.60* 0.61  CALCIUM 9.6 8.4* 8.3* 8.4*  MG  --  1.4* 2.2  --    GFR: Estimated Creatinine Clearance: 48 mL/min (by C-G formula based on SCr of 0.61 mg/dL). Liver Function Tests: Recent Labs  Lab 03/21/18 0851  AST 31  ALT 11  ALKPHOS 84  BILITOT 1.4*  PROT 6.7  ALBUMIN 3.3*   No results for input(s): LIPASE, AMYLASE in the last 168 hours. No results for input(s): AMMONIA in the last 168 hours. Coagulation Profile: No results for input(s): INR, PROTIME in the last 168 hours. Cardiac Enzymes: No results for input(s): CKTOTAL, CKMB, CKMBINDEX, TROPONINI in the last 168 hours. BNP (last 3 results) No results for input(s): PROBNP in the last 8760 hours. HbA1C: No results for input(s): HGBA1C in the last 72 hours. CBG: Recent Labs  Lab 03/21/18 0908  GLUCAP 98   Lipid Profile: No results for input(s): CHOL, HDL, LDLCALC, TRIG, CHOLHDL, LDLDIRECT in the last 72 hours. Thyroid Function Tests: No results for input(s): TSH, T4TOTAL, FREET4, T3FREE, THYROIDAB in the last 72 hours. Anemia Panel: No results for input(s): VITAMINB12, FOLATE, FERRITIN, TIBC, IRON, RETICCTPCT in the last 72 hours. Sepsis Labs: Recent Labs  Lab 03/21/18 1025 03/21/18 1358  LATICACIDVEN 3.46* 2.61*    No results found for this or any previous visit (from the past 240 hour(s)).   Radiology Studies: No results found.  Scheduled Meds: . brimonidine   1 drop Both Eyes BID   And  . timolol  1 drop Both Eyes BID  . enoxaparin (LOVENOX) injection  40 mg Subcutaneous Q24H  . feeding supplement (ENSURE ENLIVE)  237 mL Oral TID BM  . latanoprost  1 drop Both Eyes QPM  . mouth rinse  15 mL Mouth Rinse BID  . midodrine  10 mg Oral TID WC  . multivitamin with minerals  1 tablet Oral Daily  . Netarsudil Dimesylate  1 drop Right Eye QPM   Continuous Infusions: . sodium chloride 10 mL/hr at 03/23/18 1500  . sodium chloride 75 mL/hr at 03/24/18 1201     LOS: 2 days   Paticia Stack, MD [facility] Pager On Amion  If 7PM-7AM, please contact night-coverage 03/24/2018, 1:01 PM

## 2018-03-24 NOTE — Progress Notes (Signed)
   03/24/18 1515  Clinical Encounter Type  Visited With Patient  Visit Type Initial  Referral From Palliative care team (Spiritual Care )  Consult/Referral To Chaplain  Chaplain introduced herself to Pt. as part of Palliative Care team recognizing spiritual care as important part of Pt. care. Pt. informed chaplain during conversation he used to be [facility] but is no longer religious. Pt. connected his health journey in  with his disconnect with religion. The chaplain heard the Pt. recognize his significant other of 33yrs-Kathleen, family, career, and gardening as giving his life meaning. The Pt. was quick to suggest a visit to [place]. The Pt. briefly  touched the topic of his next steps in care, in conjunction with where [place] Cory may live as she regains her strength.  The chaplain heard a gap in being honest with himself and upbeat, honest communication with his children. Pt. Indicated he prefers to talk to his children about their life instead of his declining health.  The chaplain and the Pt. agreed to another visit in the future.

## 2018-03-24 NOTE — Care Management Note (Signed)
Case Management Note  Patient Details  Name: Timothy Kim MRN: [id number] Date of Birth: 01/21/1930  Subjective/Objective:  Per palliative-patient/family agree SNF w/PCS-prefer [facility] following.                  Action/Plan:dc SNF w/PCS.   Expected Discharge Date:                  Expected Discharge Plan:  Skilled Nursing Facility  In-House Referral:  Clinical Social Work  Discharge planning Services  CM Consult  Post Acute Care Choice:    Choice offered to:     DME Arranged:    DME Agency:     HH Arranged:    [place] Agency:     Status of Service:  Completed, signed off  If discussed at [employer], dates discussed:    Additional Comments:  Dessa Phi, RN 03/24/2018, 11:06 AM

## 2018-03-24 NOTE — Progress Notes (Signed)
  Speech Language Pathology Treatment: Dysphagia  Patient Details Name: Timothy Kim MRN: [id number] DOB: 1930-02-19 Today's Date: 03/24/2018 Time: 1000-1045 SLP Time Calculation (min) (ACUTE ONLY): 45 min  Assessment / Plan / Recommendation Clinical Impression  Pt seen at bedside for education regarding safe swallow precautions. Pt verbalizes awareness of his dysphagia, and believes he needs to limit po to liquid. Trials of Ensure and grits were taken by pt, with no overt s/s aspiration on ensure. Pt reports residue of grits, which clears with liquid wash, but pt doesn't "want to deal with that". Pt had requested oatmeal for breakfast, but apparently this item is not included on a puree diet.  Pt verbalizes safe swallow steps, and "knows what [he] can handle". Education completed. ST signing off at this time. Please reconsult if needs arise.    HPI HPI: 84 yo male with h/o ETOH usage adm to [facility] with generalized weakness.  PMH + for head and neck cancer s/p radical neck dissection 07/21/14 requiring tracheostomy tube 1/29-07/25/14, sutured 07/29/14,  XRT 09/04/14-10/13/14 to both primary tumor site (retromolar) and neck region.  Pt also with right maxillectomy, tonsillar cancer *right* Dec 2015, macular degeneration, hearing loss *right ear deafness, ETOH use, DDD neck and spine, CXR showed 14 mm right lower lung pulmonary nodule, smaller compared to prior ct 2016.  Pt underwent esopahgram 11/21/2016 showing coating of laryngeal vestibule, no aspiration, esophageal dysmotility and moderate hiatal hernia.  Pt admits his dysphagia is worsening and he has lost weight due to it.  Recommend nutrition consult to help maximzie liquid nutrition as pt states this is easiest to swallow.        SLP Plan  All goals met - DC ST       Recommendations  Diet recommendations: Thin liquid;Dysphagia 1 (puree) Liquids provided via: Straw;Cup Medication Administration: Crushed with puree Supervision: Patient able to  self feed;Intermittent supervision to cue for compensatory strategies Compensations: Slow rate;Small sips/bites;Minimize environmental distractions Postural Changes and/or Swallow Maneuvers: Seated upright 90 degrees                Oral Care Recommendations: Oral care before and after PO SLP Visit Diagnosis: Dysphagia, oropharyngeal phase (R13.12) Plan: All goals met       GO              Daylani Deblois B. Quentin Ore [facility], CCC-SLP Speech Language Pathologist [phone]  Shonna Chock 03/24/2018, 10:51 AM

## 2018-03-25 DIAGNOSIS — C9110 Chronic lymphocytic leukemia of B-cell type not having achieved remission: Secondary | ICD-10-CM | POA: Diagnosis not present

## 2018-03-25 DIAGNOSIS — R1310 Dysphagia, unspecified: Secondary | ICD-10-CM | POA: Diagnosis not present

## 2018-03-25 DIAGNOSIS — N400 Enlarged prostate without lower urinary tract symptoms: Secondary | ICD-10-CM | POA: Diagnosis not present

## 2018-03-25 DIAGNOSIS — I9589 Other hypotension: Secondary | ICD-10-CM | POA: Diagnosis not present

## 2018-03-25 DIAGNOSIS — R278 Other lack of coordination: Secondary | ICD-10-CM | POA: Diagnosis not present

## 2018-03-25 DIAGNOSIS — R279 Unspecified lack of coordination: Secondary | ICD-10-CM | POA: Diagnosis not present

## 2018-03-25 DIAGNOSIS — I10 Essential (primary) hypertension: Secondary | ICD-10-CM | POA: Diagnosis not present

## 2018-03-25 DIAGNOSIS — Z743 Need for continuous supervision: Secondary | ICD-10-CM | POA: Diagnosis not present

## 2018-03-25 DIAGNOSIS — Z515 Encounter for palliative care: Secondary | ICD-10-CM | POA: Diagnosis not present

## 2018-03-25 DIAGNOSIS — R296 Repeated falls: Secondary | ICD-10-CM | POA: Diagnosis not present

## 2018-03-25 DIAGNOSIS — R5381 Other malaise: Secondary | ICD-10-CM

## 2018-03-25 DIAGNOSIS — Y92009 Unspecified place in unspecified non-institutional (private) residence as the place of occurrence of the external cause: Secondary | ICD-10-CM | POA: Diagnosis not present

## 2018-03-25 DIAGNOSIS — M1990 Unspecified osteoarthritis, unspecified site: Secondary | ICD-10-CM | POA: Diagnosis not present

## 2018-03-25 DIAGNOSIS — E46 Unspecified protein-calorie malnutrition: Secondary | ICD-10-CM | POA: Diagnosis not present

## 2018-03-25 DIAGNOSIS — R627 Adult failure to thrive: Secondary | ICD-10-CM | POA: Diagnosis not present

## 2018-03-25 DIAGNOSIS — C760 Malignant neoplasm of head, face and neck: Secondary | ICD-10-CM | POA: Diagnosis not present

## 2018-03-25 DIAGNOSIS — D630 Anemia in neoplastic disease: Secondary | ICD-10-CM | POA: Diagnosis not present

## 2018-03-25 DIAGNOSIS — H409 Unspecified glaucoma: Secondary | ICD-10-CM | POA: Diagnosis not present

## 2018-03-25 DIAGNOSIS — R2689 Other abnormalities of gait and mobility: Secondary | ICD-10-CM | POA: Diagnosis not present

## 2018-03-25 DIAGNOSIS — R531 Weakness: Secondary | ICD-10-CM | POA: Diagnosis not present

## 2018-03-25 DIAGNOSIS — R638 Other symptoms and signs concerning food and fluid intake: Secondary | ICD-10-CM | POA: Diagnosis not present

## 2018-03-25 DIAGNOSIS — W19XXXA Unspecified fall, initial encounter: Secondary | ICD-10-CM | POA: Diagnosis not present

## 2018-03-25 DIAGNOSIS — R55 Syncope and collapse: Secondary | ICD-10-CM | POA: Diagnosis not present

## 2018-03-25 DIAGNOSIS — E43 Unspecified severe protein-calorie malnutrition: Secondary | ICD-10-CM | POA: Diagnosis not present

## 2018-03-25 DIAGNOSIS — C9111 Chronic lymphocytic leukemia of B-cell type in remission: Secondary | ICD-10-CM | POA: Diagnosis not present

## 2018-03-25 DIAGNOSIS — I712 Thoracic aortic aneurysm, without rupture: Secondary | ICD-10-CM | POA: Diagnosis not present

## 2018-03-25 DIAGNOSIS — Z23 Encounter for immunization: Secondary | ICD-10-CM | POA: Diagnosis not present

## 2018-03-25 DIAGNOSIS — R1312 Dysphagia, oropharyngeal phase: Secondary | ICD-10-CM | POA: Diagnosis not present

## 2018-03-25 DIAGNOSIS — W1830XA Fall on same level, unspecified, initial encounter: Secondary | ICD-10-CM | POA: Diagnosis not present

## 2018-03-25 DIAGNOSIS — R600 Localized edema: Secondary | ICD-10-CM | POA: Diagnosis not present

## 2018-03-25 DIAGNOSIS — C062 Malignant neoplasm of retromolar area: Secondary | ICD-10-CM | POA: Diagnosis not present

## 2018-03-25 DIAGNOSIS — Y9301 Activity, walking, marching and hiking: Secondary | ICD-10-CM | POA: Diagnosis not present

## 2018-03-25 MED ORDER — ADULT MULTIVITAMIN W/MINERALS CH: 1.0000 | ORAL_TABLET | Freq: Every day | ORAL | 11 refills | Status: AC

## 2018-03-25 NOTE — Discharge Summary (Signed)
Physician Discharge Summary  Timothy Kim OZD:[id number] DOB: [birth year] DOA: 03/21/2018  PCP: Marletta Lor, MD  Admit date: 03/21/2018 Discharge date: 03/25/2018  Admitted From: home Disposition:  SNF  Recommendations for Outpatient Follow-up:  1. Follow up with PCP in 2-4 weeks 2. Follow up with palliative care in 2-4 weeks, consider hospice in the future if no clinical improvement from SNF 3. 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 He has chronically poor p.o. due to his significant head and neck surgery and radiation. He apparently has a history of syncope approximately 2 years ago with a plan to consider implantation of a loop recorder.   In the ED  Labs are notable for K 3.1, lactate of 3.46, sodium 133, white blood cell normal, normal hemoglobin and hematocrit. X-ray of bilateral elbows knees and pelvis showed no acute fracture but did show contusions. Patient was given IV fluids and potassium supplementation.lactic acid level is trending down. No evidence of infection with negative UA and CXR. After 3 days of iv hydration and encourage oral hydration, he's no longer dehydrated and no evidence of orthostatic hypotension. No more lasix upon discharge and his diastolic CHF is compensated.   PT/OT recommended SNF. Nutrition and Palliative care consulted. He's DNR and will be discharged to SNF with palliative care follow up as outpatient. He's still weak  and meets the criteria for adult failure to thrive. Prognosis is guarded.   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)   Thoracic ascending aortic aneurysm (HCC)   Fall at home, initial encounter   Severe protein-calorie malnutrition ([place]), BMI 17   Decreased oral intake   Failure to thrive in adult   Encounter for palliative care   Goals of care, counseling/discussion    Discharge Instructions  Discharge Instructions    Diet - low sodium heart healthy   Complete by:  As directed    Discharge instructions   Complete by:  As directed    Palliative care follow up as outpatient in 2-4 weeks Follow up with PCP in 2-4 weeks Oral hydration Nutrition supplement PT/OT to increase strength Fall precaution   Increase activity slowly   Complete by:  As directed      Allergies as of 03/25/2018   No Known Allergies     Medication List    STOP taking these medications   furosemide 20 MG tablet Commonly known as:  LASIX   sodium fluoride 1.1 % Crea dental cream Commonly known as:  PREVIDENT 5000 PLUS   triamcinolone cream 0.1 % Commonly known as:  KENALOG     TAKE these medications   brimonidine-timolol 0.2-0.5 % ophthalmic solution Commonly known as:  COMBIGAN Place 1 drop into both eyes 2 (two) times daily.   feeding supplement (ENSURE ENLIVE) Liqd Take 237 mLs by mouth 2 (two) times daily between meals.   latanoprost 0.005 % ophthalmic solution Commonly known as:  XALATAN Place 1 drop into  both eyes every evening.   midodrine 10 MG tablet Commonly known as:  PROAMATINE TAKE 1 TABLET 3 TIMES A DAY WITH MEALS What changed:  Another medication with the same name was removed. Continue taking this medication, and follow the directions you see here.   multivitamin with minerals  Tabs tablet Take 1 tablet by mouth daily. Start taking on:  03/26/2018   RHOPRESSA 0.02 % Soln Generic drug:  Netarsudil Dimesylate Place 1 drop into the right eye daily.      Follow-up Information    Marletta Lor, MD Follow up in 3 week(s).   Specialty:  Internal Medicine Contact information: [place] [address] [phone]          No Known Allergies  Consultations:  Palliative care   Procedures/Studies: Dg Chest 2 View  Result Date: 03/21/2018 CLINICAL DATA:  Status post fall. EXAM: CHEST - 2 VIEW COMPARISON:  CT chest 09/26/2016, 01/04/2015 FINDINGS: There is no focal consolidation. There is a 14 mm right lower lobe pulmonary nodule smaller compared with prior CT chest dated 01/04/2015. There is no pleural effusion or pneumothorax. The heart and mediastinal contours are unremarkable. The osseous structures are unremarkable. IMPRESSION: No active cardiopulmonary disease. 14 mm right lower lobe pulmonary nodule smaller compared with prior CT chest dated 01/04/2015. Electronically Signed   By: Kathreen Devoid   On: 03/21/2018 12:47   Dg Pelvis 1-2 Views  Result Date: 03/21/2018 CLINICAL DATA:  Pain after fall EXAM: PELVIS - 1-2 VIEW COMPARISON:  None. FINDINGS: The right hip is internally rotated limiting evaluation. No obvious fracture in the right hip. Left hip appears grossly intact. The pelvic bones are without obvious fracture. IMPRESSION: No identified fractures. Mildly limited evaluation of the right hip due to internal rotation. Electronically Signed   By: Dorise Bullion III M.D   On: 03/21/2018 11:15   Dg Elbow Complete Left  Result Date: 03/21/2018 CLINICAL DATA:  Status post fall.  Left elbow pain. EXAM: LEFT ELBOW - COMPLETE 3+ VIEW COMPARISON:  None. FINDINGS: There is no evidence of fracture, dislocation, or joint effusion. There is no evidence of arthropathy or other focal bone abnormality. There is generalized osteopenia. There  are mild enthesopathic changes at the common flexor tendon origin. Soft tissues are unremarkable. IMPRESSION: No acute osseous injury of the left elbow. Electronically Signed   By: Kathreen Devoid   On: 03/21/2018 11:12   Dg Elbow Complete Right  Result Date: 03/21/2018 CLINICAL DATA:  Status post fall. EXAM: RIGHT ELBOW - COMPLETE 3+ VIEW COMPARISON:  None. FINDINGS: There is no evidence of fracture, dislocation, or joint effusion. There is no evidence of arthropathy or other focal bone abnormality. There is generalized osteopenia mild enthesopathic changes at the common flexor tendon origin. Soft tissues are unremarkable. IMPRESSION: No acute osseous injury of the right elbow. Electronically Signed   By: Kathreen Devoid   On: 03/21/2018 11:11   Dg Knee Complete 4 Views Left  Result Date: 03/21/2018 CLINICAL DATA:  Pain following fall EXAM: LEFT KNEE - COMPLETE 4+ VIEW COMPARISON:  March 18, 2016 FINDINGS: Frontal, lateral, and bilateral oblique views were obtained. There is a bipartite patella, an anatomic variant. No evident fracture or dislocation. No joint effusion. Joint spaces appear unremarkable. There is slight chondrocalcinosis. No erosive change. IMPRESSION: Slight chondrocalcinosis, a finding that may be seen with osteoarthritis or calcium pyrophosphate deposition disease. No appreciable joint space narrowing or erosion. No fracture or joint effusion. Bipartite patella noted,  an anatomic variant. Electronically Signed   By: Lowella Grip III M.D.   On: 03/21/2018 11:13   Dg Knee Complete 4 Views Right  Result Date: 03/21/2018 CLINICAL DATA:  Pain following fall EXAM: RIGHT KNEE - COMPLETE 4+ VIEW COMPARISON:  Right knee radiographs and right knee CT March 18, 2018 FINDINGS: Frontal, lateral, and bilateral oblique views were obtained. There is an apparent bipartite patella, stable. No acute fracture or dislocation. No joint effusion. There is slight narrowing medially and in the  patellofemoral joint regions. There is chondrocalcinosis. No erosive change. IMPRESSION: No acute fracture or dislocation. No joint effusion. Bipartite patella, an anatomic variant. There is joint space narrowing medially and in the patellofemoral joint regions, similar to prior study. Chondrocalcinosis present, a finding that may be due to osteoarthritis or calcium pyrophosphate deposition disease. Electronically Signed   By: Lowella Grip III M.D.   On: 03/21/2018 11:15       Subjective:   Discharge Exam: Vitals:   03/24/18 2035 03/25/18 0433  BP: 119/76 (!) 152/83  Pulse: 70 66  Resp: 18 18  Temp: 98.4 F (36.9 C) 98.4 F (36.9 C)  SpO2: 100% 93%   Vitals:   03/24/18 0745 03/24/18 1246 03/24/18 2035 03/25/18 0433  BP: 110/75 119/81 119/76 (!) 152/83  Pulse: 64 65 70 66  Resp: 18 18 18 18   Temp: 98.6 F (37 C) 97.7 F (36.5 C) 98.4 F (36.9 C) 98.4 F (36.9 C)  TempSrc: Oral Oral Oral Oral  SpO2:  97% 100% 93%  Weight:      Height:        Examination: General exam: Awake, laying in bed, in nad, dry mucus membranes; chronic ill appearance; cachexia Respiratory system: Normal respiratory effort, no wheezing Cardiovascular system: regular rate, s1, s2 Gastrointestinal system: Soft, nondistended, positive BS Central nervous system: CN2-12 grossly intact, strength intact Extremities: Perfused, no clubbing Skin: poor skin turgor, bilateral LE skin with slough and scales Psychiatry: Mood normal // no visual hallucinations     The results of significant diagnostics from this hospitalization (including imaging, microbiology, ancillary and laboratory) are listed below for reference.     Microbiology: No results found for this or any previous visit (from the past 240 hour(s)).   Labs: BNP (last 3 results) No results for input(s): BNP in the last 8760 hours. Basic Metabolic Panel: Recent Labs  Lab 03/21/18 0851 03/22/18 0525 03/23/18 0544 03/24/18 0504  NA  133* 138 135 137  K 3.1* 2.8* 3.2* 4.2  CL 74* 88* 94* 100  CO2 39* 40* 33* 31  GLUCOSE 109* 96 101* 93  BUN 19 17 13 13   CREATININE 0.83 0.69 0.60* 0.61  CALCIUM 9.6 8.4* 8.3* 8.4*  MG  --  1.4* 2.2  --    Liver Function Tests: Recent Labs  Lab 03/21/18 0851  AST 31  ALT 11  ALKPHOS 84  BILITOT 1.4*  PROT 6.7  ALBUMIN 3.3*   No results for input(s): LIPASE, AMYLASE in the last 168 hours. No results for input(s): AMMONIA in the last 168 hours. CBC: Recent Labs  Lab 03/21/18 1018 03/22/18 0525  WBC 6.9 8.4  NEUTROABS 3.7  --   HGB 13.2 11.7*  HCT 36.7* 33.9*  MCV 101.4* 104.3*  PLT 211 180   Cardiac Enzymes: No results for input(s): CKTOTAL, CKMB, CKMBINDEX, TROPONINI in the last 168 hours. BNP: Invalid input(s): POCBNP CBG: Recent Labs  Lab 03/21/18 0908  GLUCAP 98   D-Dimer No results  for input(s): DDIMER in the last 72 hours. Hgb A1c No results for input(s): HGBA1C in the last 72 hours. Lipid Profile No results for input(s): CHOL, HDL, LDLCALC, TRIG, CHOLHDL, LDLDIRECT in the last 72 hours. Thyroid function studies No results for input(s): TSH, T4TOTAL, T3FREE, THYROIDAB in the last 72 hours.  Invalid input(s): FREET3 Anemia work up No results for input(s): VITAMINB12, FOLATE, FERRITIN, TIBC, IRON, RETICCTPCT in the last 72 hours. Urinalysis    Component Value Date/Time   COLORURINE YELLOW 03/21/2018 0920   APPEARANCEUR CLEAR 03/21/2018 0920   LABSPEC 1.008 03/21/2018 0920   LABSPEC 1.010 06/07/2014 1535   PHURINE 8.0 03/21/2018 0920   GLUCOSEU NEGATIVE 03/21/2018 0920   GLUCOSEU Negative 06/07/2014 1535   HGBUR NEGATIVE 03/21/2018 0920   BILIRUBINUR NEGATIVE 03/21/2018 0920   BILIRUBINUR Negative 06/07/2014 1535   KETONESUR NEGATIVE 03/21/2018 0920   PROTEINUR NEGATIVE 03/21/2018 0920   UROBILINOGEN 0.2 06/07/2014 1535   NITRITE NEGATIVE 03/21/2018 0920   LEUKOCYTESUR NEGATIVE 03/21/2018 0920   LEUKOCYTESUR Trace 06/07/2014 1535   Sepsis  Labs Invalid input(s): PROCALCITONIN,  WBC,  LACTICIDVEN Microbiology No results found for this or any previous visit (from the past 240 hour(s)).   Time coordinating discharge: 38 minutes  SIGNED:   Paticia Stack, MD  [facility] 03/25/2018, 11:18 AM Pager   If 7PM-7AM, please contact night-coverage [email or web address] Password [email or web address]

## 2018-03-25 NOTE — Clinical Social Work Placement (Signed)
Patient received and accepted bed offer at [facility] SNF. Facility aware of patient's discharge and confirmed bed offer. PTAR contacted, patient's family notified. Patient's RN can call report to [phone] Room 3221, packet complete. CSW signing off, no other needs identified at this time.  CLINICAL SOCIAL WORK PLACEMENT  NOTE  Date:  03/25/2018  Patient Details  Name: Timothy Kim MRN: [id number] Date of Birth: Jan 23, 1930  Clinical Social Work is seeking post-discharge placement for this patient at the [place] level of care (*CSW will initial, date and re-position this form in  chart as items are completed):  Yes   Patient/family provided with [facility] Work Department's list of facilities offering this level of care within the geographic area requested by the patient (or if unable, by the patient's family).  Yes   Patient/family informed of their freedom to choose among providers that offer the needed level of care, that participate in Medicare, Medicaid or managed care program needed by the patient, have an available bed and are willing to accept the patient.  Yes   Patient/family informed of [facility]'s ownership interest in [facility] and [facility], as well as of the fact that they are under no obligation to receive care at these facilities.  PASRR submitted to EDS on 03/24/18     PASRR number received on 03/24/18     Existing PASRR number confirmed on       FL2 transmitted to all facilities in geographic area requested by pt/family on 03/24/18     FL2 transmitted to all facilities within larger geographic area on       Patient informed that his/her managed care company has contracts with or will negotiate with certain facilities, including the following:        Yes   Patient/family informed of bed offers received.  Patient chooses bed at [facility]     Physician recommends and patient chooses bed at       Patient to be transferred to [facility] on 03/25/18.  Patient to be transferred to facility by PTAR     Patient family notified on 03/25/18 of transfer.  Name of family member notified:  Donnetta Simpers     PHYSICIAN       Additional Comment:    _______________________________________________ Burnis Medin, LCSW 03/25/2018, 2:42 PM

## 2018-03-25 NOTE — Care Management Important Message (Signed)
Important Message  Patient Details  Name: Timothy Kim MRN: [id number] Date of Birth: [birth year]   Medicare Important Message Given:  Yes    Kerin Salen 03/25/2018, 11:54 AMImportant Message  Patient Details  Name: Timothy Kim MRN: [id number] Date of Birth: 1930-04-09   Medicare Important Message Given:  Yes    Kerin Salen 03/25/2018, 11:54 AM

## 2018-03-26 DIAGNOSIS — R627 Adult failure to thrive: Secondary | ICD-10-CM | POA: Diagnosis not present

## 2018-03-26 DIAGNOSIS — M1990 Unspecified osteoarthritis, unspecified site: Secondary | ICD-10-CM | POA: Diagnosis not present

## 2018-03-26 DIAGNOSIS — C760 Malignant neoplasm of head, face and neck: Secondary | ICD-10-CM | POA: Diagnosis not present

## 2018-03-26 DIAGNOSIS — R296 Repeated falls: Secondary | ICD-10-CM | POA: Diagnosis not present

## 2018-03-26 DIAGNOSIS — R55 Syncope and collapse: Secondary | ICD-10-CM | POA: Diagnosis not present

## 2018-03-26 DIAGNOSIS — I9589 Other hypotension: Secondary | ICD-10-CM | POA: Diagnosis not present

## 2018-03-26 DIAGNOSIS — R531 Weakness: Secondary | ICD-10-CM | POA: Diagnosis not present

## 2018-03-26 DIAGNOSIS — I712 Thoracic aortic aneurysm, without rupture: Secondary | ICD-10-CM | POA: Diagnosis not present

## 2018-03-26 DIAGNOSIS — R1310 Dysphagia, unspecified: Secondary | ICD-10-CM | POA: Diagnosis not present

## 2018-03-26 DIAGNOSIS — C062 Malignant neoplasm of retromolar area: Secondary | ICD-10-CM | POA: Diagnosis not present

## 2018-03-26 DIAGNOSIS — E46 Unspecified protein-calorie malnutrition: Secondary | ICD-10-CM | POA: Diagnosis not present

## 2018-03-26 DIAGNOSIS — C9110 Chronic lymphocytic leukemia of B-cell type not having achieved remission: Secondary | ICD-10-CM | POA: Diagnosis not present

## 2018-04-02 ENCOUNTER — Non-Acute Institutional Stay: Payer: Medicare Other | Admitting: Internal Medicine

## 2018-04-02 VITALS — BP 120/74 | HR 62 | Resp 16 | Ht 67.0 in | Wt 110.0 lb

## 2018-04-02 DIAGNOSIS — E43 Unspecified severe protein-calorie malnutrition: Secondary | ICD-10-CM

## 2018-04-02 DIAGNOSIS — R531 Weakness: Secondary | ICD-10-CM

## 2018-04-02 DIAGNOSIS — R296 Repeated falls: Secondary | ICD-10-CM | POA: Diagnosis not present

## 2018-04-02 DIAGNOSIS — Z515 Encounter for palliative care: Secondary | ICD-10-CM

## 2018-04-02 DIAGNOSIS — R627 Adult failure to thrive: Secondary | ICD-10-CM | POA: Diagnosis not present

## 2018-04-02 DIAGNOSIS — C062 Malignant neoplasm of retromolar area: Secondary | ICD-10-CM | POA: Diagnosis not present

## 2018-04-05 ENCOUNTER — Encounter: Payer: Self-pay | Admitting: Internal Medicine

## 2018-04-05 NOTE — Progress Notes (Signed)
PALLIATIVE CARE CONSULT VISIT   PATIENT NAME: Timothy Kim DOB: [birth year] MRN: [id number]  PRIMARY CARE PROVIDER:   Marletta Lor, MD  REFERRING PROVIDER:  Marletta Lor, MD Timothy Kim, [place] [address]  RESPONSIBLE PARTY: Timothy Kim is son Timothy Kim ([place]). Live in long term companion (of 37 years): Timothy Kim  [phone]. Daughter: Timothy Kim [facility]).   RECOMMENDATIONS and PLAN:  1.Weakess: Ongoing inpatient rehab Timothy Kim. Would benefit from f/u outpatient PT/OT post discharge if insurance covers.  2.Protein Calorie Malnutrition: Consider initiation of Remeron 7.5mg  qd for appetite stimulation.  3. Goals of Care: DNR in place. Otherwise, desires full scope of medical care. Not a hospice candidate for this reason.  4. Follow up:  A. Patient's PCP Timothy Kim has retired. Patient's long term companion Timothy Kim plans to call and have patient establish with Dr. Micheline Kim [facility] [phone]) at time of patient's discharge from Timothy Kim. B. F/U home visit with Palliative Care NP 1 month.  I spent 75 minutes providing this consultation,  from 1pm to 2:15pm. More than 50% of the time in this consultation was spent coordinating communication.   HISTORY OF PRESENT ILLNESS:  Timothy Kim is a 84 y.o. 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 He is s/p recent hospitalization (9/29-10/08/2017) for fall/orthostatic hypotension.Treated with  IVF rehydration with discontinuation of diuretics. Palliative Care was asked to help address goals of care.    CODE STATUS: DNR  ROS: Patient's PPS is a weak 40%. Needs 1 person assist for transfers, hygiene, and dressing. Recent hospitalization s/p fall. Treated for orthostatic hypotension with IVFs and discharged off  his diuretic. Patient notes gradually progressive weakness over the last 4-5 months. He ambulates with a rolling walker and is working with facility PT. Today patient continues to feel weak but believes approaching his baseline. Prior radical R neck and jaw surgery has caused a malocclusion of his jaw, making it difficult for him to chew and swallow. Majority of his daily intake includes orange juice and ensure. Recent weight (03/21/2018) was 110lbs. Height 5'7". BMI 17.36kg/m2. He has had a weight loss of 16lbs (12.7% of body weight) over 4 months, loss of 33lbs (23% of body weight) over 10 month. Baseline weight in 2016 was 174lbs. He has chronic bilateral LE pitting edema to high calf, which waxes and wanes. He has some band aids covering skin tears on his bilateral elbows and right hand. He has episodic pain form a recurrent detached right rotator cuff. Lower back pain from DJD which keeps him from standing up straight.   HOSPICE ELIGIBILITY/DIAGNOSIS: Would qualify under Protein Calorie Malnutrition, but patient desires full scope of medical treatments.  PAST MEDICAL HISTORY:  Past Medical History:  Diagnosis Date  . Alcohol abuse   . Allergic rhinitis   . Anemia in neoplastic disease    chronic  . Bilateral lower extremity edema    feet  . Borderline diabetes   . BPH (benign prostatic hypertrophy)   . CLL (chronic lymphocytic leukemia) [facility]) oncologist-  dr Timothy Kim ([facility])   dx Jan 2014 --- Stage 1--  currently asymptomatic as of Apr 2016 and No active disease  . DDD (degenerative disc disease), cervical   . DDD (degenerative disc disease), lumbosacral   . 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 Dysphasia functional--  speech therapy   post op  radical neck dissection 07-21-2014--  changed diet to  no chewy food , softer foods , states with the changes swallowed okay without any issues  . Glaucoma    both eyes  . History of gastroesophageal reflux (GERD)   . History of kidney stones    right side--  per [facility] from [facility] 01-17-2015 non-obstructing  . History of radiation therapy    09-04-2014 to 10-13-2014  Right retromolar region (tumor bed) and right neck/  60Gy in 30 fractions to tumor bed and 54Gy in 30 fractions to neck  . History of radiation to head and neck region 09-04-2014 to  10-13-2014   right retromolar region and right neck  60 Gy  . History of syncope    a. Event monitor 11/17: No evidence of significant AVB or > 3 second pause.; plan ILR if recurrent syncope in the future  . History of tracheostomy    post op radical neck dissection  07-21-2014--  post op tracheostomy on 07-25-2014  decannulated and sutured 07-29-2014  . Hypertension   . Macular degeneration    right eye only  . MRSA (methicillin resistant staph aureus) culture positive   . OA (osteoarthritis)   . Organic impotence   . Pneumonia 12/2015  . Pulmonary nodule    benign  and stable per last CT  . Radiation-induced dermatitis   . Squamous cell carcinoma of retromolar [facility]) oncologist-  dr Isidore Moos   Stage IVB, pT1b, pN0, Grade 2, +PNI, no LVSI---  S/P  RADICAL NECK DISSECTION AND RADIATION  . Thoracic ascending aortic aneurysm ([place]) 03/28/2016   Chest CTA 9/17: 4.6 cm fusiform ascending thoracic aortic aneurysm // Chest CTA 4/18: Ascending thoracic aortic aneurysm 4.2 cm >> repeat 1 year  . 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 Transitional cell carcinoma of bladder [facility]) urologist-- dr Junious Silk   High - grade  . Venous insufficiency     SOCIAL HX:  Social History   Tobacco Use  . Smoking status: Never Smoker  . Smokeless tobacco: Never Used  Substance Use Topics  . Alcohol use: Yes    Comment: vodka 3-4 daily   (Hx alcohol withdrawal  )    ALLERGIES: No Known Allergies   PERTINENT MEDICATIONS:  Outpatient Encounter Medications as of 04/02/2018  Medication Sig  . brimonidine-timolol (COMBIGAN) 0.2-0.5 % ophthalmic solution Place 1 drop into both eyes 2 (two) times daily.   . feeding supplement, ENSURE ENLIVE, (ENSURE ENLIVE) LIQD Take 237 mLs by mouth 2 (two) times daily between meals. (Patient not taking: Reported on 03/21/2018)  . latanoprost (XALATAN) 0.005 % ophthalmic solution Place 1 drop into both eyes every evening.   . midodrine (PROAMATINE) 10 MG tablet TAKE 1 TABLET 3 TIMES A DAY WITH MEALS  . Multiple Vitamin (MULTIVITAMIN WITH MINERALS) TABS tablet Take 1 tablet by mouth daily.  . RHOPRESSA 0.02 % SOLN Place 1 drop into the right eye daily.   No facility-administered encounter medications on file as of 04/02/2018.     PHYSICAL EXAM:  Frail elderly male sitting up in his wheelchair. Alert and pleasantly conversant VS: 120/74, HR 62 sats 98% room air. RR 16 HEENT: [place], AT LUNGS:  LSCTA CARDIAC: RRR without MRG ABD: soft, non distended,  NABS EXTREMITIES: Bilateral LE pitting edema to high calf MUSCULOSKELETAL:  Facial, extremity, trunk adipose and muscular wasting. SKIN: healing skin tears left/right elbows, and left hand NEURO: grossly non-focal. generalized weakness, A & O x 3. Julianne Handler, NP

## 2018-04-08 ENCOUNTER — Other Ambulatory Visit: Payer: Self-pay | Admitting: *Deleted

## 2018-04-08 NOTE — Patient Outreach (Signed)
[facility]) Care Management  04/08/2018  Timothy Kim [birth year] [id number]  Per Epic chart review, patient is being followed by palliative care at [facility]. Met with Timothy Kim, SW at [place], she confirms that patient is being followed by [facility].   No [facility] care management needs identified at this time as he is followed by [facility] services. Will sign off. Royetta Crochet. Laymond Purser, RN, BSN, [place] [phone]) Business Cell  [phone]) Toll Free Office

## 2018-04-09 DIAGNOSIS — E43 Unspecified severe protein-calorie malnutrition: Secondary | ICD-10-CM | POA: Diagnosis not present

## 2018-04-09 DIAGNOSIS — R627 Adult failure to thrive: Secondary | ICD-10-CM | POA: Diagnosis not present

## 2018-04-09 DIAGNOSIS — R296 Repeated falls: Secondary | ICD-10-CM | POA: Diagnosis not present

## 2018-04-09 DIAGNOSIS — C062 Malignant neoplasm of retromolar area: Secondary | ICD-10-CM | POA: Diagnosis not present

## 2018-04-13 DIAGNOSIS — E43 Unspecified severe protein-calorie malnutrition: Secondary | ICD-10-CM | POA: Diagnosis not present

## 2018-04-13 DIAGNOSIS — R600 Localized edema: Secondary | ICD-10-CM | POA: Diagnosis not present

## 2018-04-13 DIAGNOSIS — C9111 Chronic lymphocytic leukemia of B-cell type in remission: Secondary | ICD-10-CM | POA: Diagnosis not present

## 2018-04-13 DIAGNOSIS — C062 Malignant neoplasm of retromolar area: Secondary | ICD-10-CM | POA: Diagnosis not present

## 2018-04-15 DIAGNOSIS — I872 Venous insufficiency (chronic) (peripheral): Secondary | ICD-10-CM | POA: Diagnosis not present

## 2018-04-15 DIAGNOSIS — I5030 Unspecified diastolic (congestive) heart failure: Secondary | ICD-10-CM | POA: Diagnosis not present

## 2018-04-15 DIAGNOSIS — I110 Hypertensive heart disease with heart failure: Secondary | ICD-10-CM | POA: Diagnosis not present

## 2018-04-15 DIAGNOSIS — I9589 Other hypotension: Secondary | ICD-10-CM | POA: Diagnosis not present

## 2018-04-15 DIAGNOSIS — R911 Solitary pulmonary nodule: Secondary | ICD-10-CM | POA: Diagnosis not present

## 2018-04-15 DIAGNOSIS — M47817 Spondylosis without myelopathy or radiculopathy, lumbosacral region: Secondary | ICD-10-CM | POA: Diagnosis not present

## 2018-04-15 DIAGNOSIS — D630 Anemia in neoplastic disease: Secondary | ICD-10-CM | POA: Diagnosis not present

## 2018-04-15 DIAGNOSIS — H3530 Unspecified macular degeneration: Secondary | ICD-10-CM | POA: Diagnosis not present

## 2018-04-15 DIAGNOSIS — C062 Malignant neoplasm of retromolar area: Secondary | ICD-10-CM | POA: Diagnosis not present

## 2018-04-15 DIAGNOSIS — M75101 Unspecified rotator cuff tear or rupture of right shoulder, not specified as traumatic: Secondary | ICD-10-CM | POA: Diagnosis not present

## 2018-04-15 DIAGNOSIS — J309 Allergic rhinitis, unspecified: Secondary | ICD-10-CM | POA: Diagnosis not present

## 2018-04-15 DIAGNOSIS — S80921D Unspecified superficial injury of right lower leg, subsequent encounter: Secondary | ICD-10-CM | POA: Diagnosis not present

## 2018-04-15 DIAGNOSIS — E43 Unspecified severe protein-calorie malnutrition: Secondary | ICD-10-CM | POA: Diagnosis not present

## 2018-04-15 DIAGNOSIS — M5030 Other cervical disc degeneration, unspecified cervical region: Secondary | ICD-10-CM | POA: Diagnosis not present

## 2018-04-15 DIAGNOSIS — C9111 Chronic lymphocytic leukemia of B-cell type in remission: Secondary | ICD-10-CM | POA: Diagnosis not present

## 2018-04-15 DIAGNOSIS — I711 Thoracic aortic aneurysm, ruptured: Secondary | ICD-10-CM | POA: Diagnosis not present

## 2018-04-15 DIAGNOSIS — R7303 Prediabetes: Secondary | ICD-10-CM | POA: Diagnosis not present

## 2018-04-15 DIAGNOSIS — C679 Malignant neoplasm of bladder, unspecified: Secondary | ICD-10-CM | POA: Diagnosis not present

## 2018-04-15 DIAGNOSIS — Z9181 History of falling: Secondary | ICD-10-CM | POA: Diagnosis not present

## 2018-04-15 DIAGNOSIS — N200 Calculus of kidney: Secondary | ICD-10-CM | POA: Diagnosis not present

## 2018-04-15 DIAGNOSIS — N400 Enlarged prostate without lower urinary tract symptoms: Secondary | ICD-10-CM | POA: Diagnosis not present

## 2018-04-15 DIAGNOSIS — R1310 Dysphagia, unspecified: Secondary | ICD-10-CM | POA: Diagnosis not present

## 2018-04-15 DIAGNOSIS — H4089 Other specified glaucoma: Secondary | ICD-10-CM | POA: Diagnosis not present

## 2018-04-15 DIAGNOSIS — K219 Gastro-esophageal reflux disease without esophagitis: Secondary | ICD-10-CM | POA: Diagnosis not present

## 2018-04-16 ENCOUNTER — Telehealth: Payer: Self-pay | Admitting: Internal Medicine

## 2018-04-16 DIAGNOSIS — D630 Anemia in neoplastic disease: Secondary | ICD-10-CM | POA: Diagnosis not present

## 2018-04-16 DIAGNOSIS — I872 Venous insufficiency (chronic) (peripheral): Secondary | ICD-10-CM | POA: Diagnosis not present

## 2018-04-16 DIAGNOSIS — E43 Unspecified severe protein-calorie malnutrition: Secondary | ICD-10-CM | POA: Diagnosis not present

## 2018-04-16 DIAGNOSIS — S80921D Unspecified superficial injury of right lower leg, subsequent encounter: Secondary | ICD-10-CM | POA: Diagnosis not present

## 2018-04-16 DIAGNOSIS — C062 Malignant neoplasm of retromolar area: Secondary | ICD-10-CM | POA: Diagnosis not present

## 2018-04-16 DIAGNOSIS — C9111 Chronic lymphocytic leukemia of B-cell type in remission: Secondary | ICD-10-CM | POA: Diagnosis not present

## 2018-04-16 NOTE — Telephone Encounter (Signed)
Copied from [place] [phone]. Topic: Quick Communication - See Telephone Encounter >> Apr 16, 2018  4:19 PM Vernona Rieger wrote: CRM for notification. See Telephone encounter for: 04/16/18.  DON OCCUPATIONAL THERAPIST WITH [facility] CALLED FOR PLAN OF CARE, HE WANTS TO SEE THE PATIENT FOR :::  ONE TIME A WEEK FOR TWO WEEKS ZERO TIMES A WEEK FOR THREE WEEKS ONE TIME A WEEK FOR ONE WEEK  FOR ADL AND EXERCISE AND D/C WHEN GOALS MET OR MAXIMUM POTENTIAL   CALL BACK [phone]

## 2018-04-19 DIAGNOSIS — I872 Venous insufficiency (chronic) (peripheral): Secondary | ICD-10-CM | POA: Diagnosis not present

## 2018-04-19 DIAGNOSIS — E43 Unspecified severe protein-calorie malnutrition: Secondary | ICD-10-CM | POA: Diagnosis not present

## 2018-04-19 DIAGNOSIS — C9111 Chronic lymphocytic leukemia of B-cell type in remission: Secondary | ICD-10-CM | POA: Diagnosis not present

## 2018-04-19 DIAGNOSIS — C062 Malignant neoplasm of retromolar area: Secondary | ICD-10-CM | POA: Diagnosis not present

## 2018-04-19 DIAGNOSIS — D630 Anemia in neoplastic disease: Secondary | ICD-10-CM | POA: Diagnosis not present

## 2018-04-19 DIAGNOSIS — S80921D Unspecified superficial injury of right lower leg, subsequent encounter: Secondary | ICD-10-CM | POA: Diagnosis not present

## 2018-04-19 NOTE — Telephone Encounter (Signed)
Verbal orders given to [facility] via vm.

## 2018-04-20 DIAGNOSIS — C9111 Chronic lymphocytic leukemia of B-cell type in remission: Secondary | ICD-10-CM | POA: Diagnosis not present

## 2018-04-20 DIAGNOSIS — S80921D Unspecified superficial injury of right lower leg, subsequent encounter: Secondary | ICD-10-CM | POA: Diagnosis not present

## 2018-04-20 DIAGNOSIS — E43 Unspecified severe protein-calorie malnutrition: Secondary | ICD-10-CM | POA: Diagnosis not present

## 2018-04-20 DIAGNOSIS — I872 Venous insufficiency (chronic) (peripheral): Secondary | ICD-10-CM | POA: Diagnosis not present

## 2018-04-20 DIAGNOSIS — D630 Anemia in neoplastic disease: Secondary | ICD-10-CM | POA: Diagnosis not present

## 2018-04-20 DIAGNOSIS — C062 Malignant neoplasm of retromolar area: Secondary | ICD-10-CM | POA: Diagnosis not present

## 2018-04-21 DIAGNOSIS — D630 Anemia in neoplastic disease: Secondary | ICD-10-CM | POA: Diagnosis not present

## 2018-04-21 DIAGNOSIS — S80921D Unspecified superficial injury of right lower leg, subsequent encounter: Secondary | ICD-10-CM | POA: Diagnosis not present

## 2018-04-21 DIAGNOSIS — E43 Unspecified severe protein-calorie malnutrition: Secondary | ICD-10-CM | POA: Diagnosis not present

## 2018-04-21 DIAGNOSIS — C062 Malignant neoplasm of retromolar area: Secondary | ICD-10-CM | POA: Diagnosis not present

## 2018-04-21 DIAGNOSIS — I872 Venous insufficiency (chronic) (peripheral): Secondary | ICD-10-CM | POA: Diagnosis not present

## 2018-04-21 DIAGNOSIS — C9111 Chronic lymphocytic leukemia of B-cell type in remission: Secondary | ICD-10-CM | POA: Diagnosis not present

## 2018-04-22 DIAGNOSIS — C9111 Chronic lymphocytic leukemia of B-cell type in remission: Secondary | ICD-10-CM | POA: Diagnosis not present

## 2018-04-22 DIAGNOSIS — D630 Anemia in neoplastic disease: Secondary | ICD-10-CM | POA: Diagnosis not present

## 2018-04-22 DIAGNOSIS — I872 Venous insufficiency (chronic) (peripheral): Secondary | ICD-10-CM | POA: Diagnosis not present

## 2018-04-22 DIAGNOSIS — E43 Unspecified severe protein-calorie malnutrition: Secondary | ICD-10-CM | POA: Diagnosis not present

## 2018-04-22 DIAGNOSIS — C062 Malignant neoplasm of retromolar area: Secondary | ICD-10-CM | POA: Diagnosis not present

## 2018-04-22 DIAGNOSIS — S80921D Unspecified superficial injury of right lower leg, subsequent encounter: Secondary | ICD-10-CM | POA: Diagnosis not present

## 2018-04-26 DIAGNOSIS — C062 Malignant neoplasm of retromolar area: Secondary | ICD-10-CM | POA: Diagnosis not present

## 2018-04-26 DIAGNOSIS — E43 Unspecified severe protein-calorie malnutrition: Secondary | ICD-10-CM | POA: Diagnosis not present

## 2018-04-26 DIAGNOSIS — I872 Venous insufficiency (chronic) (peripheral): Secondary | ICD-10-CM | POA: Diagnosis not present

## 2018-04-26 DIAGNOSIS — D630 Anemia in neoplastic disease: Secondary | ICD-10-CM | POA: Diagnosis not present

## 2018-04-26 DIAGNOSIS — S80921D Unspecified superficial injury of right lower leg, subsequent encounter: Secondary | ICD-10-CM | POA: Diagnosis not present

## 2018-04-26 DIAGNOSIS — C9111 Chronic lymphocytic leukemia of B-cell type in remission: Secondary | ICD-10-CM | POA: Diagnosis not present

## 2018-04-27 DIAGNOSIS — C9111 Chronic lymphocytic leukemia of B-cell type in remission: Secondary | ICD-10-CM | POA: Diagnosis not present

## 2018-04-27 DIAGNOSIS — S80921D Unspecified superficial injury of right lower leg, subsequent encounter: Secondary | ICD-10-CM | POA: Diagnosis not present

## 2018-04-27 DIAGNOSIS — E43 Unspecified severe protein-calorie malnutrition: Secondary | ICD-10-CM | POA: Diagnosis not present

## 2018-04-27 DIAGNOSIS — D630 Anemia in neoplastic disease: Secondary | ICD-10-CM | POA: Diagnosis not present

## 2018-04-27 DIAGNOSIS — I872 Venous insufficiency (chronic) (peripheral): Secondary | ICD-10-CM | POA: Diagnosis not present

## 2018-04-27 DIAGNOSIS — C062 Malignant neoplasm of retromolar area: Secondary | ICD-10-CM | POA: Diagnosis not present

## 2018-04-29 DIAGNOSIS — C9111 Chronic lymphocytic leukemia of B-cell type in remission: Secondary | ICD-10-CM | POA: Diagnosis not present

## 2018-04-29 DIAGNOSIS — S80921D Unspecified superficial injury of right lower leg, subsequent encounter: Secondary | ICD-10-CM | POA: Diagnosis not present

## 2018-04-29 DIAGNOSIS — I872 Venous insufficiency (chronic) (peripheral): Secondary | ICD-10-CM | POA: Diagnosis not present

## 2018-04-29 DIAGNOSIS — E43 Unspecified severe protein-calorie malnutrition: Secondary | ICD-10-CM | POA: Diagnosis not present

## 2018-04-29 DIAGNOSIS — D630 Anemia in neoplastic disease: Secondary | ICD-10-CM | POA: Diagnosis not present

## 2018-04-29 DIAGNOSIS — C062 Malignant neoplasm of retromolar area: Secondary | ICD-10-CM | POA: Diagnosis not present

## 2018-04-30 DIAGNOSIS — D630 Anemia in neoplastic disease: Secondary | ICD-10-CM | POA: Diagnosis not present

## 2018-04-30 DIAGNOSIS — S80921D Unspecified superficial injury of right lower leg, subsequent encounter: Secondary | ICD-10-CM | POA: Diagnosis not present

## 2018-04-30 DIAGNOSIS — C9111 Chronic lymphocytic leukemia of B-cell type in remission: Secondary | ICD-10-CM | POA: Diagnosis not present

## 2018-04-30 DIAGNOSIS — C062 Malignant neoplasm of retromolar area: Secondary | ICD-10-CM | POA: Diagnosis not present

## 2018-04-30 DIAGNOSIS — E43 Unspecified severe protein-calorie malnutrition: Secondary | ICD-10-CM | POA: Diagnosis not present

## 2018-04-30 DIAGNOSIS — I872 Venous insufficiency (chronic) (peripheral): Secondary | ICD-10-CM | POA: Diagnosis not present

## 2018-05-04 DIAGNOSIS — C062 Malignant neoplasm of retromolar area: Secondary | ICD-10-CM | POA: Diagnosis not present

## 2018-05-04 DIAGNOSIS — E43 Unspecified severe protein-calorie malnutrition: Secondary | ICD-10-CM | POA: Diagnosis not present

## 2018-05-04 DIAGNOSIS — D630 Anemia in neoplastic disease: Secondary | ICD-10-CM | POA: Diagnosis not present

## 2018-05-04 DIAGNOSIS — C9111 Chronic lymphocytic leukemia of B-cell type in remission: Secondary | ICD-10-CM | POA: Diagnosis not present

## 2018-05-04 DIAGNOSIS — I872 Venous insufficiency (chronic) (peripheral): Secondary | ICD-10-CM | POA: Diagnosis not present

## 2018-05-04 DIAGNOSIS — S80921D Unspecified superficial injury of right lower leg, subsequent encounter: Secondary | ICD-10-CM | POA: Diagnosis not present

## 2018-05-06 DIAGNOSIS — D630 Anemia in neoplastic disease: Secondary | ICD-10-CM | POA: Diagnosis not present

## 2018-05-06 DIAGNOSIS — C062 Malignant neoplasm of retromolar area: Secondary | ICD-10-CM | POA: Diagnosis not present

## 2018-05-06 DIAGNOSIS — I872 Venous insufficiency (chronic) (peripheral): Secondary | ICD-10-CM | POA: Diagnosis not present

## 2018-05-06 DIAGNOSIS — E43 Unspecified severe protein-calorie malnutrition: Secondary | ICD-10-CM | POA: Diagnosis not present

## 2018-05-06 DIAGNOSIS — C9111 Chronic lymphocytic leukemia of B-cell type in remission: Secondary | ICD-10-CM | POA: Diagnosis not present

## 2018-05-06 DIAGNOSIS — S80921D Unspecified superficial injury of right lower leg, subsequent encounter: Secondary | ICD-10-CM | POA: Diagnosis not present

## 2018-05-07 ENCOUNTER — Encounter

## 2018-05-07 ENCOUNTER — Ambulatory Visit (INDEPENDENT_AMBULATORY_CARE_PROVIDER_SITE_OTHER): Payer: Medicare Other | Admitting: Family Medicine

## 2018-05-07 ENCOUNTER — Encounter: Payer: Self-pay | Admitting: Family Medicine

## 2018-05-07 VITALS — BP 122/80 | HR 70 | Temp 98.0°F | Wt 128.4 lb

## 2018-05-07 DIAGNOSIS — I959 Hypotension, unspecified: Secondary | ICD-10-CM | POA: Diagnosis not present

## 2018-05-07 DIAGNOSIS — K5900 Constipation, unspecified: Secondary | ICD-10-CM | POA: Diagnosis not present

## 2018-05-07 DIAGNOSIS — R531 Weakness: Secondary | ICD-10-CM | POA: Diagnosis not present

## 2018-05-07 DIAGNOSIS — C9110 Chronic lymphocytic leukemia of B-cell type not having achieved remission: Secondary | ICD-10-CM | POA: Diagnosis not present

## 2018-05-07 DIAGNOSIS — B351 Tinea unguium: Secondary | ICD-10-CM | POA: Diagnosis not present

## 2018-05-07 DIAGNOSIS — E43 Unspecified severe protein-calorie malnutrition: Secondary | ICD-10-CM | POA: Diagnosis not present

## 2018-05-07 DIAGNOSIS — R600 Localized edema: Secondary | ICD-10-CM | POA: Diagnosis not present

## 2018-05-07 DIAGNOSIS — R627 Adult failure to thrive: Secondary | ICD-10-CM

## 2018-05-07 NOTE — Patient Instructions (Signed)
Add back stool softener. Consider miralax to help with stools.

## 2018-05-07 NOTE — Progress Notes (Signed)
Timothy Kim DOB: 01/24/1930 Encounter date: 05/07/2018  This is a 84 y.o. male who presents to establish care. Chief Complaint  Patient presents with  . Transitions Of Care    no new concerns   History of present illness: Recent hospital admission 03/21/18-03/25/18: recurrent syncope with fall secondary to chronic hypotension on midodrine and complicated by SCC throat s/p radical neck dissection/radiation complicated by FTT due to poor PO and CLL. Was discharged to SNF and nutrition/palliative care consulted. (not hospice candidate due to desire for full scope of medical care although he is a DNR)  [facility] sent to physical rehab center and was there for ten days. Has been home for last month or so. Hated the rehab facility.   Now that he is back at home he does have home care visits by [facility]. Nurse has been out about once a week and has also see PT and OT and SW. Does not want to do therapy. Feels it is a waste of money. Can do exercises on his own but not always motivated to do so.   Has such issues with chewing secondary to partial jaw/tooth removal due to cancer. Does a bottle of ensure, yogurt, soup (tries to get soups with meat). Plans to get bananas to make smoothies. Will mix yogurt with this. Has protein powder which he will mix in.   Does not have pain and does not take any medication for pain. Back only hurts with standing. He is comfortable and without pain most of the day.   Does get very tired.  Prior to recent syncope; he was light headed 6 weeks prior. Has had 7 falls in 2.5 years. Not having any issues with light headedness or dizziness now.   He states he is able to get around house ok with walker. He knows his medical health is declining along with functional status. He is not in any pain and feels that this is all a part of life. He states he is holding on to life at this point. He does not with to have therapy in home at this time (once a week is plenty but he feels that  they don't really do much). He says that he is all set with assistive type equipment around the house (ie tools for showering, toileting) and feels he does fine with his ADL and AIDLs.    Past Medical History:  Diagnosis Date  . Alcohol abuse   . Allergic rhinitis   . Anemia in neoplastic disease    chronic  . Bilateral lower extremity edema    feet  . Borderline diabetes   . BPH (benign prostatic hypertrophy)   . CLL (chronic lymphocytic leukemia) [facility]) oncologist-  dr Heath Lark ([facility])   dx Jan 2014 --- Stage 1--  currently asymptomatic as of Apr 2016 and No active disease  . DDD (degenerative disc disease), cervical   . DDD (degenerative disc disease), lumbosacral   . 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 Dysphasia functional--  speech therapy   post op  radical neck dissection 07-21-2014--  changed diet to no chewy food , softer foods , states with the changes swallowed okay without any issues  . Glaucoma    both eyes  . History of gastroesophageal reflux (GERD)   . History of kidney stones  right side--  [facility] 01-17-2015 non-obstructing  . History of radiation therapy    09-04-2014 to 10-13-2014  Right retromolar region (tumor bed) and right neck/  60Gy in 30 fractions to tumor bed and 54Gy in 30 fractions to neck  . History of radiation to head and neck region 09-04-2014 to  10-13-2014   right retromolar region and right neck  60 Gy  . History of syncope    a. Event monitor 11/17: No evidence of significant AVB or > 3 second pause.; plan ILR if recurrent syncope in the future  . History of tracheostomy    post op radical neck dissection  07-21-2014--  post op tracheostomy on 07-25-2014  decannulated and sutured 07-29-2014  . Hypertension   . Macular  degeneration    right eye only  . MRSA (methicillin resistant staph aureus) culture positive   . OA (osteoarthritis)   . Organic impotence   . Pneumonia 12/2015  . Pulmonary nodule    benign  and stable per last CT  . Radiation-induced dermatitis   . Squamous cell carcinoma of retromolar trigone [facility]) oncologist-  dr Isidore Moos   Stage IVB, pT1b, pN0, Grade 2, +PNI, no LVSI---  S/P  RADICAL NECK DISSECTION AND RADIATION  . Thoracic ascending aortic aneurysm ([place]) 03/28/2016   Chest CTA 9/17: 4.6 cm fusiform ascending thoracic aortic aneurysm // Chest CTA 4/18: Ascending thoracic aortic aneurysm 4.2 cm >> repeat 1 year  . 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 Transitional cell carcinoma of bladder [facility] urologist-- dr Junious Silk   High - grade  . Venous insufficiency    Past Surgical History:  Procedure Laterality Date  . CATARACT EXTRACTION W/ INTRAOCULAR LENS  IMPLANT, BILATERAL    . COLONOSCOPY N/A 07/23/2016   Procedure: COLONOSCOPY;  Surgeon: Wilford Corner, MD;  Location: [facility] ENDOSCOPY;  Service: Endoscopy;  Laterality: N/A;  . CYSTOSCOPY N/A 02/02/2015   Procedure: CYSTOSCOPY, INSTILLATION OF MITOMYCIN C;  Surgeon: Lowella Bandy, MD;  Location[facility];  Service: Urology;  Laterality: N/A;  . CYSTOSCOPY WITH BIOPSY N/A 03/27/2015   Procedure: CYSTOSCOPY WITH BLADDER  BIOPSY WITH FULGERATION;  Surgeon: Festus Aloe, MD;  [facility];  Service: Urology;  Laterality: N/A;  . ESOPHAGOGASTRODUODENOSCOPY N/A 07/23/2016   Procedure: ESOPHAGOGASTRODUODENOSCOPY (EGD);  Surgeon: Wilford Corner, MD;  Location: [facility] ENDOSCOPY;  Service: Endoscopy;  Laterality: N/A;  . INGUINAL HERNIA REPAIR Bilateral right 1988//  left 1970's  . LAMINECTOMY WITH POSTERIOR LATERAL ARTHRODESIS LEVEL 1  09-01-2003   L3 -5 LAMINECTOMY W/ DECOMPRESSION AND FUSION L4-5  . MAXILLECTOMY Right 07/21/14,   [facility]   Radical Neck  Dissection, Maxillectomy, Right Marginal Mandibulectomy, dental extractions, Parascauplar fasciocutaeous Free flap reconstruction  . RETINAL DETACHMENT REPAIR W/ SCLERAL BUCKLE LE  11-03-2000  . ROTATOR CUFF REPAIR Right 12-11-2000  . tonsil biopsy Right 05/24/14  . TRANSURETHRAL RESECTION OF BLADDER TUMOR N/A 02/02/2015   Procedure: TRANSURETHRAL RESECTION OF BLADDER TUMOR (TURBT);  Surgeon: Lowella Bandy, MD;  Location: [facility];  Service: Urology;  Laterality: N/A;   No Known Allergies Current Meds  Medication Sig  . brimonidine-timolol (COMBIGAN) 0.2-0.5 % ophthalmic solution Place 1 drop into both eyes 2 (two) times daily.   . feeding supplement, ENSURE ENLIVE, (ENSURE ENLIVE) LIQD Take 237 mLs by mouth 2 (two) times daily between meals.  . latanoprost (XALATAN) 0.005 % ophthalmic solution Place 1 drop  into both eyes every evening.   . midodrine (PROAMATINE) 10 MG tablet TAKE 1 TABLET 3 TIMES A DAY WITH MEALS  . Multiple Vitamin (MULTIVITAMIN WITH MINERALS) TABS tablet Take 1 tablet by mouth daily.  . RHOPRESSA 0.02 % SOLN Place 1 drop into the right eye daily.   Social History   Tobacco Use  . Smoking status: Never Smoker  . Smokeless tobacco: Never Used  Substance Use Topics  . Alcohol use: Yes    Comment: vodka 3-4 daily   (Hx alcohol withdrawal )   Family History  Problem Relation Age of Onset  . Cancer Mother        lung ca  . Alcohol abuse Sister   . Hypertension Neg Hx        family hx  . Sudden death Neg Hx        famiylhx  . Heart attack Neg Hx      Review of Systems  Constitutional: Negative for chills, fatigue and fever.  HENT: Positive for dental problem and trouble swallowing.        Trouble chewing  Respiratory: Negative for cough, chest tightness, shortness of breath and wheezing.   Cardiovascular: Positive for leg swelling. Negative for chest pain and palpitations.  Gastrointestinal: Positive for constipation (only having BM every 5 days  or so). Negative for abdominal pain, diarrhea, nausea and vomiting.  Musculoskeletal: Positive for back pain (just with standing).  Neurological: Positive for weakness.    Objective:  BP 122/80 (BP Location: Left Arm, Patient Position: Sitting, Cuff Size: Normal)   Pulse 70   Temp 98 F (36.7 C) (Oral)   Wt 128 lb 6.4 oz (58.2 kg)   SpO2 95%   BMI 20.11 kg/m   Weight: 128 lb 6.4 oz (58.2 kg)   BP Readings from Last 3 Encounters:  05/07/18 122/80  04/05/18 120/74  03/25/18 111/68   Wt Readings from Last 3 Encounters:  05/07/18 128 lb 6.4 oz (58.2 kg)  04/05/18 110 lb (49.9 kg)  03/21/18 110 lb 14.3 oz (50.3 kg)    Physical Exam  Constitutional: Vital signs are normal. He appears cachectic. He has a sickly appearance.  HENT:  Facial deformity secondary to surgery (right jaw)  Cardiovascular: Normal rate, regular rhythm and normal heart sounds.  There is 1+ edema bilat LE  Pulmonary/Chest: Effort normal and breath sounds normal.  Skin:  Scaling, papular lesions forehead.  Left hand large nodule central keratin appearing core; likely consistent with squamous cell.   Toenails are yellowed and thickened and curl underneath his toes/making contact with plantar surface. No noted skin infection/irritation.   Psychiatric: His speech is normal and behavior is normal. Thought content normal. Cognition and memory are normal.    Assessment/Plan: 1.hypotension: currently stable. Continue midodrine.   2. Onychomycosis He is unable to cut his own toenails; discussed risk of skin infection and benefits of trimming for comfort with standing/walking. - Ambulatory referral to Podiatry  3. Constipation, unspecified constipation type Discussed adding stool softener and or miralax to help with bowel regularity. Let me know if this is not helping.  4. Severe protein-calorie malnutrition ([place]), BMI 17 He is going to work on drinking smoothies and adding his protein powder. We discussed  using greek yogurt in smoothies for higher protein content. Continue with boost/ensure.  5. CLL (chronic lymphocytic leukemia) ([place]) He does follow with oncology. Anemia has been stable.  6. Weakness generalized He is planning to keep working on increasing protein intake. We  also discussed working on home exercises to maintain strength. He will try to do this.  7. Bilateral leg edema Improved from previous notes. Discussed that protein status and decreased mobility are contributing. Will monitor. Would benefit from compression stockings but would be difficult for him/significant other to put on.  8. Failure to thrive in adult He is still working to increase protein intake; see above.   Will continue home care. Encouraged him to let me know if feeling like he needs more assistance in the home (ie with self care or mobility) as I am happy to help facilitate.   Return as needed; happy to have them schedule a 1 month to touch base if desired but otherwise 3 month .  Micheline Rough, MD

## 2018-05-11 ENCOUNTER — Other Ambulatory Visit: Payer: Self-pay | Admitting: Dermatology

## 2018-05-11 DIAGNOSIS — D0439 Carcinoma in situ of skin of other parts of face: Secondary | ICD-10-CM | POA: Diagnosis not present

## 2018-05-11 DIAGNOSIS — I872 Venous insufficiency (chronic) (peripheral): Secondary | ICD-10-CM | POA: Diagnosis not present

## 2018-05-11 DIAGNOSIS — D630 Anemia in neoplastic disease: Secondary | ICD-10-CM | POA: Diagnosis not present

## 2018-05-11 DIAGNOSIS — S80921D Unspecified superficial injury of right lower leg, subsequent encounter: Secondary | ICD-10-CM | POA: Diagnosis not present

## 2018-05-11 DIAGNOSIS — C9111 Chronic lymphocytic leukemia of B-cell type in remission: Secondary | ICD-10-CM | POA: Diagnosis not present

## 2018-05-11 DIAGNOSIS — E43 Unspecified severe protein-calorie malnutrition: Secondary | ICD-10-CM | POA: Diagnosis not present

## 2018-05-11 DIAGNOSIS — C44629 Squamous cell carcinoma of skin of left upper limb, including shoulder: Secondary | ICD-10-CM | POA: Diagnosis not present

## 2018-05-11 DIAGNOSIS — C062 Malignant neoplasm of retromolar area: Secondary | ICD-10-CM | POA: Diagnosis not present

## 2018-05-14 DIAGNOSIS — E43 Unspecified severe protein-calorie malnutrition: Secondary | ICD-10-CM | POA: Diagnosis not present

## 2018-05-14 DIAGNOSIS — C062 Malignant neoplasm of retromolar area: Secondary | ICD-10-CM | POA: Diagnosis not present

## 2018-05-14 DIAGNOSIS — S80921D Unspecified superficial injury of right lower leg, subsequent encounter: Secondary | ICD-10-CM | POA: Diagnosis not present

## 2018-05-14 DIAGNOSIS — C9111 Chronic lymphocytic leukemia of B-cell type in remission: Secondary | ICD-10-CM | POA: Diagnosis not present

## 2018-05-14 DIAGNOSIS — D630 Anemia in neoplastic disease: Secondary | ICD-10-CM | POA: Diagnosis not present

## 2018-05-14 DIAGNOSIS — I872 Venous insufficiency (chronic) (peripheral): Secondary | ICD-10-CM | POA: Diagnosis not present

## 2018-05-17 ENCOUNTER — Ambulatory Visit: Payer: Self-pay

## 2018-05-17 DIAGNOSIS — C9111 Chronic lymphocytic leukemia of B-cell type in remission: Secondary | ICD-10-CM | POA: Diagnosis not present

## 2018-05-17 DIAGNOSIS — C062 Malignant neoplasm of retromolar area: Secondary | ICD-10-CM | POA: Diagnosis not present

## 2018-05-17 DIAGNOSIS — S80921D Unspecified superficial injury of right lower leg, subsequent encounter: Secondary | ICD-10-CM | POA: Diagnosis not present

## 2018-05-17 DIAGNOSIS — E43 Unspecified severe protein-calorie malnutrition: Secondary | ICD-10-CM | POA: Diagnosis not present

## 2018-05-17 DIAGNOSIS — D630 Anemia in neoplastic disease: Secondary | ICD-10-CM | POA: Diagnosis not present

## 2018-05-17 DIAGNOSIS — I872 Venous insufficiency (chronic) (peripheral): Secondary | ICD-10-CM | POA: Diagnosis not present

## 2018-05-17 NOTE — Telephone Encounter (Signed)
Don OT with [facility] called to report pt. Weight . On 05/06/18 it was 118. Today it was 123 lbs. Last weight in pt. Chart was on 05/07/18 weight was 128 lbs. No symptoms. Will continue to monitor pt.

## 2018-05-17 NOTE — Telephone Encounter (Signed)
Would recommend repeat weight tomorrow.

## 2018-05-17 NOTE — Telephone Encounter (Signed)
Notes. Patients weight is being monitored.

## 2018-05-18 DIAGNOSIS — I872 Venous insufficiency (chronic) (peripheral): Secondary | ICD-10-CM | POA: Diagnosis not present

## 2018-05-18 DIAGNOSIS — C9111 Chronic lymphocytic leukemia of B-cell type in remission: Secondary | ICD-10-CM | POA: Diagnosis not present

## 2018-05-18 DIAGNOSIS — S80921D Unspecified superficial injury of right lower leg, subsequent encounter: Secondary | ICD-10-CM | POA: Diagnosis not present

## 2018-05-18 DIAGNOSIS — C062 Malignant neoplasm of retromolar area: Secondary | ICD-10-CM | POA: Diagnosis not present

## 2018-05-18 DIAGNOSIS — D630 Anemia in neoplastic disease: Secondary | ICD-10-CM | POA: Diagnosis not present

## 2018-05-18 DIAGNOSIS — E43 Unspecified severe protein-calorie malnutrition: Secondary | ICD-10-CM | POA: Diagnosis not present

## 2018-05-24 ENCOUNTER — Ambulatory Visit (INDEPENDENT_AMBULATORY_CARE_PROVIDER_SITE_OTHER): Payer: Medicare Other | Admitting: Podiatry

## 2018-05-24 ENCOUNTER — Encounter: Payer: Self-pay | Admitting: Podiatry

## 2018-05-24 VITALS — BP 156/91 | HR 67

## 2018-05-24 DIAGNOSIS — B351 Tinea unguium: Secondary | ICD-10-CM | POA: Diagnosis not present

## 2018-05-24 DIAGNOSIS — M79674 Pain in right toe(s): Secondary | ICD-10-CM

## 2018-05-24 DIAGNOSIS — R600 Localized edema: Secondary | ICD-10-CM | POA: Diagnosis not present

## 2018-05-24 DIAGNOSIS — M79675 Pain in left toe(s): Secondary | ICD-10-CM

## 2018-05-24 NOTE — Patient Instructions (Addendum)
PLEASE SOAK FEET IN WARM SOAPY WATER ONCE WEEKLY. DRY FEET WELL AND APPLY MOISTURIZING LOTION.  Onychomycosis/Fungal Toenails  WHAT IS IT? An infection that lies within the keratin of your nail plate that is caused by a fungus.  WHY ME? Fungal infections affect all ages, sexes, races, and creeds.  There may be many factors that predispose you to a fungal infection such as age, coexisting medical conditions such as diabetes, or an autoimmune disease; stress, medications, fatigue, genetics, etc.  Bottom line: fungus thrives in a warm, moist environment and your shoes offer such a location.  IS IT CONTAGIOUS? Theoretically, yes.  You do not want to share shoes, nail clippers or files with someone who has fungal toenails.  Walking around barefoot in the same room or sleeping in the same bed is unlikely to transfer the organism.  It is important to realize, however, that fungus can spread easily from one nail to the next on the same foot.  HOW DO WE TREAT THIS?  There are several ways to treat this condition.  Treatment may depend on many factors such as age, medications, pregnancy, liver and kidney conditions, etc.  It is best to ask your doctor which options are available to you.  1. No treatment.   Unlike many other medical concerns, you can live with this condition.  However for many people this can be a painful condition and may lead to ingrown toenails or a bacterial infection.  It is recommended that you keep the nails cut short to help reduce the amount of fungal nail. 2. Topical treatment.  These range from herbal remedies to prescription strength nail lacquers.  About 40-50% effective, topicals require twice daily application for approximately 9 to 12 months or until an entirely new nail has grown out.  The most effective topicals are medical grade medications available through physicians offices. 3. Oral antifungal medications.  With an 80-90% cure rate, the most common oral medication requires 3  to 4 months of therapy and stays in your system for a year as the new nail grows out.  Oral antifungal medications do require blood work to make sure it is a safe drug for you.  A liver function panel will be performed prior to starting the medication and after the first month of treatment.  It is important to have the blood work performed to avoid any harmful side effects.  In general, this medication safe but blood work is required. 4. Laser Therapy.  This treatment is performed by applying a specialized laser to the affected nail plate.  This therapy is noninvasive, fast, and non-painful.  It is not covered by insurance and is therefore, out of pocket.  The results have been very good with a 80-95% cure rate.  [facility] is the only practice in the area to offer this therapy. 5. Permanent Nail Avulsion.  Removing the entire nail so that a new nail will not grow back.

## 2018-05-25 ENCOUNTER — Telehealth: Payer: Self-pay | Admitting: Family Medicine

## 2018-05-25 ENCOUNTER — Encounter: Payer: Self-pay | Admitting: Podiatry

## 2018-05-25 ENCOUNTER — Telehealth: Payer: Self-pay | Admitting: Podiatry

## 2018-05-25 ENCOUNTER — Telehealth: Payer: Self-pay | Admitting: *Deleted

## 2018-05-25 DIAGNOSIS — C9111 Chronic lymphocytic leukemia of B-cell type in remission: Secondary | ICD-10-CM | POA: Diagnosis not present

## 2018-05-25 DIAGNOSIS — S80921D Unspecified superficial injury of right lower leg, subsequent encounter: Secondary | ICD-10-CM | POA: Diagnosis not present

## 2018-05-25 DIAGNOSIS — C062 Malignant neoplasm of retromolar area: Secondary | ICD-10-CM | POA: Diagnosis not present

## 2018-05-25 DIAGNOSIS — E43 Unspecified severe protein-calorie malnutrition: Secondary | ICD-10-CM | POA: Diagnosis not present

## 2018-05-25 DIAGNOSIS — D630 Anemia in neoplastic disease: Secondary | ICD-10-CM | POA: Diagnosis not present

## 2018-05-25 DIAGNOSIS — I872 Venous insufficiency (chronic) (peripheral): Secondary | ICD-10-CM | POA: Diagnosis not present

## 2018-05-25 NOTE — Telephone Encounter (Signed)
[facility] calling to inform us that pt is being discharged. Pt has insisted he does not want any more therapy and is demanding discharge. Please give them a call to confirm this info was received.

## 2018-05-25 NOTE — Telephone Encounter (Signed)
-----   Message from Marzetta Board, DPM sent at 05/25/2018  8:14 AM EST ----- Val,  Patient states home health comes in and bathes him, but they don't clean his feet.  Can we send orders to Mr. Bown's home health company for foot soaks once weekly? See my plan in yesterday's note.  Thanks!

## 2018-05-25 NOTE — Telephone Encounter (Signed)
I called pt, spoke with Bill Salinas, she states pt uses [facility] home health care.

## 2018-05-25 NOTE — Telephone Encounter (Signed)
Copied from [place] [id number]. Topic: Quick Communication - Home Health Verbal Orders >> May 25, 2018  1:16 PM Conception Chancy, NT wrote: Caller/Agency: [facility] Number: [phone] Requesting OT/PT/Skilled Nursing/Social Work: PT Frequency: patient refused PT and wants to be discharged.

## 2018-05-25 NOTE — Telephone Encounter (Signed)
Timothy Kim - [facility] states pt has been discharged from their care.

## 2018-05-25 NOTE — Progress Notes (Signed)
Subjective: Timothy Kim presents accompanied by his caretaker on today.  He was referred by his primary care physician, Dr. Micheline Rough,  today with painful, thick toenails 1-5 b/l that hecannot cut and which interfere with daily activities.  Pain is aggravated when wearing enclosed shoe gear.  Caregiver states he is supposed to wear compression stockings but he does not.  Past Medical History:  Diagnosis Date  . Alcohol abuse   . Allergic rhinitis   . Anemia in neoplastic disease    chronic  . Bilateral lower extremity edema    feet  . Borderline diabetes   . BPH (benign prostatic hypertrophy)   . CLL (chronic lymphocytic leukemia) [facility]) oncologist-  dr Heath Lark ([facility])   dx Jan 2014 --- Stage 1--  currently asymptomatic as of Apr 2016 and No active disease  . DDD (degenerative disc disease), cervical   . DDD (degenerative disc disease), lumbosacral   . 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 Dysphasia functional--  speech therapy   post op  radical neck dissection 07-21-2014--  changed diet to no chewy food , softer foods , states with the changes swallowed okay without any issues  . Glaucoma    both eyes  . History of gastroesophageal reflux (GERD)   . History of kidney stones    right side--  per CT from [facility] 01-17-2015 non-obstructing  . History of radiation therapy    09-04-2014 to 10-13-2014  Right retromolar region (tumor bed) and right neck/  60Gy in 30 fractions to tumor bed and 54Gy in 30 fractions to neck  . History of radiation to head and neck region 09-04-2014 to  10-13-2014   right retromolar region and right neck  60 Gy  . History of syncope    a. Event monitor 11/17: No evidence of significant AVB or > 3 second pause.; plan ILR if recurrent syncope in  the future  . History of tracheostomy    post op radical neck dissection  07-21-2014--  post op tracheostomy on 07-25-2014  decannulated and sutured 07-29-2014  . Hypertension   . Macular degeneration    right eye only  . MRSA (methicillin resistant staph aureus) culture positive   . OA (osteoarthritis)   . Organic impotence   . Pneumonia 12/2015  . Pulmonary nodule    benign  and stable per last CT  . Radiation-induced dermatitis   . 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 Thoracic ascending aortic aneurysm ([place]) 03/28/2016   Chest CTA 9/17: 4.6 cm fusiform ascending thoracic aortic aneurysm // Chest CTA 4/18: Ascending thoracic aortic aneurysm 4.2 cm >> repeat 1 year  . 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 Transitional cell carcinoma of bladder [facility]) urologist-- dr Junious Silk   High - grade  . Venous insufficiency    Patient Active Problem List   Diagnosis Date Noted  . Severe protein-calorie malnutrition ([place]), BMI 17 03/24/2018  . Decreased oral intake 03/24/2018  . Failure to thrive in adult 03/24/2018  . Encounter for palliative care   . Goals of care, counseling/discussion   . Fall at  home, initial encounter 03/21/2018  . Laceration of multiple sites of skin 10/20/2017  . Dizziness 12/17/2016  . Bilateral impacted cerumen 11/13/2016  . Esophageal stenosis 11/12/2016  . Dysphagia 10/16/2016  . Abdominal pain, generalized 07/23/2016  . Bilateral leg edema 03/28/2016  . Thoracic ascending aortic aneurysm ([place]) 03/28/2016  . Syncope 03/19/2016  . Weakness generalized 03/19/2016  . Macrocytic anemia 03/19/2016  . Acquired hypothyroidism 03/11/2016  . Eustachian tube dysfunction, right 03/11/2016  . Facial swelling 03/11/2016  . Presbycusis of both ears 03/11/2016  . Drug-induced hypokalemia  10/16/2015  . History of bladder cancer 02/08/2015  . Malignant neoplasm of urinary bladder ([place]) 02/08/2015  . Anemia in neoplastic disease 10/17/2014  . 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 Heavy alcohol consumption 07/19/2014  . Potential difficult airway on pre-intubation assessment 07/19/2014  . Surgery, other elective 07/19/2014  . Alcohol abuse 06/28/2014  . 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 Abnormal weight loss 05/31/2014  . Cancer of the lip, oral cavity, and pharynx ([place]) 05/30/2014  . CLL (chronic lymphocytic leukemia) ([place]) 07/07/2012  . Glaucoma 05/13/2011  . 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 Macular degeneration 05/13/2011  . ALLERGIC RHINITIS 02/09/2008  . 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 Osteoarthritis 06/09/2007  . LOW BACK PAIN 06/09/2007  . GERD 03/03/2007  . BPH (benign prostatic hyperplasia) 03/03/2007   Past Surgical History:  Procedure Laterality Date  . BACK SURGERY    . CATARACT EXTRACTION W/ INTRAOCULAR LENS  IMPLANT, BILATERAL    . COLONOSCOPY N/A 07/23/2016   Procedure: COLONOSCOPY;  Surgeon: Wilford Corner, MD;  Location: [facility] ENDOSCOPY;  Service: Endoscopy;  Laterality: N/A;  . CYSTOSCOPY N/A 02/02/2015   Procedure: CYSTOSCOPY, INSTILLATION OF MITOMYCIN C;  Surgeon: Lowella Bandy, MD;  Location: [facility];  Service: Urology;  Laterality: N/A;  . CYSTOSCOPY WITH BIOPSY N/A 03/27/2015   Procedure: CYSTOSCOPY WITH BLADDER  BIOPSY WITH FULGERATION;  Surgeon: Festus Aloe, MD;  Location: [facility];  Service: Urology;  Laterality: N/A;  . ESOPHAGOGASTRODUODENOSCOPY N/A 07/23/2016   Procedure: ESOPHAGOGASTRODUODENOSCOPY (EGD);  Surgeon: Wilford Corner, MD;  Location[facility] ENDOSCOPY;  Service: Endoscopy;  Laterality: N/A;  . excision of bladder tumors    . exise squamous cell carcinoma    . EYE SURGERY Bilateral   . HERNIA REPAIR N/A   . INGUINAL HERNIA REPAIR Bilateral right 1988//  left 1970's   . LAMINECTOMY WITH POSTERIOR LATERAL ARTHRODESIS LEVEL 1  09-01-2003   L3 -5 LAMINECTOMY W/ DECOMPRESSION AND FUSION L4-5  . MAXILLECTOMY Right 07/21/14,   [facility]   Radical Neck Dissection, Maxillectomy, Right Marginal Mandibulectomy, dental extractions, Parascauplar fasciocutaeous Free flap reconstruction  . RETINAL DETACHMENT REPAIR W/ SCLERAL BUCKLE LE  11-03-2000  . ROTATOR CUFF REPAIR Right 12-11-2000  . tonsil biopsy Right 05/24/14  . torn rotor cuff repair Right    right shoulder  . TRANSURETHRAL RESECTION OF BLADDER TUMOR N/A 02/02/2015   Procedure: TRANSURETHRAL RESECTION OF BLADDER TUMOR (TURBT);  Surgeon: Lowella Bandy, MD;  Location: [facility];  Service: Urology;  Laterality: N/A;   Current Outpatient Medications:  .  brimonidine-timolol (COMBIGAN) 0.2-0.5 % ophthalmic solution, Place 1 drop into both eyes 2 (two) times daily. , Disp: , Rfl:  .  feeding supplement, ENSURE ENLIVE, (ENSURE ENLIVE) LIQD, Take 237 mLs by mouth 2 (two) times daily between meals., Disp: 237 mL, Rfl: 12 .  latanoprost (XALATAN) 0.005 % ophthalmic solution, Place 1 drop into both eyes every evening. , Disp: ,  Rfl: 4 .  midodrine (PROAMATINE) 10 MG tablet, TAKE 1 TABLET 3 TIMES A DAY WITH MEALS, Disp: 270 tablet, Rfl: 1 .  Multiple Vitamin (MULTIVITAMIN WITH MINERALS) TABS tablet, Take 1 tablet by mouth daily., Disp: 30 tablet, Rfl: 11 .  RHOPRESSA 0.02 % SOLN, Place 1 drop into the right eye daily., Disp: , Rfl:     No Known Allergies   Social History   Occupational History  . Not on file  Tobacco Use  . Smoking status: Never Smoker  . Smokeless tobacco: Never Used  Substance and Sexual Activity  . Alcohol use: Yes    Comment: vodka 3-4 daily   (Hx alcohol withdrawal )  . Drug use: No  . Sexual activity: Not on file   Family History  Problem Relation Age of Onset  . Cancer Mother        lung ca  . Alcohol abuse Sister   . Hypertension Neg Hx        family hx  . Sudden death  Neg Hx        famiylhx  . Heart attack Neg Hx    Objective: Vitals:   05/24/18 1531  BP: (!) 156/91  Pulse: 67   Vascular Examination: Capillary refill time less than 3 seconds to all 10 digits.  Dorsalis pedis pulses are palpable bilaterally.  I am unable to palpate posterior tibial pulses due to +1 edema of ankles.  Digital hair is absent to all 10 digits.  Skin temperature gradient is within normal limits bilaterally.   Dermatological Examination: Skin with normal age-related atrophy.  There is normal texture.  Toenails 1-5 b/l are moderately elongated, discolored, thick, dystrophic with subungual debris and pain with palpation to nailbeds due to thickness of nails.  There is evidence of poor pedal hygiene with debris noted interdigitally.  Musculoskeletal: Muscle strength 5/5 to all LE muscle groups  Neurological: Sensation diminished with 10 gram monofilament.   Assessment: Painful onychomycosis toenails 1-5 b/l   Plan: 1. Toenails 1-5 b/l were debrided in length and girth without iatrogenic bleeding. 2. Regarding his pedal hygiene, we discussed his bathing practices.  He has a home health aide that comes in to give him a bath.  He relates his feet are not cleaned during that process.  I have instructed him and the caretaker to have his feet soaked once weekly and warm soapy water, dry his feet well and apply moisturizing lotion to the top and bottom of both feet.  Do not apply the lotion in between the toes.  If necessary we can send orders to the home health company with these instructions. 3. Patient to continue soft, supportive shoe gear 4. Patient to report any pedal injuries to medical professional immediately. 5. Follow up 3 months. Patient/POA to call should there be a concern in the interim.

## 2018-05-25 NOTE — Telephone Encounter (Signed)
Please advise 

## 2018-05-25 NOTE — Telephone Encounter (Signed)
Faxed Dr. Heber Bradley orders for pt to have weekly foot soaks in warm soapy water, rinse well, pat dry and apply lotion to the tops and bottoms of the feet, but not between the toes.

## 2018-05-25 NOTE — Telephone Encounter (Signed)
I informed Timothy Kim, we received their message and I would inform Dr. Elisha Ponder.

## 2018-05-26 NOTE — Telephone Encounter (Signed)
Please just check in with him. I am ok with doing what he would like. He is trying to manage complex/complicated medical history. I just want him to feel like he has all tools at disposal that he needs. If he no longer wishes to participate in PT (in the office patient thought he could do those exercises on his own at home) I am ok with this. If he feels that his needs aren't being met or has other concerns we can address let me know.

## 2018-05-26 NOTE — Telephone Encounter (Signed)
Spoke with patient he stated "I am in pretty bad shape. I am beyond help and they have done all they can for me." Patient stated he would like to discontinue therapy.

## 2018-06-04 DIAGNOSIS — C44629 Squamous cell carcinoma of skin of left upper limb, including shoulder: Secondary | ICD-10-CM | POA: Diagnosis not present

## 2018-08-23 ENCOUNTER — Encounter: Payer: Self-pay | Admitting: Podiatry

## 2018-08-23 ENCOUNTER — Ambulatory Visit (INDEPENDENT_AMBULATORY_CARE_PROVIDER_SITE_OTHER): Payer: Medicare Other | Admitting: Podiatry

## 2018-08-23 DIAGNOSIS — M79675 Pain in left toe(s): Secondary | ICD-10-CM

## 2018-08-23 DIAGNOSIS — B351 Tinea unguium: Secondary | ICD-10-CM

## 2018-08-23 DIAGNOSIS — M79674 Pain in right toe(s): Secondary | ICD-10-CM | POA: Diagnosis not present

## 2018-08-23 NOTE — Patient Instructions (Signed)

## 2018-08-29 NOTE — Progress Notes (Signed)
Subjective: Timothy Kim presents today with painful, thick toenails 1-5 b/l that he cannot cut and which interfere with daily activities.  Pain is aggravated when wearing enclosed shoe gear.  Caren Macadam, MD is his PCP.   Current Outpatient Medications:  .  brimonidine-timolol (COMBIGAN) 0.2-0.5 % ophthalmic solution, Place 1 drop into both eyes 2 (two) times daily. , Disp: , Rfl:  .  feeding supplement, ENSURE ENLIVE, (ENSURE ENLIVE) LIQD, Take 237 mLs by mouth 2 (two) times daily between meals., Disp: 237 mL, Rfl: 12 .  latanoprost (XALATAN) 0.005 % ophthalmic solution, Place 1 drop into both eyes every evening. , Disp: , Rfl: 4 .  midodrine (PROAMATINE) 10 MG tablet, TAKE 1 TABLET 3 TIMES A DAY WITH MEALS, Disp: 270 tablet, Rfl: 1 .  Multiple Vitamin (MULTIVITAMIN WITH MINERALS) TABS tablet, Take 1 tablet by mouth daily., Disp: 30 tablet, Rfl: 11 .  RHOPRESSA 0.02 % SOLN, Place 1 drop into the right eye daily., Disp: , Rfl:   No Known Allergies  Objective:  Vascular Examination: Capillary refill time <3 seconds x 10 digits.  Dorsalis pedis palpable b/l  Posterior tibial pulses palpable b/l  Digital hair absent x 10 digits.  Skin temperature gradient WNL b/l.  Dermatological Examination: Skin with age-related atrophy.   Normal texture b/l.   Toenails 1-5 b/l discolored, thick, dystrophic with subungual debris and pain with palpation to nailbeds due to thickness of nails.  Musculoskeletal: Muscle strength 5/5 to all LE muscle groups  Neurological: Sensation diminished with 10 gram monofilament.  Assessment: Painful onychomycosis toenails 1-5 b/l   Plan: 1. Toenails 1-5 b/l were debrided in length and girth without iatrogenic bleeding. 2. Patient to continue soft, supportive shoe gear 3. Patient to report any pedal injuries to medical professional immediately. 4. Follow up 3 months.  5. Patient/POA to call should there be a concern in the interim.

## 2018-09-07 ENCOUNTER — Other Ambulatory Visit: Payer: Self-pay | Admitting: Dermatology

## 2018-09-07 DIAGNOSIS — C44529 Squamous cell carcinoma of skin of other part of trunk: Secondary | ICD-10-CM | POA: Diagnosis not present

## 2018-09-10 DIAGNOSIS — M5136 Other intervertebral disc degeneration, lumbar region: Secondary | ICD-10-CM | POA: Insufficient documentation

## 2018-09-10 DIAGNOSIS — G894 Chronic pain syndrome: Secondary | ICD-10-CM | POA: Insufficient documentation

## 2018-09-10 DIAGNOSIS — M961 Postlaminectomy syndrome, not elsewhere classified: Secondary | ICD-10-CM | POA: Diagnosis not present

## 2018-09-21 ENCOUNTER — Telehealth: Payer: Self-pay

## 2018-09-21 NOTE — Telephone Encounter (Signed)
Patients last Ov was 05/07/18, he is due for a follow up. Patient does not have Internet access or a smart phone. He has been scheduled for a telephone visit with Dr. Ethlyn Gallery on 09/22/2018.

## 2018-09-22 ENCOUNTER — Ambulatory Visit (INDEPENDENT_AMBULATORY_CARE_PROVIDER_SITE_OTHER): Payer: Medicare Other | Admitting: Family Medicine

## 2018-09-22 ENCOUNTER — Other Ambulatory Visit: Payer: Self-pay

## 2018-09-22 DIAGNOSIS — M542 Cervicalgia: Secondary | ICD-10-CM

## 2018-09-22 DIAGNOSIS — R350 Frequency of micturition: Secondary | ICD-10-CM

## 2018-09-22 DIAGNOSIS — R1310 Dysphagia, unspecified: Secondary | ICD-10-CM

## 2018-09-22 DIAGNOSIS — C148 Malignant neoplasm of overlapping sites of lip, oral cavity and pharynx: Secondary | ICD-10-CM

## 2018-09-22 DIAGNOSIS — C062 Malignant neoplasm of retromolar area: Secondary | ICD-10-CM

## 2018-09-22 NOTE — Progress Notes (Signed)
Virtual Visit via Telephone Note  I connected with@ on 09/22/18 at 10:00 AM EDT by telephone and verified that I am speaking with the correct person using two identifiers.   I discussed the limitations, risks, security and privacy concerns of performing an evaluation and management service by telephone and the availability of in person appointments. I also discussed with the patient that there may be a patient responsible charge related to this service. The patient expressed understanding and agreed to proceed.  Location patient: home Location provider: work or home office Participants present for the call: patient, provider Patient did not have a visit in the prior 7 days to address this/these issue(s).   History of Present Illness: Last visit with me was 04/2018 for establish care. He has complicated medical history with SCC throat s/p radical neck dissection/radiation; complicated by FTT; recurrent syncopy due to chronic hypotension on midodrine. We discussed working on increasing protein intake and exercise on regular basis.     "I have Spinal stenosis with arthritis, 5 cancers, chairbound, home bound, and I feel I am at the lowest point in my life". Seeing Dr. Nelva Bush for back and given steroid burst which didn't help. They were going to call him to schedule shot, but hasn't heard from them. Latest thing is that he got up out of chair 3/20 to get ready to go to bed and slipped off walker and fell on right side. Hit head on floor. Felt like someone had hit him in side of head. Neighbor came over and picked him up to chair. Next day side of head very sensitive. 4 days later got pain across jaw/side of face and got sharp pain through neck/side of face. Has been getting this for 10 days. Can hardly turn head. Took 3 ASA last night and this morning feels like pain is a little better and he feels he can move head a few more degrees. No pain if sitting still. If he turns head then has 3-4 level of pain.  No pain looking down; there is pain looking up and turning head to side.    Has 2 appointments this mo with oncology to monitor CLL and jaw cancer.   Getting ensure and buying protein powder. Drinks 1 8 oz bottle ensure in morning and does serving protein powder. 40-50g protein/day. Not getting enough exercise, but told self he is going to work on this now.   Having to get up 2-4 times at night to urinate. Urgency worse in last 6 months. No blood in urine, no dysuria.   States that he has never been depressed. He feels that mood overall is ok and that he is just trying to deal with everything the best way that he can.   Observations/Objective: Initially Timothy Kim sounded upset when we started talking, but as he ellaborated on current symptoms and discussed improvement in recent neck pain, spirits seemed to lift. His overall tone was very appreciative and he feels grateful that he has been able to make it through all of the medical complications he has had.. I do not appreciate any SOB. Speech and thought processing are grossly intact. Patient reported vitals: none available.  Assessment and Plan: 1. Neck pain Has had improvement overnight with ASA. He will continue to take this daily and we will check in with him next week.  2. Urinary frequency Suspect largely due to increased orange juice intake in evening/afternoon. Advised decreasing evening fluids and we will check in next week. Consider UA/PSA pending  update.  3. 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) Following w oncolog.  4. Cancer of the lip, oral cavity, and pharynx ([place]) Following with oncology.  5. Dysphagia, unspecified type Doing better with increasing supplemental/protein intake during day.   We discussed importance of exercise/stretching/mobility and he is going to work on doing this at home more.    Follow Up Instructions: See above; we will call next week. Let us know sooner if any worsening.   I did  not refer this patient for an OV in the next 24 hours for this/these issue(s).  I discussed the assessment and treatment plan with the patient. The patient was provided an opportunity to ask questions and all were answered. The patient agreed with the plan and demonstrated an understanding of the instructions.   The patient was advised to call back or seek an in-person evaluation if the symptoms worsen or if the condition fails to improve as anticipated.  I provided 25 minutes of non-face-to-face time during this encounter.   Micheline Rough, MD

## 2018-09-23 ENCOUNTER — Telehealth: Payer: Self-pay

## 2018-09-23 NOTE — Telephone Encounter (Signed)
Called and given below message. He verbalized understanding and is doing well. Scheduling messages sent.

## 2018-09-23 NOTE — Telephone Encounter (Signed)
-----   Message from Heath Lark, MD sent at 09/23/2018  7:59 AM EDT ----- Regarding: reschedule to July

## 2018-09-28 ENCOUNTER — Telehealth: Payer: Self-pay | Admitting: Hematology and Oncology

## 2018-09-28 NOTE — Telephone Encounter (Signed)
Scheduled appt per 4/2 sch message - pt aware and reminder letter sent in the mail per patient request.

## 2018-10-06 ENCOUNTER — Telehealth: Payer: Self-pay | Admitting: *Deleted

## 2018-10-06 NOTE — Telephone Encounter (Signed)
Called patient to alter fu for 10-08-18 due to Dr. Isidore Moos not being here, moved appt. To 11-12-18 @ 2:20 pm, patient agreed to new time and date

## 2018-10-08 ENCOUNTER — Ambulatory Visit: Payer: Medicare Other | Admitting: Radiation Oncology

## 2018-10-18 ENCOUNTER — Ambulatory Visit: Payer: Self-pay | Admitting: Hematology and Oncology

## 2018-10-18 ENCOUNTER — Other Ambulatory Visit: Payer: Self-pay

## 2018-11-05 ENCOUNTER — Telehealth: Payer: Self-pay | Admitting: Family Medicine

## 2018-11-05 ENCOUNTER — Other Ambulatory Visit: Payer: Self-pay | Admitting: Family Medicine

## 2018-11-05 MED ORDER — MIDODRINE HCL 10 MG PO TABS: ORAL_TABLET | ORAL | 1 refills | Status: DC

## 2018-11-05 NOTE — Telephone Encounter (Signed)
Copied from [place] [id number]. Topic: Quick Communication - Rx Refill/Question >> Nov 05, 2018  1:23 PM Scherrie Gerlach wrote: Medication: midodrine (PROAMATINE) 10 MG tablet  Pt called the pharmacy several days ago and the refill never got here.  Dr Raliegh Ip was the last to prescribe.  So this will be new for Dr Ethlyn Gallery  Pt has only today left and needs asap  [id number] - [place] [place] [address] [phone] (Phone) [phone] (Fax)

## 2018-11-05 NOTE — Telephone Encounter (Signed)
Refilled. Please remind him if refill issues in future (no news in 72 hours) just to contact us. I feel we have had a lot of errors with getting pharmacy messages lately. Worse for transfers of care, but even those who are not transfers.

## 2018-11-05 NOTE — Telephone Encounter (Signed)
I called the pt and informed him of the message below. 

## 2018-11-12 ENCOUNTER — Ambulatory Visit
Admission: RE | Admit: 2018-11-12 | Discharge: 2018-11-12 | Disposition: A | Discharge status: Home / Self Care | Payer: Medicare Other | Source: Ambulatory Visit | Attending: Radiation Oncology | Admitting: Radiation Oncology

## 2018-11-12 ENCOUNTER — Encounter: Payer: Self-pay | Admitting: Radiation Oncology

## 2018-11-12 DIAGNOSIS — Z1329 Encounter for screening for other suspected endocrine disorder: Secondary | ICD-10-CM

## 2018-11-12 DIAGNOSIS — Z08 Encounter for follow-up examination after completed treatment for malignant neoplasm: Secondary | ICD-10-CM | POA: Diagnosis not present

## 2018-11-12 DIAGNOSIS — C062 Malignant neoplasm of retromolar area: Secondary | ICD-10-CM

## 2018-11-12 DIAGNOSIS — Z85818 Personal history of malignant neoplasm of other sites of lip, oral cavity, and pharynx: Secondary | ICD-10-CM | POA: Diagnosis not present

## 2018-11-12 NOTE — Progress Notes (Signed)
Radiation Oncology         [phone] ________________________________  Name: Timothy Kim MRN: [id number]  Date: 11/12/2018  DOB: [birth year]  Follow-Up Phone Note  CC: Timothy Macadam, MD  Timothy Doheny, MD  Diagnosis and Prior Radiotherapy:       ICD-10-CM   1. Screening for hypothyroidism Z13.29 TSH  2. 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  Degenerative spinal stenosis with arthritis are bothering him. "I'm an invalid."  No jaw pain.  Feels a little bit of flesh getting bigger in the right jaw.  "Teeth are maloccluded."  This is swelling in the cheek, no mucosal nodularity or ulceration.  No mucosal soreness.  Overall, from a H+N standpoint, he feels he is at his New Normal.                 ALLERGIES:  has No Known Allergies.  Meds: Current Outpatient Medications  Medication Sig Dispense Refill  . brimonidine-timolol (COMBIGAN) 0.2-0.5 % ophthalmic solution Place 1 drop into both eyes 2 (two) times daily.     . feeding supplement, ENSURE ENLIVE, (ENSURE ENLIVE) LIQD Take 237 mLs by mouth 2 (two) times daily between meals. 237 mL 12  . latanoprost (XALATAN) 0.005 % ophthalmic solution Place 1 drop into both eyes every evening.   4  .  midodrine (PROAMATINE) 10 MG tablet TAKE 1 TABLET 3 TIMES A DAY WITH MEALS 270 tablet 1  . Multiple Vitamin (MULTIVITAMIN WITH MINERALS) TABS tablet Take 1 tablet by mouth daily. 30 tablet 11  . RHOPRESSA 0.02 % SOLN Place 1 drop into the right eye daily.     No current facility-administered medications for this encounter.     Physical Findings: The patient is in no acute distress. Patient is alert and oriented. Wt Readings from Last 3 Encounters:  05/07/18 128 lb 6.4 oz (58.2 kg)  04/05/18 110 lb (49.9 kg)  03/21/18 110 lb 14.3 oz (50.3 kg)    vitals were not taken for this visit. .    Lab Findings: Lab Results  Component Value Date   WBC 8.4 03/22/2018   HGB 11.7 (L) 03/22/2018   HCT 33.9 (L) 03/22/2018   MCV 104.3 (H) 03/22/2018   PLT 180 03/22/2018    Lab Results  Component Value Date   TSH 3.92 08/12/2017    Radiographic Findings: No results found.  Impression/Plan:    1) Head and Neck Cancer Status: No specific complaints indicative of recurrence. Assessment limited by lack of physical exam  2) Nutritional Status: patient reports today he is pureeing food, drinking Ensure   3) Risk Factors: The patient has been educated about risk factors including alcohol and tobacco abuse; they understand that avoidance of alcohol  and tobacco is important to prevent recurrences as well as other cancers  4) Swallowing: okay with purees, liquids  5) Dental: Encouraged to continue regular followup with dentistry, and dental hygiene including fluoride rinses.    6) Thyroid function:  Will check at next visit Lab Results  Component Value Date   TSH 3.92 08/12/2017   7) Other: Timothy Orem, RN, our Head and Neck Oncology Navigator will arrange f/u with Timothy Kim in 3 mo - pt was lost to follow-up.  8) Follow-up in 7 months. The patient was encouraged to call with any issues or questions before then.  This encounter was provided by telephone; he desired not to come in today and  this was an alternative given due to pandemic risks.  He couldn't access WebEx.   The time spent during this encounter was 10 minutes. The attendants for this meeting include Timothy Kim  and Timothy Kim.  During the encounter, Timothy Kim was located at [facility] Radiation Oncology Department.  Timothy Kim was located at home.   _____________________________________   Timothy Gibson, MD

## 2018-11-16 ENCOUNTER — Telehealth: Payer: Self-pay | Admitting: *Deleted

## 2018-11-16 NOTE — Telephone Encounter (Signed)
Oncology Nurse Navigator Documentation  Per patient's 11/12/2018 post-treatment follow-up with Dr. Isidore Moos, called [facility] to coordinate appointment with Dr. Erik Obey.  Spoke with Evans Lance, requested patient be contacted and scheduled for routine follow-up in 3 months.  She verbalized understanding.  Gayleen Orem, RN, BSN Head & Neck Oncology Nurse [place] [facility] [place] [phone]

## 2018-11-19 ENCOUNTER — Telehealth: Payer: Self-pay | Admitting: *Deleted

## 2018-11-19 NOTE — Telephone Encounter (Signed)
CALLED PATIENT TO INFORM OF LAB AND FU ON 06-07-19 (2 PM FOR LAB AND 2:20 PM FOR FU), SPOKE WITH PATIENT AND HE AGREED TO THIS DATE AND THESE TIMES

## 2018-11-23 ENCOUNTER — Other Ambulatory Visit: Payer: Self-pay

## 2018-11-23 ENCOUNTER — Ambulatory Visit (INDEPENDENT_AMBULATORY_CARE_PROVIDER_SITE_OTHER): Payer: Medicare Other | Admitting: Podiatry

## 2018-11-23 ENCOUNTER — Encounter: Payer: Self-pay | Admitting: Podiatry

## 2018-11-23 VITALS — Temp 98.4°F

## 2018-11-23 DIAGNOSIS — B351 Tinea unguium: Secondary | ICD-10-CM | POA: Diagnosis not present

## 2018-11-23 DIAGNOSIS — M79675 Pain in left toe(s): Secondary | ICD-10-CM

## 2018-11-23 DIAGNOSIS — M79674 Pain in right toe(s): Secondary | ICD-10-CM | POA: Diagnosis not present

## 2018-11-23 NOTE — Patient Instructions (Signed)

## 2018-11-27 NOTE — Progress Notes (Signed)
Subjective:  Timothy Kim presents to clinic today with cc of  painful, thick, discolored, elongated toenails 1-5 b/l that become tender and cannot cut because of thickness.  Pain is aggravated when wearing enclosed shoe gear.  He voices no new pedal concerns on today's visit.   Current Outpatient Medications:  .  brimonidine-timolol (COMBIGAN) 0.2-0.5 % ophthalmic solution, Place 1 drop into both eyes 2 (two) times daily. , Disp: , Rfl:  .  feeding supplement, ENSURE ENLIVE, (ENSURE ENLIVE) LIQD, Take 237 mLs by mouth 2 (two) times daily between meals., Disp: 237 mL, Rfl: 12 .  latanoprost (XALATAN) 0.005 % ophthalmic solution, Place 1 drop into both eyes every evening. , Disp: , Rfl: 4 .  midodrine (PROAMATINE) 10 MG tablet, TAKE 1 TABLET 3 TIMES A DAY WITH MEALS, Disp: 270 tablet, Rfl: 1 .  Multiple Vitamin (MULTIVITAMIN WITH MINERALS) TABS tablet, Take 1 tablet by mouth daily., Disp: 30 tablet, Rfl: 11 .  RHOPRESSA 0.02 % SOLN, Place 1 drop into the right eye daily., Disp: , Rfl:    No Known Allergies   Objective: Vitals:   11/23/18 1328  Temp: 98.4 F (36.9 C)    Physical Examination:  Vascular Examination: Capillary refill time <3 seconds x 10 digits.  Palpable DP/PT pulses b/l.  Digital hair absent b/l.   No edema noted b/l.  Skin temperature gradient WNL b/l.  Dermatological Examination: Skin with age related atrophy b/l.  No open wounds b/l.  No interdigital macerations noted b/l.  Elongated, thick, discolored brittle toenails with subungual debris and pain on dorsal palpation of nailbeds 1-5 b/l.  Musculoskeletal Examination: Muscle strength 5/5 to all muscle groups b/l.  No pain, crepitus or joint discomfort with active/passive ROM.  Neurological Examination: Sensation diminished b/l with 10 gram monofilament.  Vibratory sensation diminished b/l.  Assessment: Mycotic nail infection with pain 1-5 b/l  Plan: 1. Toenails 1-5 b/l were debrided  in length and girth without iatrogenic laceration. 2.  Continue soft, supportive shoe gear daily. 3.  Report any pedal injuries to medical professional. 4.  Follow up 3 months. 5.  Patient/POA to call should there be a question/concern in there interim.

## 2018-12-02 DIAGNOSIS — M5136 Other intervertebral disc degeneration, lumbar region: Secondary | ICD-10-CM | POA: Diagnosis not present

## 2018-12-09 ENCOUNTER — Other Ambulatory Visit: Payer: Self-pay | Admitting: Dermatology

## 2018-12-09 DIAGNOSIS — C44529 Squamous cell carcinoma of skin of other part of trunk: Secondary | ICD-10-CM | POA: Diagnosis not present

## 2018-12-09 DIAGNOSIS — D0439 Carcinoma in situ of skin of other parts of face: Secondary | ICD-10-CM | POA: Diagnosis not present

## 2018-12-09 DIAGNOSIS — L720 Epidermal cyst: Secondary | ICD-10-CM | POA: Diagnosis not present

## 2018-12-13 DIAGNOSIS — H04123 Dry eye syndrome of bilateral lacrimal glands: Secondary | ICD-10-CM | POA: Diagnosis not present

## 2018-12-13 DIAGNOSIS — H401121 Primary open-angle glaucoma, left eye, mild stage: Secondary | ICD-10-CM | POA: Diagnosis not present

## 2018-12-13 DIAGNOSIS — Z961 Presence of intraocular lens: Secondary | ICD-10-CM | POA: Diagnosis not present

## 2018-12-13 DIAGNOSIS — H401113 Primary open-angle glaucoma, right eye, severe stage: Secondary | ICD-10-CM | POA: Diagnosis not present

## 2018-12-13 DIAGNOSIS — H353132 Nonexudative age-related macular degeneration, bilateral, intermediate dry stage: Secondary | ICD-10-CM | POA: Diagnosis not present

## 2019-01-18 ENCOUNTER — Other Ambulatory Visit: Payer: Self-pay

## 2019-01-18 ENCOUNTER — Ambulatory Visit: Payer: Self-pay | Admitting: Hematology and Oncology

## 2019-02-14 ENCOUNTER — Encounter: Payer: Self-pay | Admitting: Podiatry

## 2019-02-15 ENCOUNTER — Other Ambulatory Visit: Payer: Self-pay | Admitting: *Deleted

## 2019-02-15 MED ORDER — MIDODRINE HCL 10 MG PO TABS: ORAL_TABLET | ORAL | 0 refills | Status: DC

## 2019-03-01 ENCOUNTER — Ambulatory Visit: Payer: Medicare Other | Admitting: Podiatry

## 2019-03-08 DIAGNOSIS — H6123 Impacted cerumen, bilateral: Secondary | ICD-10-CM | POA: Diagnosis not present

## 2019-03-08 DIAGNOSIS — Z923 Personal history of irradiation: Secondary | ICD-10-CM | POA: Diagnosis not present

## 2019-03-08 DIAGNOSIS — M962 Postradiation kyphosis: Secondary | ICD-10-CM | POA: Diagnosis not present

## 2019-03-08 DIAGNOSIS — Z7289 Other problems related to lifestyle: Secondary | ICD-10-CM | POA: Diagnosis not present

## 2019-03-08 DIAGNOSIS — Z85818 Personal history of malignant neoplasm of other sites of lip, oral cavity, and pharynx: Secondary | ICD-10-CM | POA: Diagnosis not present

## 2019-03-14 DIAGNOSIS — H401121 Primary open-angle glaucoma, left eye, mild stage: Secondary | ICD-10-CM | POA: Diagnosis not present

## 2019-03-14 DIAGNOSIS — H04123 Dry eye syndrome of bilateral lacrimal glands: Secondary | ICD-10-CM | POA: Diagnosis not present

## 2019-03-14 DIAGNOSIS — H353132 Nonexudative age-related macular degeneration, bilateral, intermediate dry stage: Secondary | ICD-10-CM | POA: Diagnosis not present

## 2019-03-14 DIAGNOSIS — Z961 Presence of intraocular lens: Secondary | ICD-10-CM | POA: Diagnosis not present

## 2019-03-14 DIAGNOSIS — H401113 Primary open-angle glaucoma, right eye, severe stage: Secondary | ICD-10-CM | POA: Diagnosis not present

## 2019-03-22 ENCOUNTER — Other Ambulatory Visit: Payer: Self-pay | Admitting: Dermatology

## 2019-03-22 DIAGNOSIS — L570 Actinic keratosis: Secondary | ICD-10-CM | POA: Diagnosis not present

## 2019-03-22 DIAGNOSIS — D485 Neoplasm of uncertain behavior of skin: Secondary | ICD-10-CM | POA: Diagnosis not present

## 2019-04-06 ENCOUNTER — Encounter: Payer: Self-pay | Admitting: Podiatry

## 2019-04-06 ENCOUNTER — Ambulatory Visit (INDEPENDENT_AMBULATORY_CARE_PROVIDER_SITE_OTHER): Payer: Medicare Other | Admitting: Podiatry

## 2019-04-06 ENCOUNTER — Other Ambulatory Visit: Payer: Self-pay

## 2019-04-06 DIAGNOSIS — B351 Tinea unguium: Secondary | ICD-10-CM

## 2019-04-06 DIAGNOSIS — M79674 Pain in right toe(s): Secondary | ICD-10-CM

## 2019-04-06 DIAGNOSIS — M79675 Pain in left toe(s): Secondary | ICD-10-CM | POA: Diagnosis not present

## 2019-04-06 NOTE — Patient Instructions (Signed)

## 2019-04-11 NOTE — Progress Notes (Signed)
Subjective: Timothy Kim is seen today for follow up painful, elongated, thickened toenails 1-5 b/l feet that he cannot cut. Pain interferes with daily activities. Aggravating factor includes wearing enclosed shoe gear and relieved with periodic debridement.  Current Outpatient Medications on File Prior to Visit  Medication Sig  . brimonidine-timolol (COMBIGAN) 0.2-0.5 % ophthalmic solution Place 1 drop into both eyes 2 (two) times daily.   . dorzolamide (TRUSOPT) 2 % ophthalmic solution PLACE 1 DROP INTO BOTH EYES TWICE DAILY  . feeding supplement, ENSURE ENLIVE, (ENSURE ENLIVE) LIQD Take 237 mLs by mouth 2 (two) times daily between meals.  . latanoprost (XALATAN) 0.005 % ophthalmic solution Place 1 drop into both eyes every evening.   . midodrine (PROAMATINE) 10 MG tablet TAKE 1 TABLET 3 TIMES A DAY WITH MEALS  . Multiple Vitamin (MULTIVITAMIN WITH MINERALS) TABS tablet Take 1 tablet by mouth daily.  . predniSONE (DELTASONE) 10 MG tablet prednisone 10 mg tablet  . RHOPRESSA 0.02 % SOLN Place 1 drop into the right eye daily.  . traMADol (ULTRAM) 50 MG tablet tramadol 50 mg tablet   No current facility-administered medications on file prior to visit.      No Known Allergies   Objective:  Vascular Examination: Capillary refill time <3 seconds x 10 digits.  Dorsalis pedis present b/l.  Posterior tibial pulses present b/l.  Digital hair absent b/l.   Skin temperature gradient WNL b/l.   Dermatological Examination: Skin with age related atrophy b/l.  Toenails 1-5 b/l discolored, thick, dystrophic with subungual debris and pain with palpation to nailbeds due to thickness of nails.  Musculoskeletal: Muscle strength 5/5 to all LE muscle groups b/l.   No gross bony deformities b/l.  No pain, crepitus or joint limitation noted with ROM.   Neurological Examination: Protective sensation diminished b/lwith 10 gram monofilament bilaterally.  Assessment: Painful onychomycosis  toenails 1-5 b/l   Plan: 1. Toenails 1-5 b/l were debrided in length and girth without iatrogenic bleeding. 2. Patient to continue soft, supportive shoe gear 3. Patient to report any pedal injuries to medical professional immediately. 4. Follow up 3 months.  5. Patient/POA to call should there be a concern in the interim.

## 2019-04-20 ENCOUNTER — Ambulatory Visit (INDEPENDENT_AMBULATORY_CARE_PROVIDER_SITE_OTHER): Payer: Medicare Other | Admitting: *Deleted

## 2019-04-20 ENCOUNTER — Encounter: Payer: Self-pay | Admitting: Family Medicine

## 2019-04-20 ENCOUNTER — Other Ambulatory Visit: Payer: Self-pay

## 2019-04-20 DIAGNOSIS — Z23 Encounter for immunization: Secondary | ICD-10-CM

## 2019-05-17 ENCOUNTER — Other Ambulatory Visit: Payer: Self-pay

## 2019-06-06 ENCOUNTER — Telehealth: Payer: Self-pay | Admitting: *Deleted

## 2019-06-06 NOTE — Telephone Encounter (Signed)
Called patient to remind of  2 pm lab for 06-07-19, spoke with patient and he is aware of this lab

## 2019-06-07 ENCOUNTER — Ambulatory Visit: Payer: Medicare Other | Attending: Radiation Oncology

## 2019-06-07 ENCOUNTER — Ambulatory Visit
Admission: RE | Admit: 2019-06-07 | Discharge: 2019-06-07 | Disposition: A | Discharge status: Home / Self Care | Payer: Medicare Other | Source: Ambulatory Visit | Attending: Radiation Oncology | Admitting: Radiation Oncology

## 2019-06-07 ENCOUNTER — Encounter: Payer: Self-pay | Admitting: Radiation Oncology

## 2019-06-07 DIAGNOSIS — R531 Weakness: Secondary | ICD-10-CM

## 2019-06-07 DIAGNOSIS — Z1329 Encounter for screening for other suspected endocrine disorder: Secondary | ICD-10-CM

## 2019-06-07 NOTE — Progress Notes (Signed)
Radiation Oncology         [phone] ________________________________  Name: Timothy Kim MRN: [id number]  Date: 06/07/2019  DOB: [birth year]  Follow-Up Phone Note by telephone as patient was unable to access MyChart video during pandemic precautions   CC: Caren Macadam, MD  Philomena Doheny, MD  Diagnosis and Prior Radiotherapy:       ICD-10-CM   1. Weakness generalized  R53.1 TSH  2. 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) Staging form: Lip and Oral Cavity, AJCC 7th Edition - Clinical: Stage IVA (T4a, N0, M0) - Unsigned - Pathologic: Stage IVB (T4b, N0, cM0) - Signed by Eppie Gibson, MD on 08/23/2014   STAGE IVB pT4b pN0 Grade 2 Squamous cell carcinoma, Right Retromolar Trigone; +PNI, no LVSI  Indication for treatment: post-op, curative    Radiation treatment dates:   09/04/2014-10/13/2014 Site/dose:  Right retromolar region (tumor bed) and right neck /  60 Gy in 30 fractions to tumor bed and 54 Gy in 30 fractions to neck   CHIEF COMPLAINT:  Here for follow-up and surveillance of oral cancer  Narrative:  The patient saw Dr. Erik Obey 3 months ago with no evidence of disease.  He plans to continue to see him every 6 months for cerumen impaction management.  Degenerative spinal stenosis with arthritis are still bothering him.    "Teeth are still maloccluded."     He purees his food and drinks shakes with protein powder.  He states over the past 2 weeks he has felt a "marble lump at his chin". No new masses.  He denies fatigue, cold intolerance, and weight changes.           ALLERGIES:  has No Known Allergies.  Meds: Current Outpatient Medications  Medication Sig Dispense Refill  . brimonidine-timolol (COMBIGAN) 0.2-0.5 % ophthalmic solution Place 1 drop into both eyes 2 (two) times daily.     . dorzolamide (TRUSOPT) 2 % ophthalmic solution PLACE 1 DROP INTO BOTH EYES TWICE DAILY    . feeding supplement, ENSURE ENLIVE, (ENSURE ENLIVE) LIQD Take 237 mLs by mouth 2 (two) times daily between meals. 237 mL 12  . latanoprost (XALATAN) 0.005 % ophthalmic solution Place 1 drop into both eyes every evening.   4  . midodrine (PROAMATINE) 10 MG tablet TAKE 1 TABLET 3 TIMES A DAY WITH MEALS 270 tablet 0  . Multiple Vitamin (MULTIVITAMIN WITH MINERALS) TABS tablet Take 1 tablet by mouth daily. 30 tablet 11  . predniSONE (DELTASONE) 10 MG tablet prednisone 10 mg tablet    . RHOPRESSA 0.02 % SOLN Place 1 drop into the right eye daily.    . traMADol (ULTRAM) 50 MG tablet tramadol 50 mg tablet     No current facility-administered medications for this encounter.    Physical Findings: The patient is in no acute distress. Patient is alert and oriented. Wt Readings from Last 3 Encounters:  05/07/18 128 lb 6.4 oz (58.2 kg)  04/05/18 110 lb (49.9 kg)  03/21/18 110 lb 14.3 oz (50.3 kg)    vitals were not taken for this  visit. .    Lab Findings: Lab Results  Component Value Date   WBC 8.4 03/22/2018   HGB 11.7 (L) 03/22/2018   HCT 33.9 (L) 03/22/2018   MCV 104.3 (H) 03/22/2018   PLT 180 03/22/2018    Lab Results  Component Value Date   TSH 3.92 08/12/2017    Radiographic Findings: No results found.  Impression/Plan:    1) Head and Neck Cancer Status: Difficult to examine the patient by phone.  His exam was reassuring 3 months ago in otolaryngology.  He mentions a new marble sized mass at his chin.  It is unlikely that this is a nodal recurrence 5 years out from diagnosis but still warrants an exam.  The patient would like to get through the holidays and  stay close to home as much as possible during the pandemic but is willing to see me back early next year.  He is agreeable to an appointment in February.  At that time we can check his thyroid function as he is due for a TSH as well.  He will let me know if the mass grows in the interim.  2) Nutritional Status: patient reports today he is pureeing food, drinking protein shakes, and denies weight changes  3) Risk Factors: The patient has been educated about risk factors including alcohol and tobacco abuse; they understand that avoidance of alcohol and tobacco is important to prevent recurrences as well as other cancers  4) Swallowing: okay with purees, liquids.  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    6) Thyroid function: See plan above Lab Results  Component Value Date   TSH 3.92 08/12/2017      This encounter was provided by telephone; he desired not to come in today and this was an alternative given due to pandemic risks.  He couldn't access WebEx.   The time spent during this encounter was 10 minutes. The attendants for this meeting include Eppie Gibson  and DELSHAWN COKLEY.  During the encounter, Eppie Gibson was located at [facility] Radiation Oncology Department.  Timothy Kim was located at home.   _____________________________________   Eppie Gibson, MD

## 2019-06-10 ENCOUNTER — Other Ambulatory Visit: Payer: Self-pay | Admitting: Family Medicine

## 2019-06-27 ENCOUNTER — Other Ambulatory Visit: Payer: Self-pay | Admitting: Dermatology

## 2019-07-14 ENCOUNTER — Ambulatory Visit: Payer: Medicare Other

## 2019-07-15 ENCOUNTER — Other Ambulatory Visit: Payer: Self-pay

## 2019-07-15 ENCOUNTER — Ambulatory Visit (INDEPENDENT_AMBULATORY_CARE_PROVIDER_SITE_OTHER): Payer: Medicare Other | Admitting: Podiatry

## 2019-07-15 ENCOUNTER — Encounter: Payer: Self-pay | Admitting: Podiatry

## 2019-07-15 DIAGNOSIS — M79674 Pain in right toe(s): Secondary | ICD-10-CM | POA: Diagnosis not present

## 2019-07-15 DIAGNOSIS — B351 Tinea unguium: Secondary | ICD-10-CM

## 2019-07-15 DIAGNOSIS — M79675 Pain in left toe(s): Secondary | ICD-10-CM | POA: Diagnosis not present

## 2019-07-15 NOTE — Patient Instructions (Signed)

## 2019-07-21 NOTE — Progress Notes (Signed)
Subjective: Timothy Kim presents today for follow up of painful mycotic nails b/l that are difficult to trim. Pain interferes with ambulation. Aggravating factors include wearing enclosed shoe gear. Pain is relieved with periodic professional debridement.   No Known Allergies   Objective: There were no vitals filed for this visit.  Vascular Examination:  Capillary fill time to digits <3s b/l, palpable DP pulses b/l, palpable PT pulses b/l, pedal hair absent b/l and skin temperature gradient within normal limits b/l  Dermatological Examination: Pedal skin is thin shiny, atrophic bilaterally, no open wounds bilaterally, no interdigital macerations bilaterally and toenails 1-5 b/l elongated, dystrophic, thickened, crumbly with subungual debris  Musculoskeletal: Normal muscle strength 5/5 to all lower extremity muscle groups bilaterally, no gross bony deformities bilaterally and no pain crepitus or joint limitation noted with ROM b/l  Neurological: Protective sensation absent with 10g monofilament b/l and vibratory sensation absent b/l  Assessment: 1. Pain due to onychomycosis of toenails of both feet     Plan: -Toenails 1-5 b/l were debrided in length and girth without iatrogenic bleeding. -Patient to continue soft, supportive shoe gear daily. -Patient to report any pedal injuries to medical professional immediately. -Patient/POA to call should there be question/concern in the interim.  Return in about 3 months (around 10/13/2019) for nail trim.

## 2019-07-25 ENCOUNTER — Telehealth: Payer: Self-pay | Admitting: *Deleted

## 2019-07-25 NOTE — Telephone Encounter (Signed)
Called patient to remind of lab and fu appt. for 07-26-19- 2 pm - lab and 2:20 pm - fu with Dr. Isidore Moos, spoke with patient and he is aware of these appts.

## 2019-07-25 NOTE — Progress Notes (Signed)
Mr. Selders presents for follow up of radiation completed 10/13/2014 to his Right retromolar region and right neck.    Pain issues, if any: He reports pain to his right ear area and right neck area Using a feeding tube?: No Weight changes, if any:  Wt Readings from Last 3 Encounters:  05/07/18 128 lb 6.4 oz (58.2 kg)  04/05/18 110 lb (49.9 kg)  03/21/18 110 lb 14.3 oz (50.3 kg)  He tells me that he weighed 134 lbs yesterday at home after a bath.  Swallowing issues, if any: He is eating a pureed diet. He needs to be careful with food with more texture, he says he is unable to "get it down his throat". He is drinking a homemade drink prepared by his caregiver several times daily. They put protein powder in this drink.  Smoking or chewing tobacco? No Using fluoride trays daily? He is using fluoride mouth wash and fluoride toothpaste.  Last ENT visit was on: [id number] Dr. Erik Obey Impression & Plans:  Bilateral cerumen impactions. 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 Plan: I would clean him every 6 months. Lilyan Gilford, MD  Other notable issues, if any:  He report decreased hearing to his left ear today.  He is concerned about swelling to his right jaw and tells me it feels like his jaw is shifting.    BP (!) 162/98 (BP Location: Right Arm, Patient Position: Sitting, Cuff Size: Normal)   Pulse 66   Temp 97.9 F (36.6 C)   Resp 18   SpO2 98%

## 2019-07-26 ENCOUNTER — Other Ambulatory Visit: Payer: Self-pay | Admitting: Radiation Oncology

## 2019-07-26 ENCOUNTER — Ambulatory Visit
Admission: RE | Admit: 2019-07-26 | Discharge: 2019-07-26 | Disposition: A | Discharge status: Home / Self Care | Payer: Medicare Other | Source: Ambulatory Visit | Attending: Radiation Oncology | Admitting: Radiation Oncology

## 2019-07-26 ENCOUNTER — Other Ambulatory Visit: Payer: Self-pay

## 2019-07-26 ENCOUNTER — Encounter: Payer: Self-pay | Admitting: Radiation Oncology

## 2019-07-26 VITALS — BP 162/98 | HR 66 | Temp 97.9°F | Resp 18 | Wt 134.0 lb

## 2019-07-26 DIAGNOSIS — Z85819 Personal history of malignant neoplasm of unspecified site of lip, oral cavity, and pharynx: Secondary | ICD-10-CM | POA: Diagnosis not present

## 2019-07-26 DIAGNOSIS — M542 Cervicalgia: Secondary | ICD-10-CM | POA: Diagnosis not present

## 2019-07-26 DIAGNOSIS — Z1329 Encounter for screening for other suspected endocrine disorder: Secondary | ICD-10-CM | POA: Insufficient documentation

## 2019-07-26 DIAGNOSIS — Z79899 Other long term (current) drug therapy: Secondary | ICD-10-CM | POA: Diagnosis not present

## 2019-07-26 DIAGNOSIS — C148 Malignant neoplasm of overlapping sites of lip, oral cavity and pharynx: Secondary | ICD-10-CM

## 2019-07-26 DIAGNOSIS — Z9221 Personal history of antineoplastic chemotherapy: Secondary | ICD-10-CM | POA: Insufficient documentation

## 2019-07-26 DIAGNOSIS — R531 Weakness: Secondary | ICD-10-CM | POA: Insufficient documentation

## 2019-07-26 DIAGNOSIS — Z08 Encounter for follow-up examination after completed treatment for malignant neoplasm: Secondary | ICD-10-CM | POA: Diagnosis not present

## 2019-07-26 DIAGNOSIS — Z923 Personal history of irradiation: Secondary | ICD-10-CM | POA: Insufficient documentation

## 2019-07-26 DIAGNOSIS — Z85818 Personal history of malignant neoplasm of other sites of lip, oral cavity, and pharynx: Secondary | ICD-10-CM | POA: Diagnosis not present

## 2019-07-26 DIAGNOSIS — M1990 Unspecified osteoarthritis, unspecified site: Secondary | ICD-10-CM | POA: Insufficient documentation

## 2019-07-26 DIAGNOSIS — M4800 Spinal stenosis, site unspecified: Secondary | ICD-10-CM | POA: Diagnosis not present

## 2019-07-26 DIAGNOSIS — C062 Malignant neoplasm of retromolar area: Secondary | ICD-10-CM

## 2019-07-26 LAB — TSH: TSH: 2.923 u[IU]/mL (ref 0.320–4.118)

## 2019-07-26 MED ORDER — GABAPENTIN 100 MG PO CAPS: 100.0000 mg | ORAL_CAPSULE | Freq: Three times a day (TID) | ORAL | 0 refills | Status: DC

## 2019-07-26 NOTE — Progress Notes (Addendum)
Radiation Oncology         [phone] ________________________________  Name: Timothy Kim MRN: [id number]  Date: 07/26/2019  DOB: August 09, 1929  Follow-Up Note Outpatient  CC: Caren Macadam, MD  Philomena Doheny, MD  Diagnosis and Prior Radiotherapy:       ICD-10-CM   1. Spinal stenosis, unspecified spinal region  M48.00 gabapentin (NEURONTIN) 100 MG capsule    Ambulatory referral to Pain Clinic  2. Cancer of the lip, oral cavity, and pharynx (HCC)  C14.8 gabapentin (NEURONTIN) 100 MG capsule    Amb Referral to Survivorship Long term    Ambulatory referral to Pain Clinic  3. 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) Staging form: Lip and Oral Cavity, AJCC 7th Edition - Clinical: Stage IVA (T4a, N0, M0) - Unsigned - Pathologic: Stage IVB (T4b, N0, cM0) - Signed by Eppie Gibson, MD on 08/23/2014   STAGE IVB pT4b pN0 Grade 2 Squamous cell carcinoma, Right Retromolar Trigone; +PNI, no LVSI  Indication for treatment: post-op, curative    Radiation treatment dates:   09/04/2014-10/13/2014 Site/dose:  Right retromolar region (tumor bed) and right neck /  60 Gy in 30 fractions to tumor bed and 54 Gy in 30 fractions to neck   CHIEF COMPLAINT:  Here for follow-up and surveillance of oral cancer  Narrative:  The patient saw Dr. Erik Obey in [id number] with no evidence of disease.  He plans to continue to see him every 6 months for cerumen impaction management.  He still is very hard of hearing despite this and reports chronic hearing loss since his surgery.  Degenerative  spinal stenosis with arthritis are still bothering him tremendously.  He reports that he is in his chair almost all of the day.  He reports that he is too debilitated to perform significant physical therapy.  He has also been having right neck pain for the past 6 weeks.  He is concerned about swelling to his right jaw and tells me it feels like his jaw is shifting.  "Teeth are still maloccluded and angled at 90 degrees."     He purees his food and drinks shakes with protein powder.  He states over the past 2 mo he had felt a "marble lump at his chin" but this is improved.  Wt Readings from Last 3 Encounters:  05/07/18 128 lb 6.4 oz (58.2 kg)  04/05/18 110 lb (49.9 kg)  03/21/18 110 lb 14.3 oz (50.3 kg)  He tells me that he weighed 134 lbs yesterday at home after a bath.  He declined having his weight checked today as he has trouble getting out of his wheelchair.  Swallowing issues, if any: He is eating a pureed diet. He needs to be careful with food with more texture, he says he is unable to "get it down his throat". He is drinking a homemade drink prepared by his significant other several times times daily. They put protein powder in this drink.   Smoking or chewing tobacco? No Using fluoride trays daily? He is using fluoride mouth wash and fluoride  toothpaste.    ALLERGIES:  has No Known Allergies.  Meds: Current Outpatient Medications  Medication Sig Dispense Refill  . brimonidine-timolol (COMBIGAN) 0.2-0.5 % ophthalmic solution Place 1 drop into both eyes 2 (two) times daily.     . dorzolamide (TRUSOPT) 2 % ophthalmic solution PLACE 1 DROP INTO BOTH EYES TWICE DAILY    . latanoprost (XALATAN) 0.005 % ophthalmic solution Place 1 drop into both eyes every evening.   4  . midodrine (PROAMATINE) 10 MG tablet TAKE 1 TABLET BY MOUTH 3 TIMES A DAY WITH MEALS 270 tablet 0  . Multiple Vitamin (MULTIVITAMIN WITH MINERALS) TABS tablet Take 1 tablet by mouth daily. 30 tablet 11  . RHOPRESSA  0.02 % SOLN Place 1 drop into the right eye daily.    . traMADol (ULTRAM) 50 MG tablet tramadol 50 mg tablet    . feeding supplement, ENSURE ENLIVE, (ENSURE ENLIVE) LIQD Take 237 mLs by mouth 2 (two) times daily between meals. (Patient not taking: Reported on 07/26/2019) 237 mL 12  . gabapentin (NEURONTIN) 100 MG capsule Take 1 capsule (100 mg total) by mouth 3 (three) times daily. 90 capsule 0  . predniSONE (DELTASONE) 10 MG tablet prednisone 10 mg tablet     No current facility-administered medications for this encounter.    Physical Findings: General : the patient appears to be in pain. Patient is alert and oriented, chronically ill-appearing. Vitals:   07/26/19 1433  BP: (!) 162/98  Pulse: 66  Resp: 18  Temp: 97.9 F (36.6 C)  SpO2: 98%   He is sitting in a wheelchair Neck : no palpable adenopathy appreciated in the cervical or supraclavicular/submental neck HEENT: Mild lymphedema in right face; No tumor appreciated in the oral cavity or oropharynx.  No thrush; dentition are black, in poor repair Skin: Intact over neck  ECOG: 3   Lab Findings: Lab Results  Component Value Date   WBC 8.4 03/22/2018   HGB 11.7 (L) 03/22/2018   HCT 33.9 (L) 03/22/2018   MCV 104.3 (H) 03/22/2018   PLT 180 03/22/2018    Lab Results  Component Value Date   TSH 2.923 07/26/2019    Radiographic Findings: No results found.  Impression/Plan:    1) Head and Neck Cancer Status: NED; cured of head and neck cancer but with chronic comorbidities and quality of life issues.  2) Nutritional Status: patient reports today he is pureeing food, drinking protein shakes; reports weight is increasing -continue current nutritional strategies.  3) Risk Factors: The patient has been educated about risk factors including alcohol and tobacco abuse; they understand that avoidance of alcohol and tobacco is important to prevent recurrences as well as other cancers  4) Swallowing: okay with purees, liquids.   He states he has been told by another physician that he has a narrowed esophagus  5) Dental: Encouraged to continue regular followup with dentistry as feasible, and dental hygiene including fluoride rinses.   Unfortunately, he has multiple teeth that appeared necrotic today and extractions would carry considerable risks given his radiotherapy history.  6) Thyroid function: Results WNL today.  Given his prior history of radiotherapy to the neck I recommend continuing yearly TSH labs with his primary doctor; patient and his significant other were advised of this and I will send his PCP a note as well Lab Results  Component Value Date   TSH 2.923 07/26/2019     7) chronic pain in back from degenerative disease and spinal stenosis; he also reports right neck  pain today: I will start him at low-dose gabapentin and refer him to Dr. Clydell Hakim of [facility] for pain management.  The patient is not interested in opioids.  If the gabapentin is well-tolerated perhaps this dose will be escalated at the discretion of Dr. Clydell Hakim versus trying other medications and treatments.  Also, for the patient's quality of life I am referring him to our survivorship clinic to see  Sandi Mealy.  I recommend he continue to follow with otolaryngology.  I recommend that he discuss hearing aids with his otolaryngologist.  I will see him back on an as needed basis and will remain in touch with Sandi Mealy if the patient needs to be worked back in my clinic for any reason.  Note emailed to his team today:  Hi all, I saw Timothy Kim DOB 08/20/1929 today. He is 5 years out from treatment for oral cavity cancer and NED on exam.  Lucianne Lei - I am referring him to you for survivorship.  He has multiple comorbidities, chronic pain (see Clydell Hakim below) and he and his wife seem overwhelmed.  Seeing him in 2-3 mo will be perfect and please let me know if you have questions.  I will graduate him from my clinic since he is cured, but  happy to help if there are things that you feel are out of your expertise.  Ileene Hutchinson and Quita Skye -  I know Ileene Hutchinson has picked up following him since 2016, nevertheless I wanted to bring up to you both (since Quita Skye is familiar with his surgery) that the patient has neck pain and constant issues with jaw malalignment.  Also, hearing is poor despite cerumen removal; I recommended he discuss hearing aids w/ Ileene Hutchinson but he may forget. Not sure how much longer Ileene Hutchinson plans to keep his clinic open, and if transitioning back to [place] is your plan?  Eddie Dibbles - please see him for chronic back pain r/t spinal stenosis and neck pain (right) likely r/t anatomical changes from surgery +/- RT.  I started low dose gabapentin today and defer to you on long term pain management plans.  Junell - Would you be able to add TSHs to his annual labs given his h/o neck radiotherapy? He is slightly highly risk for hypothyroidism.  TSH normal today.  Thank you! Judson Roch   This encounter was in person with the patient and his significant other.  The time spent during this encounter on date of service, in total, was 60 min.     _____________________________________   Eppie Gibson, MD  This document serves as a record of services personally performed by Eppie Gibson, MD. It was created on her behalf by Wilburn Mylar, a trained medical scribe. The creation of this record is based on the scribe's personal observations and the provider's statements to them. This document has been checked and approved by the attending provider.

## 2019-07-28 ENCOUNTER — Telehealth: Payer: Self-pay | Admitting: *Deleted

## 2019-07-28 DIAGNOSIS — C44529 Squamous cell carcinoma of skin of other part of trunk: Secondary | ICD-10-CM | POA: Diagnosis not present

## 2019-07-28 DIAGNOSIS — D0471 Carcinoma in situ of skin of right lower limb, including hip: Secondary | ICD-10-CM | POA: Diagnosis not present

## 2019-07-28 NOTE — Telephone Encounter (Signed)
-----   Message from Caren Macadam, MD sent at 07/27/2019 11:48 PM EST ----- I haven't seen patient since 09/2018; please schedule CCV for him when able. Dr. Isidore Moos had sent a request to check some bloodwork in future. If he is feeling well; no rush. ----- Message ----- From: Eppie Gibson, MD Sent: 07/26/2019   4:26 PM EST To: Caren Macadam, MD, Lyndon Code Tanner, PA-C

## 2019-07-28 NOTE — Telephone Encounter (Signed)
Left a message for the pt to return my call.  

## 2019-07-28 NOTE — Telephone Encounter (Signed)
Patient called back and was informed of the message below.  Appt scheduled for 2/24 to arrive at 1:15pm per pts preference for an afternoon appt.

## 2019-08-01 ENCOUNTER — Telehealth: Payer: Self-pay | Admitting: Adult Health

## 2019-08-01 NOTE — Telephone Encounter (Signed)
Scheduled per 2/3 sch msg. Called and spoke with pt,confirmed 3/24 appt. Mailing printout

## 2019-08-02 ENCOUNTER — Telehealth: Payer: Self-pay | Admitting: *Deleted

## 2019-08-02 NOTE — Telephone Encounter (Signed)
CALLED PATIENT TO INFORM OF APPT. WITH DR. HARKINS ON 08/18/19 - ARRIVAL TIME- 1:45 PM @ DR. HARKINS, SPOKE WITH PATIENT AND HE IS AWARE OF THIS APPT.

## 2019-08-03 ENCOUNTER — Telehealth: Payer: Self-pay | Admitting: *Deleted

## 2019-08-03 NOTE — Telephone Encounter (Signed)
Called [facility] to arrange to 2 to 3 month fu with him for survivorship, lvm for a return call

## 2019-08-14 ENCOUNTER — Ambulatory Visit: Payer: Medicare Other

## 2019-08-16 ENCOUNTER — Other Ambulatory Visit: Payer: Self-pay

## 2019-08-17 ENCOUNTER — Encounter: Payer: Self-pay | Admitting: Family Medicine

## 2019-08-17 ENCOUNTER — Telehealth (INDEPENDENT_AMBULATORY_CARE_PROVIDER_SITE_OTHER): Payer: Medicare Other | Admitting: Family Medicine

## 2019-08-17 VITALS — BP 118/80 | HR 71 | Temp 97.9°F | Ht 67.0 in

## 2019-08-17 DIAGNOSIS — I712 Thoracic aortic aneurysm, without rupture: Secondary | ICD-10-CM | POA: Diagnosis not present

## 2019-08-17 DIAGNOSIS — E43 Unspecified severe protein-calorie malnutrition: Secondary | ICD-10-CM | POA: Diagnosis not present

## 2019-08-17 DIAGNOSIS — M8949 Other hypertrophic osteoarthropathy, multiple sites: Secondary | ICD-10-CM | POA: Diagnosis not present

## 2019-08-17 DIAGNOSIS — R5383 Other fatigue: Secondary | ICD-10-CM | POA: Diagnosis not present

## 2019-08-17 DIAGNOSIS — Z8551 Personal history of malignant neoplasm of bladder: Secondary | ICD-10-CM | POA: Diagnosis not present

## 2019-08-17 DIAGNOSIS — C062 Malignant neoplasm of retromolar area: Secondary | ICD-10-CM | POA: Diagnosis not present

## 2019-08-17 DIAGNOSIS — E039 Hypothyroidism, unspecified: Secondary | ICD-10-CM

## 2019-08-17 DIAGNOSIS — I7121 Aneurysm of the ascending aorta, without rupture: Secondary | ICD-10-CM

## 2019-08-17 DIAGNOSIS — C9110 Chronic lymphocytic leukemia of B-cell type not having achieved remission: Secondary | ICD-10-CM | POA: Diagnosis not present

## 2019-08-17 DIAGNOSIS — M159 Polyosteoarthritis, unspecified: Secondary | ICD-10-CM

## 2019-08-17 DIAGNOSIS — H60391 Other infective otitis externa, right ear: Secondary | ICD-10-CM | POA: Diagnosis not present

## 2019-08-17 NOTE — Patient Instructions (Signed)
Look for gabapentin at home. This was written by Dr. Isidore Moos to help with nerve pain. Would recommend taking in evening to start as it can cause drowsiness.

## 2019-08-17 NOTE — Progress Notes (Signed)
Timothy Kim DOB: [birth year] Encounter date: 08/17/2019  This is a 86 y.o. male who presents with Chief Complaint  Patient presents with  . Follow-up    History of present illness: Following regularly with Dr. Isidore Moos and radiation oncology for history of bladder cancer, squamous cell carcinoma of the retromolar trigone, and cancer of the lip oral cavity, pharynx.  He has been stable and is 5 years out from treatment for the oral cavity cancer.  Has difficulty swallowing. Has to have everything pureed. Thinks that it is still getting harder to swallow. He feels like cancer is coming back and affecting whole right side of head to jaw, and to neck. Drinks protein shake daily; feels right cheek is swelling. States that he showed Dr. Isidore Moos about 10 days ago when at visit.   Pain started about 2 months ago. Surgery was Jan 2016.   Sits all day; can't stand more than 8-10 minutes.    No Known Allergies Current Meds  Medication Sig  . brimonidine-timolol (COMBIGAN) 0.2-0.5 % ophthalmic solution Place 1 drop into both eyes 2 (two) times daily.   . dorzolamide (TRUSOPT) 2 % ophthalmic solution PLACE 1 DROP INTO BOTH EYES TWICE DAILY  . feeding supplement, ENSURE ENLIVE, (ENSURE ENLIVE) LIQD Take 237 mLs by mouth 2 (two) times daily between meals.  . gabapentin (NEURONTIN) 100 MG capsule Take 1 capsule (100 mg total) by mouth 3 (three) times daily.  Marland Kitchen latanoprost (XALATAN) 0.005 % ophthalmic solution Place 1 drop into both eyes every evening.   . midodrine (PROAMATINE) 10 MG tablet TAKE 1 TABLET BY MOUTH 3 TIMES A DAY WITH MEALS  . Multiple Vitamin (MULTIVITAMIN WITH MINERALS) TABS tablet Take 1 tablet by mouth daily.  . predniSONE (DELTASONE) 10 MG tablet prednisone 10 mg tablet  . RHOPRESSA 0.02 % SOLN Place 1 drop into the right eye daily.  . traMADol (ULTRAM) 50 MG tablet tramadol 50 mg tablet    Review of Systems  Constitutional: Negative for chills, fatigue and fever.  HENT:  Positive for dental problem, ear pain and hearing loss. Negative for facial swelling. Trouble swallowing: stable.   Respiratory: Negative for cough, chest tightness, shortness of breath and wheezing.   Cardiovascular: Negative for chest pain, palpitations and leg swelling.  Neurological: Positive for weakness.    Objective:  BP 118/80 (BP Location: Left Arm, Patient Position: Sitting, Cuff Size: Normal)   Pulse 71   Temp 97.9 F (36.6 C) (Temporal)   Ht 5\' 7"  (1.702 m)   SpO2 97%   BMI 20.99 kg/m       BP Readings from Last 3 Encounters:  08/17/19 118/80  07/26/19 (!) 162/98  05/24/18 (!) 156/91   Wt Readings from Last 3 Encounters:  07/26/19 134 lb (60.8 kg)  05/07/18 128 lb 6.4 oz (58.2 kg)  04/05/18 110 lb (49.9 kg)    Physical Exam Constitutional:      General: He is not in acute distress.    Appearance: He is underweight.  HENT:     Head:     Jaw: Tenderness and malocclusion present.     Left Ear: Tympanic membrane, ear canal and external ear normal.     Ears:     Comments: Unable to hear from right ear.  This is been chronic since his surgery.  TMs not visualized as there is significant amount of debris in his ear canal, white, light brown, purulent.  There is an odor to ear drainage.    Mouth/Throat:  Mouth: Mucous membranes are moist.     Comments: There is tenderness with palpation of the right lateral inner cheek.  Anatomy is difficult to appreciate due to reconstructive surgery.  There is some questionable scarring or thickening of this area. Neck:     Comments: No lymph nodes appreciated in the neck, but there is nodule right submandibular region, slightly mobile.  Patient states this is been there for couple years and has not changed in size.  It is somewhat tender to the touch. Cardiovascular:     Rate and Rhythm: Normal rate and regular rhythm.     Heart sounds: Normal heart sounds. No murmur. No friction rub.  Pulmonary:     Effort: Pulmonary effort  is normal. No respiratory distress.     Breath sounds: Normal breath sounds. No wheezing or rales.  Musculoskeletal:     Right lower leg: No edema.     Left lower leg: No edema.  Neurological:     Mental Status: He is alert and oriented to person, place, and time.  Psychiatric:        Behavior: Behavior normal.    Depression screen [facility] 2/9 10/02/2017 10/03/2016 04/04/2016 07/27/2015 03/16/2015  Decreased Interest 0 0 0 0 -  Down, Depressed, Hopeless 0 0 0 0 0  PHQ - 2 Score 0 0 0 0 0  Some recent data might be hidden    Assessment/Plan    1. Squamous cell carcinoma of retromolar trigone ([place]) He does have a follow-up with ENT next week.  I have asked him to discuss his discomfort with him, and I will follow along to review for any further evaluation as needed.  I am aware that his current ear nose and throat specialist is retiring soon, so this will likely be his last visit with him.  Last imaging of the head and neck was 2 years ago.  Uncertain of current pain provides indication for additional imaging.  2. Thoracic ascending aortic aneurysm: We discussed today that he is due for rescreening of his thoracic aortic aneurysm.  I did have a discussion with him about results of screening.  I do not think that he would be a good candidate for surgery if aneurysm had enlarged.  We decided that we would let him get his current pain evaluated with ENT next week and then determine follow-up evaluation plan from there.  If he is going to be getting other imaging, perhaps we can include the thoracic imaging at that time.  He does state that he would like to proceed with surgery if indicated for any cancer recurrence, but we would need to discuss further with regards to thoracic aortic aneurysm.  3. Acquired hypothyroidism Recent thyroid labs were normal.  Will check on a yearly basis.  4. Primary osteoarthritis involving multiple joints He tolerates arthritis okay.  He is not very active during the  day, but this is more related to physical deconditioning.  Physical therapy was offered to him, but he did not want to have this in his house.  He does not wish to work on exercises at home, although he is knowledgeable about what he can do to work on strength.  5. CLL (chronic lymphocytic leukemia) ([place]) He does follow with oncology regularly.  I would like to get some repeat blood work, but I am going to wait for his ENT evaluation before ordering this.  6. Severe protein-calorie malnutrition ([place]), BMI 17 He has actually gained weight.  Additionally, since his  last visit with me, he is walking with a walker (previously was in a wheelchair).  Strength is still diminished as he is unable to walk very far, but he was able to make it to his room today in the office.  7. History of bladder cancer Has followed with oncology no current concerns with urination.  8. Fatigue, unspecified type Stable.  Allergy intake has been stable.  9. Other infective acute otitis externa of right ear We will treat with eardrops.  He has follow-up with ENT for recheck next week. Return for pending provider review of records.     Micheline Rough, MD

## 2019-08-18 DIAGNOSIS — M47816 Spondylosis without myelopathy or radiculopathy, lumbar region: Secondary | ICD-10-CM | POA: Diagnosis not present

## 2019-08-18 DIAGNOSIS — M542 Cervicalgia: Secondary | ICD-10-CM | POA: Diagnosis not present

## 2019-08-18 DIAGNOSIS — M546 Pain in thoracic spine: Secondary | ICD-10-CM | POA: Diagnosis not present

## 2019-08-18 DIAGNOSIS — R030 Elevated blood-pressure reading, without diagnosis of hypertension: Secondary | ICD-10-CM | POA: Diagnosis not present

## 2019-08-18 MED ORDER — CORTISPORIN-TC 3.3-3-10-0.5 MG/ML OT SUSP: 4.0000 [drp] | Freq: Four times a day (QID) | OTIC | 0 refills | Status: DC

## 2019-08-30 ENCOUNTER — Encounter: Payer: Self-pay | Admitting: Family Medicine

## 2019-09-01 ENCOUNTER — Ambulatory Visit
Admission: RE | Admit: 2019-09-01 | Discharge: 2019-09-01 | Disposition: A | Discharge status: Home / Self Care | Payer: Medicare Other | Source: Ambulatory Visit | Attending: Neurosurgery | Admitting: Neurosurgery

## 2019-09-01 ENCOUNTER — Other Ambulatory Visit: Payer: Self-pay | Admitting: Neurosurgery

## 2019-09-01 DIAGNOSIS — M47816 Spondylosis without myelopathy or radiculopathy, lumbar region: Secondary | ICD-10-CM

## 2019-09-01 DIAGNOSIS — M542 Cervicalgia: Secondary | ICD-10-CM

## 2019-09-01 DIAGNOSIS — M545 Low back pain: Secondary | ICD-10-CM | POA: Diagnosis not present

## 2019-09-06 DIAGNOSIS — R1312 Dysphagia, oropharyngeal phase: Secondary | ICD-10-CM | POA: Diagnosis not present

## 2019-09-06 DIAGNOSIS — H6123 Impacted cerumen, bilateral: Secondary | ICD-10-CM | POA: Diagnosis not present

## 2019-09-06 DIAGNOSIS — R1314 Dysphagia, pharyngoesophageal phase: Secondary | ICD-10-CM | POA: Diagnosis not present

## 2019-09-09 ENCOUNTER — Other Ambulatory Visit: Payer: Self-pay | Admitting: Otolaryngology

## 2019-09-09 DIAGNOSIS — R1312 Dysphagia, oropharyngeal phase: Secondary | ICD-10-CM

## 2019-09-14 ENCOUNTER — Telehealth: Payer: Self-pay | Admitting: Medical

## 2019-09-14 ENCOUNTER — Other Ambulatory Visit: Payer: Self-pay

## 2019-09-14 ENCOUNTER — Inpatient Hospital Stay: Payer: Medicare Other | Attending: Medical | Admitting: Medical

## 2019-09-14 VITALS — BP 154/75 | HR 72 | Temp 98.9°F | Resp 17 | Ht 67.0 in | Wt 132.0 lb

## 2019-09-14 DIAGNOSIS — M546 Pain in thoracic spine: Secondary | ICD-10-CM | POA: Diagnosis not present

## 2019-09-14 DIAGNOSIS — Z9221 Personal history of antineoplastic chemotherapy: Secondary | ICD-10-CM | POA: Diagnosis not present

## 2019-09-14 DIAGNOSIS — G629 Polyneuropathy, unspecified: Secondary | ICD-10-CM

## 2019-09-14 DIAGNOSIS — C148 Malignant neoplasm of overlapping sites of lip, oral cavity and pharynx: Secondary | ICD-10-CM | POA: Diagnosis not present

## 2019-09-14 DIAGNOSIS — Z8572 Personal history of non-Hodgkin lymphomas: Secondary | ICD-10-CM | POA: Insufficient documentation

## 2019-09-14 DIAGNOSIS — Z85818 Personal history of malignant neoplasm of other sites of lip, oral cavity, and pharynx: Secondary | ICD-10-CM | POA: Diagnosis not present

## 2019-09-14 DIAGNOSIS — M542 Cervicalgia: Secondary | ICD-10-CM | POA: Diagnosis not present

## 2019-09-14 DIAGNOSIS — M4800 Spinal stenosis, site unspecified: Secondary | ICD-10-CM

## 2019-09-14 DIAGNOSIS — M47816 Spondylosis without myelopathy or radiculopathy, lumbar region: Secondary | ICD-10-CM | POA: Diagnosis not present

## 2019-09-14 MED ORDER — GABAPENTIN 100 MG PO CAPS: 100.0000 mg | ORAL_CAPSULE | Freq: Three times a day (TID) | ORAL | 5 refills | Status: AC

## 2019-09-14 NOTE — Telephone Encounter (Signed)
Scheduled per los. Gave avs and calendar  

## 2019-09-14 NOTE — Patient Instructions (Signed)

## 2019-09-14 NOTE — Progress Notes (Signed)
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 Biopsy was performed on the right tonsil   05/24/2014 Pathology Results   Accession: [id number] right tonsil biopsy confirmed squamous cell carcinoma.   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   09/04/2014 - 10/13/2014 Radiation Therapy   Rec'd Helical IMRT to right retromolar region (tumor bed) and right neck: 60 Gy in 30 fractions to tumor bed and 54 Gy in 30 fractions to neck.   01/04/2015 Imaging   CT Neck:  No residual oral cavity tumor or lymphadenopathy identified.                              01/04/2015 Imaging   CT Chest:  No evidence of metastatic disease.   Pulmonary  nodule in right base has remained stable since 2004 and is compatible with a benign process.    History of bladder cancer  02/02/2015 Surgery   He underwent TURBT   02/02/2015 Pathology Results   Accession: [id number] TURBT result showed high grade urothelial cancer   03/27/2015 Surgery   He had Cystoscopy, bladder biopsy and fulguration, 2 - 5 cm   03/27/2015 Pathology Results   Accession: [id number] pathology was negative for residual disease   09/26/2016 Imaging   CT angiogram showed ascending thoracic aortic aneurysm which is stable in size compared to prior exam based on my own measurements. Stable right lower lobe pulmonary nodule. Stable moderate size hiatal hernia         INTERVAL HISTORY:    Timothy Kim is a 86 y.o. 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 He presents to clinic today for routine follow up having last been seen on 07/26/2019 by Dr. Eppie Gibson . He continues to have to puree all his foods. He has right face/cheek swelling which has been stable to the past 8 to 9 months. He states that he has not been given a satisfactory explanation for this swelling. He was seen by his EENT last week. He has ongoing right neck pain with turning of his neck. He has neck and spinal pain due to spinal stenosis. He continues  to seen by the pain clinic. He has difficulty raising his right upper extremity due to a dislocated right shoulder which also limited his over all range of motion. 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 He sees his PCP regularly and has a TSH measured at least annually. His most recent TSH was normal. He does not require supplementation for his thyroid. He continues to drink 3 to 4, two once alcoholic drinks daily. He drinks vodka.  He has difficulty chewing as his upper and lower jaws have been misaligned since surgery.  He has not seen a dentist since his cancer diagnosis. He uses a fluoride rinse regularly.  REVIEW OF SYSTEMS:   Visual changes:    no   Swelling of the face:    yes   Swelling of the mouth:    yes   Swelling of the throat:    no   Dental problems:    yes   Skin changes at treatment site:  yes   Dry mouth:     yes   Thickened saliva:    yes   Fatigue:     no   Pain or difficulty swallowing:   yes     Loss of appetite:    no   Inability to speak:    no   Impaired speech:    no   Numbness of the ear    Numbness of the right ear and face     REVIEW OF SYSTEMS:   Difficulty chewing:    yes   Weakness raising arm(s):   yes    Lack of movement of the upper lip:  no   Facial disfigurement:    no   Hearing difficulties:    yes   Earwax accumulation:   no   Difficulty opening mouth:   yes   Pain:      yes  Neck and spine pain due to spinal stenosis   Pain location:      Neck and spine pain due to spinal stenosis   Severity:      severe      -Weight: (LOSS/GAIN) since (last visit)   Weight stable at 132 pounds     -Last ENT visit:    Last week   -Last Rad Onc visit:   07/26/2019  -Last Dentist visit:   Prior to cancer diagnosis      CURRENT MEDICATIONS:    Current Outpatient Medications on File Prior to Visit  Medication Sig Dispense Refill  . brimonidine-timolol (COMBIGAN) 0.2-0.5 % ophthalmic solution Place 1 drop into both eyes 2 (two) times daily.     . dorzolamide (TRUSOPT) 2 % ophthalmic solution PLACE 1 DROP INTO BOTH EYES TWICE DAILY    . feeding supplement, ENSURE ENLIVE, (ENSURE ENLIVE) LIQD Take 237 mLs by mouth 2 (two) times daily between meals. 237 mL 12  . gabapentin (NEURONTIN) 100 MG capsule Take 1 capsule (100 mg total) by mouth 3 (three) times daily. 90 capsule 0  . latanoprost (XALATAN) 0.005 % ophthalmic solution Place 1 drop into both eyes every evening.   4  . midodrine (PROAMATINE) 10 MG tablet TAKE 1 TABLET BY MOUTH 3 TIMES A  DAY WITH MEALS 270 tablet 0  . Multiple Vitamin (MULTIVITAMIN WITH MINERALS) TABS tablet Take 1 tablet by mouth daily. 30 tablet 11  . neomycin-colistin-hydrocortisone-thonzonium (CORTISPORIN-TC) 3.08-23-08-0.5 MG/ML OTIC suspension Place 4 drops into the right ear 4 (four) times  daily. 10 mL 0  . RHOPRESSA 0.02 % SOLN Place 1 drop into the right eye daily.    . traMADol (ULTRAM) 50 MG tablet tramadol 50 mg tablet     No current facility-administered medications on file prior to visit.        ALLERGIES:    No Known Allergies      ADDITIONAL REVIEW OF SYSTEMS:    ROS                                                                        PHYSICAL EXAM:    There were no vitals filed for this visit.   There were no vitals filed for this visit.      Weight   Date   Pre-treatment (RT consult date):       General: Timothy Kim is a 86 y.o. male who appears to be in no acute distress.? Timothy Kim is accompanied by his wife.   HEENT:    Head: atraumatic and normocephalic.?  Soft right cheek swelling without nodeyules, masses, or skin changes.  Pupils: equal with  EOM intact.  Conjunctivae: clear without exudate.?    Sclerae: anicteric.    Oral mucosa: pink and moist without lesions.?    Tongue: pink, moist, and midline.    Oropharynx: pink and moist, without lesions.   Lymph: No lymph nodes appreciated in the right cervical area due to surgery. No preauricular, postauricular, cervical, noted on the left with no bilateral supraclavicular, or infraclavicular lymphadenopathy noted on palpation. ?   Neck: No palpable masses. Skin on neck is thichened and hyperpigmented consistent with surgery and radiation.    Cardiovascular: Regular rate and rhythm without murmurs, rubs, or gallops.Marland Kitchen   Respiratory: Clear to auscultation bilaterally without wheezes, rales, or ronchi. Chest expansion symmetric without accessory  muscle use; breathing non-labored.?   GI:  Bowel sounds normoactive.   GU: Deferred. ?   Neuro: No focal deficits. The patient is ambulating with a wheelchair. ?   Psych: Normal mood and affect for situation.   Extremities: No lymphedema.?    LABORATORY DATA:    The patient's complete chemistry panel returned showing:   Lab Results  Component Value Date   NA 137 03/24/2018   NA 135 09/19/2016   NA 135 (L) 10/16/2015   K 4.2 03/24/2018   K 3.0 (LL) 10/16/2015   CL 100 03/24/2018   CO2 31 03/24/2018   CO2 36 (H) 10/16/2015   GLUCOSE 93 03/24/2018   GLUCOSE 129 10/16/2015   BUN 13 03/24/2018   BUN 15 09/19/2016   BUN 11.8 10/16/2015   CREATININE 0.61 03/24/2018   CREATININE 1.27 10/09/2017   CREATININE 0.79 04/03/2016   CREATININE 1.1 10/16/2015   ALBUMIN 3.3 (L) 03/21/2018   ALBUMIN 3.4 (L) 10/16/2015   ALKPHOS 84 03/21/2018   ALKPHOS 104 10/16/2015   ALT 11 03/21/2018   ALT 20 10/16/2015   AST 31 03/21/2018   AST 49 (H) 10/16/2015   BILITOT 1.4 (H) 03/21/2018   BILITOT 0.81 10/16/2015        The patient's TSH returned at:  Lab Results  Component Value Date   TSH 2.923 07/26/2019   TSH 3.92 08/12/2017  The patient's complete blood count returned showing:      Component Value Date/Time   WBC 8.4 03/22/2018 0525   RBC 3.25 (L) 03/22/2018 0525   HGB 11.7 (L) 03/22/2018 0525   HGB 12.1 (L) 10/15/2016 1041   HCT 33.9 (L) 03/22/2018 0525   HCT 34.7 (L) 10/15/2016 1041   PLT 180 03/22/2018 0525   PLT 217 10/15/2016 1041   MCV 104.3 (H) 03/22/2018 0525   MCV 105.7 (H) 10/15/2016 1041   MCH 36.0 (H) 03/22/2018 0525   MCHC 34.5 03/22/2018 0525   RDW 13.6 03/22/2018 0525   RDW 13.6 10/15/2016 1041   LYMPHSABS 2.5 03/21/2018 1018   LYMPHSABS 2.8 10/15/2016 1041   MONOABS 0.6 03/21/2018 1018   MONOABS 1.0 (H) 10/15/2016 1041   EOSABS 0.0 03/21/2018 1018   EOSABS 0.1 10/15/2016 1041   BASOSABS 0.0 03/21/2018 1018   BASOSABS 0.1 10/15/2016 1041         DIAGNOSTIC IMAGING:    DG Cervical Spine Complete  Result Date: 09/02/2019 CLINICAL DATA:  Neck pain x2 months, history of jaw tumor EXAM: CERVICAL SPINE - COMPLETE 4+ VIEW COMPARISON:  Partial comparison to CT neck dated 10/09/2017. FINDINGS: Reversal of the normal lower cervical lordosis. No evidence of fracture or dislocation of the cervical spine. Vertebral body heights are maintained. Dens is suboptimally evaluated. Mild multilevel degenerative changes with partial C5-6 fusion. No prevertebral soft tissue swelling. Stable deformity with surgical clips involving the right mandible. 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 IMPRESSION: Mild degenerative changes of the cervical spine with partial C5-6 fusion. Stable right mandible deformity with postsurgical changes and old nonunited right mandible fracture. Electronically Signed   By: Julian Hy M.D.   On: 09/02/2019 08:44   DG Lumbar Spine Complete  Result Date: 09/02/2019 CLINICAL DATA:  Chronic low back pain EXAM: LUMBAR SPINE - COMPLETE 4+ VIEW COMPARISON:  09/10/2003 FINDINGS: Five lumbar-type vertebral bodies. Grade 1 anterolisthesis of L4 on L5, new from 2005. Mild lumbar dextroscoliosis, new from 2005. No evidence of fracture.  Vertebral body heights are maintained. Mild multilevel degenerative changes. IMPRESSION: Mild multilevel degenerative changes with dextroscoliosis. Grade 1 anterolisthesis of L4 on L5. Electronically Signed   By: Julian Hy M.D.   On: 09/02/2019 08:54                  ASSESSMENT & PLAN:    FADIL KEYSOR is a 86 y.o. 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 He was treated with radiation therapy which he completed treatment on 10/13/2014. He presents to the survivorship clinic today for routine follow-up having last been seen by Dr. Isidore Moos on 07/26/2019.        1. 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         #. Nutritional status: Timothy Kim reports that he is currently able to consume adequate nutrition by mouth although his meals are pureed. His weight is stable at 132 lbs today.  He was encouraged to continue to consume adequate hydration and nutrition, as tolerated.        #. At risk for dysphagia: Given Timothy Kim's treatment for Oral and Oropharyngeal Cancer (  Stage IVA (T4a, N0, M0), Invasive  SCCA right tonsil / retromolar trigone, p-16 negative), which included surgery and radiation therapy he continues to have chronic dysphagia. He continues to understand what foods he can consume and which foods cause difficulties.    #.  At risk for neck lymphedema:  When patients with head & neck cancers are treated with surgery and/or radiation therapy, there is an associated increased risk of neck lymphedema.  Timothy Kim reports that he currently is  experiencing symptoms. I reviewed with him today that his right soft facial swelling is likely due to lymphedema. He will return for follow up in 3 months. He will be referred for a CT scan of his face should this change. No CT scan will be done if there is no change. He also would be released from further follow up at his return if he has no changes in his health.      #.  At risk for hypothyroidism: The thyroid gland is often affected after treatment for head & neck cancer.  Timothy Kim most recent TSH was  Lab Results  Component Value Date   TSH 2.923 07/26/2019   TSH 3.92 08/12/2017   on (date).   We discussed that he will continue to have serial TSH monitoring for at least the next 5 years as part of his routine follow-up and post-cancer treatment care.  He is not currently on thyroid supplementation with  levothyroxine.      #. At risk for tooth decay/dental concerns: Patients often experience a dry mouth which increases their risk of cavities after treatment for head and neck cancers. Timothy Kim was encouraged to see his dentist 3-4 times per year. he should also continue to use his fluoride trays daily, as directed by dentistry.  I encouraged him to reach out to either Dr. Enrique Sack (oncology dentist) or his primary care dentist with additional questions or concerns.      #.  Lung cancer screening:  [facility] now offers eligible patients lung cancer screening with a low-dose chest CT to aid in early detection of lung cancers. Additionally, this may provide more effective treatment options and improved survival benefits through early detection.        Selection criteria for screening are as follows:    Medicare patients: 55-77 years   Privately insured patients 55-80 years.   Active or former smokers who have quit within the last 15 years.   30+ pack-year history of smoking    Exclusion criteria: No signs/symptoms of lung cancer (i.e., no recent history of coughing up blood and no unexplained weight loss >15 pounds in the last 6 months).   Must be willing and healthy enough to undergo biopsies and surgery if needed.      Timothy Kim currently does not meet criteria for lung cancer screening.     #. Tobacco & alcohol use: Timothy Kim does not have a history of smoking or tobacco use. He does consume alcohol.  drinks mixed drinks (vodka) and consumes 6 to 8 one ounce equivalents of alcohol per week. I  reinforced the importance of avoiding alcohol consumption.  Both tobacco and alcohol use in patients with head & neck cancer increases the risk of recurrence.  They also increase the risk of other cancers.  Timothy Kim voiced understanding of the importance of continuing of abstaining tobacco and alcohol.       #. Health maintenance and wellness promotion: Cancer patients have better  overall health and a decreased risk of cancer recurrence if  they consume a diet rich in fruits and vegetables. Timothy Kim was encouraged to consume 5-7 servings of fruits and vegetables per day, as tolerated. Timothy Kim was also encouraged to engage in moderate to vigorous exercise for 30 minutes per day most days of the week. Maintaining a healthy weight reduces the risk of cancer recurrences. he was encouraged to use sunscreen and wear protective clothing when in the sun.       #. Support services/counseling: It is not uncommon for this period of the patient's cancer care to be a time of many emotions and stressors.  We discussed an opportunity for him to participate in the next session of Head & Neck FYNN ("Finding Your New Normal") support group series. This group is designed for patients who have completed treatment. Timothy Kim was encouraged to take advantage of our many other support services, programs, support groups, and/or counseling in coping with his new life as a cancer survivor. Timothy Kim was offered support today through active listening and expressive supportive counseling. his questions and concerns were all addressed today.                     Dispo:    -See Lilyan Gilford, MD  (ENT) as scheduled.  -Return to cancer center to see Sandi Mealy, PA-C  in 3 months.   -Return to cancer center to see Survivorship provider in [employer], PA-C  in 3 months.              A total of 45 minutes was spent in the face-to-face care of this patient, with greater than 50% of that time spent in counseling and care-coordination.       Sandi Mealy, MHS, PA-C   Physician Assistant      09/14/19   10:45 AM         Note: PRIMARY CARE PROVIDER   Caren Macadam, [place]   [phone]

## 2019-09-20 ENCOUNTER — Other Ambulatory Visit: Payer: Medicare Other

## 2019-10-07 ENCOUNTER — Ambulatory Visit
Admission: RE | Admit: 2019-10-07 | Discharge: 2019-10-07 | Disposition: A | Discharge status: Home / Self Care | Payer: Medicare Other | Source: Ambulatory Visit | Attending: Otolaryngology | Admitting: Otolaryngology

## 2019-10-07 ENCOUNTER — Other Ambulatory Visit: Payer: Self-pay | Admitting: Otolaryngology

## 2019-10-07 DIAGNOSIS — K222 Esophageal obstruction: Secondary | ICD-10-CM | POA: Diagnosis not present

## 2019-10-07 DIAGNOSIS — R1312 Dysphagia, oropharyngeal phase: Secondary | ICD-10-CM

## 2019-10-17 ENCOUNTER — Ambulatory Visit: Payer: Medicare Other | Admitting: Podiatry

## 2019-10-31 ENCOUNTER — Other Ambulatory Visit: Payer: Self-pay | Admitting: Family Medicine

## 2019-11-08 ENCOUNTER — Telehealth: Payer: Self-pay | Admitting: Medical

## 2019-11-08 NOTE — Telephone Encounter (Signed)
R/s appt from 6/21 to 6/23. Provider on PAL

## 2019-12-12 ENCOUNTER — Ambulatory Visit: Payer: Medicare Other | Admitting: Medical

## 2019-12-14 ENCOUNTER — Inpatient Hospital Stay: Payer: Medicare Other | Attending: Medical | Admitting: Medical

## 2019-12-14 ENCOUNTER — Other Ambulatory Visit: Payer: Self-pay

## 2019-12-16 NOTE — Progress Notes (Signed)
Mr. Scadden presents to the clinic today with his wife.  He was seen most recently in the survivorship clinic.  He continues to have swelling and his right lateral face.  He is subsequently developed pain in his right neck with swallowing.  We discussed today that he would be referred for a CT scan of the head neck and chest.  This will be done given that he is having decreased mobility and his right upper arm.  He would be referred for a swallowing study and for physical therapy if his CT scan showed no evidence of recurrent disease.Marland Kitchen  He will be scheduled to be seen in follow-up once his CT scans have been completed.  He and his wife are agreeable to this plan.  Sandi Mealy, MHS, PA-C Physician Assistant

## 2019-12-23 ENCOUNTER — Ambulatory Visit: Payer: Medicare Other | Admitting: Podiatry

## 2019-12-27 ENCOUNTER — Other Ambulatory Visit: Payer: Self-pay | Admitting: Medical

## 2019-12-27 DIAGNOSIS — G5692 Unspecified mononeuropathy of left upper limb: Secondary | ICD-10-CM

## 2019-12-27 DIAGNOSIS — R911 Solitary pulmonary nodule: Secondary | ICD-10-CM | POA: Insufficient documentation

## 2019-12-27 DIAGNOSIS — G5691 Unspecified mononeuropathy of right upper limb: Secondary | ICD-10-CM

## 2019-12-27 DIAGNOSIS — C148 Malignant neoplasm of overlapping sites of lip, oral cavity and pharynx: Secondary | ICD-10-CM

## 2019-12-27 DIAGNOSIS — R1314 Dysphagia, pharyngoesophageal phase: Secondary | ICD-10-CM

## 2019-12-27 DIAGNOSIS — C062 Malignant neoplasm of retromolar area: Secondary | ICD-10-CM

## 2019-12-27 DIAGNOSIS — R29818 Other symptoms and signs involving the nervous system: Secondary | ICD-10-CM

## 2020-01-04 ENCOUNTER — Other Ambulatory Visit: Payer: Self-pay | Admitting: Emergency Medicine

## 2020-01-04 DIAGNOSIS — C062 Malignant neoplasm of retromolar area: Secondary | ICD-10-CM

## 2020-01-06 ENCOUNTER — Other Ambulatory Visit: Payer: Self-pay

## 2020-01-06 ENCOUNTER — Inpatient Hospital Stay: Payer: Medicare Other | Attending: Medical

## 2020-01-06 ENCOUNTER — Ambulatory Visit (HOSPITAL_COMMUNITY)
Admission: RE | Admit: 2020-01-06 | Discharge: 2020-01-06 | Disposition: A | Discharge status: Home / Self Care | Payer: Medicare Other | Source: Ambulatory Visit | Attending: Medical | Admitting: Medical

## 2020-01-06 DIAGNOSIS — C062 Malignant neoplasm of retromolar area: Secondary | ICD-10-CM

## 2020-01-06 DIAGNOSIS — G5691 Unspecified mononeuropathy of right upper limb: Secondary | ICD-10-CM | POA: Insufficient documentation

## 2020-01-06 DIAGNOSIS — C148 Malignant neoplasm of overlapping sites of lip, oral cavity and pharynx: Secondary | ICD-10-CM | POA: Insufficient documentation

## 2020-01-06 DIAGNOSIS — R29818 Other symptoms and signs involving the nervous system: Secondary | ICD-10-CM | POA: Insufficient documentation

## 2020-01-06 DIAGNOSIS — J984 Other disorders of lung: Secondary | ICD-10-CM | POA: Diagnosis not present

## 2020-01-06 DIAGNOSIS — C9110 Chronic lymphocytic leukemia of B-cell type not having achieved remission: Secondary | ICD-10-CM | POA: Insufficient documentation

## 2020-01-06 DIAGNOSIS — R911 Solitary pulmonary nodule: Secondary | ICD-10-CM | POA: Insufficient documentation

## 2020-01-06 DIAGNOSIS — G5692 Unspecified mononeuropathy of left upper limb: Secondary | ICD-10-CM | POA: Insufficient documentation

## 2020-01-06 DIAGNOSIS — R1314 Dysphagia, pharyngoesophageal phase: Secondary | ICD-10-CM

## 2020-01-06 DIAGNOSIS — I700 Atherosclerosis of aorta: Secondary | ICD-10-CM | POA: Diagnosis not present

## 2020-01-06 DIAGNOSIS — J387 Other diseases of larynx: Secondary | ICD-10-CM | POA: Diagnosis not present

## 2020-01-06 DIAGNOSIS — I712 Thoracic aortic aneurysm, without rupture: Secondary | ICD-10-CM | POA: Diagnosis not present

## 2020-01-06 DIAGNOSIS — J392 Other diseases of pharynx: Secondary | ICD-10-CM | POA: Diagnosis not present

## 2020-01-06 DIAGNOSIS — I7090 Unspecified atherosclerosis: Secondary | ICD-10-CM | POA: Diagnosis not present

## 2020-01-06 DIAGNOSIS — G9389 Other specified disorders of brain: Secondary | ICD-10-CM | POA: Diagnosis not present

## 2020-01-06 DIAGNOSIS — K118 Other diseases of salivary glands: Secondary | ICD-10-CM | POA: Diagnosis not present

## 2020-01-06 DIAGNOSIS — R9082 White matter disease, unspecified: Secondary | ICD-10-CM | POA: Diagnosis not present

## 2020-01-06 LAB — CBC WITH DIFFERENTIAL (CANCER CENTER ONLY)
Abs Immature Granulocytes: 0.03 10*3/uL (ref 0.00–0.07)
Basophils Absolute: 0 10*3/uL (ref 0.0–0.1)
Basophils Relative: 0 %
Eosinophils Absolute: 0.1 10*3/uL (ref 0.0–0.5)
Eosinophils Relative: 1 %
HCT: 39 % (ref 39.0–52.0)
Hemoglobin: 13 g/dL (ref 13.0–17.0)
Immature Granulocytes: 0 %
Lymphocytes Relative: 29 %
Lymphs Abs: 3.3 10*3/uL (ref 0.7–4.0)
MCH: 33.8 pg (ref 26.0–34.0)
MCHC: 33.3 g/dL (ref 30.0–36.0)
MCV: 101.3 fL — ABNORMAL HIGH (ref 80.0–100.0)
Monocytes Absolute: 0.8 10*3/uL (ref 0.1–1.0)
Monocytes Relative: 7 %
Neutro Abs: 6.9 10*3/uL (ref 1.7–7.7)
Neutrophils Relative %: 63 %
Platelet Count: 189 10*3/uL (ref 150–400)
RBC: 3.85 MIL/uL — ABNORMAL LOW (ref 4.22–5.81)
RDW: 13.8 % (ref 11.5–15.5)
WBC Count: 11.1 10*3/uL — ABNORMAL HIGH (ref 4.0–10.5)
nRBC: 0 % (ref 0.0–0.2)

## 2020-01-06 LAB — CMP (CANCER CENTER ONLY)
ALT: 10 U/L (ref 0–44)
AST: 21 U/L (ref 15–41)
Albumin: 3.6 g/dL (ref 3.5–5.0)
Alkaline Phosphatase: 87 U/L (ref 38–126)
Anion gap: 12 (ref 5–15)
BUN: 17 mg/dL (ref 8–23)
CO2: 29 mmol/L (ref 22–32)
Calcium: 9.9 mg/dL (ref 8.9–10.3)
Chloride: 101 mmol/L (ref 98–111)
Creatinine: 0.88 mg/dL (ref 0.61–1.24)
GFR, Est AFR Am: >60 mL/min (ref >60)
GFR, Estimated: >60 mL/min (ref >60)
Glucose, Bld: 98 mg/dL (ref 70–99)
Potassium: 4.1 mmol/L (ref 3.5–5.1)
Sodium: 142 mmol/L (ref 135–145)
Total Bilirubin: 0.7 mg/dL (ref 0.3–1.2)
Total Protein: 7.1 g/dL (ref 6.5–8.1)

## 2020-01-06 MED ORDER — SODIUM CHLORIDE (PF) 0.9 % IJ SOLN
INTRAMUSCULAR | Status: AC
  Filled 2020-01-06: qty 50

## 2020-01-06 MED ORDER — IOHEXOL 300 MG/ML  SOLN
100.0000 mL | Freq: Once | INTRAMUSCULAR | Status: AC | PRN
  Administered 2020-01-06: 75 mL via INTRAVENOUS

## 2020-01-11 NOTE — Progress Notes (Signed)
These results were called to Molli Posey and were reviewed with him . His were answered. He expressed understanding.

## 2020-01-17 ENCOUNTER — Other Ambulatory Visit: Payer: Self-pay | Admitting: Medical

## 2020-01-17 DIAGNOSIS — C062 Malignant neoplasm of retromolar area: Secondary | ICD-10-CM

## 2020-01-17 DIAGNOSIS — R1319 Other dysphagia: Secondary | ICD-10-CM

## 2020-01-27 ENCOUNTER — Other Ambulatory Visit: Payer: Self-pay | Admitting: Family Medicine

## 2020-02-09 ENCOUNTER — Ambulatory Visit (INDEPENDENT_AMBULATORY_CARE_PROVIDER_SITE_OTHER): Payer: Medicare Other | Admitting: Otolaryngology

## 2020-03-23 ENCOUNTER — Telehealth: Payer: Self-pay | Admitting: *Deleted

## 2020-03-23 DIAGNOSIS — C148 Malignant neoplasm of overlapping sites of lip, oral cavity and pharynx: Secondary | ICD-10-CM

## 2020-03-23 DIAGNOSIS — C062 Malignant neoplasm of retromolar area: Secondary | ICD-10-CM

## 2020-03-23 DIAGNOSIS — C9110 Chronic lymphocytic leukemia of B-cell type not having achieved remission: Secondary | ICD-10-CM

## 2020-03-23 DIAGNOSIS — Z8551 Personal history of malignant neoplasm of bladder: Secondary | ICD-10-CM

## 2020-03-23 NOTE — Telephone Encounter (Signed)
Home number busy x2.

## 2020-03-23 NOTE — Telephone Encounter (Signed)
We at least need a virtual visit. I don't see any notes indicating cause of this decline so I do think that talking through current symptoms is important. Is he eating/drinking? Any sick symptoms? Who is helping her with his care if he is non-ambulatory? If we can get more information, that would be great. I don't mind putting in hospice referral but just want to make sure no other eval is needed?

## 2020-03-23 NOTE — Telephone Encounter (Signed)
Patients partner Bill Salinas left a message on her chart stating to call her regarding the pt.  I spoke with Ms Timothy Kim as Dr Ethlyn Gallery is seeing pts at this time and she stated the pt is sick and needs a referral for hospice care.  She stated the pt cannot walk or use his hands and is now wheelchair bound for the past 4 weeks.  I advised her the pt needs a visit or should go to the ER and she declined and stated the previous PCP set this up for him before.  Message sent to PCP.

## 2020-03-23 NOTE — Telephone Encounter (Signed)
(  Health has really gone downhill. Not able to walk now. In past had been in hospice and someone was coming out to check vitals, etc. Wondering if we could do this again.   *can barely stand up to get to wheelchair with assistance. Health has gone downhill. States she can't bring him in because he can't get down stairs and they have stairs to leave house.  *no feeling in his hands, no energy. States she makes him energy shake in morning, but not helping him. Very weak. No fevers, chills. She states that she hasn't seen him like this.  *wearing pampers for a month. Incontinent of urine and hasn't had bm in 2-3 weeks. No abd pain, doesn't complain; very content.  *patient on phone states that he has pain in his throat so that he can swallow.   She doesn't have cuff or instruments to check vital signs. Sleeping ok. )   Mechele Claude - can we put in a hospice referral using his cancer diagnoses? I understand that she does not want to take him to hospital for eval and have him wait there. I am hoping that they would agree for home visit and we might be able to get some evaluation/bloodwork through them rather than having him in hospital. He states that he does not want surgery if needed (when we discussed cervical narrowing on most recent CT). He has difficulty with swallowing and can't chew. I do feel hospice is appropriate at this point. They are ok to wait through weekend as he is comfortable generally and he has no acute concerns, but I would like for them to see him in home next week if possible.

## 2020-03-23 NOTE — Telephone Encounter (Signed)
I reached her on home line.

## 2020-03-26 ENCOUNTER — Telehealth: Payer: Self-pay | Admitting: Family Medicine

## 2020-03-26 NOTE — Telephone Encounter (Signed)
I called [facility] at [phone] as the number below was not in service and informed Horris Latino of the message below.

## 2020-03-26 NOTE — Telephone Encounter (Signed)
Horris Latino from [facility] said the referral diagnoses need to say the pt  is terminal for him to be seen quickly; please call bonnie at [phone] ext [phone]

## 2020-03-26 NOTE — Telephone Encounter (Signed)
Referral placed.

## 2020-03-26 NOTE — Telephone Encounter (Signed)
Using my best judgement I would say he is terminal. I spoke with wife last Friday and he is fairly comfortable except for jaw pain. I think that if he can be seen by weds this week that would be great.

## 2020-03-27 DIAGNOSIS — M1990 Unspecified osteoarthritis, unspecified site: Secondary | ICD-10-CM | POA: Diagnosis not present

## 2020-03-27 DIAGNOSIS — S81812D Laceration without foreign body, left lower leg, subsequent encounter: Secondary | ICD-10-CM | POA: Diagnosis not present

## 2020-03-27 DIAGNOSIS — I951 Orthostatic hypotension: Secondary | ICD-10-CM | POA: Diagnosis not present

## 2020-03-27 DIAGNOSIS — C062 Malignant neoplasm of retromolar area: Secondary | ICD-10-CM | POA: Diagnosis not present

## 2020-03-27 DIAGNOSIS — I10 Essential (primary) hypertension: Secondary | ICD-10-CM | POA: Diagnosis not present

## 2020-03-27 DIAGNOSIS — Z85820 Personal history of malignant melanoma of skin: Secondary | ICD-10-CM | POA: Diagnosis not present

## 2020-03-27 DIAGNOSIS — H3530 Unspecified macular degeneration: Secondary | ICD-10-CM | POA: Diagnosis not present

## 2020-03-27 DIAGNOSIS — I714 Abdominal aortic aneurysm, without rupture: Secondary | ICD-10-CM | POA: Diagnosis not present

## 2020-03-27 DIAGNOSIS — C9110 Chronic lymphocytic leukemia of B-cell type not having achieved remission: Secondary | ICD-10-CM | POA: Diagnosis not present

## 2020-03-27 DIAGNOSIS — R634 Abnormal weight loss: Secondary | ICD-10-CM | POA: Diagnosis not present

## 2020-03-27 DIAGNOSIS — M4802 Spinal stenosis, cervical region: Secondary | ICD-10-CM | POA: Diagnosis not present

## 2020-03-27 DIAGNOSIS — H409 Unspecified glaucoma: Secondary | ICD-10-CM | POA: Diagnosis not present

## 2020-03-27 DIAGNOSIS — Z8551 Personal history of malignant neoplasm of bladder: Secondary | ICD-10-CM | POA: Diagnosis not present

## 2020-03-27 DIAGNOSIS — M75101 Unspecified rotator cuff tear or rupture of right shoulder, not specified as traumatic: Secondary | ICD-10-CM | POA: Diagnosis not present

## 2020-03-27 DIAGNOSIS — R1310 Dysphagia, unspecified: Secondary | ICD-10-CM | POA: Diagnosis not present

## 2020-03-29 DIAGNOSIS — C9110 Chronic lymphocytic leukemia of B-cell type not having achieved remission: Secondary | ICD-10-CM | POA: Diagnosis not present

## 2020-03-29 DIAGNOSIS — R1310 Dysphagia, unspecified: Secondary | ICD-10-CM | POA: Diagnosis not present

## 2020-03-29 DIAGNOSIS — C062 Malignant neoplasm of retromolar area: Secondary | ICD-10-CM | POA: Diagnosis not present

## 2020-03-29 DIAGNOSIS — R634 Abnormal weight loss: Secondary | ICD-10-CM | POA: Diagnosis not present

## 2020-03-29 DIAGNOSIS — Z8551 Personal history of malignant neoplasm of bladder: Secondary | ICD-10-CM | POA: Diagnosis not present

## 2020-03-29 DIAGNOSIS — I714 Abdominal aortic aneurysm, without rupture: Secondary | ICD-10-CM | POA: Diagnosis not present

## 2020-04-02 DIAGNOSIS — R1310 Dysphagia, unspecified: Secondary | ICD-10-CM | POA: Diagnosis not present

## 2020-04-02 DIAGNOSIS — R634 Abnormal weight loss: Secondary | ICD-10-CM | POA: Diagnosis not present

## 2020-04-02 DIAGNOSIS — I714 Abdominal aortic aneurysm, without rupture: Secondary | ICD-10-CM | POA: Diagnosis not present

## 2020-04-02 DIAGNOSIS — C9110 Chronic lymphocytic leukemia of B-cell type not having achieved remission: Secondary | ICD-10-CM | POA: Diagnosis not present

## 2020-04-02 DIAGNOSIS — C062 Malignant neoplasm of retromolar area: Secondary | ICD-10-CM | POA: Diagnosis not present

## 2020-04-02 DIAGNOSIS — Z8551 Personal history of malignant neoplasm of bladder: Secondary | ICD-10-CM | POA: Diagnosis not present

## 2020-04-04 DIAGNOSIS — C062 Malignant neoplasm of retromolar area: Secondary | ICD-10-CM | POA: Diagnosis not present

## 2020-04-04 DIAGNOSIS — R1310 Dysphagia, unspecified: Secondary | ICD-10-CM | POA: Diagnosis not present

## 2020-04-04 DIAGNOSIS — R634 Abnormal weight loss: Secondary | ICD-10-CM | POA: Diagnosis not present

## 2020-04-04 DIAGNOSIS — I714 Abdominal aortic aneurysm, without rupture: Secondary | ICD-10-CM | POA: Diagnosis not present

## 2020-04-04 DIAGNOSIS — Z8551 Personal history of malignant neoplasm of bladder: Secondary | ICD-10-CM | POA: Diagnosis not present

## 2020-04-04 DIAGNOSIS — C9110 Chronic lymphocytic leukemia of B-cell type not having achieved remission: Secondary | ICD-10-CM | POA: Diagnosis not present

## 2020-04-06 DIAGNOSIS — C9110 Chronic lymphocytic leukemia of B-cell type not having achieved remission: Secondary | ICD-10-CM | POA: Diagnosis not present

## 2020-04-06 DIAGNOSIS — R1310 Dysphagia, unspecified: Secondary | ICD-10-CM | POA: Diagnosis not present

## 2020-04-06 DIAGNOSIS — R634 Abnormal weight loss: Secondary | ICD-10-CM | POA: Diagnosis not present

## 2020-04-06 DIAGNOSIS — C062 Malignant neoplasm of retromolar area: Secondary | ICD-10-CM | POA: Diagnosis not present

## 2020-04-06 DIAGNOSIS — Z8551 Personal history of malignant neoplasm of bladder: Secondary | ICD-10-CM | POA: Diagnosis not present

## 2020-04-06 DIAGNOSIS — I714 Abdominal aortic aneurysm, without rupture: Secondary | ICD-10-CM | POA: Diagnosis not present

## 2020-04-09 DIAGNOSIS — I714 Abdominal aortic aneurysm, without rupture: Secondary | ICD-10-CM | POA: Diagnosis not present

## 2020-04-09 DIAGNOSIS — Z8551 Personal history of malignant neoplasm of bladder: Secondary | ICD-10-CM | POA: Diagnosis not present

## 2020-04-09 DIAGNOSIS — R634 Abnormal weight loss: Secondary | ICD-10-CM | POA: Diagnosis not present

## 2020-04-09 DIAGNOSIS — R1310 Dysphagia, unspecified: Secondary | ICD-10-CM | POA: Diagnosis not present

## 2020-04-09 DIAGNOSIS — C062 Malignant neoplasm of retromolar area: Secondary | ICD-10-CM | POA: Diagnosis not present

## 2020-04-09 DIAGNOSIS — C9110 Chronic lymphocytic leukemia of B-cell type not having achieved remission: Secondary | ICD-10-CM | POA: Diagnosis not present

## 2020-04-12 DIAGNOSIS — R634 Abnormal weight loss: Secondary | ICD-10-CM | POA: Diagnosis not present

## 2020-04-12 DIAGNOSIS — R1310 Dysphagia, unspecified: Secondary | ICD-10-CM | POA: Diagnosis not present

## 2020-04-12 DIAGNOSIS — I714 Abdominal aortic aneurysm, without rupture: Secondary | ICD-10-CM | POA: Diagnosis not present

## 2020-04-12 DIAGNOSIS — C062 Malignant neoplasm of retromolar area: Secondary | ICD-10-CM | POA: Diagnosis not present

## 2020-04-12 DIAGNOSIS — Z8551 Personal history of malignant neoplasm of bladder: Secondary | ICD-10-CM | POA: Diagnosis not present

## 2020-04-12 DIAGNOSIS — C9110 Chronic lymphocytic leukemia of B-cell type not having achieved remission: Secondary | ICD-10-CM | POA: Diagnosis not present

## 2020-04-13 ENCOUNTER — Telehealth: Payer: Self-pay | Admitting: Family Medicine

## 2020-04-13 NOTE — Telephone Encounter (Signed)
Left a message for the pts friend to return my call.

## 2020-04-13 NOTE — Telephone Encounter (Signed)
Please see how blood pressures are doing?  *has hospice nurse evaluated this? *is he eating/drinking? Any other sx?  *may be best for [place] to have virtual visit to talk through everything since I feel a lot has changed since I last saw him. If you could get him put on end of schedule tomorrow; that would give [place] time to talk through concerns.

## 2020-04-13 NOTE — Telephone Encounter (Signed)
Pt friend call and want a call back about pt hands being numb  they have been numb for the last 2 wk's spoke with nurse and didn't want a appt but want dr.Koberlein to call her.

## 2020-04-16 NOTE — Telephone Encounter (Signed)
Patients friend Ms Cato Mulligan called back and stated the nurse checked the pts BP at home and told her the numbers were good.  Stated she has these written down and cannot locate the readings at this time.  Stated the pt is not eating much, drinking a shake and orange juice for lunch, ice cream and cookies for supper.  I offered a virtual visit with Dr Maudie Mercury for tomorrow and the pt stated he would prefer to await the visit on 11/3 with Dr Ethlyn Gallery.  Message sent to PCP.

## 2020-04-17 ENCOUNTER — Telehealth: Payer: Self-pay

## 2020-04-17 DIAGNOSIS — I714 Abdominal aortic aneurysm, without rupture: Secondary | ICD-10-CM | POA: Diagnosis not present

## 2020-04-17 DIAGNOSIS — R634 Abnormal weight loss: Secondary | ICD-10-CM | POA: Diagnosis not present

## 2020-04-17 DIAGNOSIS — C9110 Chronic lymphocytic leukemia of B-cell type not having achieved remission: Secondary | ICD-10-CM | POA: Diagnosis not present

## 2020-04-17 DIAGNOSIS — C062 Malignant neoplasm of retromolar area: Secondary | ICD-10-CM | POA: Diagnosis not present

## 2020-04-17 DIAGNOSIS — R1310 Dysphagia, unspecified: Secondary | ICD-10-CM | POA: Diagnosis not present

## 2020-04-17 DIAGNOSIS — Z8551 Personal history of malignant neoplasm of bladder: Secondary | ICD-10-CM | POA: Diagnosis not present

## 2020-04-17 NOTE — Telephone Encounter (Signed)
Nurse Almyra Free calling from [facility] wanting to know will Dr. Inocente Salles put in a order for the patients wife to pick an flu injection from the pharmacy so the nurse can give to the patient    Please call and advise if this can be done

## 2020-04-17 NOTE — Telephone Encounter (Signed)
Noted  

## 2020-04-18 ENCOUNTER — Telehealth: Payer: Self-pay | Admitting: *Deleted

## 2020-04-18 ENCOUNTER — Other Ambulatory Visit: Payer: Self-pay | Admitting: Family Medicine

## 2020-04-18 MED ORDER — FLUZONE HIGH-DOSE 0.5 ML IM SUSY: 0.5000 mL | PREFILLED_SYRINGE | Freq: Once | INTRAMUSCULAR | 0 refills | Status: AC

## 2020-04-18 NOTE — Telephone Encounter (Signed)
Rx has been sent; let me know if issue.

## 2020-04-18 NOTE — Telephone Encounter (Signed)
I'm ok with him trying mucinex - 600mg  PO BID prn. Let me know if this isn't working. If he is not taking in as much fluid it may not be as helpful. Also just make sure no chest congestion that needs to be further evaluated by home hospice nurse.

## 2020-04-18 NOTE — Telephone Encounter (Signed)
Almyra Free called from Hospice stating patient is having a lot of flem in his throat. Almyra Free wants to know if mucinex would help with that. Please call Almyra Free [phone]

## 2020-04-18 NOTE — Telephone Encounter (Signed)
[facility] for this; haven't done this before? Not sure how to order or if we can just tell home nurse

## 2020-04-18 NOTE — Telephone Encounter (Signed)
Spoke with Almyra Free and informed her of the message below.

## 2020-04-18 NOTE — Telephone Encounter (Signed)
Spoke with  Almyra Free and she stated in this case since the patient is under Hospice care, the Rx for a flu shot can be sent to the pharmacy and the family member will pick this up for her to administer.  Almyra Free stated she will let the pts girlfriend know and to please send the order to [facility].  Message sent to PCP.

## 2020-04-18 NOTE — Telephone Encounter (Signed)
Noted  

## 2020-04-18 NOTE — Telephone Encounter (Signed)
Almyra Free called back and stated the pts girlfriend is anxious as the Rx for the flu shot has not been sent to the pharmacy yet.  I advised Almyra Free the note has been sent and the PCP is seeing patients at this time and she agreed.

## 2020-04-18 NOTE — Telephone Encounter (Signed)
The patients wife contacted the office about a flu shot Rx being sent to the pharmacy for the wife to pick up and the home health nurse give to the patient.  I spoke with Dr. Ethlyn Gallery and she is fine with them giving flu shot but she has never done this before but she'll have JoAnne look into it.

## 2020-04-19 DIAGNOSIS — C062 Malignant neoplasm of retromolar area: Secondary | ICD-10-CM | POA: Diagnosis not present

## 2020-04-19 DIAGNOSIS — C9110 Chronic lymphocytic leukemia of B-cell type not having achieved remission: Secondary | ICD-10-CM | POA: Diagnosis not present

## 2020-04-19 DIAGNOSIS — R1310 Dysphagia, unspecified: Secondary | ICD-10-CM | POA: Diagnosis not present

## 2020-04-19 DIAGNOSIS — R634 Abnormal weight loss: Secondary | ICD-10-CM | POA: Diagnosis not present

## 2020-04-19 DIAGNOSIS — I714 Abdominal aortic aneurysm, without rupture: Secondary | ICD-10-CM | POA: Diagnosis not present

## 2020-04-19 DIAGNOSIS — Z8551 Personal history of malignant neoplasm of bladder: Secondary | ICD-10-CM | POA: Diagnosis not present

## 2020-04-20 ENCOUNTER — Other Ambulatory Visit: Payer: Self-pay | Admitting: Family Medicine

## 2020-04-23 DIAGNOSIS — C062 Malignant neoplasm of retromolar area: Secondary | ICD-10-CM | POA: Diagnosis not present

## 2020-04-23 DIAGNOSIS — I714 Abdominal aortic aneurysm, without rupture: Secondary | ICD-10-CM | POA: Diagnosis not present

## 2020-04-23 DIAGNOSIS — C9110 Chronic lymphocytic leukemia of B-cell type not having achieved remission: Secondary | ICD-10-CM | POA: Diagnosis not present

## 2020-04-23 DIAGNOSIS — R634 Abnormal weight loss: Secondary | ICD-10-CM | POA: Diagnosis not present

## 2020-04-23 DIAGNOSIS — Z8551 Personal history of malignant neoplasm of bladder: Secondary | ICD-10-CM | POA: Diagnosis not present

## 2020-04-23 DIAGNOSIS — M1990 Unspecified osteoarthritis, unspecified site: Secondary | ICD-10-CM | POA: Diagnosis not present

## 2020-04-23 DIAGNOSIS — I951 Orthostatic hypotension: Secondary | ICD-10-CM | POA: Diagnosis not present

## 2020-04-23 DIAGNOSIS — S81812D Laceration without foreign body, left lower leg, subsequent encounter: Secondary | ICD-10-CM | POA: Diagnosis not present

## 2020-04-23 DIAGNOSIS — H409 Unspecified glaucoma: Secondary | ICD-10-CM | POA: Diagnosis not present

## 2020-04-23 DIAGNOSIS — R1310 Dysphagia, unspecified: Secondary | ICD-10-CM | POA: Diagnosis not present

## 2020-04-23 DIAGNOSIS — I10 Essential (primary) hypertension: Secondary | ICD-10-CM | POA: Diagnosis not present

## 2020-04-23 DIAGNOSIS — H3530 Unspecified macular degeneration: Secondary | ICD-10-CM | POA: Diagnosis not present

## 2020-04-23 DIAGNOSIS — Z85820 Personal history of malignant melanoma of skin: Secondary | ICD-10-CM | POA: Diagnosis not present

## 2020-04-23 DIAGNOSIS — M75101 Unspecified rotator cuff tear or rupture of right shoulder, not specified as traumatic: Secondary | ICD-10-CM | POA: Diagnosis not present

## 2020-04-23 DIAGNOSIS — M4802 Spinal stenosis, cervical region: Secondary | ICD-10-CM | POA: Diagnosis not present

## 2020-04-25 ENCOUNTER — Telehealth (INDEPENDENT_AMBULATORY_CARE_PROVIDER_SITE_OTHER): Payer: Medicare Other | Admitting: Family Medicine

## 2020-04-25 ENCOUNTER — Other Ambulatory Visit: Payer: Self-pay

## 2020-04-25 DIAGNOSIS — C148 Malignant neoplasm of overlapping sites of lip, oral cavity and pharynx: Secondary | ICD-10-CM | POA: Diagnosis not present

## 2020-04-25 DIAGNOSIS — D539 Nutritional anemia, unspecified: Secondary | ICD-10-CM | POA: Diagnosis not present

## 2020-04-25 DIAGNOSIS — R638 Other symptoms and signs concerning food and fluid intake: Secondary | ICD-10-CM | POA: Diagnosis not present

## 2020-04-25 DIAGNOSIS — Z8551 Personal history of malignant neoplasm of bladder: Secondary | ICD-10-CM

## 2020-04-25 DIAGNOSIS — E43 Unspecified severe protein-calorie malnutrition: Secondary | ICD-10-CM

## 2020-04-25 DIAGNOSIS — R531 Weakness: Secondary | ICD-10-CM

## 2020-04-25 DIAGNOSIS — E039 Hypothyroidism, unspecified: Secondary | ICD-10-CM

## 2020-04-25 DIAGNOSIS — C9110 Chronic lymphocytic leukemia of B-cell type not having achieved remission: Secondary | ICD-10-CM

## 2020-04-25 NOTE — Progress Notes (Signed)
Virtual Visit via Telephone Note  I connected with Timothy Kim  on 04/25/20 at  1:00 PM EDT by telephone and verified that I am speaking with the correct person using two identifiers.   I discussed the limitations, risks, security and privacy concerns of performing an evaluation and management service by telephone and the availability of in person appointments. I also discussed with the patient that there may be a patient responsible charge related to this service. The patient expressed understanding and agreed to proceed.  Location patient: home Location provider:  [facility]  [place], [place] [address]  Participants present for the call: patient, provider Patient did not have a visit in the prior 7 days to address this/these issue(s).   History of Present Illness: Caregiver,Timothy Kim, initially answers phone and states that things are not going so well.  We had referred Timothy Kim to home hospice 1 month ago.  He has had multiple medical conditions over the years and his condition had taken a gradual decline especially in the last year.  Can't hold phone to head; can't hold phone, can't walk. Completely bed and care bound. Cannot get out of house. Strength has just gradually diminished his ability to move about.  Up until a few months ago, he was able to use a walker to get around the house, but gradually started to lose strength and has not been able to do this.  Hospice comes out twice a week.  He feels that all of his needs are being met currently.  He has no concerns about pain.  He just notes that he is feeling weaker.  Timothy Kim is caring for him; changing him and preparing and helping feed him. She feels like she is doing ok with this right now.  When asked, she does not feel that she needs any other assistance with helping to bathe him or care for him.  She feels she is doing okay right now and he is able to assist to the degree where he can sit up with  assistance and she can bathe him in a wheelchair.  He reminds me that he has had 4 cancers - jaw, which he states may or may not be coming back.  His jaws not properly aligned and he has significant dysphagia.  This significantly limits when he is able to take in orally.  All foods are liquid or pured.  He does have difficulty with swallowing pills, even when crushed.  He states that because he cannot eat normal foods, he feels his nutrition is poor.  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 Doesn't hurt unless he accidentally twists. Laying doesn't bother him and sitting doesn't bother him. He can sit with assistance.   Home nurse states she has seen white and red splotches in back of mouth.  He states that he has no pain in his mouth or in his throat.  States that his days are numbered.  He also states that he is very comfortable with this idea and is ready to go.  Numbness came on in right hand first. 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 Now hard to hold things in left hand. Spills, drops things, can't turn pages in newspaper. Right leg is also numb, foot is numb. This started a few months ago.   Late in evening back of neck starts to hurt. Takes tylenol and this helps with pain.   bp has been normal for him - 148/80. He  is taking the midodrine TID.   Bowels variable. Takes laxative every other day.   No muscle spasms or cramps.    He is urinating without difficulty. Observations/Objective: Patient sounds cheerful and well on the phone.  He sounds comfortable and at peace. I do not appreciate any SOB. Speech and thought processing are grossly intact. Patient reported vitals: Most recent blood pressure that he can remember was 148/80.  He is on midodrine to maintain blood pressure.  Assessment and Plan:  Patient is actively in hospice care.  Condition is stable but prognosis is poor.  1. Acquired hypothyroidism Not currently taking any medication.  Last TSH was normal.   We did discuss option of follow-up blood work, but he declines at the present time.  2. CLL (chronic lymphocytic leukemia) ([place]) Last blood work was 3 months ago.  Not following regularly at this point with specialist.  3. Severe protein-calorie malnutrition ([place]), BMI 17 He is taking in pured and liquid nutrition.  He is comfortable with current intake, but we did discuss that if desired we could do further nutritional evaluation with blood work to offer suggestions.  4. History of bladder cancer No difficulty with urination currently.  5. Weakness generalized He is essentially wheelchair or bedbound.  Weakness is worsening.  6. Macrocytic anemia Consider rechecking blood work if patient desires, likely minimal benefit to this given present condition, but we did discuss potentially rechecking vitamin levels.  7. Decreased oral intake He is taking in nutritional supplements as tolerated.  8. Cancer of the lip, oral cavity, and pharynx (H[facility]) No pain in the mouth or throat.   Follow Up Instructions: I discussed with the patient today that any further management is really his call.  He is comfortable, without pain, and receiving around-the-clock care from his significant other.  He has hospice nurse coming in twice a week to help with other care needs.  We discussed that if needed, we can bring in additional help for bathing or food prep.  I feel as long as he is comfortable with home hospice care, he can continue to be treated and cared for in the comfort of his own home without significant medical interventions.  He expresses readiness for death, and is comfortable with this concept.    [id number] [id number] [id number] 21-30 I did not refer this patient for an OV in the next 24 hours for this/these issue(s).  I discussed the assessment and treatment plan with the patient. The patient was provided an opportunity to ask questions and all were answered. The patient agreed with the plan  and demonstrated an understanding of the instructions.   The patient was advised to call back or seek an in-person evaluation if the symptoms worsen or if the condition fails to improve as anticipated.  I provided 20 minutes of non-face-to-face time during this encounter.   Micheline Rough, MD

## 2020-04-26 DIAGNOSIS — C062 Malignant neoplasm of retromolar area: Secondary | ICD-10-CM | POA: Diagnosis not present

## 2020-04-26 DIAGNOSIS — R634 Abnormal weight loss: Secondary | ICD-10-CM | POA: Diagnosis not present

## 2020-04-26 DIAGNOSIS — I714 Abdominal aortic aneurysm, without rupture: Secondary | ICD-10-CM | POA: Diagnosis not present

## 2020-04-26 DIAGNOSIS — Z8551 Personal history of malignant neoplasm of bladder: Secondary | ICD-10-CM | POA: Diagnosis not present

## 2020-04-26 DIAGNOSIS — C9110 Chronic lymphocytic leukemia of B-cell type not having achieved remission: Secondary | ICD-10-CM | POA: Diagnosis not present

## 2020-04-26 DIAGNOSIS — R1310 Dysphagia, unspecified: Secondary | ICD-10-CM | POA: Diagnosis not present

## 2020-04-27 DIAGNOSIS — R1310 Dysphagia, unspecified: Secondary | ICD-10-CM | POA: Diagnosis not present

## 2020-04-27 DIAGNOSIS — R634 Abnormal weight loss: Secondary | ICD-10-CM | POA: Diagnosis not present

## 2020-04-27 DIAGNOSIS — C062 Malignant neoplasm of retromolar area: Secondary | ICD-10-CM | POA: Diagnosis not present

## 2020-04-27 DIAGNOSIS — Z8551 Personal history of malignant neoplasm of bladder: Secondary | ICD-10-CM | POA: Diagnosis not present

## 2020-04-27 DIAGNOSIS — C9110 Chronic lymphocytic leukemia of B-cell type not having achieved remission: Secondary | ICD-10-CM | POA: Diagnosis not present

## 2020-04-27 DIAGNOSIS — I714 Abdominal aortic aneurysm, without rupture: Secondary | ICD-10-CM | POA: Diagnosis not present

## 2020-04-30 DIAGNOSIS — C062 Malignant neoplasm of retromolar area: Secondary | ICD-10-CM | POA: Diagnosis not present

## 2020-04-30 DIAGNOSIS — Z8551 Personal history of malignant neoplasm of bladder: Secondary | ICD-10-CM | POA: Diagnosis not present

## 2020-04-30 DIAGNOSIS — R634 Abnormal weight loss: Secondary | ICD-10-CM | POA: Diagnosis not present

## 2020-04-30 DIAGNOSIS — C9110 Chronic lymphocytic leukemia of B-cell type not having achieved remission: Secondary | ICD-10-CM | POA: Diagnosis not present

## 2020-04-30 DIAGNOSIS — R1310 Dysphagia, unspecified: Secondary | ICD-10-CM | POA: Diagnosis not present

## 2020-04-30 DIAGNOSIS — I714 Abdominal aortic aneurysm, without rupture: Secondary | ICD-10-CM | POA: Diagnosis not present

## 2020-05-03 DIAGNOSIS — C9110 Chronic lymphocytic leukemia of B-cell type not having achieved remission: Secondary | ICD-10-CM | POA: Diagnosis not present

## 2020-05-03 DIAGNOSIS — Z8551 Personal history of malignant neoplasm of bladder: Secondary | ICD-10-CM | POA: Diagnosis not present

## 2020-05-03 DIAGNOSIS — R1310 Dysphagia, unspecified: Secondary | ICD-10-CM | POA: Diagnosis not present

## 2020-05-03 DIAGNOSIS — I714 Abdominal aortic aneurysm, without rupture: Secondary | ICD-10-CM | POA: Diagnosis not present

## 2020-05-03 DIAGNOSIS — R634 Abnormal weight loss: Secondary | ICD-10-CM | POA: Diagnosis not present

## 2020-05-03 DIAGNOSIS — C062 Malignant neoplasm of retromolar area: Secondary | ICD-10-CM | POA: Diagnosis not present

## 2020-05-07 DIAGNOSIS — C9110 Chronic lymphocytic leukemia of B-cell type not having achieved remission: Secondary | ICD-10-CM | POA: Diagnosis not present

## 2020-05-07 DIAGNOSIS — R634 Abnormal weight loss: Secondary | ICD-10-CM | POA: Diagnosis not present

## 2020-05-07 DIAGNOSIS — Z8551 Personal history of malignant neoplasm of bladder: Secondary | ICD-10-CM | POA: Diagnosis not present

## 2020-05-07 DIAGNOSIS — I714 Abdominal aortic aneurysm, without rupture: Secondary | ICD-10-CM | POA: Diagnosis not present

## 2020-05-07 DIAGNOSIS — R1310 Dysphagia, unspecified: Secondary | ICD-10-CM | POA: Diagnosis not present

## 2020-05-07 DIAGNOSIS — C062 Malignant neoplasm of retromolar area: Secondary | ICD-10-CM | POA: Diagnosis not present

## 2020-05-09 ENCOUNTER — Other Ambulatory Visit: Payer: Self-pay

## 2020-05-09 ENCOUNTER — Ambulatory Visit: Payer: Medicare Other

## 2020-05-10 DIAGNOSIS — C062 Malignant neoplasm of retromolar area: Secondary | ICD-10-CM | POA: Diagnosis not present

## 2020-05-10 DIAGNOSIS — C9110 Chronic lymphocytic leukemia of B-cell type not having achieved remission: Secondary | ICD-10-CM | POA: Diagnosis not present

## 2020-05-10 DIAGNOSIS — R634 Abnormal weight loss: Secondary | ICD-10-CM | POA: Diagnosis not present

## 2020-05-10 DIAGNOSIS — Z8551 Personal history of malignant neoplasm of bladder: Secondary | ICD-10-CM | POA: Diagnosis not present

## 2020-05-10 DIAGNOSIS — I714 Abdominal aortic aneurysm, without rupture: Secondary | ICD-10-CM | POA: Diagnosis not present

## 2020-05-10 DIAGNOSIS — R1310 Dysphagia, unspecified: Secondary | ICD-10-CM | POA: Diagnosis not present

## 2020-05-15 DIAGNOSIS — R1310 Dysphagia, unspecified: Secondary | ICD-10-CM | POA: Diagnosis not present

## 2020-05-15 DIAGNOSIS — R634 Abnormal weight loss: Secondary | ICD-10-CM | POA: Diagnosis not present

## 2020-05-15 DIAGNOSIS — I714 Abdominal aortic aneurysm, without rupture: Secondary | ICD-10-CM | POA: Diagnosis not present

## 2020-05-15 DIAGNOSIS — C062 Malignant neoplasm of retromolar area: Secondary | ICD-10-CM | POA: Diagnosis not present

## 2020-05-15 DIAGNOSIS — Z8551 Personal history of malignant neoplasm of bladder: Secondary | ICD-10-CM | POA: Diagnosis not present

## 2020-05-15 DIAGNOSIS — C9110 Chronic lymphocytic leukemia of B-cell type not having achieved remission: Secondary | ICD-10-CM | POA: Diagnosis not present

## 2020-05-16 DIAGNOSIS — R1310 Dysphagia, unspecified: Secondary | ICD-10-CM | POA: Diagnosis not present

## 2020-05-16 DIAGNOSIS — R634 Abnormal weight loss: Secondary | ICD-10-CM | POA: Diagnosis not present

## 2020-05-16 DIAGNOSIS — Z8551 Personal history of malignant neoplasm of bladder: Secondary | ICD-10-CM | POA: Diagnosis not present

## 2020-05-16 DIAGNOSIS — I714 Abdominal aortic aneurysm, without rupture: Secondary | ICD-10-CM | POA: Diagnosis not present

## 2020-05-16 DIAGNOSIS — C9110 Chronic lymphocytic leukemia of B-cell type not having achieved remission: Secondary | ICD-10-CM | POA: Diagnosis not present

## 2020-05-16 DIAGNOSIS — C062 Malignant neoplasm of retromolar area: Secondary | ICD-10-CM | POA: Diagnosis not present

## 2020-05-18 DIAGNOSIS — R634 Abnormal weight loss: Secondary | ICD-10-CM | POA: Diagnosis not present

## 2020-05-18 DIAGNOSIS — R1310 Dysphagia, unspecified: Secondary | ICD-10-CM | POA: Diagnosis not present

## 2020-05-18 DIAGNOSIS — C9110 Chronic lymphocytic leukemia of B-cell type not having achieved remission: Secondary | ICD-10-CM | POA: Diagnosis not present

## 2020-05-18 DIAGNOSIS — Z8551 Personal history of malignant neoplasm of bladder: Secondary | ICD-10-CM | POA: Diagnosis not present

## 2020-05-18 DIAGNOSIS — I714 Abdominal aortic aneurysm, without rupture: Secondary | ICD-10-CM | POA: Diagnosis not present

## 2020-05-18 DIAGNOSIS — C062 Malignant neoplasm of retromolar area: Secondary | ICD-10-CM | POA: Diagnosis not present

## 2020-05-19 ENCOUNTER — Telehealth: Payer: Self-pay | Admitting: Family Medicine

## 2020-05-19 NOTE — Telephone Encounter (Signed)
Received notice that this patient died yesterday May 26, 2023 under hospice care.  Message to PCP

## 2020-05-23 NOTE — Telephone Encounter (Signed)
Noted. Death certificate completed today.

## 2020-05-23 DEATH — deceased

## 2020-06-04 ENCOUNTER — Telehealth: Payer: Self-pay | Admitting: Family Medicine

## 2020-06-04 NOTE — Telephone Encounter (Signed)
Tried calling patient to  schedule Medicare Annual Wellness Visit (AWV) either virtually or in office.  No answer    Last AWV  08/12/17  please schedule at anytime with [facility] Nurse Health Advisor 1 or 2   This should be a 45 minute visit. 

## 2020-06-04 NOTE — Telephone Encounter (Signed)
error 

## 2020-07-16 ENCOUNTER — Other Ambulatory Visit: Payer: Self-pay | Admitting: Family Medicine

## 2020-08-23 ENCOUNTER — Telehealth: Payer: Self-pay | Admitting: Family Medicine

## 2020-08-23 NOTE — Telephone Encounter (Signed)
Tried calling patient to  schedule Medicare Annual Wellness Visit (AWV) either virtually or in office.  No answer    Last AWV  08/12/17  please schedule at anytime with [facility] Nurse Health Advisor 1 or 2   This should be a 45 minute visit.

## 2020-11-29 ENCOUNTER — Telehealth: Payer: Self-pay | Admitting: Family Medicine

## 2020-11-29 NOTE — Telephone Encounter (Signed)
Patient is deceased as of May 21, 2020

## 2020-11-29 NOTE — Telephone Encounter (Signed)
Left message for patient to call back and schedule Medicare Annual Wellness Visit (AWV) either virtually or in office.   Last AWV 08/12/17 Please schedule at anytime with [facility] Nurse Health Advisor 1 or 2   This should be a 45 minute visit.

## 2020-11-29 NOTE — Telephone Encounter (Signed)
Larene [place] Can you mark patient deceased Thank you
